# Patient Record
Sex: Female | Born: 1965 | Race: White | Hispanic: No | Smoking: Former smoker
Health system: Southern US, Community
[De-identification: ages and names within clinical notes are randomized; demographics above are authoritative.]

## PROBLEM LIST (undated history)

## (undated) DIAGNOSIS — T451X5A Adverse effect of antineoplastic and immunosuppressive drugs, initial encounter: Secondary | ICD-10-CM

## (undated) DIAGNOSIS — F419 Anxiety disorder, unspecified: Secondary | ICD-10-CM

## (undated) DIAGNOSIS — F32A Depression, unspecified: Secondary | ICD-10-CM

## (undated) DIAGNOSIS — H269 Unspecified cataract: Secondary | ICD-10-CM

## (undated) DIAGNOSIS — M7551 Bursitis of right shoulder: Secondary | ICD-10-CM

## (undated) DIAGNOSIS — Z9221 Personal history of antineoplastic chemotherapy: Secondary | ICD-10-CM

## (undated) DIAGNOSIS — E559 Vitamin D deficiency, unspecified: Secondary | ICD-10-CM

## (undated) DIAGNOSIS — C801 Malignant (primary) neoplasm, unspecified: Secondary | ICD-10-CM

## (undated) DIAGNOSIS — Z170 Estrogen receptor positive status [ER+]: Secondary | ICD-10-CM

## (undated) DIAGNOSIS — C50919 Malignant neoplasm of unspecified site of unspecified female breast: Secondary | ICD-10-CM

## (undated) DIAGNOSIS — K219 Gastro-esophageal reflux disease without esophagitis: Secondary | ICD-10-CM

## (undated) DIAGNOSIS — G4700 Insomnia, unspecified: Secondary | ICD-10-CM

## (undated) DIAGNOSIS — IMO0001 Reserved for inherently not codable concepts without codable children: Secondary | ICD-10-CM

## (undated) DIAGNOSIS — F329 Major depressive disorder, single episode, unspecified: Secondary | ICD-10-CM

## (undated) DIAGNOSIS — M545 Low back pain, unspecified: Secondary | ICD-10-CM

## (undated) DIAGNOSIS — R112 Nausea with vomiting, unspecified: Secondary | ICD-10-CM

## (undated) DIAGNOSIS — C50511 Malignant neoplasm of lower-outer quadrant of right female breast: Secondary | ICD-10-CM

## (undated) DIAGNOSIS — Z923 Personal history of irradiation: Secondary | ICD-10-CM

## (undated) DIAGNOSIS — D649 Anemia, unspecified: Secondary | ICD-10-CM

## (undated) HISTORY — DX: Low back pain, unspecified: M54.50

## (undated) HISTORY — DX: Bursitis of right shoulder: M75.51

## (undated) HISTORY — DX: Vitamin D deficiency, unspecified: E55.9

## (undated) HISTORY — PX: ENDOMETRIAL ABLATION: SHX621

## (undated) HISTORY — DX: Depression, unspecified: F32.A

## (undated) HISTORY — DX: Nausea with vomiting, unspecified: R11.2

## (undated) HISTORY — DX: Estrogen receptor positive status (ER+): C50.511

## (undated) HISTORY — PX: ECTOPIC PREGNANCY SURGERY: SHX613

## (undated) HISTORY — DX: Gastro-esophageal reflux disease without esophagitis: K21.9

## (undated) HISTORY — PX: BILATERAL SALPINGECTOMY: SHX5743

## (undated) HISTORY — DX: Low back pain: M54.5

## (undated) HISTORY — DX: Insomnia, unspecified: G47.00

## (undated) HISTORY — DX: Unspecified cataract: H26.9

## (undated) HISTORY — DX: Malignant neoplasm of lower-outer quadrant of right female breast: Z17.0

## (undated) HISTORY — DX: Major depressive disorder, single episode, unspecified: F32.9

## (undated) HISTORY — DX: Anxiety disorder, unspecified: F41.9

## (undated) HISTORY — PX: TUBAL LIGATION: SHX77

## (undated) HISTORY — DX: Adverse effect of antineoplastic and immunosuppressive drugs, initial encounter: T45.1X5A

---

## 1998-04-07 ENCOUNTER — Other Ambulatory Visit: Admission: RE | Admit: 1998-04-07 | Discharge: 1998-04-07 | Payer: Self-pay | Admitting: Obstetrics & Gynecology

## 2005-07-05 ENCOUNTER — Other Ambulatory Visit: Payer: Self-pay

## 2005-07-05 ENCOUNTER — Emergency Department: Payer: Self-pay | Admitting: Internal Medicine

## 2006-08-22 ENCOUNTER — Encounter (INDEPENDENT_AMBULATORY_CARE_PROVIDER_SITE_OTHER): Payer: Self-pay | Admitting: Internal Medicine

## 2006-08-22 LAB — CONVERTED CEMR LAB

## 2006-09-20 ENCOUNTER — Encounter (INDEPENDENT_AMBULATORY_CARE_PROVIDER_SITE_OTHER): Payer: Self-pay | Admitting: Internal Medicine

## 2006-09-20 LAB — CONVERTED CEMR LAB
RBC count: 4.59 10*6/uL
WBC, blood: 6.6 10*3/uL

## 2006-12-09 ENCOUNTER — Ambulatory Visit: Payer: Self-pay | Admitting: Unknown Physician Specialty

## 2006-12-22 ENCOUNTER — Encounter (INDEPENDENT_AMBULATORY_CARE_PROVIDER_SITE_OTHER): Payer: Self-pay | Admitting: Internal Medicine

## 2006-12-22 LAB — CONVERTED CEMR LAB: Blood Glucose, Fasting: 100 mg/dL

## 2006-12-26 ENCOUNTER — Ambulatory Visit: Payer: Self-pay | Admitting: Family Medicine

## 2007-01-24 ENCOUNTER — Ambulatory Visit: Payer: Self-pay | Admitting: Family Medicine

## 2007-03-13 ENCOUNTER — Ambulatory Visit: Payer: Self-pay | Admitting: Internal Medicine

## 2007-08-16 ENCOUNTER — Ambulatory Visit: Payer: Self-pay | Admitting: Family Medicine

## 2007-08-16 DIAGNOSIS — E66812 Obesity, class 2: Secondary | ICD-10-CM | POA: Insufficient documentation

## 2007-08-16 DIAGNOSIS — G4700 Insomnia, unspecified: Secondary | ICD-10-CM | POA: Insufficient documentation

## 2007-08-16 DIAGNOSIS — E669 Obesity, unspecified: Secondary | ICD-10-CM

## 2007-08-17 ENCOUNTER — Encounter: Payer: Self-pay | Admitting: Internal Medicine

## 2007-08-17 DIAGNOSIS — Z862 Personal history of diseases of the blood and blood-forming organs and certain disorders involving the immune mechanism: Secondary | ICD-10-CM | POA: Insufficient documentation

## 2007-08-17 DIAGNOSIS — N939 Abnormal uterine and vaginal bleeding, unspecified: Secondary | ICD-10-CM

## 2007-08-17 DIAGNOSIS — N926 Irregular menstruation, unspecified: Secondary | ICD-10-CM

## 2007-08-21 ENCOUNTER — Telehealth (INDEPENDENT_AMBULATORY_CARE_PROVIDER_SITE_OTHER): Payer: Self-pay | Admitting: *Deleted

## 2007-08-29 ENCOUNTER — Telehealth (INDEPENDENT_AMBULATORY_CARE_PROVIDER_SITE_OTHER): Payer: Self-pay | Admitting: *Deleted

## 2007-09-18 ENCOUNTER — Encounter (INDEPENDENT_AMBULATORY_CARE_PROVIDER_SITE_OTHER): Payer: Self-pay | Admitting: *Deleted

## 2007-09-27 ENCOUNTER — Ambulatory Visit: Payer: Self-pay | Admitting: Family Medicine

## 2007-11-28 ENCOUNTER — Ambulatory Visit: Payer: Self-pay | Admitting: Family Medicine

## 2007-11-28 DIAGNOSIS — R42 Dizziness and giddiness: Secondary | ICD-10-CM

## 2008-01-11 ENCOUNTER — Ambulatory Visit: Payer: Self-pay | Admitting: Unknown Physician Specialty

## 2008-07-05 ENCOUNTER — Telehealth: Payer: Self-pay | Admitting: Family Medicine

## 2008-11-25 ENCOUNTER — Telehealth (INDEPENDENT_AMBULATORY_CARE_PROVIDER_SITE_OTHER): Payer: Self-pay | Admitting: Internal Medicine

## 2009-02-12 ENCOUNTER — Ambulatory Visit: Payer: Self-pay | Admitting: Unknown Physician Specialty

## 2009-05-27 ENCOUNTER — Emergency Department: Payer: Self-pay | Admitting: Internal Medicine

## 2010-03-06 ENCOUNTER — Ambulatory Visit: Payer: Self-pay | Admitting: Unknown Physician Specialty

## 2010-12-28 ENCOUNTER — Emergency Department: Payer: Self-pay | Admitting: Emergency Medicine

## 2011-04-09 NOTE — Assessment & Plan Note (Signed)
[facility]                           [place] OFFICE NOTE   Shelby, Barry                       MRN:          [id number]  DATE:12/26/2006                            DOB:          [birth year]    Shelby Barry is a 45 year old white female who comes to establish with the  practice due to a history of anemia and shortness of breath for the past  2 weeks.  She indicates that she has had anemia off and on for the past  20 years.   She is seeing Dr. Arvil Chaco and has had increased spotting for the past  7-8 years.  She is scheduled for a Her Option, which is a freezing  procedure of the endometrium of the uterus, in 2 weeks.  It is hoped  that this will stop her continual spotting.  Her next appointment there  is on February 6 in prep for surgery.   She indicates that she has had fatigue, especially on Saturdays and  Sundays of each week.  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  Indicates  that she is not tired when she goes to work on Saturday but is quite  tired by the end of her two shifts.  She also indicates that her weight  is depressing her.  She becomes anxious, has been on Wellbutrin 150 mg  for one month from Dr. Arvil Chaco.  She is unsure of the response.  She  does indicate that with increased stress at work she becomes very  anxious.   She has had no primary care physician with the exception of Dr. Arvil Chaco for a number of years.   CURRENT MEDICATIONS:  1. Fish oil 1000 mg two to three a day.  2. Prenatal vitamin two a day.  3. Iron 65 mg two daily.   ALLERGIES:  No known.   PAST MEDICAL HISTORY:  1. Significant for an abnormal Pap in 1992, at which time she had a      conization and has had a negative Pap recently.  2. Anemia long-term as discussed above.  3. Reflux.   Surgeries other than her conization has been a uterine procedure in  January 2008, she is  not sure what that was, and a different procedure  in 1994.  She had bilateral tubal ligation in September 1986.  She has  been hospitalized for childbirth only.   SOCIAL HISTORY:  She is married and has 2 adult children, ages 11 and  58.  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  She is  no exercise program.  Stopped smoking a year and a half ago.  Uses  alcohol sporadically, which may mean 2-3 mixed drinks on an evening  approximately every 3 months, and occasional beer.  She does not use  street drugs.   REVIEW OF SYSTEMS:  She denies any HEENT, cardiac, GU, allergic rhinitis  and thromboses.  She does wear reading glasses, which are by  prescription.  Her last eye exam was in 2007, at which time she did not  need a new prescription.  She was negative for glaucoma and cataracts.  RESPIRATORY:  She thinks she remembers exercise-induced asthma and  currently becomes short of breath.  GI:  She has reflux and has used OTC  Prilosec 20 mg, which helps.  Zantac helped sometimes but she generally  uses 1-4 Tums daily.  She has never had a workup.  She had had no  endoscopy or colonoscopy.  GYNECOLOGIC:  She is a gravida 3, para 2,  having had a tubal pregnancy post bilateral tubal ligation.  Her last  Pap smear was December 2007 and a mammogram in January 2008, both of  which were negative.  Her pregnancies were vaginal deliveries with no  complications.  MUSCULOSKELETAL:  She indicates no fracture but does see  a chiropractor approximately once a month due to back pain from work.   FAMILY HISTORY:  Father died at age 40 of suicide.  He had known drug  and alcohol abuse, elevated lipids, heart disease, hypertension and  diabetes.  Her mother is living at age 34 with no known medical  problems.  Her maternal grandmother is living at age 35 with some GI  problems but otherwise well.  She has one brother living at age 90, who  has drug and alcohol abuse problems.  She has  two sisters living, one at  age 28 had a myocardial infarction and the other is living and well.   For questions regarding the extended family, I find that her paternal  grandmother had a stroke at age 85.  Her paternal grandmother was also  diabetic, as was a paternal uncle and numerous paternal cousins.  To her  knowledge, there is no cancer in the family.  She does also have a  maternal uncle who has had drug and alcohol problems.   IMMUNIZATION RECORDS:  She is unclear regarding her tetanus and  pneumonia status.  She does know that she has had the hepatitis B  vaccine.   PHYSICAL EXAMINATION:  VITAL SIGNS:  Blood pressure 119/74, temperature  98.0, pulse is 74, weight 193, height 5 feet 1-1/2 inches.  GENERAL:  Obese white female in no acute distress.  CHEST:  Clear throughout.  No rales, rhonchi or wheezes.  HEART:  Rate and rhythm regular without murmurs, gallops or rubs.  No  carotid bruits.  No pretibial edema.  MUSCULOSKELETAL:  Muscle mass is equal, upper and lower extremities.  Gait and station within normal limits.  SKIN:  Without obvious lesions.  PSYCHIATRIC:  Obviously anxious in the exam room and by history.   ASSESSMENT:  1. History of anemia, fatigue and obesity.  Will get her records from      Dr. Arvil Chaco to include labs.  I have asked her to walk 5 times a      week at a slow pace and to reduce her white flour consumption.  2. Anxiety.  Will start her on BuSpar 5 mg one a day.  She will call      response in 2 weeks, and we will titrate upward as needed (#60 with      one refill).  We will see her back in one month.      Billie D. Bean, FNP  Electronically Signed      Arta Silence, MD  Electronically Signed   BDB/MedQ  DD: 12/28/2006  DT: 12/28/2006  Job #: [id number]

## 2011-04-21 ENCOUNTER — Ambulatory Visit: Payer: Self-pay | Admitting: Unknown Physician Specialty

## 2012-02-21 LAB — HM MAMMOGRAPHY: HM MAMMO: NORMAL

## 2012-04-04 ENCOUNTER — Ambulatory Visit: Payer: Self-pay | Admitting: Ophthalmology

## 2013-03-16 LAB — HM PAP SMEAR: HM PAP: NORMAL

## 2013-04-09 ENCOUNTER — Ambulatory Visit: Payer: Self-pay | Admitting: Obstetrics & Gynecology

## 2013-04-09 LAB — CBC
HCT: 40.1 % (ref 35.0–47.0)
RDW: 14 % (ref 11.5–14.5)
WBC: 6.5 10*3/uL (ref 3.6–11.0)

## 2013-04-17 ENCOUNTER — Ambulatory Visit: Payer: Self-pay | Admitting: Obstetrics & Gynecology

## 2013-04-18 LAB — HEMOGLOBIN: HGB: 12.6 g/dL (ref 12.0–16.0)

## 2013-04-18 LAB — PATHOLOGY REPORT

## 2014-04-20 HISTORY — PX: ABDOMINAL HYSTERECTOMY: SHX81

## 2014-11-13 ENCOUNTER — Ambulatory Visit: Payer: Self-pay | Admitting: Ophthalmology

## 2015-03-14 NOTE — Op Note (Signed)
PATIENT NAME:  [name], Shelby Barry MR#:  [id number] DATE OF BIRTH:  [birth year]  DATE OF PROCEDURE:  04/17/2013  PREOPERATIVE DIAGNOSIS: Menorrhagia.  POSTOPERATIVE DIAGNOSES: Menorrhagia and endometriosis.   PROCEDURE: Total laparoscopic hysterectomy with bilateral salpingo-oophorectomy.   SURGEON: Glean Salen, MD  ASSISTANT: Stoney Bang. Georgianne Fick, MD  ANESTHESIA: General.   ESTIMATED BLOOD LOSS: 50 mL.   COMPLICATIONS: None.   FINDINGS: Endometriosis noted around bilateral adnexa and posterior left ovarian fossa and the peritoneal surface.   DISPOSITION: To recovery room in stable condition.   TECHNIQUE: The patient is prepped and draped in the usual sterile fashion after adequate anesthesia is obtained in the dorsal lithotomy position. A bladder catheter is inserted. A VCare device is placed on the cervix for manipulation purposes after uterus is sounded to 8 cm.   Attention is then turned to the abdomen, where a Veress needle is inserted through a 5 mm infraumbilical incision after Marcaine is used to anesthetize the skin. Veress needle placement is confirmed using the hanging drop technique, and the abdomen is then insufflated with CO2 gas. A 5 mm trocar is then inserted under direct visualization with the laparoscope, with no injuries or bleeding noted. The patient is placed in Trendelenburg position, and the above-mentioned findings are visualized.   The left and right trocars were placed, with a 5 mm placed in the left lower quadrant and an 11 mm placed in the right lower quadrant lateral to the inferior epigastric blood vessels, with no injuries or bleeding noted. Adhesions that are on the anterior abdominal wall to the omentum and around the left adnexa are carefully dissected using the 5 mm Harmonic scalpel. There is no apparent injury to bowel, bladder, ureter or other structures, and these are out of harm's way throughout the procedure.   The infundibulopelvic blood vessels and  ligaments are carefully coagulated and cut using the bipolar cautery device as well as the Harmonic scalpel to dissect the uterus, tubes and ovaries free. The broad ligament is dissected down to the level of the uterine arteries, which are carefully coagulated and then cut. The VCare device is identified through the cervicovaginal junction tenting through the tissues, and a circumferential incision is made to completely amputate the uterus and cervix along with the adnexa, and it is removed vaginally.   Vaginal cuff is closed with interrupted Polysorb sutures using the Endo Stitch device, 6 sutures are placed, and excellent closure is noted both visually and by bimanual examination.   Pelvic cavity is irrigated with saline, and all fluid is aspirated. Excellent hemostasis is noted. There were no apparent injuries to bowel, bladder or ureter structures. Gas is expelled. Trocars are removed. The skin is closed with Dermabond. The patient goes to recovery room in stable condition. All sponge, instrument and needle counts are correct.   ____________________________ R. Barnett Applebaum, MD rph:OSi D: 04/17/2013 13:35:21 ET T: 04/17/2013 14:16:50 ET JOB#: [id number]  cc: Glean Salen, MD, <Dictator> Gae Dry MD ELECTRONICALLY SIGNED 04/17/2013 16:24

## 2015-08-22 ENCOUNTER — Encounter: Payer: Self-pay | Admitting: Family Medicine

## 2015-08-22 ENCOUNTER — Ambulatory Visit (INDEPENDENT_AMBULATORY_CARE_PROVIDER_SITE_OTHER): Payer: Managed Care, Other (non HMO) | Admitting: Family Medicine

## 2015-08-22 VITALS — BP 112/68 | HR 108 | Temp 98.1°F | Resp 18 | Ht 62.0 in | Wt 155.2 lb

## 2015-08-22 DIAGNOSIS — M7550 Bursitis of unspecified shoulder: Secondary | ICD-10-CM | POA: Insufficient documentation

## 2015-08-22 DIAGNOSIS — Z23 Encounter for immunization: Secondary | ICD-10-CM

## 2015-08-22 DIAGNOSIS — F332 Major depressive disorder, recurrent severe without psychotic features: Secondary | ICD-10-CM | POA: Insufficient documentation

## 2015-08-22 DIAGNOSIS — F339 Major depressive disorder, recurrent, unspecified: Secondary | ICD-10-CM | POA: Insufficient documentation

## 2015-08-22 DIAGNOSIS — Z131 Encounter for screening for diabetes mellitus: Secondary | ICD-10-CM | POA: Diagnosis not present

## 2015-08-22 DIAGNOSIS — M7551 Bursitis of right shoulder: Secondary | ICD-10-CM | POA: Diagnosis not present

## 2015-08-22 DIAGNOSIS — H269 Unspecified cataract: Secondary | ICD-10-CM | POA: Insufficient documentation

## 2015-08-22 DIAGNOSIS — Z79899 Other long term (current) drug therapy: Secondary | ICD-10-CM

## 2015-08-22 DIAGNOSIS — Z720 Tobacco use: Secondary | ICD-10-CM | POA: Insufficient documentation

## 2015-08-22 DIAGNOSIS — F411 Generalized anxiety disorder: Secondary | ICD-10-CM | POA: Diagnosis not present

## 2015-08-22 DIAGNOSIS — G4700 Insomnia, unspecified: Secondary | ICD-10-CM | POA: Diagnosis not present

## 2015-08-22 DIAGNOSIS — G4726 Circadian rhythm sleep disorder, shift work type: Secondary | ICD-10-CM | POA: Insufficient documentation

## 2015-08-22 DIAGNOSIS — M545 Low back pain, unspecified: Secondary | ICD-10-CM | POA: Insufficient documentation

## 2015-08-22 DIAGNOSIS — Z862 Personal history of diseases of the blood and blood-forming organs and certain disorders involving the immune mechanism: Secondary | ICD-10-CM

## 2015-08-22 DIAGNOSIS — K219 Gastro-esophageal reflux disease without esophagitis: Secondary | ICD-10-CM | POA: Insufficient documentation

## 2015-08-22 DIAGNOSIS — E559 Vitamin D deficiency, unspecified: Secondary | ICD-10-CM | POA: Insufficient documentation

## 2015-08-22 MED ORDER — VARENICLINE TARTRATE 0.5 MG X 11 & 1 MG X 42 PO MISC: ORAL | Status: DC

## 2015-08-22 MED ORDER — MELOXICAM 15 MG PO TABS: 15.0000 mg | ORAL_TABLET | Freq: Every day | ORAL | Status: DC

## 2015-08-22 MED ORDER — ZOLPIDEM TARTRATE 10 MG PO TABS: 10.0000 mg | ORAL_TABLET | Freq: Every day | ORAL | Status: DC

## 2015-08-22 MED ORDER — VARENICLINE TARTRATE 1 MG PO TABS: 1.0000 mg | ORAL_TABLET | Freq: Two times a day (BID) | ORAL | Status: DC

## 2015-08-22 NOTE — Progress Notes (Signed)
Name: Shelby Barry   MRN: [id number]    DOB: Mar 16, 1966   Date:08/22/2015       Progress Note  Subjective  Chief Complaint  Chief Complaint  Patient presents with  . Nicotine Dependence    Started back 4-6 months ago, smoking 1/2 pack a day. Stopped for 10 years and just started back would like to discuss taking medication to help stop smoking.   . Insomnia    Sleeps 3-5 hours, trouble falling asleep.    HPI  Tobacco use: she used to smoke but quit in 2006, and re-started this Summer. She has been dating someone that smokes, and now she is smoking also. She quit in the past with Chantix and would like to resume medication to help her quit again.   Insomnia: she was getting Ambien from GYN, but had hysterectomy and not longer needs to follow up with them. Ambien helps her fall asleep and stay asleep. Denies side effects  Depression/ Anxiety: she has a long history of anxiety and depression, she seldom has crying spells. She denies anhedonia, feeling more anxious than depressed, but does not want medications at this time.   Right shoulder bursitis: symptoms are mild. Pain is intermittent triggered by over activity, like when recently she painted the wall at home. Responds well to Meloxicam   Patient Active Problem List   Diagnosis Date Noted  . Depression, major, recurrent, mild 08/22/2015  . Bursitis of shoulder 08/22/2015  . Bilateral cataracts 08/22/2015  . Circadian rhythm sleep disorder, 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  . Vitamin D deficiency 08/22/2015  . LBP (low back pain) 08/22/2015  . GAD (generalized anxiety disorder) 08/22/2015  . Tobacco use 08/22/2015  . History of anemia 08/17/2007  . Overweight (BMI 25.0-29.9) 08/16/2007  . Insomnia 08/16/2007    Past Surgical History  Procedure Laterality Date  . Endometrial ablation    . Bilateral salpingectomy    . Abdominal hysterectomy  04/20/2014    Family History  Problem Relation Age of Onset  . Depression Father   .  Obesity Father   . Alcohol abuse Brother   . Seizures Brother     Social History   Social History  . Marital Status: Married    Spouse Name: N/A  . Number of Children: N/A  . Years of Education: N/A   Occupational History  . Not on file.   Social History Main Topics  . Smoking status: Not on file  . Smokeless tobacco: Not on file  . Alcohol Use: Not on file  . Drug Use: Not on file  . Sexual Activity: Not on file   Other Topics Concern  . Not on file   Social History Narrative     Current outpatient prescriptions:  .  meloxicam (MOBIC) 15 MG tablet, Take 1 tablet (15 mg total) by mouth daily., Disp: 30 tablet, Rfl: 2 .  MULTIPLE VITAMINS PO, Take by mouth., Disp: , Rfl:  .  Omega-3 Fatty Acids (FISH OIL) 1000 MG CAPS, Take 1 capsule by mouth once., Disp: , Rfl:  .  zolpidem (AMBIEN) 10 MG tablet, Take 1 tablet (10 mg total) by mouth at bedtime., Disp: 30 tablet, Rfl: 2 .  cholecalciferol (VITAMIN D) 1000 UNITS tablet, Take by mouth., Disp: , Rfl:  .  varenicline (CHANTIX CONTINUING MONTH PAK) 1 MG tablet, Take 1 tablet (1 mg total) by mouth 2 (two) times daily., Disp: 60 tablet, Rfl: 2 .  varenicline (CHANTIX PAK) 0.5 MG X 11 &  1 MG X 42 tablet, Take one 0.5 mg tablet by mouth once daily for 3 days, then increase to one 0.5 mg tablet twice daily for 4 days, then increase to one 1 mg tablet twice daily., Disp: 53 tablet, Rfl: 0  No Known Allergies   ROS  Constitutional: Negative for fever or weight change.  Respiratory: Negative for cough and shortness of breath.  Hoarseness - just today Cardiovascular: Negative for chest pain or palpitations.  Gastrointestinal: Negative for abdominal pain, no bowel changes.  Musculoskeletal: Negative for gait problem or joint swelling.  Skin: Negative for rash.  Neurological: Negative for dizziness or headache.  No other specific complaints in a complete review of systems (except as listed in HPI above).  Objective  Filed  Vitals:   08/22/15 1440  BP: 112/68  Pulse: 108  Temp: 98.1 F (36.7 C)  TempSrc: Oral  Resp: 18  Height: 5\' 2"  (1.575 m)  Weight: 155 lb 3.2 oz (70.398 kg)  SpO2: 97%    Body mass index is 28.38 kg/(m^2).  Physical Exam  Constitutional: Patient appears well-developed and well-nourished. Obese No distress.  HEENT: head atraumatic, normocephalic, pupils equal and reactive to light,  neck supple, throat within normal limits Cardiovascular: Normal rate, regular rhythm and normal heart sounds.  No murmur heard. No BLE edema. Pulmonary/Chest: Effort normal and breath sounds normal. No respiratory distress. Abdominal: Soft.  There is no tenderness. Psychiatric: Patient has a normal mood and affect. behavior is normal. Judgment and thought content normal.   PHQ2/9: Depression screen PHQ 2/9 08/22/2015  Decreased Interest 0  Down, Depressed, Hopeless 0  PHQ - 2 Score 0     Fall Risk: Fall Risk  08/22/2015  Falls in the past year? No      Functional Status Survey: Is the patient deaf or have difficulty hearing?: No Does the patient have difficulty seeing, even when wearing glasses/contacts?: Yes (glasses) Does the patient have difficulty concentrating, remembering, or making decisions?: No Does the patient have difficulty walking or climbing stairs?: No Does the patient have difficulty dressing or bathing?: No Does the patient have difficulty doing errands alone such as visiting a doctor's office or shopping?: No    Assessment & Plan  1. Insomnia  Doing well on medication  - zolpidem (AMBIEN) 10 MG tablet; Take 1 tablet (10 mg total) by mouth at bedtime.  Dispense: 30 tablet; Refill: 2  2. Needs flu shot  - Flu Vaccine QUAD 36+ mos PF IM (Fluarix & Fluzone Quad PF)  3. History of anemia  - CBC with Differential/Platelet - Ferritin  4. GAD (generalized anxiety disorder)  She wants to stop smoking first, and will return to discussed GAD medication next  visit  5. Vitamin D deficiency  - Vit D  25 hydroxy (rtn osteoporosis monitoring)  6. Tobacco use  Discussed possible side effects - varenicline (CHANTIX CONTINUING MONTH PAK) 1 MG tablet; Take 1 tablet (1 mg total) by mouth 2 (two) times daily.  Dispense: 60 tablet; Refill: 2 - varenicline (CHANTIX PAK) 0.5 MG X 11 & 1 MG X 42 tablet; Take one 0.5 mg tablet by mouth once daily for 3 days, then increase to one 0.5 mg tablet twice daily for 4 days, then increase to one 1 mg tablet twice daily.  Dispense: 53 tablet; Refill: 0  7. Bursitis of shoulder, right  - meloxicam (MOBIC) 15 MG tablet; Take 1 tablet (15 mg total) by mouth daily.  Dispense: 30 tablet; Refill: 2  8. Diabetes mellitus screening  - Hemoglobin A1c  9. Long-term use of high-risk medication  - Comprehensive metabolic panel

## 2015-11-21 ENCOUNTER — Ambulatory Visit: Payer: Managed Care, Other (non HMO) | Admitting: Family Medicine

## 2016-04-28 ENCOUNTER — Ambulatory Visit (INDEPENDENT_AMBULATORY_CARE_PROVIDER_SITE_OTHER): Payer: Managed Care, Other (non HMO) | Admitting: Family Medicine

## 2016-04-28 ENCOUNTER — Encounter: Payer: Self-pay | Admitting: Family Medicine

## 2016-04-28 VITALS — BP 118/76 | HR 79 | Temp 97.9°F | Resp 16 | Ht 62.0 in | Wt 170.5 lb

## 2016-04-28 DIAGNOSIS — Z113 Encounter for screening for infections with a predominantly sexual mode of transmission: Secondary | ICD-10-CM

## 2016-04-28 DIAGNOSIS — Z114 Encounter for screening for human immunodeficiency virus [HIV]: Secondary | ICD-10-CM

## 2016-04-28 DIAGNOSIS — Z1239 Encounter for other screening for malignant neoplasm of breast: Secondary | ICD-10-CM

## 2016-04-28 DIAGNOSIS — E559 Vitamin D deficiency, unspecified: Secondary | ICD-10-CM

## 2016-04-28 DIAGNOSIS — Z1211 Encounter for screening for malignant neoplasm of colon: Secondary | ICD-10-CM | POA: Diagnosis not present

## 2016-04-28 DIAGNOSIS — Z862 Personal history of diseases of the blood and blood-forming organs and certain disorders involving the immune mechanism: Secondary | ICD-10-CM | POA: Diagnosis not present

## 2016-04-28 DIAGNOSIS — G4700 Insomnia, unspecified: Secondary | ICD-10-CM | POA: Diagnosis not present

## 2016-04-28 DIAGNOSIS — Z0000 Encounter for general adult medical examination without abnormal findings: Secondary | ICD-10-CM

## 2016-04-28 DIAGNOSIS — Z124 Encounter for screening for malignant neoplasm of cervix: Secondary | ICD-10-CM

## 2016-04-28 DIAGNOSIS — Z01419 Encounter for gynecological examination (general) (routine) without abnormal findings: Secondary | ICD-10-CM

## 2016-04-28 DIAGNOSIS — M7551 Bursitis of right shoulder: Secondary | ICD-10-CM

## 2016-04-28 DIAGNOSIS — R05 Cough: Secondary | ICD-10-CM | POA: Diagnosis not present

## 2016-04-28 DIAGNOSIS — R635 Abnormal weight gain: Secondary | ICD-10-CM | POA: Diagnosis not present

## 2016-04-28 DIAGNOSIS — R059 Cough, unspecified: Secondary | ICD-10-CM

## 2016-04-28 MED ORDER — MELOXICAM 15 MG PO TABS: 15.0000 mg | ORAL_TABLET | Freq: Every day | ORAL | Status: DC

## 2016-04-28 MED ORDER — ZOLPIDEM TARTRATE 10 MG PO TABS: 10.0000 mg | ORAL_TABLET | Freq: Every day | ORAL | Status: DC

## 2016-04-28 MED ORDER — PREDNISONE 20 MG PO TABS
20.0000 mg | ORAL_TABLET | Freq: Every day | ORAL | Status: DC
Start: 2016-04-28 — End: 2016-10-06

## 2016-04-28 NOTE — Progress Notes (Signed)
Name: Shelby Barry   MRN: [id number]    DOB: 19-Oct-1966   Date:04/28/2016       Progress Note  Subjective  Chief Complaint  Chief Complaint  Patient presents with  . Annual Exam  . URI    productive cough with laryngitis  . Medication Refill    mobic and ambien  . Labs Only  . Orders    mammogram & colonoscopy  . Immunizations    PCV 58    HPI  Well Woman: new sexual for the past 15 months, and would like to be checked for STI, she denies vaginal discharge or itching. She had a hysterectomy for bleeding, not cancer. No breast problems or urinary symptoms.   Cough: she quit smoking Dec 2016, she has noticed a productive cough for the past couple of months, but it was getting worse and productive, so she called [facility] and was given Augmentin, last week but symptoms are still present. She denies wheezing SOB or fever. She also has hoarseness.   Insomnia: she would like a refill of Ambien, she has been out of medication and has difficulty falling and staying asleep  Bursitis/ intermittent shoulder pain: secondary to repetitive motion at work, no redness or increase in warmth, she would like a refill of Meloxicam to take prn. Advised not to take with prednisone   Patient Active Problem List   Diagnosis Date Noted  . Depression, major, recurrent, mild ([place]) 08/22/2015  . Bursitis of shoulder 08/22/2015  . Bilateral cataracts 08/22/2015  . Circadian rhythm sleep disorder, 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  . Vitamin D deficiency 08/22/2015  . LBP (low back pain) 08/22/2015  . GAD (generalized anxiety disorder) 08/22/2015  . Tobacco use 08/22/2015  . History of anemia 08/17/2007  . Overweight (BMI 25.0-29.9) 08/16/2007  . Insomnia 08/16/2007    Past Surgical History  Procedure Laterality Date  . Endometrial ablation    . Bilateral salpingectomy    . Abdominal hysterectomy  04/20/2014    Family History  Problem Relation Age of Onset  . Depression Father   . Obesity Father   .  Alcohol abuse Brother   . Seizures Brother     Social History   Social History  . Marital Status: Married    Spouse Name: N/A  . Number of Children: N/A  . Years of Education: N/A   Occupational History  . Not on file.   Social History Main Topics  . Smoking status: Former Smoker -- 1.00 packs/day for 20 years    Types: Cigarettes    Quit date: 11/21/2015  . Smokeless tobacco: Never Used  . Alcohol Use: 0.0 oz/week    0 Standard drinks or equivalent per week     Comment: occassional  . Drug Use: No  . Sexual Activity:    Partners: Male   Other Topics Concern  . Not on file   Social History Narrative     Current outpatient prescriptions:  Marland Kitchen  MULTIPLE VITAMINS PO, Take by mouth., Disp: , Rfl:  .  Omega-3 Fatty Acids (FISH OIL) 1000 MG CAPS, Take 1 capsule by mouth once., Disp: , Rfl:  .  cholecalciferol (VITAMIN D) 1000 UNITS tablet, Take by mouth. Reported on 04/28/2016, Disp: , Rfl:  .  meloxicam (MOBIC) 15 MG tablet, Take 1 tablet (15 mg total) by mouth daily., Disp: 30 tablet, Rfl: 2 .  zolpidem (AMBIEN) 10 MG tablet, Take 1 tablet (10 mg total) by mouth at bedtime., Disp: 30 tablet,  Rfl: 2  No Known Allergies   ROS  Constitutional: Negative for fever , positive for  weight change  gain.  Respiratory: Positive for cough , but no shortness of breath.   Cardiovascular: Negative for chest pain or palpitations.  Gastrointestinal: Negative for abdominal pain, no bowel changes.  Musculoskeletal: Negative for gait problem or joint swelling.  Skin: Negative for rash.  Neurological: Negative for dizziness or headache.  No other specific complaints in a complete review of systems (except as listed in HPI above).  Objective  Filed Vitals:   04/28/16 1346  BP: 118/76  Pulse: 79  Temp: 97.9 F (36.6 C)  TempSrc: Oral  Resp: 16  Height: 5\' 2"  (1.575 m)  Weight: 170 lb 8 oz (77.338 kg)  SpO2: 96%    Body mass index is 31.18 kg/(m^2).  Physical  Exam  Constitutional: Patient appears well-developed and well-nourished. No distress.  HENT: Head: Normocephalic and atraumatic. Ears: B TMs ok, no erythema or effusion; Nose: Nose normal. Mouth/Throat: Oropharynx is clear and moist. No oropharyngeal exudate.  Eyes: Conjunctivae and EOM are normal. Pupils are equal, round, and reactive to light. No scleral icterus.  Neck: Normal range of motion. Neck supple. No JVD present. No thyromegaly present.  Cardiovascular: Normal rate, regular rhythm and normal heart sounds.  No murmur heard. No BLE edema. Pulmonary/Chest: Effort normal and breath sounds normal. No respiratory distress. Abdominal: Soft. Bowel sounds are normal, no distension. There is no tenderness. no masses Breast: no lumps or masses, no nipple discharge or rashes FEMALE GENITALIA:  External genitalia normal External urethra normal Vaginal vault normal without discharge or lesions Cervix absent Bimanual exam normal without masses RECTAL: not done Musculoskeletal: Normal range of motion, no joint effusions. No gross deformities Neurological: he is alert and oriented to person, place, and time. No cranial nerve deficit. Coordination, balance, strength, speech and gait are normal.  Skin: Skin is warm and dry. No rash noted. No erythema.  Psychiatric: Patient has a normal mood and affect. behavior is normal. Judgment and thought content normal.  PHQ2/9: Depression screen [facility] 2/9 04/28/2016 08/22/2015  Decreased Interest 0 0  Down, Depressed, Hopeless 0 0  PHQ - 2 Score 0 0     Fall Risk: Fall Risk  04/28/2016 08/22/2015  Falls in the past year? No No     Functional Status Survey: Is the patient deaf or have difficulty hearing?: Yes (due to recent illnesses) Does the patient have difficulty seeing, even when wearing glasses/contacts?: No Does the patient have difficulty concentrating, remembering, or making decisions?: No Does the patient have difficulty walking or climbing  stairs?: No Does the patient have difficulty dressing or bathing?: No Does the patient have difficulty doing errands alone such as visiting a doctor's office or shopping?: No    Assessment & Plan   1. Well woman exam  Discussed importance of 150 minutes of physical activity weekly, eat two servings of fish weekly, eat one serving of tree nuts ( cashews, pistachios, pecans, almonds.Marland Kitchen) every other day, eat 6 servings of fruit/vegetables daily and drink plenty of water and avoid sweet beverages.   - CBC with Differential/Platelet - Comprehensive metabolic panel - Hemoglobin A1c - Vitamin B12 - TSH - Lipid panel - VITAMIN D 25 Hydroxy (Vit-D Deficiency, Fractures)  2. Cervical cancer screening  She is status post-hysterectomy for benign causes no need for pap smears  3. Colon cancer screening  - Ambulatory referral to Gastroenterology  4. Insomnia  - zolpidem (AMBIEN) 10  MG tablet; Take 1 tablet (10 mg total) by mouth at bedtime.  Dispense: 30 tablet; Refill: 2  5. Bursitis of shoulder, right  - meloxicam (MOBIC) 15 MG tablet; Take 1 tablet (15 mg total) by mouth daily.  Dispense: 30 tablet; Refill: 2  6. History of anemia  Recheck CBC  7. Vitamin D deficiency  - VITAMIN D 25 Hydroxy (Vit-D Deficiency, Fractures)  8. Breast cancer screening  - MM Digital Screening; Future  9. Encounter for screening for HIV  - HIV antibody (with reflex)  10. Routine screening for STI (sexually transmitted infection)  - RPR - Chlamydia/Gonococcus/Trichomonas, NAA  11. Cough  - DG Chest 2 View; Future - predniSONE (DELTASONE) 20 MG tablet; Take 1 tablet (20 mg total) by mouth daily with breakfast.  Dispense: 10 tablet; Refill: 0  12. Weight gain  - TSH

## 2016-05-02 LAB — CHLAMYDIA/GONOCOCCUS/TRICHOMONAS, NAA
Chlamydia by NAA: NEGATIVE
Gonococcus by NAA: NEGATIVE
Trich vag by NAA: NEGATIVE

## 2016-05-05 ENCOUNTER — Telehealth: Payer: Self-pay

## 2016-05-05 ENCOUNTER — Other Ambulatory Visit: Payer: Self-pay

## 2016-05-05 NOTE — Telephone Encounter (Signed)
Gastroenterology Pre-Procedure Review  Request Date: 05/21/16 Requesting Physician: Dr. Ancil Boozer  PATIENT REVIEW QUESTIONS: The patient responded to the following health history questions as indicated:    1. Are you having any GI issues? no 2. Do you have a personal history of Polyps? no 3. Do you have a family history of Colon Cancer or Polyps? no 4. Diabetes Mellitus? no 5. Joint replacements in the past 12 months?no 6. Major health problems in the past 3 months?no 7. Any artificial heart valves, MVP, or defibrillator?no    MEDICATIONS & ALLERGIES:    Patient reports the following regarding taking any anticoagulation/antiplatelet therapy:   Plavix, Coumadin, Eliquis, Xarelto, Lovenox, Pradaxa, Brilinta, or Effient? no Aspirin? no  Patient confirms/reports the following medications:  Current Outpatient Prescriptions  Medication Sig Dispense Refill  . cholecalciferol (VITAMIN D) 1000 UNITS tablet Take by mouth. Reported on 04/28/2016    . meloxicam (MOBIC) 15 MG tablet Take 1 tablet (15 mg total) by mouth daily. 30 tablet 2  . MULTIPLE VITAMINS PO Take by mouth.    . Omega-3 Fatty Acids (FISH OIL) 1000 MG CAPS Take 1 capsule by mouth once.    . predniSONE (DELTASONE) 20 MG tablet Take 1 tablet (20 mg total) by mouth daily with breakfast. 10 tablet 0  . zolpidem (AMBIEN) 10 MG tablet Take 1 tablet (10 mg total) by mouth at bedtime. 30 tablet 2   No current facility-administered medications for this visit.    Patient confirms/reports the following allergies:  No Known Allergies  No orders of the defined types were placed in this encounter.    AUTHORIZATION INFORMATION Primary Insurance: 1D#: Group #:  Secondary Insurance: 1D#: Group #:  SCHEDULE INFORMATION: Date: 05/21/16 Time: Location: [place]

## 2016-05-19 ENCOUNTER — Other Ambulatory Visit: Payer: Self-pay

## 2016-05-19 MED ORDER — PEG 3350-KCL-NABCB-NACL-NASULF 236 G PO SOLR: 4000.0000 mL | Freq: Once | ORAL | Status: DC

## 2016-05-19 NOTE — Discharge Instructions (Signed)

## 2016-05-20 ENCOUNTER — Encounter
Admission: RE | Disposition: A | Discharge status: Home / Self Care | Payer: Self-pay | Source: Ambulatory Visit | Attending: Gastroenterology

## 2016-05-20 ENCOUNTER — Ambulatory Visit: Payer: Managed Care, Other (non HMO) | Admitting: Anesthesiology

## 2016-05-20 ENCOUNTER — Ambulatory Visit
Admission: RE | Admit: 2016-05-20 | Discharge: 2016-05-20 | Disposition: A | Discharge status: Home / Self Care | Payer: Managed Care, Other (non HMO) | Source: Ambulatory Visit | Attending: Gastroenterology | Admitting: Gastroenterology

## 2016-05-20 ENCOUNTER — Encounter: Payer: Self-pay | Admitting: *Deleted

## 2016-05-20 ENCOUNTER — Other Ambulatory Visit: Payer: Self-pay

## 2016-05-20 DIAGNOSIS — F17210 Nicotine dependence, cigarettes, uncomplicated: Secondary | ICD-10-CM | POA: Insufficient documentation

## 2016-05-20 DIAGNOSIS — F419 Anxiety disorder, unspecified: Secondary | ICD-10-CM | POA: Insufficient documentation

## 2016-05-20 DIAGNOSIS — Z79899 Other long term (current) drug therapy: Secondary | ICD-10-CM | POA: Insufficient documentation

## 2016-05-20 DIAGNOSIS — K219 Gastro-esophageal reflux disease without esophagitis: Secondary | ICD-10-CM | POA: Insufficient documentation

## 2016-05-20 DIAGNOSIS — Z1211 Encounter for screening for malignant neoplasm of colon: Secondary | ICD-10-CM | POA: Diagnosis present

## 2016-05-20 DIAGNOSIS — M7551 Bursitis of right shoulder: Secondary | ICD-10-CM

## 2016-05-20 DIAGNOSIS — Z9079 Acquired absence of other genital organ(s): Secondary | ICD-10-CM | POA: Diagnosis present

## 2016-05-20 DIAGNOSIS — G4700 Insomnia, unspecified: Secondary | ICD-10-CM | POA: Diagnosis not present

## 2016-05-20 DIAGNOSIS — K641 Second degree hemorrhoids: Secondary | ICD-10-CM | POA: Insufficient documentation

## 2016-05-20 DIAGNOSIS — F329 Major depressive disorder, single episode, unspecified: Secondary | ICD-10-CM | POA: Diagnosis not present

## 2016-05-20 DIAGNOSIS — Z90710 Acquired absence of both cervix and uterus: Secondary | ICD-10-CM | POA: Diagnosis present

## 2016-05-20 HISTORY — DX: Reserved for inherently not codable concepts without codable children: IMO0001

## 2016-05-20 HISTORY — PX: COLONOSCOPY WITH PROPOFOL: SHX5780

## 2016-05-20 SURGERY — COLONOSCOPY WITH PROPOFOL
Anesthesia: Monitor Anesthesia Care | Wound class: Contaminated

## 2016-05-20 MED ORDER — LIDOCAINE HCL (CARDIAC) 20 MG/ML IV SOLN
INTRAVENOUS | Status: DC | PRN
  Administered 2016-05-20: 40 mg via INTRAVENOUS

## 2016-05-20 MED ORDER — PROPOFOL 10 MG/ML IV BOLUS
INTRAVENOUS | Status: DC | PRN
  Administered 2016-05-20: 20 mg via INTRAVENOUS
  Administered 2016-05-20: 70 mg via INTRAVENOUS
  Administered 2016-05-20: 20 mg via INTRAVENOUS
  Administered 2016-05-20: 30 mg via INTRAVENOUS
  Administered 2016-05-20 (×2): 20 mg via INTRAVENOUS

## 2016-05-20 MED ORDER — SIMETHICONE 40 MG/0.6ML PO SUSP
ORAL | Status: DC | PRN
  Administered 2016-05-20: 12:00:00

## 2016-05-20 MED ORDER — ACETAMINOPHEN 325 MG PO TABS
325.0000 mg | ORAL_TABLET | ORAL | Status: DC | PRN
Start: 2016-05-20 — End: 2016-05-20

## 2016-05-20 MED ORDER — ACETAMINOPHEN 160 MG/5ML PO SOLN
325.0000 mg | ORAL | Status: DC | PRN
Start: 2016-05-20 — End: 2016-05-20

## 2016-05-20 MED ORDER — LACTATED RINGERS IV SOLN
INTRAVENOUS | Status: DC
  Administered 2016-05-20: 11:00:00 via INTRAVENOUS

## 2016-05-20 SURGICAL SUPPLY — 23 items
CANISTER SUCT 1200ML W/VALVE (MISCELLANEOUS) ×3 IMPLANT
CLIP HMST 235XBRD CATH ROT (MISCELLANEOUS) IMPLANT
CLIP RESOLUTION 360 11X235 (MISCELLANEOUS)
FCP ESCP3.2XJMB 240X2.8X (MISCELLANEOUS)
FORCEPS BIOP RAD 4 LRG CAP 4 (CUTTING FORCEPS) IMPLANT
FORCEPS BIOP RJ4 240 W/NDL (MISCELLANEOUS)
FORCEPS ESCP3.2XJMB 240X2.8X (MISCELLANEOUS) IMPLANT
GOWN CVR UNV OPN BCK APRN NK (MISCELLANEOUS) ×2 IMPLANT
GOWN ISOL THUMB LOOP REG UNIV (MISCELLANEOUS) ×4
INJECTOR VARIJECT VIN23 (MISCELLANEOUS) IMPLANT
KIT DEFENDO VALVE AND CONN (KITS) IMPLANT
KIT ENDO PROCEDURE OLY (KITS) ×3 IMPLANT
MARKER SPOT ENDO TATTOO 5ML (MISCELLANEOUS) IMPLANT
PAD GROUND ADULT SPLIT (MISCELLANEOUS) IMPLANT
PROBE APC STR FIRE (PROBE) IMPLANT
RETRIEVER NET ROTH 2.5X230 LF (MISCELLANEOUS) ×3 IMPLANT
SNARE SHORT THROW 13M SML OVAL (MISCELLANEOUS) IMPLANT
SNARE SHORT THROW 30M LRG OVAL (MISCELLANEOUS) IMPLANT
SNARE SNG USE RND 15MM (INSTRUMENTS) IMPLANT
SPOT EX ENDOSCOPIC TATTOO (MISCELLANEOUS)
TRAP ETRAP POLY (MISCELLANEOUS) IMPLANT
VARIJECT INJECTOR VIN23 (MISCELLANEOUS)
WATER STERILE IRR 250ML POUR (IV SOLUTION) ×3 IMPLANT

## 2016-05-20 NOTE — Anesthesia Postprocedure Evaluation (Signed)
Anesthesia Post Note  [name]  Procedure(s) Performed: Procedure(s) (LRB): COLONOSCOPY WITH PROPOFOL (N/A)  Patient location during evaluation: PACU Anesthesia Type: MAC Level of consciousness: awake and alert and oriented Pain management: satisfactory to patient Vital Signs Assessment: post-procedure vital signs reviewed and stable Respiratory status: spontaneous breathing, nonlabored ventilation and respiratory function stable Cardiovascular status: blood pressure returned to baseline and stable Postop Assessment: Adequate PO intake and No signs of nausea or vomiting Anesthetic complications: no    Raliegh Ip

## 2016-05-20 NOTE — Anesthesia Procedure Notes (Signed)
Procedure Name: MAC Date/Time: 05/20/2016 11:48 AM Performed by: Cameron Ali Pre-anesthesia Checklist: Patient identified, Emergency Drugs available, Suction available, Timeout performed and Patient being monitored Patient Re-evaluated:Patient Re-evaluated prior to inductionOxygen Delivery Method: Nasal cannula Placement Confirmation: positive ETCO2

## 2016-05-20 NOTE — H&P (Signed)
Lucilla Lame, MD [address] [place], [place] [address] Phone: [phone] Fax : [phone]  Primary Care Physician:  Loistine Chance, MD Primary Gastroenterologist:  Dr. Allen Norris  Pre-Procedure History & Physical: HPI:  Shelby Barry is a 50 y.o. female is here for a screening colonoscopy.   Past Medical History  Diagnosis Date  . Lumbago   . Insomnia   . Cataract, bilateral   . Bursitis of right shoulder   . Anxiety   . Depression   . Vitamin D deficiency   . Cold     states she has had it for 2 months  . GERD (gastroesophageal reflux disease)     uses baking soda for symptoms    Past Surgical History  Procedure Laterality Date  . Endometrial ablation    . Bilateral salpingectomy    . Abdominal hysterectomy  04/20/2014    Prior to Admission medications   Medication Sig Start Date End Date Taking? Authorizing Provider  MULTIPLE VITAMINS PO Take by mouth.   Yes Historical Provider, MD  Omega-3 Fatty Acids (FISH OIL) 1000 MG CAPS Take 1 capsule by mouth once.   Yes Historical Provider, MD  polyethylene glycol (GOLYTELY) 236 g solution Take 4,000 mLs by mouth once. Drink one 8 oz glass every 20 mins until stools are clear. 05/19/16  Yes Lucilla Lame, MD  zolpidem (AMBIEN) 10 MG tablet Take 1 tablet (10 mg total) by mouth at bedtime. 04/28/16  Yes Steele Sizer, MD  cholecalciferol (VITAMIN D) 1000 UNITS tablet Take by mouth. Reported on 04/28/2016    Historical Provider, MD  meloxicam (MOBIC) 15 MG tablet Take 1 tablet (15 mg total) by mouth daily. 04/28/16   Steele Sizer, MD  predniSONE (DELTASONE) 20 MG tablet Take 1 tablet (20 mg total) by mouth daily with breakfast. Patient not taking: Reported on 05/18/2016 04/28/16   Steele Sizer, MD    Allergies as of 05/05/2016  . (No Known Allergies)    Family History  Problem Relation Age of Onset  . Depression Father   . Obesity Father   . Alcohol abuse Brother   . Seizures Brother     Social History    Social History  . Marital Status: Married    Spouse Name: N/A  . Number of Children: N/A  . Years of Education: N/A   Occupational History  . Not on file.   Social History Main Topics  . Smoking status: Current Every Day Smoker -- 0.50 packs/day for 20 years    Types: Cigarettes    Last Attempt to Quit: 11/21/2015  . Smokeless tobacco: Never Used  . Alcohol Use: 0.0 oz/week    0 Standard drinks or equivalent per week     Comment: occassional  . Drug Use: No  . Sexual Activity:    Partners: Male   Other Topics Concern  . Not on file   Social History Narrative    Review of Systems: See HPI, otherwise negative ROS  Physical Exam: BP 134/78 mmHg  Pulse 80  Temp(Src) 98.1 F (36.7 C)  Resp 16  Ht 5\' 2"  (1.575 m)  Wt 170 lb (77.111 kg)  BMI 31.09 kg/m2  SpO2 96% General:   Alert,  pleasant and cooperative in NAD Head:  Normocephalic and atraumatic. Neck:  Supple; no masses or thyromegaly. Lungs:  Clear throughout to auscultation.    Heart:  Regular rate and rhythm. Abdomen:  Soft, nontender and nondistended. Normal bowel sounds, without guarding, and without  rebound.   Neurologic:  Alert and  oriented x4;  grossly normal neurologically.  Impression/Plan: Shelby Barry is now here to undergo a screening colonoscopy.  Risks, benefits, and alternatives regarding colonoscopy have been reviewed with the patient.  Questions have been answered.  All parties agreeable.

## 2016-05-20 NOTE — Op Note (Signed)
[facility] Patient Name: Shelby Barry Procedure Date: 05/20/2016 11:36 AM MRN: [id number] Account #: [id number] Date of Birth: 1966/07/26 Admit Type: Outpatient Age: 50 Room: [facility] OR ROOM 01 Gender: Female Note Status: Finalized Procedure:            Colonoscopy Indications:          Screening for colorectal malignant neoplasm Providers:            Lucilla Lame, MD Referring MD:         Bethena Roys. Sowles, MD (Referring MD) Medicines:            Propofol per Anesthesia Complications:        No immediate complications. Procedure:            Pre-Anesthesia Assessment:                       - Prior to the procedure, a History and Physical was                        performed, and patient medications and allergies were                        reviewed. The patient's tolerance of previous                        anesthesia was also reviewed. The risks and benefits of                        the procedure and the sedation options and risks were                        discussed with the patient. All questions were                        answered, and informed consent was obtained. Prior                        Anticoagulants: The patient has taken no previous                        anticoagulant or antiplatelet agents. ASA Grade                        Assessment: II - A patient with mild systemic disease.                        After reviewing the risks and benefits, the patient was                        deemed in satisfactory condition to undergo the                        procedure.                       After obtaining informed consent, the colonoscope was                        passed under direct vision. Throughout the procedure,  the patient's blood pressure, pulse, and oxygen                        saturations were monitored continuously. The Olympus                        CF-HQ190L Colonoscope (S#. [phone]) was introduced                    through the anus and advanced to the the cecum,                        identified by appendiceal orifice and ileocecal valve.                        The colonoscopy was performed without difficulty. The                        patient tolerated the procedure well. The quality of                        the bowel preparation was excellent. Findings:      The perianal and digital rectal examinations were normal.      Non-bleeding internal hemorrhoids were found during retroflexion. The       hemorrhoids were Grade II (internal hemorrhoids that prolapse but reduce       spontaneously). Impression:           - Non-bleeding internal hemorrhoids.                       - No specimens collected. Recommendation:       - Repeat colonoscopy in 10 years for screening unless                        any change in family history or lower GI problems. Procedure Code(s):    --- Professional ---                       [phone], Colonoscopy, flexible; diagnostic, including                        collection of specimen(s) by brushing or washing, when                        performed (separate procedure) Diagnosis Code(s):    --- Professional ---                       Z12.11, Encounter for screening for malignant neoplasm                        of colon CPT copyright 2016 American Medical Association. All rights reserved. The codes documented in this report are preliminary and upon coder review may  be revised to meet current compliance requirements. Lucilla Lame, MD 05/20/2016 12:05:12 PM This report has been signed electronically. Number of Addenda: 0 Note Initiated On: 05/20/2016 11:36 AM Scope Withdrawal Time: 0 hours 7 minutes 13 seconds  Total Procedure Duration: 0 hours 10 minutes 15 seconds       [facility]

## 2016-05-20 NOTE — Transfer of Care (Signed)
Immediate Anesthesia Transfer of Care Note  [name]  Procedure(s) Performed: Procedure(s): COLONOSCOPY WITH PROPOFOL (N/A)  Patient Location: PACU  Anesthesia Type: MAC  Level of Consciousness: awake, alert  and patient cooperative  Airway and Oxygen Therapy: Patient Spontanous Breathing and Patient connected to supplemental oxygen  Post-op Assessment: Post-op Vital signs reviewed, Patient's Cardiovascular Status Stable, Respiratory Function Stable, Patent Airway and No signs of Nausea or vomiting  Post-op Vital Signs: Reviewed and stable  Complications: No apparent anesthesia complications

## 2016-05-20 NOTE — Telephone Encounter (Signed)
Patient requesting refill. 

## 2016-05-20 NOTE — Anesthesia Preprocedure Evaluation (Signed)
Anesthesia Evaluation  Patient identified by MRN, date of birth, ID band  Reviewed: Allergy & Precautions, H&P , NPO status , Patient's Chart, lab work & pertinent test results  Airway Mallampati: II  TM Distance: >3 FB Neck ROM: full    Dental no notable dental hx.    Pulmonary Current Smoker,    Pulmonary exam normal        Cardiovascular  Rhythm:regular Rate:Normal     Neuro/Psych    GI/Hepatic GERD  ,  Endo/Other    Renal/GU      Musculoskeletal   Abdominal   Peds  Hematology   Anesthesia Other Findings   Reproductive/Obstetrics                             Anesthesia Physical Anesthesia Plan  ASA: II  Anesthesia Plan: MAC   Post-op Pain Management:    Induction:   Airway Management Planned:   Additional Equipment:   Intra-op Plan:   Post-operative Plan:   Informed Consent: I have reviewed the patients History and Physical, chart, labs and discussed the procedure including the risks, benefits and alternatives for the proposed anesthesia with the patient or authorized representative who has indicated his/her understanding and acceptance.     Plan Discussed with: CRNA  Anesthesia Plan Comments:         Anesthesia Quick Evaluation

## 2016-05-21 ENCOUNTER — Encounter: Payer: Self-pay | Admitting: Gastroenterology

## 2016-05-24 ENCOUNTER — Other Ambulatory Visit: Payer: Self-pay

## 2016-05-24 DIAGNOSIS — M7551 Bursitis of right shoulder: Secondary | ICD-10-CM

## 2016-05-24 DIAGNOSIS — G4700 Insomnia, unspecified: Secondary | ICD-10-CM

## 2016-05-24 NOTE — Telephone Encounter (Signed)
Patient requesting refill. 

## 2016-06-09 ENCOUNTER — Telehealth: Payer: Self-pay | Admitting: Gastroenterology

## 2016-06-09 NOTE — Telephone Encounter (Signed)
Spoke with pt regarding her symptoms post colonoscopy. Her colonoscopy was on 05/20/16 and she has been hurting in her back and she is bloated in her stomach. She feels like its the air they put in you during the procedure that is causing these issue. She stated is isn't as bad a a previous surgery she had when they done the same thing.  She stated she really hasn't had a good bowel movement since procedure. No specimens were taking during colonoscopy and she was to repeat in 10 years. Advised her to get some gas x and probiotics to see if putting some good bacteria back into her bowels will help get her regulated again and get rid of the gas. Pt will contact me if this doesn't help her symptoms.

## 2016-06-09 NOTE — Telephone Encounter (Signed)
Patient left a voice message that she would like to talk to you regarding a procedure she had done 6/29

## 2016-08-15 ENCOUNTER — Other Ambulatory Visit: Payer: Self-pay | Admitting: Family Medicine

## 2016-08-15 DIAGNOSIS — G4700 Insomnia, unspecified: Secondary | ICD-10-CM

## 2016-08-16 NOTE — Telephone Encounter (Signed)
Patient requesting refill of Ambien to [facility]. 

## 2016-08-26 NOTE — Telephone Encounter (Signed)
LMOM for pt to call the office °

## 2016-09-10 ENCOUNTER — Ambulatory Visit: Payer: Managed Care, Other (non HMO)

## 2016-09-10 ENCOUNTER — Ambulatory Visit
Admission: RE | Admit: 2016-09-10 | Discharge: 2016-09-10 | Disposition: A | Discharge status: Home / Self Care | Payer: Managed Care, Other (non HMO) | Source: Ambulatory Visit | Attending: Family Medicine | Admitting: Family Medicine

## 2016-09-10 DIAGNOSIS — R928 Other abnormal and inconclusive findings on diagnostic imaging of breast: Secondary | ICD-10-CM | POA: Insufficient documentation

## 2016-09-10 DIAGNOSIS — Z1239 Encounter for other screening for malignant neoplasm of breast: Secondary | ICD-10-CM

## 2016-09-10 DIAGNOSIS — Z1231 Encounter for screening mammogram for malignant neoplasm of breast: Secondary | ICD-10-CM | POA: Diagnosis not present

## 2016-09-13 ENCOUNTER — Other Ambulatory Visit: Payer: Self-pay | Admitting: Family Medicine

## 2016-09-13 DIAGNOSIS — R928 Other abnormal and inconclusive findings on diagnostic imaging of breast: Secondary | ICD-10-CM

## 2016-09-22 ENCOUNTER — Other Ambulatory Visit: Payer: Self-pay | Admitting: Family Medicine

## 2016-09-22 ENCOUNTER — Ambulatory Visit
Admission: RE | Admit: 2016-09-22 | Discharge: 2016-09-22 | Disposition: A | Discharge status: Home / Self Care | Payer: Managed Care, Other (non HMO) | Source: Ambulatory Visit | Attending: Family Medicine | Admitting: Family Medicine

## 2016-09-22 ENCOUNTER — Encounter: Payer: Self-pay | Admitting: Family Medicine

## 2016-09-22 DIAGNOSIS — R928 Other abnormal and inconclusive findings on diagnostic imaging of breast: Secondary | ICD-10-CM

## 2016-09-22 DIAGNOSIS — C50919 Malignant neoplasm of unspecified site of unspecified female breast: Secondary | ICD-10-CM

## 2016-09-22 DIAGNOSIS — N6310 Unspecified lump in the right breast, unspecified quadrant: Secondary | ICD-10-CM | POA: Insufficient documentation

## 2016-09-22 HISTORY — DX: Malignant neoplasm of unspecified site of unspecified female breast: C50.919

## 2016-09-23 ENCOUNTER — Encounter: Payer: Self-pay | Admitting: *Deleted

## 2016-09-27 ENCOUNTER — Encounter: Payer: Self-pay | Admitting: General Surgery

## 2016-09-27 ENCOUNTER — Inpatient Hospital Stay: Payer: Self-pay

## 2016-09-27 ENCOUNTER — Ambulatory Visit (INDEPENDENT_AMBULATORY_CARE_PROVIDER_SITE_OTHER): Payer: Managed Care, Other (non HMO) | Admitting: General Surgery

## 2016-09-27 VITALS — BP 118/76 | HR 76 | Resp 12 | Ht 62.0 in | Wt 184.0 lb

## 2016-09-27 DIAGNOSIS — N6310 Unspecified lump in the right breast, unspecified quadrant: Secondary | ICD-10-CM | POA: Diagnosis not present

## 2016-09-27 HISTORY — PX: BREAST BIOPSY: SHX20

## 2016-09-27 NOTE — Patient Instructions (Signed)

## 2016-09-27 NOTE — Progress Notes (Addendum)
Patient ID: Shelby Barry, female   DOB: 09/16/1966, 50 y.o.   MRN: [id number]  Chief Complaint  Patient presents with  . Follow-up    HPI Shelby Barry is a 50 y.o. female who presents for a breast evaluation. The most recent mammogram was done on 09/10/16 and added views on 09/22/16. Denies any breast symptoms Patient does perform regular self breast checks and gets regular mammograms done.   I have reviewed the history of present illness with the patient.  HPI  Past Medical History:  Diagnosis Date  . Anemia   . Anxiety   . Bursitis of right shoulder   . Cancer ([place])   . Cataract, bilateral   . Cold    states she has had it for 2 months  . Depression   . GERD (gastroesophageal reflux disease)    uses baking soda for symptoms  . Insomnia   . Lumbago   . Vitamin D deficiency     Past Surgical History:  Procedure Laterality Date  . ABDOMINAL HYSTERECTOMY  04/20/2014  . BILATERAL SALPINGECTOMY    . COLONOSCOPY WITH PROPOFOL N/A 05/20/2016   Procedure: COLONOSCOPY WITH PROPOFOL;  Surgeon: Lucilla Lame, MD;  Location: [place] [facility];  Service: Endoscopy;  Laterality: N/A;  . ECTOPIC PREGNANCY SURGERY    . ENDOMETRIAL ABLATION    . TUBAL LIGATION      Family History  Problem Relation Age of Onset  . Depression Father   . Obesity Father   . Alcohol abuse Brother   . Seizures Brother   . Breast cancer Sister 87    Social History Social History  Substance Use Topics  . Smoking status: Former Smoker    Packs/day: 0.50    Years: 20.00    Types: Cigarettes    Quit date: 08/23/2016  . Smokeless tobacco: Never Used  . Alcohol use 0.0 oz/week     Comment: occassional    Allergies  Allergen Reactions  . Adhesive [Tape] Other (See Comments)    Blisters/rash  PLEASE USE PAPER TAPE ONLY  . Nickel     PT CAN WEAR GOLD, STERLING SILVER WITH NO PROBLEM-PT HAS NEVER HAD ANY PROBLEMS IN THE OR    No current facility-administered medications for this visit.     No current outpatient prescriptions on file.   Facility-Administered Medications Ordered in Other Visits  Medication Dose Route Frequency Provider Last Rate Last Dose  . ceFAZolin (ANCEF) 2-4 GM/100ML-% IVPB           . ceFAZolin (ANCEF) IVPB 2g/100 mL premix  2 g Intravenous On Call to OR Shanelle Clontz Robinette Haines, MD      . Chlorhexidine Gluconate Cloth 2 % PADS 6 each  6 each Topical Once Shreyansh Tiffany Robinette Haines, MD      . famotidine (PEPCID) 20 MG tablet           . lactated ringers infusion   Intravenous Continuous Amy Penwarden, MD 50 mL/hr at 10/12/16 1006      Review of Systems Review of Systems  Constitutional: Negative.   Respiratory: Negative.   Cardiovascular: Negative.     Blood pressure 118/76, pulse 76, resp. rate 12, height 5\' 2"  (1.575 m), weight 184 lb (83.5 kg).  Physical Exam Physical Exam  Constitutional: She is oriented to person, place, and time. She appears well-developed and well-nourished.  Eyes: Conjunctivae are normal. No scleral icterus.  Neck: Neck supple.  Cardiovascular: Normal rate, regular rhythm and normal heart sounds.  Pulmonary/Chest: Effort normal and breath sounds normal. Right breast exhibits no inverted nipple, no nipple discharge, no skin change and no tenderness. Left breast exhibits no inverted nipple, no mass, no nipple discharge, no skin change and no tenderness.  Abdominal: Soft. Bowel sounds are normal. There is no tenderness.  Lymphadenopathy:    She has no cervical adenopathy.    She has no axillary adenopathy.  Neurological: She is alert and oriented to person, place, and time.    Data Reviewed Mammogram and ultrasound reviewed  Suspicious appearing mass in right breast LOQ. 8ocl location Assessment    Right breast mass. Her sister had breast cancer- pt not sure of any details.    Plan    Discussed core biopsy and pt was agreeable-completed today   Pt will be notified on pathology when available. Further plan based on  path report   This information has been scribed by Gaspar Cola CMA.  Altie Savard G 10/12/2016, 10:48 AM

## 2016-09-28 ENCOUNTER — Telehealth: Payer: Self-pay | Admitting: General Surgery

## 2016-09-28 NOTE — Telephone Encounter (Signed)
Got call from pathologist, right breast mass core biopsy showed invasive mammary carcinoma. Pt advised. Have asked pt to come tomorrow at 4.15 for discussion. Also have asked pt to get some details on her sister's breast cancer.

## 2016-09-29 ENCOUNTER — Other Ambulatory Visit: Payer: Self-pay | Admitting: *Deleted

## 2016-09-29 ENCOUNTER — Encounter: Payer: Self-pay | Admitting: General Surgery

## 2016-09-29 ENCOUNTER — Ambulatory Visit (INDEPENDENT_AMBULATORY_CARE_PROVIDER_SITE_OTHER): Payer: Managed Care, Other (non HMO) | Admitting: General Surgery

## 2016-09-29 VITALS — HR 72 | Resp 12 | Ht 62.0 in | Wt 186.0 lb

## 2016-09-29 DIAGNOSIS — C50511 Malignant neoplasm of lower-outer quadrant of right female breast: Secondary | ICD-10-CM

## 2016-09-29 DIAGNOSIS — N6310 Unspecified lump in the right breast, unspecified quadrant: Secondary | ICD-10-CM

## 2016-09-29 NOTE — Progress Notes (Signed)
Patient ID: Shelby Barry, female   DOB: 03/15/1966, 50 y.o.   MRN: [id number]  Chief Complaint  Patient presents with  . Follow-up    HPI Shelby Barry is a 50 y.o. female here today to discuss pathology and management.  I have reviewed the history of present illness with the patient.  HPI  Past Medical History:  Diagnosis Date  . Anxiety   . Bursitis of right shoulder   . Cataract, bilateral   . Cold    states she has had it for 2 months  . Depression   . GERD (gastroesophageal reflux disease)    uses baking soda for symptoms  . Insomnia   . Lumbago   . Vitamin D deficiency     Past Surgical History:  Procedure Laterality Date  . ABDOMINAL HYSTERECTOMY  04/20/2014  . BILATERAL SALPINGECTOMY    . COLONOSCOPY WITH PROPOFOL N/A 05/20/2016   Procedure: COLONOSCOPY WITH PROPOFOL;  Surgeon: Darren Wohl, MD;  Location: [facility];  Service: Endoscopy;  Laterality: N/A;  . ENDOMETRIAL ABLATION      Family History  Problem Relation Age of Onset  . Depression Father   . Obesity Father   . Alcohol abuse Brother   . Seizures Brother   . Breast cancer Sister 40    Social History Social History  Substance Use Topics  . Smoking status: Current Every Day Smoker    Packs/day: 0.50    Years: 20.00    Types: Cigarettes    Last attempt to quit: 11/21/2015  . Smokeless tobacco: Never Used  . Alcohol use 0.0 oz/week     Comment: occassional    No Known Allergies  Current Outpatient Prescriptions  Medication Sig Dispense Refill  . cholecalciferol (VITAMIN D) 1000 UNITS tablet Take by mouth. Reported on 04/28/2016    . meloxicam (MOBIC) 15 MG tablet Take 1 tablet (15 mg total) by mouth daily. 30 tablet 2  . MULTIPLE VITAMINS PO Take by mouth.    . Omega-3 Fatty Acids (FISH OIL) 1000 MG CAPS Take 1 capsule by mouth once.    . predniSONE (DELTASONE) 20 MG tablet Take 1 tablet (20 mg total) by mouth daily with breakfast. 10 tablet 0  . zolpidem (AMBIEN) 10 MG tablet  Take 1 tablet (10 mg total) by mouth at bedtime. 30 tablet 2   No current facility-administered medications for this visit.     Review of Systems Review of Systems  Constitutional: Negative.   Respiratory: Negative.   Cardiovascular: Negative.     Pulse 72, resp. rate 12, height 5' 2" (1.575 m), weight 186 lb (84.4 kg).  Physical Exam Physical Exam  Data Reviewed Pathology- Invasive mammary carcinoma. ER/PR and Her2 are pending.  Assessment    Invasive cancer, right breast. Clinical T1N0, ER/PR and Her2 are pending    Plan   Patient was advised fully on pathology report. Treatment options were also explained in full. Recommended lumpectomy and sentinel node biopsy. Procedure risks and benefits explained. Patient is agreeable. Patient will be advised on the prognostic factors when available.  Patient's surgery has been scheduled for 10-12-16 at [facility].      This information has been scribed by Jessica Qualls CMA.   Shelby Barry 09/30/2016, 10:32 AM   

## 2016-09-30 ENCOUNTER — Other Ambulatory Visit: Payer: Self-pay | Admitting: *Deleted

## 2016-09-30 ENCOUNTER — Encounter: Payer: Self-pay | Admitting: General Surgery

## 2016-09-30 DIAGNOSIS — C50511 Malignant neoplasm of lower-outer quadrant of right female breast: Secondary | ICD-10-CM

## 2016-09-30 NOTE — Progress Notes (Signed)
Patient to stop by [facility] to have the following drawn today: CBC, Met C, CEA, and CA 27.29.

## 2016-10-01 LAB — COMPREHENSIVE METABOLIC PANEL
A/G RATIO: 1.8 (ref 1.2–2.2)
ALBUMIN: 4.3 g/dL (ref 3.5–5.5)
ALT: 47 IU/L — ABNORMAL HIGH (ref 0–32)
AST: 27 IU/L (ref 0–40)
Alkaline Phosphatase: 105 IU/L (ref 39–117)
BUN / CREAT RATIO: 16 (ref 9–23)
BUN: 11 mg/dL (ref 6–24)
CALCIUM: 9.6 mg/dL (ref 8.7–10.2)
CO2: 28 mmol/L (ref 18–29)
Chloride: 101 mmol/L (ref 96–106)
Creatinine, Ser: 0.69 mg/dL (ref 0.57–1.00)
GFR, EST AFRICAN AMERICAN: 117 mL/min/{1.73_m2} (ref >59)
GFR, EST NON AFRICAN AMERICAN: 102 mL/min/{1.73_m2} (ref >59)
GLOBULIN, TOTAL: 2.4 g/dL (ref 1.5–4.5)
Glucose: 107 mg/dL — ABNORMAL HIGH (ref 65–99)
POTASSIUM: 4.1 mmol/L (ref 3.5–5.2)
SODIUM: 146 mmol/L — AB (ref 134–144)
TOTAL PROTEIN: 6.7 g/dL (ref 6.0–8.5)

## 2016-10-01 LAB — CBC WITH DIFFERENTIAL/PLATELET
BASOS: 1 %
Basophils Absolute: 0 10*3/uL (ref 0.0–0.2)
EOS (ABSOLUTE): 0.3 10*3/uL (ref 0.0–0.4)
EOS: 4 %
HEMATOCRIT: 42.8 % (ref 34.0–46.6)
Hemoglobin: 14.7 g/dL (ref 11.1–15.9)
IMMATURE GRANS (ABS): 0 10*3/uL (ref 0.0–0.1)
IMMATURE GRANULOCYTES: 0 %
LYMPHS: 28 %
Lymphocytes Absolute: 2.3 10*3/uL (ref 0.7–3.1)
MCH: 28.8 pg (ref 26.6–33.0)
MCHC: 34.3 g/dL (ref 31.5–35.7)
MCV: 84 fL (ref 79–97)
MONOS ABS: 0.5 10*3/uL (ref 0.1–0.9)
Monocytes: 6 %
NEUTROS ABS: 5.2 10*3/uL (ref 1.4–7.0)
NEUTROS PCT: 61 %
Platelets: 310 10*3/uL (ref 150–379)
RBC: 5.11 x10E6/uL (ref 3.77–5.28)
RDW: 13.7 % (ref 12.3–15.4)
WBC: 8.4 10*3/uL (ref 3.4–10.8)

## 2016-10-01 LAB — CEA: CEA: 2 ng/mL (ref 0.0–4.7)

## 2016-10-01 LAB — CANCER ANTIGEN 27.29: CA 27.29: 15.5 U/mL (ref 0.0–38.6)

## 2016-10-05 ENCOUNTER — Telehealth: Payer: Self-pay | Admitting: General Surgery

## 2016-10-05 NOTE — Telephone Encounter (Signed)
Patient was notified by phone today that her ER and PR were both positive and HER-2/neu was negative. She is scheduled for her lumpectomy and sentinel node biopsy next week. Expect  very good outcome given the above favorable prognostic factors.

## 2016-10-06 ENCOUNTER — Encounter: Payer: Self-pay | Admitting: General Surgery

## 2016-10-06 ENCOUNTER — Encounter
Admission: RE | Admit: 2016-10-06 | Discharge: 2016-10-06 | Disposition: A | Discharge status: Home / Self Care | Payer: Managed Care, Other (non HMO) | Source: Ambulatory Visit | Attending: General Surgery | Admitting: General Surgery

## 2016-10-06 HISTORY — DX: Malignant (primary) neoplasm, unspecified: C80.1

## 2016-10-06 HISTORY — DX: Anemia, unspecified: D64.9

## 2016-10-06 NOTE — Patient Instructions (Signed)
  Your procedure is scheduled on: 10-12-16 Report to [facility] @ 8:30 PER PT   Remember: Instructions that are not followed completely may result in serious medical risk, up to and including death, or upon the discretion of your surgeon and anesthesiologist your surgery may need to be rescheduled.    _x___ 1. Do not eat food or drink liquids after midnight. No gum chewing or hard candies.     __x__ 2. No Alcohol for 24 hours before or after surgery.   __x__3. No Smoking for 24 prior to surgery.   ____  4. Bring all medications with you on the day of surgery if instructed.    __x__ 5. Notify your doctor if there is any change in your medical condition     (cold, fever, infections).     Do not wear jewelry, make-up, hairpins, clips or nail polish.  Do not wear lotions, powders, or perfumes. You may wear deodorant.  Do not shave 48 hours prior to surgery. Men may shave face and neck.  Do not bring valuables to the hospital.    [facility] is not responsible for any belongings or valuables.               Contacts, dentures or bridgework may not be worn into surgery.  Leave your suitcase in the car. After surgery it may be brought to your room.  For patients admitted to the hospital, discharge time is determined by your treatment team.   Patients discharged the day of surgery will not be allowed to drive home.    Please read over the following fact sheets that you were given:   [facility] Preparing for Surgery and or MRSA Information   ____ Take these medicines the morning of surgery with A SIP OF WATER:    1. NONE  2.  3.  4.  5.  6.  ____Fleets enema or Magnesium Citrate as directed.   ____ Use CHG Soap or sage wipes as directed on instruction sheet   ____ Use inhalers on the day of surgery and bring to hospital day of surgery  ____ Stop metformin 2 days prior to surgery    ____ Take 1/2 of usual insulin dose the night before surgery and none on the  morning of  surgery.   ____ Stop aspirin or coumadin, or plavix  x__ Stop Anti-inflammatories such as Advil, Aleve, Ibuprofen, Motrin, Naproxen,          Naprosyn, Goodies powders or aspirin products-STOP MELOXICAM NOW- Ok to take Tylenol.   __X__ Stop supplements until after surgery-STOP HAIR SKIN AND NAILS NOW  ____ Bring C-Pap to the hospital.

## 2016-10-12 ENCOUNTER — Ambulatory Visit
Admission: RE | Admit: 2016-10-12 | Discharge: 2016-10-12 | Disposition: A | Discharge status: Home / Self Care | Payer: Managed Care, Other (non HMO) | Source: Ambulatory Visit | Attending: General Surgery | Admitting: General Surgery

## 2016-10-12 ENCOUNTER — Ambulatory Visit: Payer: Managed Care, Other (non HMO) | Admitting: Registered Nurse

## 2016-10-12 ENCOUNTER — Encounter
Admission: RE | Admit: 2016-10-12 | Discharge: 2016-10-12 | Disposition: A | Discharge status: Home / Self Care | Payer: Managed Care, Other (non HMO) | Source: Ambulatory Visit | Attending: General Surgery | Admitting: General Surgery

## 2016-10-12 ENCOUNTER — Encounter
Admission: RE | Disposition: A | Discharge status: Home / Self Care | Payer: Self-pay | Source: Ambulatory Visit | Attending: General Surgery

## 2016-10-12 ENCOUNTER — Encounter: Payer: Self-pay | Admitting: *Deleted

## 2016-10-12 DIAGNOSIS — G4700 Insomnia, unspecified: Secondary | ICD-10-CM | POA: Insufficient documentation

## 2016-10-12 DIAGNOSIS — K219 Gastro-esophageal reflux disease without esophagitis: Secondary | ICD-10-CM | POA: Insufficient documentation

## 2016-10-12 DIAGNOSIS — F419 Anxiety disorder, unspecified: Secondary | ICD-10-CM | POA: Diagnosis not present

## 2016-10-12 DIAGNOSIS — F17210 Nicotine dependence, cigarettes, uncomplicated: Secondary | ICD-10-CM | POA: Diagnosis not present

## 2016-10-12 DIAGNOSIS — E559 Vitamin D deficiency, unspecified: Secondary | ICD-10-CM | POA: Insufficient documentation

## 2016-10-12 DIAGNOSIS — F329 Major depressive disorder, single episode, unspecified: Secondary | ICD-10-CM | POA: Diagnosis not present

## 2016-10-12 DIAGNOSIS — C50511 Malignant neoplasm of lower-outer quadrant of right female breast: Secondary | ICD-10-CM | POA: Insufficient documentation

## 2016-10-12 DIAGNOSIS — Z803 Family history of malignant neoplasm of breast: Secondary | ICD-10-CM | POA: Diagnosis not present

## 2016-10-12 DIAGNOSIS — D649 Anemia, unspecified: Secondary | ICD-10-CM | POA: Diagnosis not present

## 2016-10-12 DIAGNOSIS — C773 Secondary and unspecified malignant neoplasm of axilla and upper limb lymph nodes: Secondary | ICD-10-CM | POA: Insufficient documentation

## 2016-10-12 HISTORY — PX: BREAST LUMPECTOMY WITH SENTINEL LYMPH NODE BIOPSY: SHX5597

## 2016-10-12 HISTORY — PX: BREAST LUMPECTOMY: SHX2

## 2016-10-12 SURGERY — BREAST LUMPECTOMY WITH SENTINEL LYMPH NODE BX
Anesthesia: General | Laterality: Right | Wound class: Clean

## 2016-10-12 MED ORDER — TECHNETIUM TC 99M SULFUR COLLOID
1.0900 | Freq: Once | INTRAVENOUS | Status: AC | PRN
  Administered 2016-10-12: 1.09 via INTRAVENOUS

## 2016-10-12 MED ORDER — SUCCINYLCHOLINE CHLORIDE 20 MG/ML IJ SOLN
INTRAMUSCULAR | Status: DC | PRN
  Administered 2016-10-12: 80 mg via INTRAVENOUS

## 2016-10-12 MED ORDER — FAMOTIDINE 20 MG PO TABS
20.0000 mg | ORAL_TABLET | Freq: Once | ORAL | Status: AC
  Administered 2016-10-12: 20 mg via ORAL

## 2016-10-12 MED ORDER — LACTATED RINGERS IV SOLN
INTRAVENOUS | Status: DC
  Administered 2016-10-12 (×2): via INTRAVENOUS

## 2016-10-12 MED ORDER — TRAMADOL HCL 50 MG PO TABS: 50.0000 mg | ORAL_TABLET | Freq: Four times a day (QID) | ORAL | Status: DC

## 2016-10-12 MED ORDER — ACETAMINOPHEN 10 MG/ML IV SOLN
INTRAVENOUS | Status: DC | PRN
  Administered 2016-10-12: 1000 mg via INTRAVENOUS

## 2016-10-12 MED ORDER — CEFAZOLIN SODIUM-DEXTROSE 2-4 GM/100ML-% IV SOLN
INTRAVENOUS | Status: AC
  Filled 2016-10-12: qty 100

## 2016-10-12 MED ORDER — ONDANSETRON HCL 4 MG/2ML IJ SOLN: 4.0000 mg | Freq: Once | INTRAMUSCULAR | Status: DC | PRN

## 2016-10-12 MED ORDER — CEFAZOLIN SODIUM-DEXTROSE 2-4 GM/100ML-% IV SOLN
2.0000 g | INTRAVENOUS | Status: AC
  Administered 2016-10-12: 2 g via INTRAVENOUS

## 2016-10-12 MED ORDER — MIDAZOLAM HCL 2 MG/2ML IJ SOLN
INTRAMUSCULAR | Status: DC | PRN
  Administered 2016-10-12: 2 mg via INTRAVENOUS

## 2016-10-12 MED ORDER — FENTANYL CITRATE (PF) 100 MCG/2ML IJ SOLN
25.0000 ug | INTRAMUSCULAR | Status: DC | PRN
Start: 2016-10-12 — End: 2016-10-12
  Administered 2016-10-12 (×2): 25 ug via INTRAVENOUS

## 2016-10-12 MED ORDER — FAMOTIDINE 20 MG PO TABS
ORAL_TABLET | ORAL | Status: AC
  Filled 2016-10-12: qty 1

## 2016-10-12 MED ORDER — FENTANYL CITRATE (PF) 100 MCG/2ML IJ SOLN
INTRAMUSCULAR | Status: DC | PRN
  Administered 2016-10-12 (×4): 50 ug via INTRAVENOUS

## 2016-10-12 MED ORDER — GLYCOPYRROLATE 0.2 MG/ML IJ SOLN
INTRAMUSCULAR | Status: DC | PRN
  Administered 2016-10-12: 0.2 mg via INTRAVENOUS

## 2016-10-12 MED ORDER — CHLORHEXIDINE GLUCONATE CLOTH 2 % EX PADS: 6.0000 | MEDICATED_PAD | Freq: Once | CUTANEOUS | Status: DC

## 2016-10-12 MED ORDER — BUPIVACAINE HCL (PF) 0.5 % IJ SOLN
INTRAMUSCULAR | Status: DC | PRN
  Administered 2016-10-12: 20 mL

## 2016-10-12 MED ORDER — TRAMADOL HCL 50 MG PO TABS
ORAL_TABLET | ORAL | Status: AC
  Administered 2016-10-12: 50 mg
  Filled 2016-10-12: qty 1

## 2016-10-12 MED ORDER — PROPOFOL 10 MG/ML IV BOLUS
INTRAVENOUS | Status: DC | PRN
  Administered 2016-10-12: 30 mg via INTRAVENOUS
  Administered 2016-10-12: 150 mg via INTRAVENOUS
  Administered 2016-10-12: 30 mg via INTRAVENOUS

## 2016-10-12 MED ORDER — TRAMADOL HCL 50 MG PO TABS: 50.0000 mg | ORAL_TABLET | Freq: Four times a day (QID) | ORAL | 0 refills | Status: DC | PRN

## 2016-10-12 MED ORDER — DEXAMETHASONE SODIUM PHOSPHATE 10 MG/ML IJ SOLN
INTRAMUSCULAR | Status: DC | PRN
  Administered 2016-10-12: 5 mg via INTRAVENOUS

## 2016-10-12 MED ORDER — PHENYLEPHRINE HCL 10 MG/ML IJ SOLN
INTRAMUSCULAR | Status: DC | PRN
  Administered 2016-10-12 (×7): 100 ug via INTRAVENOUS

## 2016-10-12 MED ORDER — LIDOCAINE HCL (CARDIAC) 20 MG/ML IV SOLN
INTRAVENOUS | Status: DC | PRN
  Administered 2016-10-12: 100 mg via INTRAVENOUS

## 2016-10-12 MED ORDER — ACETAMINOPHEN 10 MG/ML IV SOLN
INTRAVENOUS | Status: AC
  Filled 2016-10-12: qty 100

## 2016-10-12 MED ORDER — BUPIVACAINE HCL (PF) 0.5 % IJ SOLN
INTRAMUSCULAR | Status: AC
  Filled 2016-10-12: qty 30

## 2016-10-12 MED ORDER — SODIUM CHLORIDE 0.9 % IJ SOLN
INTRAMUSCULAR | Status: AC
  Filled 2016-10-12: qty 10

## 2016-10-12 MED ORDER — FENTANYL CITRATE (PF) 100 MCG/2ML IJ SOLN
INTRAMUSCULAR | Status: AC
  Filled 2016-10-12: qty 2

## 2016-10-12 MED ORDER — ONDANSETRON HCL 4 MG/2ML IJ SOLN
INTRAMUSCULAR | Status: DC | PRN
  Administered 2016-10-12: 4 mg via INTRAVENOUS

## 2016-10-12 MED ORDER — METHYLENE BLUE 0.5 % INJ SOLN
INTRAVENOUS | Status: AC
  Filled 2016-10-12: qty 10

## 2016-10-12 SURGICAL SUPPLY — 40 items
BLADE SURG 15 STRL SS SAFETY (BLADE) ×6 IMPLANT
BULB RESERV EVAC DRAIN JP 100C (MISCELLANEOUS) IMPLANT
CANISTER SUCT 1200ML W/VALVE (MISCELLANEOUS) ×3 IMPLANT
CHLORAPREP W/TINT 26ML (MISCELLANEOUS) ×3 IMPLANT
CLOSURE WOUND 1/2 X4 (GAUZE/BANDAGES/DRESSINGS)
CNTNR SPEC 2.5X3XGRAD LEK (MISCELLANEOUS) ×1
CONT SPEC 4OZ STER OR WHT (MISCELLANEOUS) ×2
CONTAINER SPEC 2.5X3XGRAD LEK (MISCELLANEOUS) ×1 IMPLANT
COVER PROBE FLX POLY STRL (MISCELLANEOUS) ×3 IMPLANT
DECANTER SPIKE VIAL GLASS SM (MISCELLANEOUS) ×3 IMPLANT
DERMABOND ADVANCED (GAUZE/BANDAGES/DRESSINGS) ×4
DERMABOND ADVANCED .7 DNX12 (GAUZE/BANDAGES/DRESSINGS) ×2 IMPLANT
DEVICE CAVITY EVALUATION 9031 (MISCELLANEOUS) ×3 IMPLANT
DEVICE DUBIN SPECIMEN MAMMOGRA (MISCELLANEOUS) ×3 IMPLANT
DEVICE LOCALIZATION ULTRAWIRE (WIRE) ×1 IMPLANT
DRAIN CHANNEL JP 15F RND 16 (MISCELLANEOUS) IMPLANT
DRAPE LAPAROTOMY TRNSV 106X77 (MISCELLANEOUS) ×3 IMPLANT
ELECT CAUTERY BLADE 6.4 (BLADE) ×3 IMPLANT
ELECT REM PT RETURN 9FT ADLT (ELECTROSURGICAL) ×3
ELECTRODE REM PT RTRN 9FT ADLT (ELECTROSURGICAL) ×1 IMPLANT
GLOVE BIO SURGEON STRL SZ7 (GLOVE) ×12 IMPLANT
GOWN STRL REUS W/ TWL LRG LVL3 (GOWN DISPOSABLE) ×3 IMPLANT
GOWN STRL REUS W/TWL LRG LVL3 (GOWN DISPOSABLE) ×6
HARMONIC SCALPEL FOCUS (MISCELLANEOUS) IMPLANT
KIT RM TURNOVER STRD PROC AR (KITS) ×3 IMPLANT
LABEL OR SOLS (LABEL) ×3 IMPLANT
LIQUID BAND (GAUZE/BANDAGES/DRESSINGS) IMPLANT
MARGIN MAP 10MM (MISCELLANEOUS) ×3 IMPLANT
NDL SAFETY 22GX1.5 (NEEDLE) IMPLANT
NEEDLE HYPO 25X1 1.5 SAFETY (NEEDLE) ×3 IMPLANT
PACK BASIN MINOR ARMC (MISCELLANEOUS) ×3 IMPLANT
SLEVE PROBE SENORX GAMMA FIND (MISCELLANEOUS) ×3 IMPLANT
STRIP CLOSURE SKIN 1/2X4 (GAUZE/BANDAGES/DRESSINGS) IMPLANT
SUT ETH BLK MONO 3 0 FS 1 12/B (SUTURE) ×3 IMPLANT
SUT MNCRL AB 3-0 PS2 27 (SUTURE) ×3 IMPLANT
SUT VIC AB 2-0 BRD 54 (SUTURE) ×3 IMPLANT
SUT VIC AB 2-0 CT2 27 (SUTURE) ×9 IMPLANT
SYRINGE 10CC LL (SYRINGE) IMPLANT
ULTRAWIRE LOCALIZATION DEVICE (WIRE) ×3
WATER STERILE IRR 1000ML POUR (IV SOLUTION) ×3 IMPLANT

## 2016-10-12 NOTE — Op Note (Signed)
Preop diagnosis: Carcinoma right breast lower outer quadrant  Post op diagnosis: Same  Operation: Right breast mass wide excision with sentinel node biopsy. Ultrasound-guided wire localization of mass in the breast. Cavity assessment for possible MammoSite placement later  Surgeon: Mckinley Jewel  Assistant:     Anesthesia: Gen.  Complications: None  EBL: 25 mL  Drains: None  Description: Patient underwent nuclear contrast injection the right breast preoperatively, she was then brought to the operating room and put to sleep with an LMA. The right breast and axilla were then prepped and draped as sterile field. The patient had excellent signal activity in the axilla in the more anterior part and accordingly the blue dye was not utilized. Timeout was performed. Incision was made along the skin crease overlying the area of intense signal activity in the axilla. Incision was deepened through the layers and into the axillary fat pad with bleeding controlled with cautery. 3 nodes were identified although with the significant signal activity over the largest of them about 2 cm long by a centimeter and a half had the highest signal of well over thousands. These were labeled as 1213 and sent to pathology in formalin. After ensuring hemostasis the deeper layers were closed in with interrupted 2-0 Vicryl stitches and the skin then approximated with 3-0 Monocryl subcuticular stitch.  Right breast wide excision was then performed. The mass in question was located at the 8:00 location way out to the periphery of the breast. Ultrasound probe was brought to the field in the mass in question was identified. With a small stab incision in a Bard Ultra wire was positioned going through the mass with ultrasound guidance. After this was anchored in place incision was made in a curvilinear fashion along the 8:00 region of the right breast extending from the wire entrance side upward. System was deepened through the  subcutaneous tissue and the wire was freed of he was then followed and using it as a guide and core tissue was excised out to include a palpable small lump in the middle. Given the far lateral location and there was a Truman Hayward a small amount of subcutaneous tissue at this site and therefore the anterior margin appeared to be the shortest on gross evaluation. Bleeding was controlled cautery and ligatures of 303 0 Vicryl. Following this the wound was irrigated and the deeper layers closed with interrupted 2-0 Vicryl subcutaneous tissue also with a similar 2-0 Vicryl. The excised tissue was evaluated with ultrasound showing the presence of the nodule within and it was tagged for margins and sent to pathology. The skin was closed with the subcuticular 3-0 Monocryl. In order to assess possibility views MammoSite the cavity evaluation device was brought up. Through a stab incision lateral to the wide excision site at tract was created towards the lumpectomy cavity and the cavity evaluation device was placed. This was inflated to 40 mL of fluid and ultrasound revealed the skin margins approximately in the range of 1.6-1.7 cm all around. This was acceptable for subsequent MammoSite placement. The balloon was then deflated and the cavity  evaluation catheter was removed. A small stab incision was closed with that 3-0 Monocryl. Dermabond was applied also a total of 20 mL of 0.5% Marcaine was instilled around both incision for postop analgesia.  patient subsequently was extubated and returned recovery room stable condition

## 2016-10-12 NOTE — Anesthesia Procedure Notes (Signed)
Procedure Name: LMA Insertion Date/Time: 10/12/2016 11:22 AM Performed by: Doreen Salvage Pre-anesthesia Checklist: Patient identified, Patient being monitored, Timeout performed, Emergency Drugs available and Suction available Patient Re-evaluated:Patient Re-evaluated prior to inductionOxygen Delivery Method: Circle system utilized Preoxygenation: Pre-oxygenation with 100% oxygen Intubation Type: IV induction Ventilation: Mask ventilation without difficulty LMA: LMA inserted LMA Size: 3.5 Tube type: Oral Number of attempts: 1 Placement Confirmation: positive ETCO2 and breath sounds checked- equal and bilateral Tube secured with: Tape Dental Injury: Teeth and Oropharynx as per pre-operative assessment

## 2016-10-12 NOTE — Discharge Instructions (Signed)

## 2016-10-12 NOTE — Transfer of Care (Signed)
Immediate Anesthesia Transfer of Care Note  [name]  Procedure(s) Performed: Procedure(s): BREAST LUMPECTOMY WITH SENTINEL LYMPH NODE BX (Right)  Patient Location: PACU  Anesthesia Type:General  Level of Consciousness: sedated  Airway & Oxygen Therapy: Patient Spontanous Breathing and Patient connected to face mask oxygen  Post-op Assessment: Report given to RN and Post -op Vital signs reviewed and stable  Post vital signs: Reviewed and stable  Last Vitals:  Vitals:   10/12/16 0940 10/12/16 1334  BP: (!) 146/77 (!) 142/72  Pulse: 98 (!) 104  Resp: 16 15  Temp: 36.6 C [id number] C    Complications: No apparent anesthesia complications

## 2016-10-12 NOTE — Interval H&P Note (Signed)
History and Physical Interval Note:  10/12/2016 10:49 AM  Shelby Barry  has presented today for surgery, with the diagnosis of CANCER RIGHT BREAST  The various methods of treatment have been discussed with the patient and family. After consideration of risks, benefits and other options for treatment, the patient has consented to  Procedure(s): BREAST LUMPECTOMY WITH SENTINEL LYMPH NODE BX (Right) as a surgical intervention .  The patient's history has been reviewed, patient examined, no change in status, stable for surgery.  I have reviewed the patient's chart and labs.  Questions were answered to the patient's satisfaction.     Zerek Litsey G

## 2016-10-12 NOTE — Anesthesia Procedure Notes (Signed)
Procedure Name: Intubation Date/Time: 10/12/2016 11:27 AM Performed by: Doreen Salvage Pre-anesthesia Checklist: Patient identified, Patient being monitored, Timeout performed, Emergency Drugs available and Suction available Patient Re-evaluated:Patient Re-evaluated prior to inductionOxygen Delivery Method: Circle system utilized Preoxygenation: Pre-oxygenation with 100% oxygen Intubation Type: IV induction Ventilation: Mask ventilation without difficulty Laryngoscope Size: Mac and 3 Grade View: Grade I Tube type: Oral Tube size: 7.0 mm Number of attempts: 1 Airway Equipment and Method: Stylet Placement Confirmation: ETT inserted through vocal cords under direct vision,  positive ETCO2 and breath sounds checked- equal and bilateral Secured at: 21 cm Tube secured with: Tape Dental Injury: Teeth and Oropharynx as per pre-operative assessment  Comments: Patient intubated after LMA removed due to laryngospasm

## 2016-10-12 NOTE — Anesthesia Preprocedure Evaluation (Addendum)
Anesthesia Evaluation  Patient identified by MRN, date of birth, ID band Patient awake    Reviewed: Allergy & Precautions, NPO status , Patient's Chart, lab work & pertinent test results, reviewed documented beta blocker date and time   Airway Mallampati: II  TM Distance: >3 FB     Dental  (+) Chipped, Upper Dentures   Pulmonary former smoker,           Cardiovascular      Neuro/Psych PSYCHIATRIC DISORDERS Anxiety Depression    GI/Hepatic   Endo/Other    Renal/GU      Musculoskeletal   Abdominal   Peds  Hematology  (+) anemia ,   Anesthesia Other Findings   Reproductive/Obstetrics                            Anesthesia Physical Anesthesia Plan  ASA: III  Anesthesia Plan: General   Post-op Pain Management:    Induction: Intravenous  Airway Management Planned: LMA  Additional Equipment:   Intra-op Plan:   Post-operative Plan:   Informed Consent: I have reviewed the patients History and Physical, chart, labs and discussed the procedure including the risks, benefits and alternatives for the proposed anesthesia with the patient or authorized representative who has indicated his/her understanding and acceptance.     Plan Discussed with: CRNA  Anesthesia Plan Comments:         Anesthesia Quick Evaluation

## 2016-10-12 NOTE — H&P (View-Only) (Signed)
Patient ID: Shelby Barry, female   DOB: 04-20-1966, 50 y.o.   MRN: [id number]  Chief Complaint  Patient presents with  . Follow-up    HPI Shelby Barry is a 50 y.o. female here today to discuss pathology and management.  I have reviewed the history of present illness with the patient.  HPI  Past Medical History:  Diagnosis Date  . Anxiety   . Bursitis of right shoulder   . Cataract, bilateral   . Cold    states she has had it for 2 months  . Depression   . GERD (gastroesophageal reflux disease)    uses baking soda for symptoms  . Insomnia   . Lumbago   . Vitamin D deficiency     Past Surgical History:  Procedure Laterality Date  . ABDOMINAL HYSTERECTOMY  04/20/2014  . BILATERAL SALPINGECTOMY    . COLONOSCOPY WITH PROPOFOL N/A 05/20/2016   Procedure: COLONOSCOPY WITH PROPOFOL;  Surgeon: Lucilla Lame, MD;  Location: [facility];  Service: Endoscopy;  Laterality: N/A;  . ENDOMETRIAL ABLATION      Family History  Problem Relation Age of Onset  . Depression Father   . Obesity Father   . Alcohol abuse Brother   . Seizures Brother   . Breast cancer Sister 62    Social History Social History  Substance Use Topics  . Smoking status: Current Every Day Smoker    Packs/day: 0.50    Years: 20.00    Types: Cigarettes    Last attempt to quit: 11/21/2015  . Smokeless tobacco: Never Used  . Alcohol use 0.0 oz/week     Comment: occassional    No Known Allergies  Current Outpatient Prescriptions  Medication Sig Dispense Refill  . cholecalciferol (VITAMIN D) 1000 UNITS tablet Take by mouth. Reported on 04/28/2016    . meloxicam (MOBIC) 15 MG tablet Take 1 tablet (15 mg total) by mouth daily. 30 tablet 2  . MULTIPLE VITAMINS PO Take by mouth.    . Omega-3 Fatty Acids (FISH OIL) 1000 MG CAPS Take 1 capsule by mouth once.    . predniSONE (DELTASONE) 20 MG tablet Take 1 tablet (20 mg total) by mouth daily with breakfast. 10 tablet 0  . zolpidem (AMBIEN) 10 MG tablet  Take 1 tablet (10 mg total) by mouth at bedtime. 30 tablet 2   No current facility-administered medications for this visit.     Review of Systems Review of Systems  Constitutional: Negative.   Respiratory: Negative.   Cardiovascular: Negative.     Pulse 72, resp. rate 12, height _0  (1.575 m), weight 186 lb (84.4 kg).  Physical Exam Physical Exam  Data Reviewed Pathology- Invasive mammary carcinoma. ER/PR and Her2 are pending.  Assessment    Invasive cancer, right breast. Clinical T1N0, ER/PR and Her2 are pending    Plan   Patient was advised fully on pathology report. Treatment options were also explained in full. Recommended lumpectomy and sentinel node biopsy. Procedure risks and benefits explained. Patient is agreeable. Patient will be advised on the prognostic factors when available.  Patient's surgery has been scheduled for 10-12-16 at [facility].      This information has been scribed by Gaspar Cola CMA.   Shelby Barry G 09/30/2016, 10:32 AM

## 2016-10-12 NOTE — H&P (View-Only) (Signed)
Patient ID: Shelby Barry, female   DOB: 09/02/1966, 50 y.o.   MRN: [id number]  Chief Complaint  Patient presents with  . Follow-up    HPI Shelby Barry is a 50 y.o. female who presents for a breast evaluation. The most recent mammogram was done on 09/10/16 and added views on 09/22/16. Denies any breast symptoms Patient does perform regular self breast checks and gets regular mammograms done.   I have reviewed the history of present illness with the patient.  HPI  Past Medical History:  Diagnosis Date  . Anemia   . Anxiety   . Bursitis of right shoulder   . Cancer ([place])   . Cataract, bilateral   . Cold    states she has had it for 2 months  . Depression   . GERD (gastroesophageal reflux disease)    uses baking soda for symptoms  . Insomnia   . Lumbago   . Vitamin D deficiency     Past Surgical History:  Procedure Laterality Date  . ABDOMINAL HYSTERECTOMY  04/20/2014  . BILATERAL SALPINGECTOMY    . COLONOSCOPY WITH PROPOFOL N/A 05/20/2016   Procedure: COLONOSCOPY WITH PROPOFOL;  Surgeon: Lucilla Lame, MD;  Location: [place];  Service: Endoscopy;  Laterality: N/A;  . ECTOPIC PREGNANCY SURGERY    . ENDOMETRIAL ABLATION    . TUBAL LIGATION      Family History  Problem Relation Age of Onset  . Depression Father   . Obesity Father   . Alcohol abuse Brother   . Seizures Brother   . Breast cancer Sister 6    Social History Social History  Substance Use Topics  . Smoking status: Former Smoker    Packs/day: 0.50    Years: 20.00    Types: Cigarettes    Quit date: 08/23/2016  . Smokeless tobacco: Never Used  . Alcohol use 0.0 oz/week     Comment: occassional    Allergies  Allergen Reactions  . Adhesive [Tape] Other (See Comments)    Blisters/rash  PLEASE USE PAPER TAPE ONLY  . Nickel     PT CAN WEAR GOLD, STERLING SILVER WITH NO PROBLEM-PT HAS NEVER HAD ANY PROBLEMS IN THE OR    No current facility-administered medications for this visit.     No current outpatient prescriptions on file.   Facility-Administered Medications Ordered in Other Visits  Medication Dose Route Frequency Provider Last Rate Last Dose  . ceFAZolin (ANCEF) 2-4 GM/100ML-% IVPB           . ceFAZolin (ANCEF) IVPB 2g/100 mL premix  2 g Intravenous On Call to OR Ison Wichmann Robinette Haines, MD      . Chlorhexidine Gluconate Cloth 2 % PADS 6 each  6 each Topical Once Agastya Meister Robinette Haines, MD      . famotidine (PEPCID) 20 MG tablet           . lactated ringers infusion   Intravenous Continuous Amy Penwarden, MD 50 mL/hr at 10/12/16 1006      Review of Systems Review of Systems  Constitutional: Negative.   Respiratory: Negative.   Cardiovascular: Negative.     Blood pressure 118/76, pulse 76, resp. rate 12, height 5\' 2"  (1.575 m), weight 184 lb (83.5 kg).  Physical Exam Physical Exam  Constitutional: She is oriented to person, place, and time. She appears well-developed and well-nourished.  Eyes: Conjunctivae are normal. No scleral icterus.  Neck: Neck supple.  Cardiovascular: Normal rate, regular rhythm and normal heart sounds.  Pulmonary/Chest: Effort normal and breath sounds normal. Right breast exhibits no inverted nipple, no nipple discharge, no skin change and no tenderness. Left breast exhibits no inverted nipple, no mass, no nipple discharge, no skin change and no tenderness.  Abdominal: Soft. Bowel sounds are normal. There is no tenderness.  Lymphadenopathy:    She has no cervical adenopathy.    She has no axillary adenopathy.  Neurological: She is alert and oriented to person, place, and time.    Data Reviewed Mammogram and ultrasound reviewed  Suspicious appearing mass in right breast LOQ. 8ocl location Assessment    Right breast mass. Her sister had breast cancer- pt not sure of any details.    Plan    Discussed core biopsy and pt was agreeable-completed today   Pt will be notified on pathology when available. Further plan based on  path report   This information has been scribed by Gaspar Cola CMA.  Aadyn Buchheit G 10/12/2016, 10:48 AM

## 2016-10-12 NOTE — Interval H&P Note (Signed)
History and Physical Interval Note:  10/12/2016 10:46 AM  Shelby Barry  has presented today for surgery, with the diagnosis of CANCER RIGHT BREAST  The various methods of treatment have been discussed with the patient and family. After consideration of risks, benefits and other options for treatment, the patient has consented to  Procedure(s): BREAST LUMPECTOMY WITH SENTINEL LYMPH NODE BX (Right) as a surgical intervention .  The patient's history has been reviewed, patient examined, no change in status, stable for surgery.  I have reviewed the patient's chart and labs.  Questions were answered to the patient's satisfaction.     Jyssica Rief G

## 2016-10-13 ENCOUNTER — Telehealth: Payer: Self-pay | Admitting: General Surgery

## 2016-10-13 ENCOUNTER — Encounter: Payer: Self-pay | Admitting: General Surgery

## 2016-10-13 LAB — SURGICAL PATHOLOGY

## 2016-10-13 NOTE — Telephone Encounter (Signed)
Note faxed.

## 2016-10-13 NOTE — Telephone Encounter (Signed)
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

## 2016-10-19 ENCOUNTER — Ambulatory Visit (INDEPENDENT_AMBULATORY_CARE_PROVIDER_SITE_OTHER): Payer: Managed Care, Other (non HMO) | Admitting: General Surgery

## 2016-10-19 ENCOUNTER — Encounter: Payer: Self-pay | Admitting: General Surgery

## 2016-10-19 VITALS — BP 124/72 | HR 82 | Resp 12 | Ht 67.0 in | Wt 189.0 lb

## 2016-10-19 DIAGNOSIS — C50511 Malignant neoplasm of lower-outer quadrant of right female breast: Secondary | ICD-10-CM

## 2016-10-19 NOTE — Patient Instructions (Addendum)
Return in one month  The patient is scheduled to see Dr Rogue Bussing at [facility] on 10/26/16 at 1:30 pm. She is aware of date, time, and instructions.

## 2016-10-19 NOTE — Progress Notes (Signed)
Patient ID: Shelby Barry, female   DOB: 09-28-1966, 50 y.o.   MRN: [id number]  Chief Complaint  Patient presents with  . Routine Post Op    right lumpectomy    HPI Shelby Barry is a 50 y.o. female here today for her post op right lumpectomy done on 10/12/2016. Patient states she is doing well.  I have reviewed the history of present illness with the patient.  HPI  Past Medical History:  Diagnosis Date  . Anemia   . Anxiety   . Bursitis of right shoulder   . Cancer ([place])   . Cataract, bilateral   . Cold    states she has had it for 2 months  . Depression   . GERD (gastroesophageal reflux disease)    uses baking soda for symptoms  . Insomnia   . Lumbago   . Vitamin D deficiency     Past Surgical History:  Procedure Laterality Date  . ABDOMINAL HYSTERECTOMY  04/20/2014  . BILATERAL SALPINGECTOMY    . BREAST LUMPECTOMY WITH SENTINEL LYMPH NODE BIOPSY Right 10/12/2016   Procedure: BREAST LUMPECTOMY WITH SENTINEL LYMPH NODE BX;  Surgeon: Christene Lye, MD;  Location: [facility];  Service: General;  Laterality: Right;  . COLONOSCOPY WITH PROPOFOL N/A 05/20/2016   Procedure: COLONOSCOPY WITH PROPOFOL;  Surgeon: Lucilla Lame, MD;  Location: [place];  Service: Endoscopy;  Laterality: N/A;  . ECTOPIC PREGNANCY SURGERY    . ENDOMETRIAL ABLATION    . TUBAL LIGATION      Family History  Problem Relation Age of Onset  . Depression Father   . Obesity Father   . Alcohol abuse Brother   . Seizures Brother   . Breast cancer Sister 58    Social History Social History  Substance Use Topics  . Smoking status: Former Smoker    Packs/day: 0.50    Years: 20.00    Types: Cigarettes    Quit date: 08/23/2016  . Smokeless tobacco: Never Used  . Alcohol use 0.0 oz/week     Comment: occassional    Allergies  Allergen Reactions  . Adhesive [Tape] Other (See Comments)    Blisters/rash  PLEASE USE PAPER TAPE ONLY  . Nickel     PT CAN WEAR GOLD, STERLING SILVER  WITH NO PROBLEM-PT HAS NEVER HAD ANY PROBLEMS IN THE OR    Current Outpatient Prescriptions  Medication Sig Dispense Refill  . meloxicam (MOBIC) 15 MG tablet Take 1 tablet (15 mg total) by mouth daily. (Patient taking differently: Take 15 mg by mouth daily as needed for pain. ) 30 tablet 2  . Multiple Vitamin (MULTIVITAMIN WITH MINERALS) TABS tablet Take 1 tablet by mouth daily. One-A-Day    . Multiple Vitamins-Minerals (HAIR SKIN NAILS PO) Take 1 tablet by mouth daily.    . traMADol (ULTRAM) 50 MG tablet Take 1 tablet (50 mg total) by mouth every 6 (six) hours as needed. 30 tablet 0  . zolpidem (AMBIEN) 10 MG tablet Take 1 tablet (10 mg total) by mouth at bedtime. 30 tablet 2   No current facility-administered medications for this visit.     Review of Systems Review of Systems  Constitutional: Negative.   Respiratory: Negative.   Cardiovascular: Negative.     Blood pressure 124/72, pulse 82, resp. rate 12, height 5\' 7"  (1.702 m), weight 189 lb (85.7 kg).  Physical Exam Physical Exam  Constitutional: She is oriented to person, place, and time. She appears well-developed and well-nourished.  Pulmonary/Chest:  Inci  Neurological: She is alert and oriented to person, place, and time.  Skin: Skin is warm and dry.    Data Reviewed Path- invasive CA right breast 83mm size, 1/3 SN positive for mets > 8mm  Assessment    T1c,N1 right breast CA. ER/PR pos, her 2 neg. She will need chemo. Pt advised and arranged for oncology consult. Also advised on need for port placement if she agrees to chemo. Radiation will follow after chemo      Plan        The patient is scheduled to see Dr Rogue Bussing at [facility] on 10/26/16 at 1:30 pm. She is aware of date, time, and instructions.   This information has been scribed by Gaspar Cola CMA.   Shelby Barry 10/25/2016, 11:05 AM

## 2016-10-20 NOTE — Anesthesia Postprocedure Evaluation (Signed)
Anesthesia Post Note  [name]  Procedure(s) Performed: Procedure(s) (LRB): BREAST LUMPECTOMY WITH SENTINEL LYMPH NODE BX (Right)  Patient location during evaluation: PACU Anesthesia Type: General Level of consciousness: awake Pain management: pain level controlled Vital Signs Assessment: post-procedure vital signs reviewed and stable Respiratory status: spontaneous breathing Cardiovascular status: stable Anesthetic complications: no    Last Vitals:  Vitals:   10/12/16 1443 10/12/16 1525  BP: (!) 142/77 128/77  Pulse: 83 82  Resp: 18 18  Temp: 36.2 C     Last Pain:  Vitals:   10/13/16 0837  TempSrc:   PainSc: 4                  VAN STAVEREN,Liah Morr

## 2016-10-21 ENCOUNTER — Telehealth: Payer: Self-pay | Admitting: *Deleted

## 2016-10-21 NOTE — Telephone Encounter (Addendum)
Tried to call patient to establish navigation services, but no answer and her voicemail was full.  Will try again later.  Patient returned my call.  Review navigation services.  Will give patient her educational literature when she sees Dr. Rogue Bussing on Tuesday, 10/26/16.

## 2016-10-25 ENCOUNTER — Encounter: Payer: Self-pay | Admitting: General Surgery

## 2016-10-26 ENCOUNTER — Other Ambulatory Visit: Payer: Self-pay | Admitting: Internal Medicine

## 2016-10-26 ENCOUNTER — Inpatient Hospital Stay: Payer: Managed Care, Other (non HMO) | Attending: Internal Medicine | Admitting: Internal Medicine

## 2016-10-26 DIAGNOSIS — G4700 Insomnia, unspecified: Secondary | ICD-10-CM | POA: Insufficient documentation

## 2016-10-26 DIAGNOSIS — F17210 Nicotine dependence, cigarettes, uncomplicated: Secondary | ICD-10-CM | POA: Insufficient documentation

## 2016-10-26 DIAGNOSIS — Z79899 Other long term (current) drug therapy: Secondary | ICD-10-CM | POA: Insufficient documentation

## 2016-10-26 DIAGNOSIS — K219 Gastro-esophageal reflux disease without esophagitis: Secondary | ICD-10-CM | POA: Insufficient documentation

## 2016-10-26 DIAGNOSIS — C50511 Malignant neoplasm of lower-outer quadrant of right female breast: Secondary | ICD-10-CM | POA: Insufficient documentation

## 2016-10-26 DIAGNOSIS — E559 Vitamin D deficiency, unspecified: Secondary | ICD-10-CM | POA: Insufficient documentation

## 2016-10-26 DIAGNOSIS — Z170 Estrogen receptor positive status [ER+]: Secondary | ICD-10-CM | POA: Diagnosis not present

## 2016-10-26 DIAGNOSIS — J219 Acute bronchiolitis, unspecified: Secondary | ICD-10-CM | POA: Diagnosis not present

## 2016-10-26 DIAGNOSIS — K769 Liver disease, unspecified: Secondary | ICD-10-CM | POA: Insufficient documentation

## 2016-10-26 DIAGNOSIS — F419 Anxiety disorder, unspecified: Secondary | ICD-10-CM | POA: Insufficient documentation

## 2016-10-26 DIAGNOSIS — F329 Major depressive disorder, single episode, unspecified: Secondary | ICD-10-CM | POA: Insufficient documentation

## 2016-10-26 LAB — COMPREHENSIVE METABOLIC PANEL
ALT: 24 U/L (ref 14–54)
AST: 23 U/L (ref 15–41)
Albumin: 4.1 g/dL (ref 3.5–5.0)
Alkaline Phosphatase: 77 U/L (ref 38–126)
Anion gap: 5 (ref 5–15)
BILIRUBIN TOTAL: 0.6 mg/dL (ref 0.3–1.2)
BUN: 8 mg/dL (ref 6–20)
CALCIUM: 8.9 mg/dL (ref 8.9–10.3)
CO2: 28 mmol/L (ref 22–32)
CREATININE: 0.63 mg/dL (ref 0.44–1.00)
Chloride: 106 mmol/L (ref 101–111)
GFR calc Af Amer: >60 mL/min (ref >60)
Glucose, Bld: 94 mg/dL (ref 65–99)
POTASSIUM: 3.6 mmol/L (ref 3.5–5.1)
Sodium: 139 mmol/L (ref 135–145)
TOTAL PROTEIN: 7 g/dL (ref 6.5–8.1)

## 2016-10-26 LAB — CBC WITH DIFFERENTIAL/PLATELET
Basophils Absolute: 0.1 10*3/uL (ref 0–0.1)
Basophils Relative: 1 %
Eosinophils Absolute: 0.3 10*3/uL (ref 0–0.7)
Eosinophils Relative: 4 %
HEMATOCRIT: 41.3 % (ref 35.0–47.0)
Hemoglobin: 13.8 g/dL (ref 12.0–16.0)
LYMPHS PCT: 28 %
Lymphs Abs: 2.2 10*3/uL (ref 1.0–3.6)
MCH: 28.2 pg (ref 26.0–34.0)
MCHC: 33.5 g/dL (ref 32.0–36.0)
MCV: 84.2 fL (ref 80.0–100.0)
MONO ABS: 0.4 10*3/uL (ref 0.2–0.9)
MONOS PCT: 5 %
NEUTROS ABS: 4.8 10*3/uL (ref 1.4–6.5)
Neutrophils Relative %: 62 %
Platelets: 277 10*3/uL (ref 150–440)
RBC: 4.91 MIL/uL (ref 3.80–5.20)
RDW: 13.3 % (ref 11.5–14.5)
WBC: 7.8 10*3/uL (ref 3.6–11.0)

## 2016-10-26 MED ORDER — PROCHLORPERAZINE MALEATE 10 MG PO TABS: 10.0000 mg | ORAL_TABLET | Freq: Four times a day (QID) | ORAL | 1 refills | Status: DC | PRN

## 2016-10-26 MED ORDER — ONDANSETRON HCL 8 MG PO TABS: 8.0000 mg | ORAL_TABLET | Freq: Three times a day (TID) | ORAL | 1 refills | Status: DC | PRN

## 2016-10-26 MED ORDER — LIDOCAINE-PRILOCAINE 2.5-2.5 % EX CREA: 1.0000 "application " | TOPICAL_CREAM | CUTANEOUS | 0 refills | Status: DC | PRN

## 2016-10-26 NOTE — Patient Instructions (Signed)
Doxorubicin injection What is this medicine? DOXORUBICIN (dox oh ROO bi sin) is a chemotherapy drug. It is used to treat many kinds of cancer like leukemia, lymphoma, neuroblastoma, sarcoma, and Wilms' tumor. It is also used to treat bladder cancer, breast cancer, lung cancer, ovarian cancer, stomach cancer, and thyroid cancer. This medicine may be used for other purposes; ask your health care provider or pharmacist if you have questions. COMMON BRAND NAME(S): Adriamycin, Adriamycin PFS, Adriamycin RDF, Rubex What should I tell my health care provider before I take this medicine? They need to know if you have any of these conditions: -heart disease -history of low blood counts caused by a medicine -liver disease -recent or ongoing radiation therapy -an unusual or allergic reaction to doxorubicin, other chemotherapy agents, other medicines, foods, dyes, or preservatives -pregnant or trying to get pregnant -breast-feeding How should I use this medicine? This drug is given as an infusion into a vein. It is administered in a hospital or clinic by a specially trained health care professional. If you have pain, swelling, burning or any unusual feeling around the site of your injection, tell your health care professional right away. Talk to your pediatrician regarding the use of this medicine in children. Special care may be needed. Overdosage: If you think you have taken too much of this medicine contact a poison control center or emergency room at once. NOTE: This medicine is only for you. Do not share this medicine with others. What if I miss a dose? It is important not to miss your dose. Call your doctor or health care professional if you are unable to keep an appointment. What may interact with this medicine? This medicine may interact with the following medications: -6-mercaptopurine -paclitaxel -phenytoin -St. John's Wort -trastuzumab -verapamil This list may not describe all possible  interactions. Give your health care provider a list of all the medicines, herbs, non-prescription drugs, or dietary supplements you use. Also tell them if you smoke, drink alcohol, or use illegal drugs. Some items may interact with your medicine. What should I watch for while using this medicine? This drug may make you feel generally unwell. This is not uncommon, as chemotherapy can affect healthy cells as well as cancer cells. Report any side effects. Continue your course of treatment even though you feel ill unless your doctor tells you to stop. There is a maximum amount of this medicine you should receive throughout your life. The amount depends on the medical condition being treated and your overall health. Your doctor will watch how much of this medicine you receive in your lifetime. Tell your doctor if you have taken this medicine before. You may need blood work done while you are taking this medicine. Your urine may turn red for a few days after your dose. This is not blood. If your urine is dark or brown, call your doctor. In some cases, you may be given additional medicines to help with side effects. Follow all directions for their use. Call your doctor or health care professional for advice if you get a fever, chills or sore throat, or other symptoms of a cold or flu. Do not treat yourself. This drug decreases your body's ability to fight infections. Try to avoid being around people who are sick. This medicine may increase your risk to bruise or bleed. Call your doctor or health care professional if you notice any unusual bleeding. Talk to your doctor about your risk of cancer. You may be more at risk for certain   types of cancers if you take this medicine. Do not become pregnant while taking this medicine or for 6 months after stopping it. Women should inform their doctor if they wish to become pregnant or think they might be pregnant. Men should not father a child while taking this medicine and  for 6 months after stopping it. There is a potential for serious side effects to an unborn child. Talk to your health care professional or pharmacist for more information. Do not breast-feed an infant while taking this medicine. This medicine has caused ovarian failure in some women and reduced sperm counts in some men This medicine may interfere with the ability to have a child. Talk with your doctor or health care professional if you are concerned about your fertility. What side effects may I notice from receiving this medicine? Side effects that you should report to your doctor or health care professional as soon as possible: -allergic reactions like skin rash, itching or hives, swelling of the face, lips, or tongue -breathing problems -chest pain -fast or irregular heartbeat -low blood counts - this medicine may decrease the number of white blood cells, red blood cells and platelets. You may be at increased risk for infections and bleeding. -pain, redness, or irritation at site where injected -signs of infection - fever or chills, cough, sore throat, pain or difficulty passing urine -signs of decreased platelets or bleeding - bruising, pinpoint red spots on the skin, black, tarry stools, blood in the urine -swelling of the ankles, feet, hands -tiredness -weakness Side effects that usually do not require medical attention (report to your doctor or health care professional if they continue or are bothersome): -diarrhea -hair loss -mouth sores -nail discoloration or damage -nausea -red colored urine -vomiting This list may not describe all possible side effects. Call your doctor for medical advice about side effects. You may report side effects to FDA at 1-800-FDA-1088. Where should I keep my medicine? This drug is given in a hospital or clinic and will not be stored at home. NOTE: This sheet is a summary. It may not cover all possible information. If you have questions about this  medicine, talk to your doctor, pharmacist, or health care provider.  2017 [employer]/Gold Standard (2016-01-05 11:28:51) Cyclophosphamide injection What is this medicine? CYCLOPHOSPHAMIDE (sye kloe FOSS fa mide) is a chemotherapy drug. It slows the growth of cancer cells. This medicine is used to treat many types of cancer like lymphoma, myeloma, leukemia, breast cancer, and ovarian cancer, to name a few. This medicine may be used for other purposes; ask your health care provider or pharmacist if you have questions. COMMON BRAND NAME(S): Cytoxan, Neosar What should I tell my health care provider before I take this medicine? They need to know if you have any of these conditions: -blood disorders -history of other chemotherapy -infection -kidney disease -liver disease -recent or ongoing radiation therapy -tumors in the bone marrow -an unusual or allergic reaction to cyclophosphamide, other chemotherapy, other medicines, foods, dyes, or preservatives -pregnant or trying to get pregnant -breast-feeding How should I use this medicine? This drug is usually given as an injection into a vein or muscle or by infusion into a vein. It is administered in a hospital or clinic by a specially trained health care professional. Talk to your pediatrician regarding the use of this medicine in children. Special care may be needed. Overdosage: If you think you have taken too much of this medicine contact a poison control center or emergency room at  once. NOTE: This medicine is only for you. Do not share this medicine with others. What if I miss a dose? It is important not to miss your dose. Call your doctor or health care professional if you are unable to keep an appointment. What may interact with this medicine? This medicine may interact with the following medications: -amiodarone -amphotericin B -azathioprine -certain antiviral medicines for HIV or AIDS such as protease inhibitors (e.g., indinavir,  ritonavir) and zidovudine -certain blood pressure medications such as benazepril, captopril, enalapril, fosinopril, lisinopril, moexipril, monopril, perindopril, quinapril, ramipril, trandolapril -certain cancer medications such as anthracyclines (e.g., daunorubicin, doxorubicin), busulfan, cytarabine, paclitaxel, pentostatin, tamoxifen, trastuzumab -certain diuretics such as chlorothiazide, chlorthalidone, hydrochlorothiazide, indapamide, metolazone -certain medicines that treat or prevent blood clots like warfarin -certain muscle relaxants such as succinylcholine -cyclosporine -etanercept -indomethacin -medicines to increase blood counts like filgrastim, pegfilgrastim, sargramostim -medicines used as general anesthesia -metronidazole -natalizumab This list may not describe all possible interactions. Give your health care provider a list of all the medicines, herbs, non-prescription drugs, or dietary supplements you use. Also tell them if you smoke, drink alcohol, or use illegal drugs. Some items may interact with your medicine. What should I watch for while using this medicine? Visit your doctor for checks on your progress. This drug may make you feel generally unwell. This is not uncommon, as chemotherapy can affect healthy cells as well as cancer cells. Report any side effects. Continue your course of treatment even though you feel ill unless your doctor tells you to stop. Drink water or other fluids as directed. Urinate often, even at night. In some cases, you may be given additional medicines to help with side effects. Follow all directions for their use. Call your doctor or health care professional for advice if you get a fever, chills or sore throat, or other symptoms of a cold or flu. Do not treat yourself. This drug decreases your body's ability to fight infections. Try to avoid being around people who are sick. This medicine may increase your risk to bruise or bleed. Call your doctor or  health care professional if you notice any unusual bleeding. Be careful brushing and flossing your teeth or using a toothpick because you may get an infection or bleed more easily. If you have any dental work done, tell your dentist you are receiving this medicine. You may get drowsy or dizzy. Do not drive, use machinery, or do anything that needs mental alertness until you know how this medicine affects you. Do not become pregnant while taking this medicine or for 1 year after stopping it. Women should inform their doctor if they wish to become pregnant or think they might be pregnant. Men should not father a child while taking this medicine and for 4 months after stopping it. There is a potential for serious side effects to an unborn child. Talk to your health care professional or pharmacist for more information. Do not breast-feed an infant while taking this medicine. This medicine may interfere with the ability to have a child. This medicine has caused ovarian failure in some women. This medicine has caused reduced sperm counts in some men. You should talk with your doctor or health care professional if you are concerned about your fertility. If you are going to have surgery, tell your doctor or health care professional that you have taken this medicine. What side effects may I notice from receiving this medicine? Side effects that you should report to your doctor or health care professional as   soon as possible: -allergic reactions like skin rash, itching or hives, swelling of the face, lips, or tongue -low blood counts - this medicine may decrease the number of white blood cells, red blood cells and platelets. You may be at increased risk for infections and bleeding. -signs of infection - fever or chills, cough, sore throat, pain or difficulty passing urine -signs of decreased platelets or bleeding - bruising, pinpoint red spots on the skin, black, tarry stools, blood in the urine -signs of  decreased red blood cells - unusually weak or tired, fainting spells, lightheadedness -breathing problems -dark urine -dizziness -palpitations -swelling of the ankles, feet, hands -trouble passing urine or change in the amount of urine -weight gain -yellowing of the eyes or skin Side effects that usually do not require medical attention (report to your doctor or health care professional if they continue or are bothersome): -changes in nail or skin color -hair loss -missed menstrual periods -mouth sores -nausea, vomiting This list may not describe all possible side effects. Call your doctor for medical advice about side effects. You may report side effects to FDA at 1-800-FDA-1088. Where should I keep my medicine? This drug is given in a hospital or clinic and will not be stored at home. NOTE: This sheet is a summary. It may not cover all possible information. If you have questions about this medicine, talk to your doctor, pharmacist, or health care provider.  2017 [employer]/Gold Standard (2012-09-22 16:22:58) Paclitaxel injection What is this medicine? PACLITAXEL (PAK li TAX el) is a chemotherapy drug. It targets fast dividing cells, like cancer cells, and causes these cells to die. This medicine is used to treat ovarian cancer, breast cancer, and other cancers. This medicine may be used for other purposes; ask your health care provider or pharmacist if you have questions. COMMON BRAND NAME(S): Onxol, Taxol What should I tell my health care provider before I take this medicine? They need to know if you have any of these conditions: -blood disorders -irregular heartbeat -infection (especially a virus infection such as chickenpox, cold sores, or herpes) -liver disease -previous or ongoing radiation therapy -an unusual or allergic reaction to paclitaxel, alcohol, polyoxyethylated castor oil, other chemotherapy agents, other medicines, foods, dyes, or preservatives -pregnant or trying to  get pregnant -breast-feeding How should I use this medicine? This drug is given as an infusion into a vein. It is administered in a hospital or clinic by a specially trained health care professional. Talk to your pediatrician regarding the use of this medicine in children. Special care may be needed. Overdosage: If you think you have taken too much of this medicine contact a poison control center or emergency room at once. NOTE: This medicine is only for you. Do not share this medicine with others. What if I miss a dose? It is important not to miss your dose. Call your doctor or health care professional if you are unable to keep an appointment. What may interact with this medicine? Do not take this medicine with any of the following medications: -disulfiram -metronidazole This medicine may also interact with the following medications: -cyclosporine -diazepam -ketoconazole -medicines to increase blood counts like filgrastim, pegfilgrastim, sargramostim -other chemotherapy drugs like cisplatin, doxorubicin, epirubicin, etoposide, teniposide, vincristine -quinidine -testosterone -vaccines -verapamil Talk to your doctor or health care professional before taking any of these medicines: -acetaminophen -aspirin -ibuprofen -ketoprofen -naproxen This list may not describe all possible interactions. Give your health care provider a list of all the medicines, herbs, non-prescription drugs, or  dietary supplements you use. Also tell them if you smoke, drink alcohol, or use illegal drugs. Some items may interact with your medicine. What should I watch for while using this medicine? Your condition will be monitored carefully while you are receiving this medicine. You will need important blood work done while you are taking this medicine. This medicine can cause serious allergic reactions. To reduce your risk you will need to take other medicine(s) before treatment with this medicine. If you  experience allergic reactions like skin rash, itching or hives, swelling of the face, lips, or tongue, tell your doctor or health care professional right away. In some cases, you may be given additional medicines to help with side effects. Follow all directions for their use. This drug may make you feel generally unwell. This is not uncommon, as chemotherapy can affect healthy cells as well as cancer cells. Report any side effects. Continue your course of treatment even though you feel ill unless your doctor tells you to stop. Call your doctor or health care professional for advice if you get a fever, chills or sore throat, or other symptoms of a cold or flu. Do not treat yourself. This drug decreases your body's ability to fight infections. Try to avoid being around people who are sick. This medicine may increase your risk to bruise or bleed. Call your doctor or health care professional if you notice any unusual bleeding. Be careful brushing and flossing your teeth or using a toothpick because you may get an infection or bleed more easily. If you have any dental work done, tell your dentist you are receiving this medicine. Avoid taking products that contain aspirin, acetaminophen, ibuprofen, naproxen, or ketoprofen unless instructed by your doctor. These medicines may hide a fever. Do not become pregnant while taking this medicine. Women should inform their doctor if they wish to become pregnant or think they might be pregnant. There is a potential for serious side effects to an unborn child. Talk to your health care professional or pharmacist for more information. Do not breast-feed an infant while taking this medicine. Men are advised not to father a child while receiving this medicine. This product may contain alcohol. Ask your pharmacist or healthcare provider if this medicine contains alcohol. Be sure to tell all healthcare providers you are taking this medicine. Certain medicines, like metronidazole  and disulfiram, can cause an unpleasant reaction when taken with alcohol. The reaction includes flushing, headache, nausea, vomiting, sweating, and increased thirst. The reaction can last from 30 minutes to several hours. What side effects may I notice from receiving this medicine? Side effects that you should report to your doctor or health care professional as soon as possible: -allergic reactions like skin rash, itching or hives, swelling of the face, lips, or tongue -low blood counts - This drug may decrease the number of white blood cells, red blood cells and platelets. You may be at increased risk for infections and bleeding. -signs of infection - fever or chills, cough, sore throat, pain or difficulty passing urine -signs of decreased platelets or bleeding - bruising, pinpoint red spots on the skin, black, tarry stools, nosebleeds -signs of decreased red blood cells - unusually weak or tired, fainting spells, lightheadedness -breathing problems -chest pain -high or low blood pressure -mouth sores -nausea and vomiting -pain, swelling, redness or irritation at the injection site -pain, tingling, numbness in the hands or feet -slow or irregular heartbeat -swelling of the ankle, feet, hands Side effects that usually do not  require medical attention (report to your doctor or health care professional if they continue or are bothersome): -bone pain -complete hair loss including hair on your head, underarms, pubic hair, eyebrows, and eyelashes -changes in the color of fingernails -diarrhea -loosening of the fingernails -loss of appetite -muscle or joint pain -red flush to skin -sweating This list may not describe all possible side effects. Call your doctor for medical advice about side effects. You may report side effects to FDA at 1-800-FDA-1088. Where should I keep my medicine? This drug is given in a hospital or clinic and will not be stored at home. NOTE: This sheet is a summary. It  may not cover all possible information. If you have questions about this medicine, talk to your doctor, pharmacist, or health care provider.  2017 [employer]/Gold Standard (2015-09-09 19:58:00)

## 2016-10-26 NOTE — Progress Notes (Signed)
START ON PATHWAY REGIMEN - Breast  [id number]: Dose-Dense AC-T (Paclitaxel Weekly) - [Doxorubicin + Cyclophosphamide q14 Days x 4 Cycles, Followed by Paclitaxel 80 mg/m2 Weekly x 12 Weeks]  Dose-Dense AC q14 days:   A cycle is every 14 days:     Doxorubicin (Adriamycin(R)) 60 mg/m2 IV push on day 1 only. Dose Mod: None     Cyclophosphamide (Cytoxan(R)) 600 mg/m2 in 250 mL NS IV over 30 minutes on day 1 only. Dose Mod: None     Pegfilgrastim (Neulasta(R)) 6 mg flat dose subcutaneously once on day 2 only.  G-CSF recommended due to data showing a >20% risk of febrile neutropenia. Dose Mod: None  **Always confirm dose/schedule in your pharmacy ordering system**    Paclitaxel 80 mg/m2 Weekly:   Administer weekly:     Paclitaxel (Taxol(R)) 80 mg/m2 in 250 mL NS IV over 1 hour Dose Mod: None  **Always confirm dose/schedule in your pharmacy ordering system**    Patient Characteristics: Adjuvant Therapy, Node Positive (1-3), HER2/neu Negative/Unknown/Equivocal, ER Positive, Genomic Testing Not Performed AJCC Stage Grouping: IIA Current Disease Status: No Distant Mets or Local Recurrence AJCC M Stage: 0 ER Status: Positive (+) AJCC N Stage: 1 AJCC T Stage: 1c HER2/neu: Negative (-) PR Status: Positive (+) Node Status: Positive (+) (1-3 Nodes) Has this patient completed genomic testing? No - Did Not Order Test   Intent of Therapy: Curative Intent, Discussed with Patient

## 2016-10-26 NOTE — Assessment & Plan Note (Addendum)
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 [id number]. I would recommend adjuvant chemotherapy with dose dense Adriamycin and Cytoxan every 2 weeks 4 followed by Taxol weekly 12.  # Discussed the potential side effects including but not limited to-increasing fatigue, nausea vomiting, diarrhea, hair loss, sores in the mouth, increase risk of infection and also neuropathy.   Growth factor-Neulasta/On pro would be given as prophylaxis for chemotherapy-induced neutropenia to prevent febrile neutropenias.   # PREVENT STUDY- discussed regarding anthracycline cardiotoxicity prevention using statin. Discussed that she will need MRI of the heart prior. Patient is interested. We'll refer the patient to clinical trials.  # Genetic counseling- patient will need genetic counseling/genetic testing given history of breast cancer.   # Check labs today; follow-up in week off 18th to start chemotherapy. Communicated to Dr. Sankar re: port placement.  # Chemotherapy education; Will send prescription for EMLA cream/nausea medications.  Thank you Dr. Sankar for allowing me to participate in the care of your pleasant patient. Please do not hesitate to contact me with questions or concerns in the interim. 

## 2016-10-26 NOTE — Progress Notes (Signed)
Pt here for new pt visit

## 2016-10-26 NOTE — Progress Notes (Signed)
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  female no significant past medical history- underwent a screening mammogram that was abnormal. 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 Patient went on to have sentinel lymph node biopsy and lumpectomy. Patient is here to discuss adjuvant therapy.  Patient complains of mild numbness in the right underarm.; However this is improving. Denies any fevers or chills. Postoperative/lumpectomy course has been fairly uneventful. Denies any no unusual headaches or unusual pain anywhere.   ROS: A complete 10 point review of system is done which is negative except mentioned above in history of present illness  MEDICAL HISTORY:  Past Medical History:  Diagnosis Date  . Anemia   . Anxiety   . Bursitis of right shoulder   . Cancer ([place])   . Cataract, bilateral   . Cold    states she has had it for 2 months  . Depression   . GERD (gastroesophageal reflux disease)    uses baking soda for symptoms  . Insomnia   . Lumbago   . Vitamin D deficiency     SURGICAL HISTORY: Past Surgical History:  Procedure Laterality  Date  . ABDOMINAL HYSTERECTOMY  04/20/2014  . BILATERAL SALPINGECTOMY    . BREAST LUMPECTOMY WITH SENTINEL LYMPH NODE BIOPSY Right 10/12/2016   Procedure: BREAST LUMPECTOMY WITH SENTINEL LYMPH NODE BX;  Surgeon: Christene Lye, MD;  Location: [facility];  Service: General;  Laterality: Right;  . COLONOSCOPY WITH PROPOFOL N/A 05/20/2016   Procedure: COLONOSCOPY WITH PROPOFOL;  Surgeon: Lucilla Lame, MD;  Location: [place];  Service: Endoscopy;  Laterality: N/A;  . ECTOPIC PREGNANCY SURGERY    . ENDOMETRIAL ABLATION    . TUBAL LIGATION      SOCIAL HISTORY: [employer]; in [place]; 1 pack/day; occassional alochol.  2 grown- children- 23 boy & 50 daughter Social History   Social History  . Marital status: Divorced    Spouse name: N/A  . Number of children: N/A  . Years of education: N/A   Occupational History  . Not on file.   Social History Main Topics  . Smoking status: Former Smoker    Packs/day: 0.50    Years: 20.00    Types: Cigarettes    Quit date: 08/23/2016  . Smokeless tobacco: Never Used  . Alcohol use 0.0 oz/week     Comment: occassional  . Drug use: No  . Sexual activity: Yes    Partners: Male   Other Topics Concern  . Not on file   Social History Narrative  . No narrative on file    FAMILY HISTORY: 2 sisters- one sister breast ca at ? 85y; other  sister- ? Cancer; aunt/mom's sister- stomach cancer [60s].  Family History  Problem Relation Age of Onset  . Depression Father   . Obesity Father   . Alcohol abuse Brother   . Seizures Brother   . Breast cancer Sister 45    ALLERGIES:  is allergic to adhesive [tape] and nickel.  MEDICATIONS:  Current Outpatient Prescriptions  Medication Sig Dispense Refill  . Multiple Vitamin (MULTIVITAMIN WITH MINERALS) TABS tablet Take 1 tablet by mouth daily. One-A-Day    . Multiple Vitamins-Minerals (HAIR SKIN NAILS PO) Take 1 tablet by mouth daily.    Marland Kitchen lidocaine-prilocaine (EMLA) cream Apply 1  application topically as needed. Apply generously over the Mediport 45 minutes prior to chemotherapy. 30 g 0  . meloxicam (MOBIC) 15 MG tablet Take 1 tablet (15 mg total) by mouth daily. (Patient not taking: Reported on 10/26/2016) 30 tablet 2  . ondansetron (ZOFRAN) 8 MG tablet Take 1 tablet (8 mg total) by mouth every 8 (eight) hours as needed for nausea or vomiting (start 3 days; after chemo). 40 tablet 1  . prochlorperazine (COMPAZINE) 10 MG tablet Take 1 tablet (10 mg total) by mouth every 6 (six) hours as needed for nausea or vomiting. 40 tablet 1   No current facility-administered medications for this visit.       Marland Kitchen  PHYSICAL EXAMINATION: ECOG PERFORMANCE STATUS: 0 - Asymptomatic  Vitals:   10/26/16 1358  BP: 116/80  Pulse: 70  Temp: 98.7 F (37.1 C)   Filed Weights   10/26/16 1358  Weight: 189 lb 4.2 oz (85.9 kg)    GENERAL: Well-nourished well-developed; Alert, no distress and comfortable.   With family.  EYES: no pallor or icterus OROPHARYNX: no thrush or ulceration; good dentition  NECK: supple, no masses felt LYMPH:  no palpable lymphadenopathy in the cervical, axillary or inguinal regions LUNGS: clear to auscultation and  No wheeze or crackles HEART/CVS: regular rate & rhythm and no murmurs; No lower extremity edema ABDOMEN: abdomen soft, non-tender and normal bowel sounds Musculoskeletal:no cyanosis of digits and no clubbing  PSYCH: alert & oriented x 3 with fluent speech NEURO: no focal motor/sensory deficits SKIN:  no rashes or significant lesions  LABORATORY DATA:  I have reviewed the data as listed Lab Results  Component Value Date   WBC 7.8 10/26/2016   HGB 13.8 10/26/2016   HCT 41.3 10/26/2016   MCV 84.2 10/26/2016   PLT 277 10/26/2016    Recent Labs  09/30/16 1421  NA 146*  K 4.1  CL 101  CO2 28  GLUCOSE 107*  BUN 11  CREATININE 0.69  CALCIUM 9.6  GFRNONAA 102  GFRAA 117  PROT 6.7  ALBUMIN 4.3  AST 27  ALT 47*  ALKPHOS 105   BILITOT <0.2    RADIOGRAPHIC STUDIES: I have personally reviewed the radiological images as listed and agreed with the findings in the report. Nm Sentinel Node Injection  Result Date: 10/12/2016 CLINICAL DATA:  Right breast cancer. EXAM: NUCLEAR MEDICINE BREAST LYMPHOSCINTIGRAPHY TECHNIQUE: Intradermal injection of radiopharmaceutical was performed at the 9 o'clock position around the right nipple. The patient was then sent to the operating room where the sentinel node(s) were identified and removed by the surgeon. RADIOPHARMACEUTICALS:  Total of 1 mCi Millipore-filtered Technetium-35msulfur colloid IMPRESSION: Uncomplicated intradermal injection of a total of 1 mCi Technetium-975mulfur colloid for purposes of sentinel node identification. Electronically Signed   By: GlAletta Edouard.D.   On: 10/12/2016 10:16   UsKoreareast Complete  Uni Right Inc Axilla  Result Date: 09/27/2016 CORE BREAST BIOPSY REPORT Name:  CARLISSA PESOLA DOB:  08-Oct-1966  Lesion:  mass Location:   Right  8o'clock position 7cmfn Local anesthetic:   8 ml of 1% Xylocaine/0.5% Marcaine Prep: Chloro prep Device:  14Gauge   Bard   Ultrasound guidance:  used Patient tolerance:  Patient tolerated the procedure well Approach: LM Clip: placed-clip is 1cm lateral to mass, pt movement during deployment Dressing: steri stripss, telfa and tegaderm Ice pack applied. Written instructions provided to patient regarding wound care. Patient advised that patient will be contacted by phone when pathology report is available. Followup appointment to be scheduled after pathology. 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 PT1cpN1sn. I would recommend adjuvant chemotherapy with dose dense Adriamycin and Cytoxan every 2 weeks 4 followed by Taxol weekly 12.  # Discussed the  potential side effects including but not limited to-increasing fatigue, nausea vomiting, diarrhea, hair loss, sores in the mouth, increase risk of infection and also neuropathy.   Growth factor-Neulasta/On pro would be given as prophylaxis for chemotherapy-induced neutropenia to prevent febrile neutropenias.   # PREVENT STUDY- discussed regarding anthracycline cardiotoxicity prevention using statin. Discussed that she will need MRI of the heart prior. Patient is interested. We'll refer the patient to clinical trials.  # Genetic counseling- patient will need genetic counseling/genetic testing given history of breast cancer.   # Check labs today; follow-up in week off 18th to start chemotherapy. Communicated to Dr. Jamal Collin re: port placement.  # Chemotherapy education; Will send prescription for EMLA cream/nausea medications.  Thank you Dr. Jamal Collin for allowing me to participate in the care of your pleasant patient. Please do not hesitate to contact me with questions or concerns in the interim.  All questions were answered. The patient knows to call the clinic with any problems, questions or concerns.    Cammie Sickle, MD 10/26/2016 4:25 PM

## 2016-10-27 ENCOUNTER — Encounter: Payer: Self-pay | Admitting: *Deleted

## 2016-10-27 LAB — CANCER ANTIGEN 27.29: CA 27.29: 9.2 U/mL (ref 0.0–38.6)

## 2016-10-27 LAB — CANCER ANTIGEN 15-3: CAN 15 3: 14.9 U/mL (ref 0.0–25.0)

## 2016-10-27 LAB — CEA: CEA: 1.9 ng/mL (ref 0.0–4.7)

## 2016-10-27 NOTE — Progress Notes (Signed)
Patient's port placement has been scheduled for 11-04-16 at [facility].

## 2016-10-28 ENCOUNTER — Other Ambulatory Visit: Payer: Self-pay | Admitting: General Surgery

## 2016-10-28 ENCOUNTER — Inpatient Hospital Stay: Payer: Managed Care, Other (non HMO)

## 2016-10-28 ENCOUNTER — Encounter: Payer: Self-pay | Admitting: *Deleted

## 2016-10-28 DIAGNOSIS — C50011 Malignant neoplasm of nipple and areola, right female breast: Secondary | ICD-10-CM

## 2016-10-28 DIAGNOSIS — Z170 Estrogen receptor positive status [ER+]: Principal | ICD-10-CM

## 2016-10-28 NOTE — Progress Notes (Signed)
Talked to patient today.  Asked if Dr. Rogue Bussing had discussed the possibility of genetic testing with her.  States, yes he did, and she is agreeable to testing.  States she can come in on Monday afternoon to have the blood drawn and complete the paperwork.  Checked with Marita [place] in pre-authorization, and she is not aware of needing pre-authorization with [employer].

## 2016-10-29 ENCOUNTER — Telehealth: Payer: Self-pay | Admitting: Internal Medicine

## 2016-10-29 ENCOUNTER — Other Ambulatory Visit: Payer: Self-pay | Admitting: Internal Medicine

## 2016-10-29 DIAGNOSIS — R05 Cough: Secondary | ICD-10-CM

## 2016-10-29 DIAGNOSIS — Z170 Estrogen receptor positive status [ER+]: Principal | ICD-10-CM

## 2016-10-29 DIAGNOSIS — C50511 Malignant neoplasm of lower-outer quadrant of right female breast: Secondary | ICD-10-CM

## 2016-10-29 DIAGNOSIS — R059 Cough, unspecified: Secondary | ICD-10-CM

## 2016-10-29 DIAGNOSIS — J42 Unspecified chronic bronchitis: Secondary | ICD-10-CM | POA: Insufficient documentation

## 2016-10-29 NOTE — Telephone Encounter (Signed)
Patient's call acknowledge. MD would like order a ct scan of chest to r/o metastatic disease to chest.  She states that thanked me for calling her back. This test will reassure her that she can proceed with her next chemotherapy without any delays. She is worried that she "has metastatic disease to chest."  I reassured pt that md will be scheduling this scan early next week to further evaluate her chest.

## 2016-10-29 NOTE — Telephone Encounter (Signed)
Pt wonders if she should have her chest scanned because she has so much coughing and has had it for a long time. Currently she has a respiratory infection but had the cough way before that. She is also wanting to talk to the nurse about whether the medicine she will be taking for chest infection is going to be a problem with her chemo. Please call to discuss. Thanks.

## 2016-11-01 ENCOUNTER — Encounter: Payer: Self-pay | Admitting: *Deleted

## 2016-11-01 NOTE — Progress Notes (Signed)
Patient came in today for blood draw for genetic testing.  Patient is a candidate for testing based on NCCN guidelines. Patient diagnosed with invasive breast cancer at age 50.   Family history includes: a sister diagnosed with breast cancer at age 97, a maternal aunt with breast cancer in her 29's, and a maternal aunt with stomach cancer.  Blood specimen collected by the lab and shipped to [facility] via [employer].  Patient informed that the results will come to Dr. Rogue Bussing in the next couple of weeks.

## 2016-11-02 ENCOUNTER — Encounter
Admission: RE | Admit: 2016-11-02 | Discharge: 2016-11-02 | Disposition: A | Discharge status: Home / Self Care | Payer: Managed Care, Other (non HMO) | Source: Ambulatory Visit | Attending: General Surgery | Admitting: General Surgery

## 2016-11-02 ENCOUNTER — Ambulatory Visit (INDEPENDENT_AMBULATORY_CARE_PROVIDER_SITE_OTHER): Payer: Managed Care, Other (non HMO) | Admitting: Family Medicine

## 2016-11-02 ENCOUNTER — Telehealth: Payer: Self-pay | Admitting: Family Medicine

## 2016-11-02 ENCOUNTER — Ambulatory Visit
Admission: RE | Admit: 2016-11-02 | Discharge: 2016-11-02 | Disposition: A | Discharge status: Home / Self Care | Payer: Managed Care, Other (non HMO) | Source: Ambulatory Visit | Attending: General Surgery | Admitting: General Surgery

## 2016-11-02 ENCOUNTER — Other Ambulatory Visit: Payer: Self-pay | Admitting: *Deleted

## 2016-11-02 ENCOUNTER — Other Ambulatory Visit: Payer: Self-pay | Admitting: Internal Medicine

## 2016-11-02 ENCOUNTER — Telehealth: Payer: Self-pay | Admitting: Internal Medicine

## 2016-11-02 ENCOUNTER — Encounter: Payer: Self-pay | Admitting: Family Medicine

## 2016-11-02 VITALS — BP 112/78 | HR 98 | Temp 97.8°F | Resp 16 | Wt 190.0 lb

## 2016-11-02 DIAGNOSIS — Z006 Encounter for examination for normal comparison and control in clinical research program: Secondary | ICD-10-CM

## 2016-11-02 DIAGNOSIS — C50511 Malignant neoplasm of lower-outer quadrant of right female breast: Secondary | ICD-10-CM

## 2016-11-02 DIAGNOSIS — Z170 Estrogen receptor positive status [ER+]: Secondary | ICD-10-CM | POA: Insufficient documentation

## 2016-11-02 DIAGNOSIS — R05 Cough: Secondary | ICD-10-CM | POA: Diagnosis not present

## 2016-11-02 DIAGNOSIS — R918 Other nonspecific abnormal finding of lung field: Secondary | ICD-10-CM | POA: Insufficient documentation

## 2016-11-02 DIAGNOSIS — R059 Cough, unspecified: Secondary | ICD-10-CM

## 2016-11-02 DIAGNOSIS — K7689 Other specified diseases of liver: Secondary | ICD-10-CM | POA: Diagnosis not present

## 2016-11-02 DIAGNOSIS — G4709 Other insomnia: Secondary | ICD-10-CM

## 2016-11-02 DIAGNOSIS — Z23 Encounter for immunization: Secondary | ICD-10-CM

## 2016-11-02 MED ORDER — IOPAMIDOL (ISOVUE-300) INJECTION 61%
75.0000 mL | Freq: Once | INTRAVENOUS | Status: AC | PRN
  Administered 2016-11-02: 75 mL via INTRAVENOUS

## 2016-11-02 MED ORDER — FLUTICASONE FUROATE-VILANTEROL 100-25 MCG/INH IN AEPB: 1.0000 | INHALATION_SPRAY | Freq: Every day | RESPIRATORY_TRACT | 0 refills | Status: DC

## 2016-11-02 MED ORDER — ZOLPIDEM TARTRATE 10 MG PO TABS
5.0000 mg | ORAL_TABLET | Freq: Every evening | ORAL | 0 refills | Status: DC | PRN
Start: 2016-11-02 — End: 2017-01-18

## 2016-11-02 MED ORDER — AZITHROMYCIN 250 MG PO TABS: ORAL_TABLET | ORAL | 0 refills | Status: DC

## 2016-11-02 NOTE — Telephone Encounter (Signed)
Pt was prescribed z-pack and fluticasone. It was sent to wrong pharmacy please send to [facility] [address]

## 2016-11-02 NOTE — Telephone Encounter (Signed)
T/C made to [place] following her conversation with Dr. Rogue Bussing regardingthe results of her CT performed this morning. Patient states Dr. Rogue Bussing is going to order a PET scan for her now. Instructed Ms. Holaday that Dr. Rogue Bussing also wants [place] to hold off on the research study until he completes this workup and patient voices understanding. Instructed Ms. Schoof that she does not have to come to the clinic in the morning for her fasting labs, and that we will reschedule that for a later date. Patient voices thankfulness at being able to take that off her plate for now. Will cancel patient's appointment for [place] study labs tomorrow morning. Yolande Jolly, BSN, MHA, OCN 11/02/2016 4:41 PM

## 2016-11-02 NOTE — Telephone Encounter (Signed)
Spoke to Dr.Sankar re: CT results- liver lesions. Plan PET scan.  Spoke to patient regarding the results of the CT scan; plan PET scan ASAP. Patient has been started on antibiotics azithromycin by PCP.   Hold off clinical trial participation given new findings; which will need further work up.   Heather- PET asap. Thx

## 2016-11-02 NOTE — Progress Notes (Addendum)
Name: Shelby Barry   MRN: [id number]    DOB: [birth year]   Date:11/02/2016       Progress Note  Subjective  Chief Complaint  Chief Complaint  Patient presents with  . URI    onset 3 days unchanged, symptoms include, cough, congestion,sore throat and hoarsness.  Currently taking nyquil  . Medication Refill  . Insomnia    wants refill on ambien    HPI  Cough: she quit smoking Dec 2016, she has noticed a productive cough on and off for the past 6 months. She goes about one month without symptoms and develops a dry cough but over the past few days it has been worse. She developed sore throat, hoarseness, nasal congestion, a dry cough cough and occasionally productive cough. She has some wheezing and SOB. No fever or chills. Normal appetite. She had CT done today and showed possible bronchitis or old infectious process on the left lower lung field  Insomnia: she would like a refill of Ambien, she has been out of medication and has difficulty falling and staying asleep. Discussed Ambien FDA change but she states she can't sleep on less than 10 mg per night. She denies side effects.   Breast cancer right : found on mammogram biopsy positive and is no seeing oncologist and Dr. Jamal Barry.    Patient Active Problem List   Diagnosis Date Noted  . Cough in adult 10/29/2016  . 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 Abnormal mammogram 09/22/2016  . Depression, major, recurrent, mild ([place]) 08/22/2015  . Bursitis of shoulder 08/22/2015  . Bilateral cataracts 08/22/2015  . Circadian rhythm sleep disorder, 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  . Vitamin D deficiency 08/22/2015  . LBP (low back pain) 08/22/2015  . GAD (generalized anxiety disorder) 08/22/2015  . Tobacco use 08/22/2015  . History of anemia 08/17/2007  . Overweight (BMI 25.0-29.9) 08/16/2007  . Insomnia 08/16/2007    Past Surgical History:  Procedure Laterality Date  .  ABDOMINAL HYSTERECTOMY  04/20/2014  . BILATERAL SALPINGECTOMY    . BREAST LUMPECTOMY WITH SENTINEL LYMPH NODE BIOPSY Right 10/12/2016   Procedure: BREAST LUMPECTOMY WITH SENTINEL LYMPH NODE BX;  Surgeon: Shelby Lye, MD;  Location: [facility];  Service: General;  Laterality: Right;  . COLONOSCOPY WITH PROPOFOL N/A 05/20/2016   Procedure: COLONOSCOPY WITH PROPOFOL;  Surgeon: Shelby Lame, MD;  Location: [place];  Service: Endoscopy;  Laterality: N/A;  . ECTOPIC PREGNANCY SURGERY    . ENDOMETRIAL ABLATION    . TUBAL LIGATION      Family History  Problem Relation Age of Onset  . Depression Father   . Obesity Father   . Alcohol abuse Brother   . Seizures Brother   . Breast cancer Sister 70    Social History   Social History  . Marital status: Divorced    Spouse name: N/A  . Number of children: N/A  . Years of education: N/A   Occupational History  . Not on file.   Social History Main Topics  . Smoking status: Former Smoker    Packs/day: 0.50    Years: 20.00    Types: Cigarettes    Quit date: 08/23/2016  . Smokeless tobacco: Never Used  . Alcohol use 0.0 oz/week     Comment: occassional  . Drug use: No  . Sexual activity: Yes    Partners: Male   Other Topics Concern  . Not on file  Social History Narrative   She is living with his Shelby Barry ( boyfriend ) for the past two years, and his 86 yo daughter just moved in with them Fall 2017. Her mother is doing drugs.    Works as [employer]     Current Outpatient Prescriptions:  .  azithromycin (ZITHROMAX Z-PAK) 250 MG tablet, Take as directed, Disp: 6 each, Rfl: 0 .  fluticasone furoate-vilanterol (BREO ELLIPTA) 100-25 MCG/INH AEPB, Inhale 1 puff into the lungs daily., Disp: 60 each, Rfl: 0 .  lidocaine-prilocaine (EMLA) cream, Apply 1 application topically as needed. Apply generously over the Mediport 45 minutes prior to chemotherapy. (Patient not taking: Reported on 10/28/2016), Disp: 30 g, Rfl: 0 .  meloxicam  (MOBIC) 15 MG tablet, Take 1 tablet (15 mg total) by mouth daily. (Patient not taking: Reported on 10/26/2016), Disp: 30 tablet, Rfl: 2 .  Multiple Vitamin (MULTIVITAMIN WITH MINERALS) TABS tablet, Take 1 tablet by mouth daily. One-A-Day, Disp: , Rfl:  .  ondansetron (ZOFRAN) 8 MG tablet, Take 1 tablet (8 mg total) by mouth every 8 (eight) hours as needed for nausea or vomiting (start 3 days; after chemo). (Patient not taking: Reported on 10/28/2016), Disp: 40 tablet, Rfl: 1 .  prochlorperazine (COMPAZINE) 10 MG tablet, Take 1 tablet (10 mg total) by mouth every 6 (six) hours as needed for nausea or vomiting. (Patient not taking: Reported on 10/28/2016), Disp: 40 tablet, Rfl: 1 .  zolpidem (AMBIEN) 10 MG tablet, Take 0.5-1 tablets (5-10 mg total) by mouth at bedtime as needed for sleep., Disp: 90 tablet, Rfl: 0  Allergies  Allergen Reactions  . Adhesive [Tape] Other (See Comments)    Blisters/rash  PLEASE USE PAPER TAPE ONLY  . Nickel     PT CAN WEAR GOLD, STERLING SILVER WITH NO PROBLEM-PT HAS NEVER HAD ANY PROBLEMS IN THE OR     ROS  Constitutional: Negative for fever or weight change.  Respiratory: Negative for cough and shortness of breath.   Cardiovascular: Negative for chest pain or palpitations.  Gastrointestinal: Negative for abdominal pain, no bowel changes.  Musculoskeletal: Negative for gait problem or joint swelling.  Skin: Negative for rash.  Neurological: Negative for dizziness or headache.  No other specific complaints in a complete review of systems (except as listed in HPI above).  Objective  Vitals:   11/02/16 1419  BP: 112/78  Pulse: 98  Resp: 16  Temp: 97.8 F (36.6 C)  TempSrc: Oral  SpO2: 97%  Weight: 190 lb (86.2 kg)    Body mass index is 35.9 kg/m.  Physical Exam  Constitutional: Patient appears well-developed and well-nourished. Obese  No distress.  HEENT: head atraumatic, normocephalic, pupils equal and reactive to light,neck supple, throat  within normal limits, hoarse Cardiovascular: Normal rate, regular rhythm and normal heart sounds.  No murmur heard. No BLE edema. Pulmonary/Chest: Effort normal and breath sounds shows end expiratory wheezing.  No respiratory distress. Abdominal: Soft.  There is no tenderness. Psychiatric: Patient has a normal mood and affect. behavior is normal. Judgment and thought content normal.  Recent Results (from the past 2160 hour(s))  CBC w/Diff/Platelet     Status: None   Collection Time: 09/30/16  2:21 PM  Result Value Ref Range   WBC 8.4 3.4 - 10.8 x10E3/uL   RBC 5.11 3.77 - 5.28 x10E6/uL   Hemoglobin 14.7 11.1 - 15.9 g/dL   Hematocrit 42.8 34.0 - 46.6 %   MCV 84 79 - 97 fL   MCH 28.8 26.6 - 33.0  pg   MCHC 34.3 31.5 - 35.7 g/dL   RDW 13.7 12.3 - 15.4 %   Platelets 310 150 - 379 x10E3/uL   Neutrophils 61 Not Estab. %   Lymphs 28 Not Estab. %   Monocytes 6 Not Estab. %   Eos 4 Not Estab. %   Basos 1 Not Estab. %   Neutrophils Absolute 5.2 1.4 - 7.0 x10E3/uL   Lymphocytes Absolute 2.3 0.7 - 3.1 x10E3/uL   Monocytes Absolute 0.5 0.1 - 0.9 x10E3/uL   EOS (ABSOLUTE) 0.3 0.0 - 0.4 x10E3/uL   Basophils Absolute 0.0 0.0 - 0.2 x10E3/uL   Immature Granulocytes 0 Not Estab. %   Immature Grans (Abs) 0.0 0.0 - 0.1 x10E3/uL  Comprehensive metabolic panel     Status: Abnormal   Collection Time: 09/30/16  2:21 PM  Result Value Ref Range   Glucose 107 (H) 65 - 99 mg/dL   BUN 11 6 - 24 mg/dL   Creatinine, Ser 0.69 0.57 - 1.00 mg/dL   GFR calc non Af Amer 102 >59 mL/min/1.73   GFR calc Af Amer 117 >59 mL/min/1.73   BUN/Creatinine Ratio 16 9 - 23   Sodium 146 (H) 134 - 144 mmol/L   Potassium 4.1 3.5 - 5.2 mmol/L   Chloride 101 96 - 106 mmol/L   CO2 28 18 - 29 mmol/L   Calcium 9.6 8.7 - 10.2 mg/dL   Total Protein 6.7 6.0 - 8.5 g/dL   Albumin 4.3 3.5 - 5.5 g/dL   Globulin, Total 2.4 1.5 - 4.5 g/dL   Albumin/Globulin Ratio 1.8 1.2 - 2.2   Bilirubin Total <0.2 0.0 - 1.2 mg/dL   Alkaline  Phosphatase 105 39 - 117 IU/L   AST 27 0 - 40 IU/L   ALT 47 (H) 0 - 32 IU/L  CEA     Status: None   Collection Time: 09/30/16  2:21 PM  Result Value Ref Range   CEA 2.0 0.0 - 4.7 ng/mL    Comment:        Roche ECLIA methodology       Nonsmokers  <3.9                                      Smokers     <5.6   Cancer antigen 27.29     Status: None   Collection Time: 09/30/16  2:21 PM  Result Value Ref Range   CA 27.29 15.5 0.0 - 38.6 U/mL    Comment: Bayer Centaur/ACS methodology  Surgical pathology     Status: None   Collection Time: 10/12/16 11:59 AM  Result Value Ref Range   SURGICAL PATHOLOGY      Surgical Pathology CASE: [id number] PATIENT: Unk Lightning Surgical Pathology Report     SPECIMEN SUBMITTED: A. Breast,, right; lumpectomy B. Sentinel node 1 C. Sentinel node 2 D. Sentinel node 3  CLINICAL HISTORY: None provided  PRE-OPERATIVE DIAGNOSIS: Cancer right breast  POST-OPERATIVE DIAGNOSIS: Same as pre-op     DIAGNOSIS: A. RIGHT BREAST; NEEDLE LOCALIZED LUMPECTOMY: - INVASIVE MAMMARY CARCINOMA OF NO SPECIAL TYPE, 16 MM. - DUCTAL CARCINOMA IN SITU WITH MICROCALCIFICATIONS. - BIOPSY SITE CHANGES, MARKER CLIP PRESENT. - POSITIVE FOR LYMPHOVASCULAR INVASION. - THE SURGICAL MARGINS ARE NEGATIVE.  B. SENTINEL LYMPH NODE; EXCISION: - POSITIVE FOR MACROMETASTASES (1/1).  C. SENTINEL LYMPH NODE #2; EXCISION: - NO TUMOR SEEN IN ONE LYMPH NODE (0/1).  D. SENTINEL  LYMPH NODE #3; EXCISION: - NO TUMOR SEEN IN ONE LYMPH NODE (0/1). - SEE SUMMARY BELOW.   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           Nuclear Grade:  2           Extensive intraductal component: negative Lymphovascular Invasion: present Treatment Effect: not applicable Margins:           Invasive carcinoma: negative                Distance from closest margin: 3 mm from medial margin            DCIS: negative                Distance from closest margin: <1 mm from medial margin  Regional Lymph nodes:      Total # lymph nodes examined: 3      # Sentinel lymph nodes examined: 3      # Lymph nodes with macrometastasis (>2.0 mm): 1      # Lymph nodes with isolated tumor cells (<0.2 mm): 0      # Lymph no des with micrometastasis (> 0.2 mm and < 2.0 mm): 0      Extranodal extension:  negative Pathologic Stage Classification (pTNM, AJCC 7th Edition)      TNM Descriptors:    N/A      pTNM: pT1c  pN1a(sn)  GROSS DESCRIPTION:  A. Intraoperative Consultation:     Received: fresh     Specimen: right breast lumpectomy     Pathologic Evaluation: immediate gross evaluation of margins     Diagnosis:  IOC: Mass; gross margins appear clear, 5 mm to closest anterior     Communicated to: Dr. Jamal Barry at 1:04 PM on 10/12/2016, Quay Burow M.D.     Tissue submitted: not applicable  A. Labeled: right breast lumpectomy  Time in fixative:   1:06 PM  Cold Ischemic Time: 21 minutes  Total formalin fixation time 9 hours  Type of specimen: excision  Location of specimen: right  Size of specimen: 6.5 (superior-inferior) by 5.5 (medial-lateral) by 2.0 (anterior-posterior) centimeter  Skin: not present  Direction of compression: not applicable  Needle localization: yes, 1 (no  accompanying radiograph)  Orientation of specimen: skin, deep, medial, lateral, cranial and caudal metallic markers Superior = blue Inferior = green Medial = yellow Lateral = orange Posterior = black Anterior/Superficial = red    Plane of sectioning:  medial-lateral  Biopsy site: present with clip material  Presence/absence of discrete mass: present  Number of discrete masses: 1  Size(s) of mass(es): 1.0 x 0.7 x 0.6 cm  Description of mass(es): ill-defined firm tan with fat necrosis and focal cavity  Distance between masses/clips: not applicable  Distance of mass(es)/ biopsy site/clip to surgical margins: anterior-0.5 cm, posterior-0.9 cm, lateral-0.7 cm, medial-0.5 cm, inferior-1.0 cm in superior-1.8 cm  Description of remainder of tissue: yellow lobulated  fibrous fatty  Other remarkable features: none noted  Tissue submitted for special investigation: not applicable  Block summary: 1-3-entire mass with perpendicular closest margins (cassette 2 c ontaining marker material) 4-perpendicular superior 5-perpendicular deep 6-perpendicular inferior  B. Labeled: Sentinel node #1  Tissue fragment(s): 1  Size: 1.8 x 1.3 x 0.9 cm  Description: lymph node candidate, sectioned and focally firm gray  Entirely submitted in 1-2 cassette(s).   C. Labeled: Sentinel node #2  Tissue fragment(s): 2  Size: aggregate, 2.5 x 1.8 x 0.8 cm  Description: yellow lobulated fibrofatty tissue 1.8 x 1.5 x 0.8 cm lymph node, trisected  Entirely submitted in 1-2 cassette(s).   D. Labeled: Sentinel node #3  Tissue fragment(s): 1  Size: 3.0 x 1.5 x 1.1 cm  Description: lymph node candidate with some attached fibrofatty tissue, sectioned  Entirely submitted in 1-3 cassette(s).    Final Diagnosis performed by Delorse Lek, MD.  Electronically signed 10/13/2016 4:58:03PM    The electronic signature indicates that the named Attending Pathologist has evaluated the specimen  Technical component performed at [facility], [address] [place], [place] [address] Lab: [phone] Dir: Darrick Penna. Evette Doffing, MD  Professional component performed at [facility], [facility], [place], [place], [place]  [address] Lab: [phone] Dir: Dellia Nims. Rubinas, MD    CBC with Differential/Platelet     Status: None   Collection Time: 10/26/16  2:56 PM  Result Value Ref Range   WBC 7.8 3.6 - 11.0 K/uL   RBC 4.91 3.80 - 5.20 MIL/uL   Hemoglobin 13.8 12.0 - 16.0 g/dL   HCT 41.3 35.0 - 47.0 %   MCV 84.2 80.0 - 100.0 fL   MCH 28.2 26.0 - 34.0 pg   MCHC 33.5 32.0 - 36.0 g/dL   RDW 13.3 11.5 - 14.5 %   Platelets 277 150 - 440 K/uL   Neutrophils Relative % 62 %   Neutro Abs 4.8 1.4 - 6.5 K/uL   Lymphocytes Relative 28 %   Lymphs Abs 2.2 1.0 - 3.6 K/uL   Monocytes Relative 5 %   Monocytes Absolute 0.4 0.2 - 0.9 K/uL   Eosinophils Relative 4 %   Eosinophils Absolute 0.3 0 - 0.7 K/uL   Basophils Relative 1 %   Basophils Absolute 0.1 0 - 0.1 K/uL  Comprehensive metabolic panel     Status: None   Collection Time: 10/26/16  2:56 PM  Result Value Ref Range   Sodium 139 135 - 145 mmol/L   Potassium 3.6 3.5 - 5.1 mmol/L   Chloride 106 101 - 111 mmol/L   CO2 28 22 - 32 mmol/L   Glucose, Bld 94 65 - 99 mg/dL   BUN 8 6 - 20 mg/dL   Creatinine, Ser 0.63 0.44 - 1.00 mg/dL   Calcium 8.9 8.9 - 10.3 mg/dL   Total Protein 7.0 6.5 - 8.1 g/dL   Albumin 4.1 3.5 - 5.0 g/dL   AST 23 15 - 41 U/L   ALT 24 14 - 54 U/L   Alkaline Phosphatase 77 38 - 126 U/L   Total Bilirubin 0.6 0.3 - 1.2 mg/dL   GFR calc non Af Amer >60 >60 mL/min   GFR calc Af Amer >60 >60 mL/min    Comment: (NOTE) The eGFR has been calculated using the CKD EPI equation. This calculation has not been validated in all clinical situations. eGFR's persistently <60 mL/min signify possible Chronic Kidney Disease.    Anion gap 5 5 - 15  Cancer antigen 15-3     Status: None   Collection Time: 10/26/16  2:56 PM  Result Value Ref Range   CA 15-3 14.9 0.0 - 25.0 U/mL    Comment: (NOTE) Roche ECLIA  methodology Performed At: [facility] [place], [place] [id number] Lindon Romp MD [id number]   Cancer antigen 27.29      Status: None   Collection Time: 10/26/16  2:56 PM  Result Value Ref Range   CA 27.29 9.2 0.0 - 38.6 U/mL    Comment: (NOTE) Bayer Centaur/ACS methodology Performed At: [facility] [address] [place], [place] [id number] Lindon Romp MD VQ:[id number]   CEA     Status: None   Collection Time: 10/26/16  2:56 PM  Result Value Ref Range   CEA 1.9 0.0 - 4.7 ng/mL    Comment: (NOTE)       Roche ECLIA methodology       Nonsmokers  <3.9                                     Smokers     <5.6 Performed At: [facility] [place], [place] [id number] Lindon Romp MD YK:[id number]      PHQ2/9: Depression screen [facility] 2/9 11/02/2016 04/28/2016 08/22/2015  Decreased Interest 0 0 0  Down, Depressed, Hopeless 1 0 0  PHQ - 2 Score 1 0 0     Fall Risk: Fall Risk  11/02/2016 04/28/2016 08/22/2015  Falls in the past year? No No No     Functional Status Survey: Is the patient deaf or have difficulty hearing?: No Does the patient have difficulty seeing, even when wearing glasses/contacts?: Yes Does the patient have difficulty concentrating, remembering, or making decisions?: No Does the patient have difficulty walking or climbing stairs?: No Does the patient have difficulty dressing or bathing?: No Does the patient have difficulty doing errands alone such as visiting a doctor's office or shopping?: No    Assessment & Plan  1. Cough in adult  - azithromycin (ZITHROMAX Z-PAK) 250 MG tablet; Take as directed  Dispense: 6 each; Refill: 0 - fluticasone furoate-vilanterol (BREO ELLIPTA) 100-25 MCG/INH AEPB; Inhale 1 puff into the lungs daily.  Dispense: 60 each; Refill: 0  2. 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 Needs flu shot  refused  4. Need for pneumococcal  vaccination  refuses

## 2016-11-02 NOTE — Patient Instructions (Signed)
Your procedure is scheduled on: Thursday 11/04/16 Report to [place]. 2ND FLOOR MEDICAL MALL ENTRANCE. To find out your arrival time please call [phone] between 1PM - 3PM on Wednesday 11/03/16.  Remember: Instructions that are not followed completely may result in serious medical risk, up to and including death, or upon the discretion of your surgeon and anesthesiologist your surgery may need to be rescheduled.    __X__ 1. Do not eat food or drink liquids after midnight. No gum chewing or hard candies.     __X__ 2. No Alcohol for 24 hours before or after surgery.   ____ 3. Bring all medications with you on the day of surgery if instructed.    __X__ 4. Notify your doctor if there is any change in your medical condition     (cold, fever, infections).             ___x__5. No smoking within 24 hours of your surgery.     Do not wear jewelry, make-up, hairpins, clips or nail polish.  Do not wear lotions, powders, or perfumes.   Do not shave 48 hours prior to surgery. Men may shave face and neck.  Do not bring valuables to the hospital.    [facility] is not responsible for any belongings or valuables.               Contacts, dentures or bridgework may not be worn into surgery.  Leave your suitcase in the car. After surgery it may be brought to your room.  For patients admitted to the hospital, discharge time is determined by your                treatment team.   Patients discharged the day of surgery will not be allowed to drive home.   Please read over the following fact sheets that you were given:   MRSA Information   ____ Take these medicines the morning of surgery with A SIP OF WATER:    1. NONE  2.   3.   4.  5.  6.  ____ Fleet Enema (as directed)   ____ Use CHG Soap as directed  ____ Use inhalers on the day of surgery  ____ Stop metformin 2 days prior to surgery    ____ Take 1/2 of usual insulin dose the night before surgery and none on the morning of  surgery.   ____ Stop Coumadin/Plavix/aspirin on   __X__ Stop Anti-inflammatories such as Advil, Aleve, Ibuprofen, Motrin, Naproxen, Naprosyn, Goodies,powder, or aspirin products.  OK to take Tylenol.   ____ Stop supplements until after surgery.    ____ Bring C-Pap to the hospital.

## 2016-11-02 NOTE — Telephone Encounter (Signed)
msg sent to sch. Team to arrange pet scan

## 2016-11-03 ENCOUNTER — Inpatient Hospital Stay: Payer: Managed Care, Other (non HMO)

## 2016-11-03 ENCOUNTER — Other Ambulatory Visit: Payer: Self-pay

## 2016-11-03 MED ORDER — FLUTICASONE FUROATE-VILANTEROL 100-25 MCG/INH IN AEPB: 1.0000 | INHALATION_SPRAY | Freq: Every day | RESPIRATORY_TRACT | 0 refills | Status: DC

## 2016-11-03 MED ORDER — AZITHROMYCIN 250 MG PO TABS
ORAL_TABLET | ORAL | 0 refills | Status: DC
Start: 2016-11-03 — End: 2016-11-16

## 2016-11-03 NOTE — Addendum Note (Signed)
Addended by: Vonna Kotyk L on: 11/03/2016 10:09 AM   Modules accepted: Orders

## 2016-11-04 ENCOUNTER — Encounter: Payer: Self-pay | Admitting: General Surgery

## 2016-11-04 ENCOUNTER — Telehealth: Payer: Self-pay | Admitting: *Deleted

## 2016-11-04 ENCOUNTER — Other Ambulatory Visit: Payer: Self-pay

## 2016-11-04 ENCOUNTER — Ambulatory Visit: Payer: Managed Care, Other (non HMO)

## 2016-11-04 ENCOUNTER — Ambulatory Visit: Payer: Managed Care, Other (non HMO) | Admitting: Anesthesiology

## 2016-11-04 ENCOUNTER — Encounter
Admission: RE | Disposition: A | Discharge status: Home / Self Care | Payer: Self-pay | Source: Ambulatory Visit | Attending: General Surgery

## 2016-11-04 ENCOUNTER — Ambulatory Visit
Admission: RE | Admit: 2016-11-04 | Discharge: 2016-11-04 | Disposition: A | Discharge status: Home / Self Care | Payer: Managed Care, Other (non HMO) | Source: Ambulatory Visit | Attending: General Surgery | Admitting: General Surgery

## 2016-11-04 DIAGNOSIS — Z87891 Personal history of nicotine dependence: Secondary | ICD-10-CM | POA: Insufficient documentation

## 2016-11-04 DIAGNOSIS — Z95828 Presence of other vascular implants and grafts: Secondary | ICD-10-CM

## 2016-11-04 DIAGNOSIS — Z170 Estrogen receptor positive status [ER+]: Secondary | ICD-10-CM

## 2016-11-04 DIAGNOSIS — Z79899 Other long term (current) drug therapy: Secondary | ICD-10-CM | POA: Insufficient documentation

## 2016-11-04 DIAGNOSIS — Z803 Family history of malignant neoplasm of breast: Secondary | ICD-10-CM | POA: Diagnosis not present

## 2016-11-04 DIAGNOSIS — C50011 Malignant neoplasm of nipple and areola, right female breast: Secondary | ICD-10-CM

## 2016-11-04 DIAGNOSIS — G4709 Other insomnia: Secondary | ICD-10-CM

## 2016-11-04 DIAGNOSIS — C50911 Malignant neoplasm of unspecified site of right female breast: Secondary | ICD-10-CM | POA: Diagnosis present

## 2016-11-04 DIAGNOSIS — G4700 Insomnia, unspecified: Secondary | ICD-10-CM | POA: Insufficient documentation

## 2016-11-04 DIAGNOSIS — C50511 Malignant neoplasm of lower-outer quadrant of right female breast: Secondary | ICD-10-CM | POA: Diagnosis not present

## 2016-11-04 DIAGNOSIS — Z7951 Long term (current) use of inhaled steroids: Secondary | ICD-10-CM | POA: Diagnosis not present

## 2016-11-04 HISTORY — PX: PORTACATH PLACEMENT: SHX2246

## 2016-11-04 SURGERY — INSERTION, TUNNELED CENTRAL VENOUS DEVICE, WITH PORT
Anesthesia: General | Site: Chest | Laterality: Left | Wound class: Clean

## 2016-11-04 MED ORDER — LIDOCAINE HCL 1 % IJ SOLN
INTRAMUSCULAR | Status: DC | PRN
  Administered 2016-11-04: 10 mL via SUBCUTANEOUS

## 2016-11-04 MED ORDER — OXYCODONE HCL 5 MG PO TABS: 5.0000 mg | ORAL_TABLET | Freq: Once | ORAL | Status: DC | PRN

## 2016-11-04 MED ORDER — FAMOTIDINE 20 MG PO TABS
20.0000 mg | ORAL_TABLET | Freq: Once | ORAL | Status: AC
  Administered 2016-11-04: 20 mg via ORAL

## 2016-11-04 MED ORDER — ALBUTEROL SULFATE (2.5 MG/3ML) 0.083% IN NEBU
2.5000 mg | INHALATION_SOLUTION | Freq: Once | RESPIRATORY_TRACT | Status: DC
  Filled 2016-11-04: qty 3

## 2016-11-04 MED ORDER — FAMOTIDINE 20 MG PO TABS
ORAL_TABLET | ORAL | Status: AC
  Administered 2016-11-04: 20 mg via ORAL
  Filled 2016-11-04: qty 1

## 2016-11-04 MED ORDER — ACETAMINOPHEN 10 MG/ML IV SOLN
INTRAVENOUS | Status: DC | PRN
  Administered 2016-11-04: 1000 mg via INTRAVENOUS

## 2016-11-04 MED ORDER — IPRATROPIUM-ALBUTEROL 0.5-2.5 (3) MG/3ML IN SOLN
RESPIRATORY_TRACT | Status: AC
  Administered 2016-11-04: 3 mL via RESPIRATORY_TRACT
  Filled 2016-11-04: qty 3

## 2016-11-04 MED ORDER — CHLORHEXIDINE GLUCONATE CLOTH 2 % EX PADS
6.0000 | MEDICATED_PAD | Freq: Once | CUTANEOUS | Status: AC
  Administered 2016-11-04: 6 via TOPICAL

## 2016-11-04 MED ORDER — IPRATROPIUM-ALBUTEROL 0.5-2.5 (3) MG/3ML IN SOLN
RESPIRATORY_TRACT | Status: AC
  Administered 2016-11-04: 3 mL
  Filled 2016-11-04: qty 3

## 2016-11-04 MED ORDER — CEFAZOLIN SODIUM-DEXTROSE 2-4 GM/100ML-% IV SOLN
INTRAVENOUS | Status: AC
  Administered 2016-11-04: 2 g via INTRAVENOUS
  Filled 2016-11-04: qty 100

## 2016-11-04 MED ORDER — ACETAMINOPHEN 10 MG/ML IV SOLN
INTRAVENOUS | Status: AC
  Filled 2016-11-04: qty 100

## 2016-11-04 MED ORDER — MEPERIDINE HCL 25 MG/ML IJ SOLN: 6.2500 mg | INTRAMUSCULAR | Status: DC | PRN

## 2016-11-04 MED ORDER — MIDAZOLAM HCL 2 MG/2ML IJ SOLN
INTRAMUSCULAR | Status: DC | PRN
  Administered 2016-11-04: 2 mg via INTRAVENOUS

## 2016-11-04 MED ORDER — FENTANYL CITRATE (PF) 100 MCG/2ML IJ SOLN: 25.0000 ug | INTRAMUSCULAR | Status: DC | PRN

## 2016-11-04 MED ORDER — TRAMADOL HCL 50 MG PO TABS: 50.0000 mg | ORAL_TABLET | Freq: Four times a day (QID) | ORAL | 0 refills | Status: DC | PRN

## 2016-11-04 MED ORDER — BUPIVACAINE HCL (PF) 0.5 % IJ SOLN
INTRAMUSCULAR | Status: AC
  Filled 2016-11-04: qty 30

## 2016-11-04 MED ORDER — IPRATROPIUM-ALBUTEROL 0.5-2.5 (3) MG/3ML IN SOLN
3.0000 mL | Freq: Once | RESPIRATORY_TRACT | Status: AC
  Administered 2016-11-04: 3 mL via RESPIRATORY_TRACT

## 2016-11-04 MED ORDER — HEPARIN SODIUM (PORCINE) 5000 UNIT/ML IJ SOLN
INTRAMUSCULAR | Status: AC
  Filled 2016-11-04: qty 1

## 2016-11-04 MED ORDER — OXYCODONE HCL 5 MG/5ML PO SOLN: 5.0000 mg | Freq: Once | ORAL | Status: DC | PRN

## 2016-11-04 MED ORDER — LACTATED RINGERS IV SOLN
INTRAVENOUS | Status: DC
  Administered 2016-11-04: 09:00:00 via INTRAVENOUS

## 2016-11-04 MED ORDER — PROPOFOL 10 MG/ML IV BOLUS
INTRAVENOUS | Status: DC | PRN
  Administered 2016-11-04: 200 mg via INTRAVENOUS
  Administered 2016-11-04: 50 mg via INTRAVENOUS

## 2016-11-04 MED ORDER — DEXAMETHASONE SODIUM PHOSPHATE 10 MG/ML IJ SOLN
INTRAMUSCULAR | Status: DC | PRN
  Administered 2016-11-04: 4 mg via INTRAVENOUS

## 2016-11-04 MED ORDER — ONDANSETRON HCL 4 MG/2ML IJ SOLN
INTRAMUSCULAR | Status: DC | PRN
  Administered 2016-11-04: 4 mg via INTRAVENOUS

## 2016-11-04 MED ORDER — PHENYLEPHRINE HCL 10 MG/ML IJ SOLN
INTRAMUSCULAR | Status: DC | PRN
  Administered 2016-11-04 (×3): 100 ug via INTRAVENOUS
  Administered 2016-11-04: 50 ug via INTRAVENOUS
  Administered 2016-11-04 (×2): 100 ug via INTRAVENOUS

## 2016-11-04 MED ORDER — SUCCINYLCHOLINE CHLORIDE 20 MG/ML IJ SOLN
INTRAMUSCULAR | Status: DC | PRN
  Administered 2016-11-04: 100 mg via INTRAVENOUS

## 2016-11-04 MED ORDER — LIDOCAINE HCL (PF) 1 % IJ SOLN
INTRAMUSCULAR | Status: AC
  Filled 2016-11-04: qty 2

## 2016-11-04 MED ORDER — PROPOFOL 500 MG/50ML IV EMUL
INTRAVENOUS | Status: DC | PRN
  Administered 2016-11-04: 100 ug/kg/min via INTRAVENOUS

## 2016-11-04 MED ORDER — PROMETHAZINE HCL 25 MG/ML IJ SOLN: 6.2500 mg | INTRAMUSCULAR | Status: DC | PRN

## 2016-11-04 MED ORDER — FENTANYL CITRATE (PF) 100 MCG/2ML IJ SOLN
INTRAMUSCULAR | Status: DC | PRN
  Administered 2016-11-04 (×4): 50 ug via INTRAVENOUS

## 2016-11-04 MED ORDER — CEFAZOLIN SODIUM-DEXTROSE 2-4 GM/100ML-% IV SOLN
2.0000 g | INTRAVENOUS | Status: AC
  Administered 2016-11-04: 2 g via INTRAVENOUS

## 2016-11-04 MED ORDER — LIDOCAINE HCL (PF) 1 % IJ SOLN
INTRAMUSCULAR | Status: AC
  Filled 2016-11-04: qty 30

## 2016-11-04 SURGICAL SUPPLY — 29 items
BAG DECANTER FOR FLEXI CONT (MISCELLANEOUS) ×3 IMPLANT
BLADE SURG 15 STRL SS SAFETY (BLADE) ×3 IMPLANT
CANISTER SUCT 1200ML W/VALVE (MISCELLANEOUS) ×3 IMPLANT
CHLORAPREP W/TINT 26ML (MISCELLANEOUS) ×3 IMPLANT
COVER LIGHT HANDLE STERIS (MISCELLANEOUS) ×6 IMPLANT
DECANTER SPIKE VIAL GLASS SM (MISCELLANEOUS) ×3 IMPLANT
DERMABOND ADVANCED (GAUZE/BANDAGES/DRESSINGS) ×2
DERMABOND ADVANCED .7 DNX12 (GAUZE/BANDAGES/DRESSINGS) ×1 IMPLANT
DRAPE C-ARM XRAY 36X54 (DRAPES) ×3 IMPLANT
ELECT REM PT RETURN 9FT ADLT (ELECTROSURGICAL) ×3
ELECTRODE REM PT RTRN 9FT ADLT (ELECTROSURGICAL) ×1 IMPLANT
GLOVE BIO SURGEON STRL SZ7 (GLOVE) ×3 IMPLANT
GOWN STRL REUS W/ TWL LRG LVL3 (GOWN DISPOSABLE) ×2 IMPLANT
GOWN STRL REUS W/TWL LRG LVL3 (GOWN DISPOSABLE) ×4
IV NS 500ML (IV SOLUTION) ×2
IV NS 500ML BAXH (IV SOLUTION) ×1 IMPLANT
KIT PORT POWER 8FR ISP CVUE (Catheter) ×3 IMPLANT
KIT RM TURNOVER STRD PROC AR (KITS) ×3 IMPLANT
LABEL OR SOLS (LABEL) ×3 IMPLANT
NEEDLE FILTER BLUNT 18X 1/2SAF (NEEDLE) ×2
NEEDLE FILTER BLUNT 18X1 1/2 (NEEDLE) ×1 IMPLANT
NEEDLE HYPO 25GX1X1/2 BEV (NEEDLE) ×3 IMPLANT
NS IRRIG 500ML POUR BTL (IV SOLUTION) ×3 IMPLANT
PACK PORT-A-CATH (MISCELLANEOUS) ×3 IMPLANT
SUT PROLENE 2 0 SH DA (SUTURE) ×3 IMPLANT
SUT VIC AB 3-0 SH 27 (SUTURE) ×2
SUT VIC AB 3-0 SH 27X BRD (SUTURE) ×1 IMPLANT
SUT VIC AB 4-0 FS2 27 (SUTURE) ×3 IMPLANT
SYR 3ML LL SCALE MARK (SYRINGE) ×3 IMPLANT

## 2016-11-04 NOTE — Telephone Encounter (Signed)
Patient requesting refill of Ambien to [facility].

## 2016-11-04 NOTE — Anesthesia Procedure Notes (Signed)
Procedure Name: Intubation Performed by: Zania Kalisz Pre-anesthesia Checklist: Patient identified, Patient being monitored, Timeout performed, Emergency Drugs available and Suction available Patient Re-evaluated:Patient Re-evaluated prior to inductionOxygen Delivery Method: Circle system utilized Preoxygenation: Pre-oxygenation with 100% oxygen Intubation Type: IV induction Ventilation: Mask ventilation without difficulty Laryngoscope Size: Mac and 3 Grade View: Grade I Tube type: Oral Tube size: 7.0 mm Number of attempts: 1 Airway Equipment and Method: Stylet Placement Confirmation: ETT inserted through vocal cords under direct vision,  positive ETCO2 and breath sounds checked- equal and bilateral Secured at: 22 cm Tube secured with: Tape Dental Injury: Teeth and Oropharynx as per pre-operative assessment        

## 2016-11-04 NOTE — Telephone Encounter (Signed)
Patient called requesting a return to work note for tomorrow 11/05/16. She had port placed today 11/04/16. Is this acceptable to send?  She needs it faxed to [facility] [phone]

## 2016-11-04 NOTE — Op Note (Signed)
Preop diagnosis: Carcinoma right breast  Post op diagnosis: Same  Operation: Insertion venous access port with ultrasound and fluoroscopic guidance  Surgeon: Mckinley Jewel  Assistant:     Anesthesia: Gen.  Complications: None  EBL: Less than 10 mL  Drains: None  Description: Patient was placed supine on the operating table in the initial attempt was made to do the procedure under sedation and local. However because the patient tending to cough and movement of locked it was decided to proceed with the placement of an endotracheal tube. The left upper chest and neck area were prepped and draped sterile field and timeout performed. Ultrasound probe was brought up to the field and the subclavian vein identified beneath the lateral end of the clavicle. A small stab incision was made and the needle was successfully positioned of a subclavian vein verified that ultrasound with free return of the blood. Using the Seldinger technique the catheter was then positioned going through this area into the superior vena cava with a skin mark brought proximally 20-23 cm. The subcutaneous port was planned over the second interspace which area was infiltrated with local anesthetic of half percent Marcaine mixed with 1% Xylocaine. Skin incision was made and subcutaneous pocket was created. Catheter was tunneled through to the site and cut to approximate length and fixed to the prefilled port. The port was placed in the pocket and anchored to the underlying fascia with 3 stitches of 2-0 Prolene. Fluoroscopy was repeated to show the catheter was in proper position and showed no kink or twist long course. The subcutaneous tissue closed 3-0 Vicryl and the skin with subcuticular 4-0 Vicryl covered with Dermabond. Procedure was well-tolerated patient subsequently returned recovery room stable condition.

## 2016-11-04 NOTE — Discharge Instructions (Addendum)

## 2016-11-04 NOTE — Transfer of Care (Signed)
Immediate Anesthesia Transfer of Care Note  [name]  Procedure(s) Performed: Procedure(s) with comments: INSERTION PORT-A-CATH (Left) - Left subclavian vein  Patient Location: PACU  Anesthesia Type:General  Level of Consciousness: awake, alert  and responds to stimulation  Airway & Oxygen Therapy: Patient Spontanous Breathing and Patient connected to face mask oxygen  Post-op Assessment: Report given to RN and Post -op Vital signs reviewed and stable  Post vital signs: Reviewed and stable  Last Vitals:  Vitals:   11/04/16 1139 11/04/16 1141  BP: 131/77 131/77  Pulse: 89 91  Resp: 14 11  Temp: 36.2 C     Last Pain:  Vitals:   11/04/16 0821  TempSrc: Tympanic         Complications: No apparent anesthesia complications

## 2016-11-04 NOTE — OR Nursing (Signed)
Dr. Randa Lynn aware of productive cough and pt. On day 2 of z-pack. Nebulizer ordered

## 2016-11-04 NOTE — Anesthesia Preprocedure Evaluation (Signed)
Anesthesia Evaluation  Patient identified by MRN, date of birth, ID band Patient awake    Reviewed: Allergy & Precautions, NPO status , Patient's Chart, lab work & pertinent test results  History of Anesthesia Complications Negative for: history of anesthetic complications  Airway Mallampati: II  TM Distance: >3 FB Neck ROM: Full    Dental  (+) Upper Dentures, Lower Dentures   Pulmonary neg sleep apnea, neg COPD, former smoker,    breath sounds clear to auscultation- rhonchi (-) wheezing      Cardiovascular Exercise Tolerance: Good (-) hypertension(-) CAD and (-) Past MI  Rhythm:Regular Rate:Normal - Systolic murmurs and - Diastolic murmurs    Neuro/Psych Anxiety Depression negative neurological ROS     GI/Hepatic Neg liver ROS, GERD  ,  Endo/Other  negative endocrine ROSneg diabetes  Renal/GU negative Renal ROS     Musculoskeletal negative musculoskeletal ROS (+)   Abdominal (+) + obese,   Peds  Hematology  (+) anemia ,   Anesthesia Other Findings Past Medical History: No date: Anemia No date: Anxiety No date: Bursitis of right shoulder No date: Cancer [facility])     Comment: breast No date: Cataract, bilateral No date: Cold     Comment: states she has had it for 2 months No date: Depression No date: GERD (gastroesophageal reflux disease)     Comment: uses baking soda for symptoms No date: Insomnia No date: Lumbago No date: Vitamin D deficiency   Reproductive/Obstetrics                             Anesthesia Physical Anesthesia Plan  ASA: III  Anesthesia Plan: General   Post-op Pain Management:    Induction: Intravenous  Airway Management Planned: Natural Airway  Additional Equipment:   Intra-op Plan:   Post-operative Plan:   Informed Consent: I have reviewed the patients History and Physical, chart, labs and discussed the procedure including the risks, benefits and  alternatives for the proposed anesthesia with the patient or authorized representative who has indicated his/her understanding and acceptance.   Dental advisory given  Plan Discussed with: CRNA and Anesthesiologist  Anesthesia Plan Comments:         Anesthesia Quick Evaluation

## 2016-11-04 NOTE — H&P (Signed)
Shelby Barry is an 50 y.o. female.   Chief Complaint:here for [facility] placement. HPI: 50 yr old female post op right breast lumpectomy/SN biopsy. She has a T1,N1 stage2 cancer and chemo is planned. Port requested for same  Past Medical History:  Diagnosis Date  . Anemia   . Anxiety   . Bursitis of right shoulder   . Cancer ([place])    breast  . Cataract, bilateral   . Cold    states she has had it for 2 months  . Depression   . GERD (gastroesophageal reflux disease)    uses baking soda for symptoms  . Insomnia   . Lumbago   . Vitamin D deficiency     Past Surgical History:  Procedure Laterality Date  . ABDOMINAL HYSTERECTOMY  04/20/2014  . BILATERAL SALPINGECTOMY    . BREAST LUMPECTOMY WITH SENTINEL LYMPH NODE BIOPSY Right 10/12/2016   Procedure: BREAST LUMPECTOMY WITH SENTINEL LYMPH NODE BX;  Surgeon: Christene Lye, MD;  Location: [facility] ORS;  Service: General;  Laterality: Right;  . COLONOSCOPY WITH PROPOFOL N/A 05/20/2016   Procedure: COLONOSCOPY WITH PROPOFOL;  Surgeon: Lucilla Lame, MD;  Location: [place];  Service: Endoscopy;  Laterality: N/A;  . ECTOPIC PREGNANCY SURGERY    . ENDOMETRIAL ABLATION    . TUBAL LIGATION      Family History  Problem Relation Age of Onset  . Depression Father   . Obesity Father   . Alcohol abuse Brother   . Seizures Brother   . Breast cancer Sister 42   Social History:  reports that she quit smoking about 2 months ago. Her smoking use included Cigarettes. She has a 10.00 pack-year smoking history. She has never used smokeless tobacco. She reports that she drinks alcohol. She reports that she does not use drugs.  Allergies:  Allergies  Allergen Reactions  . Adhesive [Tape] Other (See Comments)    Blisters/rash  PLEASE USE PAPER TAPE ONLY  . Nickel     PT CAN WEAR GOLD, STERLING SILVER WITH NO PROBLEM-PT HAS NEVER HAD ANY PROBLEMS IN THE OR    Medications Prior to Admission  Medication Sig Dispense Refill  .  azithromycin (ZITHROMAX Z-PAK) 250 MG tablet Take as directed 6 each 0  . fluticasone furoate-vilanterol (BREO ELLIPTA) 100-25 MCG/INH AEPB Inhale 1 puff into the lungs daily. 60 each 0  . Multiple Vitamin (MULTIVITAMIN WITH MINERALS) TABS tablet Take 1 tablet by mouth daily. One-A-Day    . zolpidem (AMBIEN) 10 MG tablet Take 0.5-1 tablets (5-10 mg total) by mouth at bedtime as needed for sleep. 90 tablet 0  . lidocaine-prilocaine (EMLA) cream Apply 1 application topically as needed. Apply generously over the Mediport 45 minutes prior to chemotherapy. (Patient not taking: Reported on 10/28/2016) 30 g 0  . meloxicam (MOBIC) 15 MG tablet Take 1 tablet (15 mg total) by mouth daily. (Patient not taking: Reported on 10/26/2016) 30 tablet 2  . ondansetron (ZOFRAN) 8 MG tablet Take 1 tablet (8 mg total) by mouth every 8 (eight) hours as needed for nausea or vomiting (start 3 days; after chemo). (Patient not taking: Reported on 10/28/2016) 40 tablet 1  . prochlorperazine (COMPAZINE) 10 MG tablet Take 1 tablet (10 mg total) by mouth every 6 (six) hours as needed for nausea or vomiting. (Patient not taking: Reported on 10/28/2016) 40 tablet 1    No results found for this or any previous visit (from the past 48 hour(s)). Ct Chest W Contrast  Result Date:  11/02/2016 CLINICAL DATA:  New diagnosis of right breast cancer 3 months ago. EXAM: CT CHEST WITH CONTRAST TECHNIQUE: Multidetector CT imaging of the chest was performed during intravenous contrast administration. CONTRAST:  75mL ISOVUE-300 IOPAMIDOL (ISOVUE-300) INJECTION 61% COMPARISON:  None. FINDINGS: Cardiovascular: The heart size appears normal. No pericardial effusion. Aortic atherosclerosis. Mediastinum/Nodes: The trachea appears patent and is midline. Normal appearance of the esophagus. No mediastinal or hilar adenopathy identified. Right breast lesion is identified measuring 2.3 x 1.9 cm, image number 28 of series 2. No enlarged axillary or supraclavicular  lymph nodes, bilaterally. Lungs/Pleura: No pleural fluid. Scattered pulmonary nodules scratch set scattered small nodules are noted within the left lung and appear most numerous within the left lower lobe posteriorly. Many of these nodules have a tree-in-bud configuration which would be typical of that inflammatory or infectious bronchiolitis. There are no suspicious nodules identified within the right lung. Upper Abdomen: Within the posteromedial right lobe of liver there is a 1.5 cm focus of intermediate attenuation (50 HU), image number 62 of series 2. A second, indeterminate intermediate attenuation structure within right lobe of liver measures 2.2 cm, image 58 of series 2. The gallbladder is normal. No acute abnormality noted within the upper abdomen. Musculoskeletal: No aggressive lytic or sclerotic bone lesions. IMPRESSION: 1. Right breast lesion is identified which is compatible with recently diagnosed breast carcinoma. 2. There are two indeterminate, intermediate attenuating lesions within right lobe of liver. In this patient who is at increased risk recommend further investigation with either PET-CT or contrast enhanced MRI of the liver. 3. Multiple tiny nodules are identified within the left lung and appear most numerous in the left base. Many of these nodules have a tree-in-bud configuration and are favored to represent sequelae of inflammatory or infectious bronchiolitis. Electronically Signed   By: Kerby Moors M.D.   On: 11/02/2016 13:31    Review of Systems  Constitutional: Negative.   Respiratory: Negative.   Cardiovascular: Negative.   Gastrointestinal: Negative.   Genitourinary: Negative.     Blood pressure 140/77, pulse 88, temperature (!) 96.6 F (35.9 C), temperature source Tympanic, resp. rate 16, height 5\' 1"  (1.549 m), weight 190 lb (86.2 kg), SpO2 97 %. Physical Exam  Constitutional: She is oriented to person, place, and time. She appears well-developed and well-nourished.   Eyes: Conjunctivae are normal. No scleral icterus.  Cardiovascular: Normal rate, regular rhythm and normal heart sounds.   Respiratory: Effort normal and breath sounds normal.  Neurological: She is alert and oriented to person, place, and time.  Skin: Skin is warm and dry.     Assessment/Plan CA right breast.. Proceed with port placement  Christene Lye, MD 11/04/2016, 9:57 AM

## 2016-11-05 ENCOUNTER — Telehealth: Payer: Self-pay | Admitting: *Deleted

## 2016-11-05 ENCOUNTER — Other Ambulatory Visit: Payer: Self-pay | Admitting: *Deleted

## 2016-11-05 ENCOUNTER — Encounter: Admission: RE | Admit: 2016-11-05 | Payer: Managed Care, Other (non HMO) | Source: Ambulatory Visit

## 2016-11-05 DIAGNOSIS — Z170 Estrogen receptor positive status [ER+]: Principal | ICD-10-CM

## 2016-11-05 DIAGNOSIS — Z79899 Other long term (current) drug therapy: Secondary | ICD-10-CM

## 2016-11-05 DIAGNOSIS — Z5181 Encounter for therapeutic drug level monitoring: Secondary | ICD-10-CM

## 2016-11-05 DIAGNOSIS — C50511 Malignant neoplasm of lower-outer quadrant of right female breast: Secondary | ICD-10-CM

## 2016-11-05 NOTE — Telephone Encounter (Signed)
T/C made to patient to inform that I have cancelled her Cardiac MRI that was scheduled for Monday at 2:00 in [place]. Informed that since she did not get her PET scan, we still don't know what is going on with the liver lesions and that we hope the PET scan can give [place] more information. Ms. Sepich states she is having trouble keeping up with all of her appointments. Reviewed with her that she is scheduled for a MUGA scan next Tuesday at 11:30; her chemotherapy is still scheduled for Wednesday morning at 8:30 and her PET scan has been rescheduled for Thursday at 7:30 in the morning. Patient has questions about what  PET scan is and reviewed with her the purpose of the scan, the basic scan procedure and what we hope to find out from the scan. Ms. Asfour states she is glad they are doing this and adds "I feel like I'm eat up with something." She still has a chronic cough and is unable to carry on  The conversation without making the cough worse. T/C made earlier to [facility] [place] to cancel the Cardiac MRI that was scheduled on 11/08/16 as a part of the Prevent study. Will inform Dr. Rogue Bussing of this as well. Yolande Jolly, BSN, MHA, OCN 11/05/2016 9:36 AM

## 2016-11-05 NOTE — Telephone Encounter (Signed)
Spoke with patient regarding her tx plan and dx workup. Insurance denied muga scan.  Echo needs to be ordered. Will try to coordinate echo on Tuesday if possible.  "11/09/16 MUGA Denied:  Based on [employer] Cardiac Imaging Guidelines, we are unable to approve the requested procedure. MUGA scan (Multi Gated Acquisition Scan) is not supported unless an echocardiogram is technically limited and prevents accurate assessment of left ventricular function. There is no evidence-based data to support that MUGA is a better imaging study than an echocardiogram to determine ejection fraction in individuals preparing for, undergoing, or with a history of treatment with chemotherapy drugs that may cause damage to the heart. The clinical information provided does not meet these criteria and, therefore, the requested imaging is not indicated at this time."    Explained to patient that md would like to see her on Wednesday 12/20 as planned; however, md will like to post-pone the [facility] chemo until patient's upper respiratory infection symptoms improve. Pet scan apt on Thursday 12/21. Pt instructed to keep this apt.  Teach back process performed.  On clinical note, pt still reports cough and dyspnea.

## 2016-11-05 NOTE — Anesthesia Postprocedure Evaluation (Signed)
Anesthesia Post Note  [name]  Procedure(s) Performed: Procedure(s) (LRB): INSERTION PORT-A-CATH (Left)  Patient location during evaluation: PACU Anesthesia Type: General Level of consciousness: awake and alert Pain management: pain level controlled Vital Signs Assessment: post-procedure vital signs reviewed and stable Respiratory status: spontaneous breathing, nonlabored ventilation, respiratory function stable and patient connected to nasal cannula oxygen Cardiovascular status: blood pressure returned to baseline and stable Postop Assessment: no signs of nausea or vomiting Anesthetic complications: no    Last Vitals:  Vitals:   11/04/16 1242 11/04/16 1314  BP: 134/63 138/64  Pulse: 75 76  Resp: 18 16  Temp: 36.5 C     Last Pain:  Vitals:   11/05/16 0933  TempSrc:   PainSc: 0-No pain                 Precious Haws Zyanna Leisinger

## 2016-11-06 ENCOUNTER — Other Ambulatory Visit: Payer: Self-pay | Admitting: Internal Medicine

## 2016-11-06 DIAGNOSIS — C50511 Malignant neoplasm of lower-outer quadrant of right female breast: Secondary | ICD-10-CM

## 2016-11-06 DIAGNOSIS — Z170 Estrogen receptor positive status [ER+]: Principal | ICD-10-CM

## 2016-11-08 ENCOUNTER — Ambulatory Visit (HOSPITAL_COMMUNITY): Admission: RE | Admit: 2016-11-08 | Payer: Managed Care, Other (non HMO) | Source: Ambulatory Visit

## 2016-11-08 ENCOUNTER — Telehealth: Payer: Self-pay | Admitting: *Deleted

## 2016-11-08 NOTE — Telephone Encounter (Signed)
Received message from Triage RN Shelby Barry that Shelby Barry was trying to call me. T/C made to patient to follow up. Shelby Barry had questions about the Echocardiogram she is scheduled to have tomorrow, what it is for and whether it is really necessary. States her insurance would not approve a MUGA scan, so they are having her get an Echo instead. Instructed patient that the Echo & MUGA will measure how well her heart is able to pump blood, and they have to make sure her heart is strong enough to tolerate the Adriamycin. Also informed her that this is a test we do for all patients before they receive an anthracycline. Patient states she wanted to get all of these tests performed before she got her port-a-cath placed, and states her port site is still very tender. Inst that it will be tender for a couple of weeks, and reminded to use the Emla cream before she comes in for her first chemotherapy treatment next week. Also reminded her that her cardiac MRI, which was scheduled for this afternoon, has been cancelled - and she states she did remember this. Questioned patient whether she has my phone number, and she verifies that she still has my card. Instructed her to call me if she has any further questions and she agrees. Shelby Barry, BSN, MHA, OCN 11/08/2016 10:04 AM

## 2016-11-09 ENCOUNTER — Ambulatory Visit
Admission: RE | Admit: 2016-11-09 | Discharge: 2016-11-09 | Disposition: A | Discharge status: Home / Self Care | Payer: Managed Care, Other (non HMO) | Source: Ambulatory Visit | Attending: Internal Medicine | Admitting: Internal Medicine

## 2016-11-09 ENCOUNTER — Other Ambulatory Visit: Payer: Self-pay | Admitting: Internal Medicine

## 2016-11-09 DIAGNOSIS — Z79899 Other long term (current) drug therapy: Secondary | ICD-10-CM | POA: Insufficient documentation

## 2016-11-09 DIAGNOSIS — Z170 Estrogen receptor positive status [ER+]: Secondary | ICD-10-CM | POA: Diagnosis not present

## 2016-11-09 DIAGNOSIS — Z5181 Encounter for therapeutic drug level monitoring: Secondary | ICD-10-CM | POA: Diagnosis not present

## 2016-11-09 DIAGNOSIS — I5030 Unspecified diastolic (congestive) heart failure: Secondary | ICD-10-CM | POA: Diagnosis not present

## 2016-11-09 DIAGNOSIS — C50511 Malignant neoplasm of lower-outer quadrant of right female breast: Secondary | ICD-10-CM | POA: Insufficient documentation

## 2016-11-09 DIAGNOSIS — K769 Liver disease, unspecified: Secondary | ICD-10-CM

## 2016-11-09 NOTE — Progress Notes (Signed)
*  PRELIMINARY RESULTS* Echocardiogram 2D Echocardiogram has been performed.  Sherrie Sport 11/09/2016, 11:44 AM

## 2016-11-10 ENCOUNTER — Inpatient Hospital Stay: Payer: Managed Care, Other (non HMO)

## 2016-11-10 ENCOUNTER — Telehealth: Payer: Self-pay | Admitting: Internal Medicine

## 2016-11-10 ENCOUNTER — Inpatient Hospital Stay: Payer: Managed Care, Other (non HMO) | Admitting: Internal Medicine

## 2016-11-10 ENCOUNTER — Other Ambulatory Visit: Payer: Self-pay | Admitting: Internal Medicine

## 2016-11-10 ENCOUNTER — Telehealth: Payer: Self-pay | Admitting: *Deleted

## 2016-11-10 NOTE — Telephone Encounter (Signed)
Received t/c from Unk Lightning who is expressing frustration at having so many tests/procedures scheduled and then being told they were cancelled because her insurance did not approve them. Patient is voicing frustration at this and now having to go through Christmas not knowing whether there is something serious going on with her, and not understanding why her appointment for the MRI cannot be scheduled sooner than Dec. 27th. She states she feels like she has no advocates here and is considering going to [facility] or [facility] for her care. Informed that I will speak to the schedulers on her behalf and see whether there is any way to get her MRI scheduled sooner. Ms. Sewall also asks whether she is still a candidate for the clinical trial, and I informed her that this largely depends on what the MRI shows, and that I am still following her. S/W scheduler Terrial Rhodes, who states that she too has spoken with Ms. Fesler today along with insurance verification, and that Dr. Rogue Bussing is supposed to call the patient. Meanwhile, Dr. Rogue Bussing also requested that the patient's MRI be scheduled sooner and that the patient is willing to come on Saturday or travel to [facility] to have it done. MRI was rescheduled for Friday at 3:00pm. Ms. Pettis was notified of this and she called me back and verified that she was aware of the new date and time of the MRI.  Yolande Jolly, BSN, MHA, OCN 11/10/2016 1:56 PM

## 2016-11-10 NOTE — Progress Notes (Deleted)
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  female no significant past medical history- underwent a screening mammogram that was abnormal. 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 Patient went on to have sentinel lymph node biopsy and lumpectomy. Patient is here to discuss adjuvant therapy.  Patient complains of mild numbness in the right underarm.; However this is improving. Denies any fevers or chills. Postoperative/lumpectomy course has been fairly uneventful. Denies any no unusual headaches or unusual pain anywhere.   ROS: A complete 10 point review of system is done which is negative except mentioned above in history of present illness  MEDICAL HISTORY:  Past Medical History:  Diagnosis Date  . Anemia   . Anxiety   . Bursitis of right shoulder   . Cancer ([place])    breast  . Cataract, bilateral   . Cold    states she has had it for 2 months  . Depression   . GERD (gastroesophageal reflux disease)    uses baking soda for symptoms  . Insomnia   . Lumbago   . Vitamin D deficiency     SURGICAL HISTORY: Past Surgical History:  Procedure  Laterality Date  . ABDOMINAL HYSTERECTOMY  04/20/2014  . BILATERAL SALPINGECTOMY    . BREAST LUMPECTOMY WITH SENTINEL LYMPH NODE BIOPSY Right 10/12/2016   Procedure: BREAST LUMPECTOMY WITH SENTINEL LYMPH NODE BX;  Surgeon: Christene Lye, MD;  Location: [facility];  Service: General;  Laterality: Right;  . COLONOSCOPY WITH PROPOFOL N/A 05/20/2016   Procedure: COLONOSCOPY WITH PROPOFOL;  Surgeon: Lucilla Lame, MD;  Location: [place];  Service: Endoscopy;  Laterality: N/A;  . ECTOPIC PREGNANCY SURGERY    . ENDOMETRIAL ABLATION    . PORTACATH PLACEMENT Left 11/04/2016   Procedure: INSERTION PORT-A-CATH;  Surgeon: Christene Lye, MD;  Location: [facility];  Service: General;  Laterality: Left;  Left subclavian vein  . TUBAL LIGATION      SOCIAL HISTORY: [employer]; in [place]; 1 pack/day; occassional alochol.  2 grown- children- 76 boy & 44 daughter Social History   Social History  . Marital status: Divorced    Spouse name: N/A  . Number of children: N/A  . Years of education: N/A   Occupational History  . Not on file.   Social History Main Topics  . Smoking status: Former Smoker    Packs/day: 0.50    Years: 20.00    Types: Cigarettes    Quit date: 08/23/2016  . Smokeless tobacco: Never Used  . Alcohol use 0.0 oz/week     Comment: occassional  . Drug use: No  . Sexual activity: Yes    Partners: Male  Other Topics Concern  . Not on file   Social History Narrative   She is living with [name] ( boyfriend ) for the past two years, and his 8 yo daughter just moved in with them Fall 2017. Her mother is doing drugs.    Works as [employer]    FAMILY HISTORY: 2 sisters- one sister breast ca at ? 10y; other sister- ? Cancer; aunt/mom's sister- stomach cancer [60s].  Family History  Problem Relation Age of Onset  . Depression Father   . Obesity Father   . Alcohol abuse Brother   . Seizures Brother   . Breast cancer Sister 69    ALLERGIES:  is allergic to  adhesive [tape] and nickel.  MEDICATIONS:  Current Outpatient Prescriptions  Medication Sig Dispense Refill  . azithromycin (ZITHROMAX Z-PAK) 250 MG tablet Take as directed 6 each 0  . fluticasone furoate-vilanterol (BREO ELLIPTA) 100-25 MCG/INH AEPB Inhale 1 puff into the lungs daily. 60 each 0  . lidocaine-prilocaine (EMLA) cream Apply 1 application topically as needed. Apply generously over the Mediport 45 minutes prior to chemotherapy. (Patient not taking: Reported on 10/28/2016) 30 g 0  . meloxicam (MOBIC) 15 MG tablet Take 1 tablet (15 mg total) by mouth daily. (Patient not taking: Reported on 10/26/2016) 30 tablet 2  . Multiple Vitamin (MULTIVITAMIN WITH MINERALS) TABS tablet Take 1 tablet by mouth daily. One-A-Day    . ondansetron (ZOFRAN) 8 MG tablet Take 1 tablet (8 mg total) by mouth every 8 (eight) hours as needed for nausea or vomiting (start 3 days; after chemo). (Patient not taking: Reported on 10/28/2016) 40 tablet 1  . prochlorperazine (COMPAZINE) 10 MG tablet Take 1 tablet (10 mg total) by mouth every 6 (six) hours as needed for nausea or vomiting. (Patient not taking: Reported on 10/28/2016) 40 tablet 1  . traMADol (ULTRAM) 50 MG tablet Take 1 tablet (50 mg total) by mouth every 6 (six) hours as needed. 15 tablet 0  . zolpidem (AMBIEN) 10 MG tablet Take 0.5-1 tablets (5-10 mg total) by mouth at bedtime as needed for sleep. 90 tablet 0   No current facility-administered medications for this visit.       Marland Kitchen  PHYSICAL EXAMINATION: ECOG PERFORMANCE STATUS: 0 - Asymptomatic  There were no vitals filed for this visit. There were no vitals filed for this visit.  GENERAL: Well-nourished well-developed; Alert, no distress and comfortable.   With family.  EYES: no pallor or icterus OROPHARYNX: no thrush or ulceration; good dentition  NECK: supple, no masses felt LYMPH:  no palpable lymphadenopathy in the cervical, axillary or inguinal regions LUNGS: clear to auscultation and  No  wheeze or crackles HEART/CVS: regular rate & rhythm and no murmurs; No lower extremity edema ABDOMEN: abdomen soft, non-tender and normal bowel sounds Musculoskeletal:no cyanosis of digits and no clubbing  PSYCH: alert & oriented x 3 with fluent speech NEURO: no focal motor/sensory deficits SKIN:  no rashes or significant lesions  LABORATORY DATA:  I have reviewed the data as listed Lab Results  Component Value Date   WBC 7.8 10/26/2016   HGB 13.8 10/26/2016   HCT 41.3 10/26/2016   MCV 84.2 10/26/2016   PLT 277 10/26/2016    Recent Labs  09/30/16 1421 10/26/16 1456  NA 146* 139  K 4.1 3.6  CL 101 106  CO2 28 28  GLUCOSE 107* 94  BUN 11 8  CREATININE 0.69 0.63  CALCIUM 9.6 8.9  GFRNONAA 102 >60  GFRAA 117 >60  PROT 6.7 7.0  ALBUMIN 4.3 4.1  AST 27 23  ALT 47* 24  ALKPHOS 105 77  BILITOT <0.2 0.6    RADIOGRAPHIC STUDIES: I have personally reviewed the radiological images as listed and agreed with the findings in the report. Ct Chest W Contrast  Result Date: 11/02/2016 CLINICAL DATA:  New diagnosis of right breast cancer 3 months ago. EXAM: CT CHEST WITH CONTRAST TECHNIQUE: Multidetector CT imaging of the chest was performed during intravenous contrast administration. CONTRAST:  97m ISOVUE-300 IOPAMIDOL (ISOVUE-300) INJECTION 61% COMPARISON:  None. FINDINGS: Cardiovascular: The heart size appears normal. No pericardial effusion. Aortic atherosclerosis. Mediastinum/Nodes: The trachea appears patent and is midline. Normal appearance of the esophagus. No mediastinal or hilar adenopathy identified. Right breast lesion is identified measuring 2.3 x 1.9 cm, image number 28 of series 2. No enlarged axillary or supraclavicular lymph nodes, bilaterally. Lungs/Pleura: No pleural fluid. Scattered pulmonary nodules scratch set scattered small nodules are noted within the left lung and appear most numerous within the left lower lobe posteriorly. Many of these nodules have a tree-in-bud  configuration which would be typical of that inflammatory or infectious bronchiolitis. There are no suspicious nodules identified within the right lung. Upper Abdomen: Within the posteromedial right lobe of liver there is a 1.5 cm focus of intermediate attenuation (50 HU), image number 62 of series 2. A second, indeterminate intermediate attenuation structure within right lobe of liver measures 2.2 cm, image 58 of series 2. The gallbladder is normal. No acute abnormality noted within the upper abdomen. Musculoskeletal: No aggressive lytic or sclerotic bone lesions. IMPRESSION: 1. Right breast lesion is identified which is compatible with recently diagnosed breast carcinoma. 2. There are two indeterminate, intermediate attenuating lesions within right lobe of liver. In this patient who is at increased risk recommend further investigation with either PET-CT or contrast enhanced MRI of the liver. 3. Multiple tiny nodules are identified within the left lung and appear most numerous in the left base. Many of these nodules have a tree-in-bud configuration and are favored to represent sequelae of inflammatory or infectious bronchiolitis. Electronically Signed   By: TKerby MoorsM.D.   On: 11/02/2016 13:31   Nm Sentinel Node Injection  Result Date: 10/12/2016 CLINICAL DATA:  Right breast cancer. EXAM: NUCLEAR MEDICINE BREAST LYMPHOSCINTIGRAPHY TECHNIQUE: Intradermal injection of radiopharmaceutical was performed at the 9 o'clock position around the right nipple. The patient was then sent to the operating room where the sentinel node(s) were identified and removed by the surgeon. RADIOPHARMACEUTICALS:  Total of 1 mCi Millipore-filtered Technetium-980mulfur colloid IMPRESSION: Uncomplicated intradermal injection of a total of 1 mCi Technetium-9948mlfur colloid for purposes of sentinel node identification. Electronically Signed   By: GleAletta EdouardD.   On: 10/12/2016 10:16   Dg Chest Port 1 View  Result Date:  11/04/2016 CLINICAL DATA:  Port placement EXAM: PORTABLE CHEST 1 VIEW COMPARISON:  07/05/2005 chest radiograph. FINDINGS: Left subclavian MediPort terminates over the right atrium near the portacaval junction. Stable cardiomediastinal silhouette with normal heart size. No pneumothorax. No pleural effusion. Lungs appear clear, with no acute consolidative airspace disease and no pulmonary edema. IMPRESSION: No pneumothorax. No active cardiopulmonary disease. Left subclavian MediPort terminates over the right atrium near the cavoatrial junction. Electronically Signed   By: JasIlona SorrelD.   On: 11/04/2016 12:05   Dg C-arm 1-60 Min-no Report  Result Date: 11/04/2016 There is no Radiologist interpretation  for this exam.   ASSESSMENT & PLAN:   No problem-specific Assessment &  Plan notes found for this encounter.  All questions were answered. The patient knows to call the clinic with any problems, questions or concerns.    Cammie Sickle, MD 11/10/2016 8:30 AM

## 2016-11-10 NOTE — Assessment & Plan Note (Deleted)
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 PT1cpN1sn. I would recommend adjuvant chemotherapy with dose dense Adriamycin and Cytoxan every 2 weeks 4 followed by Taxol weekly 12.  # Discussed the potential side effects including but not limited to-increasing fatigue, nausea vomiting, diarrhea, hair loss, sores in the mouth, increase risk of infection and also neuropathy.   Growth factor-Neulasta/On pro would be given as prophylaxis for chemotherapy-induced neutropenia to prevent febrile neutropenias.   # PREVENT STUDY- discussed regarding anthracycline cardiotoxicity prevention using statin. Discussed that she will need MRI of the heart prior. Patient is interested. We'll refer the patient to clinical trials.  # Genetic counseling- patient will need genetic counseling/genetic testing given history of breast cancer.   # Check labs today; follow-up in week off 18th to start chemotherapy. Communicated to Dr. Jamal Collin re: port placement.  # Chemotherapy education; Will send prescription for EMLA cream/nausea medications.

## 2016-11-10 NOTE — Telephone Encounter (Signed)
Spoke to pt re: PET scan denial; await MRI liver [on 27th as per pt];   Please try sooner appt for MRI; other locations ? [facility] or [facility] [place] location- if okay with pt]. Please let me know- tha

## 2016-11-10 NOTE — Telephone Encounter (Signed)
Patient is scheduled for MRI on 11/12/16

## 2016-11-11 ENCOUNTER — Ambulatory Visit: Payer: Managed Care, Other (non HMO)

## 2016-11-12 ENCOUNTER — Ambulatory Visit
Admission: RE | Admit: 2016-11-12 | Discharge: 2016-11-12 | Disposition: A | Discharge status: Home / Self Care | Payer: Managed Care, Other (non HMO) | Source: Ambulatory Visit | Attending: Internal Medicine | Admitting: Internal Medicine

## 2016-11-12 DIAGNOSIS — K769 Liver disease, unspecified: Secondary | ICD-10-CM | POA: Insufficient documentation

## 2016-11-12 MED ORDER — GADOBENATE DIMEGLUMINE 529 MG/ML IV SOLN
18.0000 mL | Freq: Once | INTRAVENOUS | Status: AC | PRN
Start: 2016-11-12 — End: 2016-11-12
  Administered 2016-11-12: 18 mL via INTRAVENOUS

## 2016-11-16 ENCOUNTER — Encounter: Payer: Self-pay | Admitting: Oncology

## 2016-11-16 ENCOUNTER — Inpatient Hospital Stay (HOSPITAL_BASED_OUTPATIENT_CLINIC_OR_DEPARTMENT_OTHER): Payer: Managed Care, Other (non HMO) | Admitting: Oncology

## 2016-11-16 VITALS — BP 132/77 | HR 93 | Temp 95.6°F | Wt 188.3 lb

## 2016-11-16 DIAGNOSIS — C50511 Malignant neoplasm of lower-outer quadrant of right female breast: Secondary | ICD-10-CM

## 2016-11-16 DIAGNOSIS — K769 Liver disease, unspecified: Secondary | ICD-10-CM

## 2016-11-16 DIAGNOSIS — J219 Acute bronchiolitis, unspecified: Secondary | ICD-10-CM | POA: Diagnosis not present

## 2016-11-16 DIAGNOSIS — Z79899 Other long term (current) drug therapy: Secondary | ICD-10-CM

## 2016-11-16 DIAGNOSIS — F17210 Nicotine dependence, cigarettes, uncomplicated: Secondary | ICD-10-CM

## 2016-11-16 DIAGNOSIS — Z170 Estrogen receptor positive status [ER+]: Secondary | ICD-10-CM | POA: Diagnosis not present

## 2016-11-16 NOTE — Progress Notes (Signed)
Patient here today to discuss MRI results from Friday

## 2016-11-16 NOTE — Progress Notes (Signed)
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 Rogue Bussing on 10/26/2016 at that time the plan was to proceed with adjuvant chemotherapy with dose dense AC every 2 weeks for 4 cycles followed by 12 weekly cycles of Taxol.  however patient was complaining of persistent cough and there was concern for metastatic disease and hence CT chest was obtained for the same. CT chest showed findings of treatment but appearance in the lower lobe suggestive of an inflammation process for which patient was started on azithromycin. CT also picked up liver nodules which was concerning for metastatic disease and hence MRI of the liver was ordered   Interval history- patient reports that her cough is better since starting azithromycin. Denies any productive sputum production or fevers.   ECOG PS- 0 Pain scale- 0 Opioid associated constipation- no  Review of systems- Review of Systems  Constitutional: Negative for chills, fever, malaise/fatigue and weight loss.  HENT: Negative for congestion, ear discharge and nosebleeds.   Eyes: Negative for blurred vision.  Respiratory: Positive for cough  (Improving). Negative for hemoptysis, sputum production, shortness of breath and wheezing.   Cardiovascular: Negative for chest pain, palpitations, orthopnea and claudication.  Gastrointestinal: Negative for abdominal pain, blood in stool, constipation, diarrhea, heartburn, melena, nausea and vomiting.  Genitourinary: Negative for dysuria, flank pain, frequency, hematuria and urgency.  Musculoskeletal: Negative for back pain, joint pain and myalgias.  Skin: Negative for rash.  Neurological: Negative for dizziness, tingling, focal weakness, seizures, weakness and headaches.  Endo/Heme/Allergies: Does not bruise/bleed easily.  Psychiatric/Behavioral: Negative for depression and suicidal ideas. The patient does not have insomnia.      Current treatment- yet to start  Allergies  Allergen Reactions  . Adhesive [Tape] Other (See Comments)    Blisters/rash  PLEASE USE PAPER TAPE ONLY  . Nickel     PT CAN WEAR GOLD, STERLING SILVER WITH NO PROBLEM-PT HAS NEVER HAD ANY PROBLEMS IN THE OR     Past Medical History:  Diagnosis Date  . Anemia   . Anxiety   . Bursitis of right shoulder   . Cancer ([place])    breast  . Cataract, bilateral   . Cold    states she has had it for 2 months  . Depression   . GERD (gastroesophageal reflux disease)    uses baking soda for symptoms  . Insomnia   . Lumbago   . Vitamin D deficiency      Past Surgical History:  Procedure Laterality Date  . ABDOMINAL HYSTERECTOMY  04/20/2014  . BILATERAL SALPINGECTOMY    . BREAST LUMPECTOMY WITH SENTINEL LYMPH NODE BIOPSY Right 10/12/2016   Procedure: BREAST LUMPECTOMY WITH SENTINEL LYMPH NODE  BX;  Surgeon: Christene Lye, MD;  Location: [facility];  Service: General;  Laterality: Right;  . COLONOSCOPY WITH PROPOFOL N/A 05/20/2016   Procedure: COLONOSCOPY WITH PROPOFOL;  Surgeon: Lucilla Lame, MD;  Location: [facility];  Service: Endoscopy;  Laterality: N/A;  . ECTOPIC PREGNANCY SURGERY    .  ENDOMETRIAL ABLATION    . PORTACATH PLACEMENT Left 11/04/2016   Procedure: INSERTION PORT-A-CATH;  Surgeon: Christene Lye, MD;  Location: [facility];  Service: General;  Laterality: Left;  Left subclavian vein  . TUBAL LIGATION      Social History   Social History  . Marital status: Divorced    Spouse name: N/A  . Number of children: N/A  . Years of education: N/A   Occupational History  . Not on file.   Social History Main Topics  . Smoking status: Former Smoker    Packs/day: 0.50    Years: 20.00    Types: Cigarettes    Quit date: 08/23/2016  . Smokeless tobacco: Never Used  . Alcohol use 0.0 oz/week     Comment: occassional  . Drug use: No  . Sexual activity: Yes    Partners: Male   Other Topics Concern  . Not on file   Social History Narrative   She is living with [name] ( boyfriend ) for the past two years, and his 68 yo daughter just moved in with them Fall 2017. Her mother is doing drugs.    Works as [employer]    Family History  Problem Relation Age of Onset  . Depression Father   . Obesity Father   . Alcohol abuse Brother   . Seizures Brother   . Breast cancer Sister 61     Current Outpatient Prescriptions:  .  fluticasone furoate-vilanterol (BREO ELLIPTA) 100-25 MCG/INH AEPB, Inhale 1 puff into the lungs daily., Disp: 60 each, Rfl: 0 .  lidocaine-prilocaine (EMLA) cream, Apply 1 application topically as needed. Apply generously over the Mediport 45 minutes prior to chemotherapy., Disp: 30 g, Rfl: 0 .  meloxicam (MOBIC) 15 MG tablet, Take 1 tablet (15 mg total) by mouth daily., Disp: 30 tablet, Rfl: 2 .  Multiple Vitamin (MULTIVITAMIN WITH MINERALS) TABS tablet, Take 1 tablet by mouth daily. One-A-Day, Disp: , Rfl:  .  ondansetron (ZOFRAN) 8 MG tablet, Take 1 tablet (8 mg total) by mouth every 8 (eight) hours as needed for nausea or vomiting (start 3 days; after chemo)., Disp: 40 tablet, Rfl: 1 .  prochlorperazine (COMPAZINE) 10 MG tablet, Take 1  tablet (10 mg total) by mouth every 6 (six) hours as needed for nausea or vomiting., Disp: 40 tablet, Rfl: 1 .  traMADol (ULTRAM) 50 MG tablet, Take 1 tablet (50 mg total) by mouth every 6 (six) hours as needed., Disp: 15 tablet, Rfl: 0 .  zolpidem (AMBIEN) 10 MG tablet, Take 0.5-1 tablets (5-10 mg total) by mouth at bedtime as needed for sleep., Disp: 90 tablet, Rfl: 0  Physical exam:  Vitals:   11/16/16 1137  BP: 132/77  Pulse: 93  Temp: (!) 95.6 F (35.3 C)  TempSrc: Tympanic  Weight: 188 lb 4.4 oz (85.4 kg)   Physical Exam  Constitutional: She is oriented to person, place, and time and well-developed, well-nourished, and in no distress.  HENT:  Head: Normocephalic and atraumatic.  Eyes: EOM are normal. Pupils are equal, round, and reactive to light.  Neck: Normal range of motion.  Cardiovascular: Normal rate, regular rhythm and normal heart sounds.  Pulmonary/Chest: Effort normal and breath sounds normal.  Abdominal: Soft. Bowel sounds are normal.  Neurological: She is alert and oriented to person, place, and time.  Skin: Skin is warm and dry.  Breast exam was performed in seated and lying down position. Patient is status post right lumpectomy with a well-healed surgical scar. No evidence of any palpable masses. No evidence of axillary adenopathy. No evidence of any palpable masses or lumps in the left breast. No evidence of leftt axillary adenopathy CMP Latest Ref Rng & Units 10/26/2016  Glucose 65 - 99 mg/dL 94  BUN 6 - 20 mg/dL 8  Creatinine 0.44 - 1.00 mg/dL 0.63  Sodium 135 - 145 mmol/L 139  Potassium 3.5 - 5.1 mmol/L 3.6  Chloride 101 - 111 mmol/L 106  CO2 22 - 32 mmol/L 28  Calcium 8.9 - 10.3 mg/dL 8.9  Total Protein 6.5 - 8.1 g/dL 7.0  Total Bilirubin 0.3 - 1.2 mg/dL 0.6  Alkaline Phos 38 - 126 U/L 77  AST 15 - 41 U/L 23  ALT 14 - 54 U/L 24   CBC Latest Ref Rng & Units 10/26/2016  WBC 3.6 - 11.0 K/uL 7.8  Hemoglobin 12.0 - 16.0 g/dL 13.8  Hematocrit 35.0 - 47.0  % 41.3  Platelets 150 - 440 K/uL 277    No images are attached to the encounter.  Ct Chest W Contrast  Result Date: 11/02/2016 CLINICAL DATA:  New diagnosis of right breast cancer 3 months ago. EXAM: CT CHEST WITH CONTRAST TECHNIQUE: Multidetector CT imaging of the chest was performed during intravenous contrast administration. CONTRAST:  56m ISOVUE-300 IOPAMIDOL (ISOVUE-300) INJECTION 61% COMPARISON:  None. FINDINGS: Cardiovascular: The heart size appears normal. No pericardial effusion. Aortic atherosclerosis. Mediastinum/Nodes: The trachea appears patent and is midline. Normal appearance of the esophagus. No mediastinal or hilar adenopathy identified. Right breast lesion is identified measuring 2.3 x 1.9 cm, image number 28 of series 2. No enlarged axillary or supraclavicular lymph nodes, bilaterally. Lungs/Pleura: No pleural fluid. Scattered pulmonary nodules scratch set scattered small nodules are noted within the left lung and appear most numerous within the left lower lobe posteriorly. Many of these nodules have a tree-in-bud configuration which would be typical of that inflammatory or infectious bronchiolitis. There are no suspicious nodules identified within the right lung. Upper Abdomen: Within the posteromedial right lobe of liver there is a 1.5 cm focus of intermediate attenuation (50 HU), image number 62 of series 2. A second, indeterminate intermediate attenuation structure within right lobe of liver measures 2.2 cm, image 58 of series 2. The gallbladder is normal. No acute abnormality noted within the upper abdomen. Musculoskeletal: No aggressive lytic or sclerotic bone lesions. IMPRESSION: 1. Right breast lesion is identified which is compatible with recently diagnosed breast carcinoma. 2. There are two indeterminate, intermediate attenuating lesions within right lobe of liver. In this patient who is at increased risk recommend further investigation with either PET-CT or contrast enhanced  MRI of the liver. 3. Multiple tiny nodules are identified within the left lung and appear most numerous in the left base. Many of these nodules have a tree-in-bud configuration and are favored to represent sequelae of inflammatory or infectious bronchiolitis. Electronically Signed   By: TKerby MoorsM.D.   On: 11/02/2016 13:31   Mr Abdomen W Wo Contrast  Result Date: 11/12/2016 CLINICAL DATA:  Newly diagnosed breast cancer, liver lesions on CT EXAM: MRI ABDOMEN WITHOUT AND WITH CONTRAST TECHNIQUE: Multiplanar multisequence MR imaging of the abdomen was performed both  before and after the administration of intravenous contrast. CONTRAST:  76m MULTIHANCE GADOBENATE DIMEGLUMINE 529 MG/ML IV SOLN COMPARISON:  CT chest dated 11/02/2016 FINDINGS: Lower chest: Lung bases are essentially clear. Hepatobiliary: 1.9 cm T2 hyperintense lesion with peripheral nodular discontinuous enhancement in segment 6 (series 3/ image 12), compatible with a benign hemangioma. Additional 1.6 cm T2 hyperintense lesion with peripheral nodular discontinuous enhancement in segment 6 (series 3/image 15), compatible with a benign hemangioma. These correspond to the findings are recent CT chest. At least two additional subcentimeter lesions in segment 2 (series 3/image 8) and segment 6 (series 3/image 20), mildly T2 hyperintense with peripheral enhancement, likely reflecting additional benign hemangiomas. No findings suspicious for metastatic disease. No hepatic steatosis. Gallbladder is within normal limits. No intrahepatic extrahepatic ductal dilatation. Pancreas:  Within normal limits. Spleen:  Within normal limits. Adrenals/Urinary Tract:  Adrenal glands are within normal limits. Two right renal cysts, measuring up to 2.2 cm along the posterior right upper pole (series 3/image 16). Left kidney is within normal limits. No hydronephrosis. Stomach/Bowel: Stomach is within normal limits. Visualized bowel is unremarkable. Vascular/Lymphatic:   No evidence of abdominal aortic aneurysm. No suspicious abdominal lymphadenopathy. Other:  No abdominal ascites. Musculoskeletal: No focal osseous lesions. IMPRESSION: At least four hepatic lesions which are compatible with benign hemangiomas, including two dominant lesions in segment 6 which correspond to the findings on recent CT chest. No findings suspicious for metastatic disease. Electronically Signed   By: SJulian HyM.D.   On: 11/12/2016 16:03   Dg Chest Port 1 View  Result Date: 11/04/2016 CLINICAL DATA:  Port placement EXAM: PORTABLE CHEST 1 VIEW COMPARISON:  07/05/2005 chest radiograph. FINDINGS: Left subclavian MediPort terminates over the right atrium near the portacaval junction. Stable cardiomediastinal silhouette with normal heart size. No pneumothorax. No pleural effusion. Lungs appear clear, with no acute consolidative airspace disease and no pulmonary edema. IMPRESSION: No pneumothorax. No active cardiopulmonary disease. Left subclavian MediPort terminates over the right atrium near the cavoatrial junction. Electronically Signed   By: JIlona SorrelM.D.   On: 11/04/2016 12:05   Dg C-arm 1-60 Min-no Report  Result Date: 11/04/2016 There is no Radiologist interpretation  for this exam.    Assessment and plan- Patient is a 50y.o. female with a history of stage II right breast invasive ductal carcinoma ER/PR positive HER-2/neu negative status post lumpectomy who is here to start adjuvant chemotherapy   1. I discussed the results of the MRI abdomen with the patient which shows that the liver lesions seen on the CT chest aren't in fact hemangiomas and there was no convincing evidence of metastatic disease. Hence at this point it would be okay for [place] proceed with adjuvant chemotherapy is planned. We will plan to start the first cycle of dose dense AC on 11/23/2016 with Neulasta support. We will see her 1 week after chemotherapy with labs for possible IV fluids. She will see Dr.  BRogue Bussing2 weeks after the first cycle with CBC and CMP prior to her cycle #2 of chemotherapy. I again discussed the risks and benefits of chemotherapy including all but not limited to nausea, vomiting, hair loss, risk of infections. Patient understands and agrees to proceed as planned. Her baseline echocardiogram has already been completed which shows an EF of 55-60% and it is okay to proceed with anthracycline at this time. Patient would like to continue to work through her chemotherapy and I have suggested to her that this might be difficult especially with  AC chemotherapy. She walks on weekends and hence we will start chemotherapy during the early part of the week so that she hopefully does not have continued side effects during the weekend.  2. Possible pneumonia- symptoms are much improved on azithromycin and I will hold off on giving her another dose of antibiotics at this time   Visit Diagnosis 1. 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, estrogen receptor positive ([place])      Dr. Randa Evens, MD, MPH [facility] Pager- [phone] 11/16/2016 12:48 PM

## 2016-11-17 ENCOUNTER — Ambulatory Visit: Payer: Managed Care, Other (non HMO)

## 2016-11-22 DIAGNOSIS — Z9221 Personal history of antineoplastic chemotherapy: Secondary | ICD-10-CM

## 2016-11-22 HISTORY — DX: Personal history of antineoplastic chemotherapy: Z92.21

## 2016-11-23 ENCOUNTER — Inpatient Hospital Stay: Payer: 59

## 2016-11-23 ENCOUNTER — Other Ambulatory Visit: Payer: Self-pay | Admitting: *Deleted

## 2016-11-23 ENCOUNTER — Inpatient Hospital Stay: Payer: 59 | Attending: Internal Medicine

## 2016-11-23 VITALS — BP 109/74 | HR 83 | Temp 96.5°F | Resp 20

## 2016-11-23 DIAGNOSIS — K769 Liver disease, unspecified: Secondary | ICD-10-CM | POA: Diagnosis not present

## 2016-11-23 DIAGNOSIS — K219 Gastro-esophageal reflux disease without esophagitis: Secondary | ICD-10-CM | POA: Diagnosis not present

## 2016-11-23 DIAGNOSIS — C50511 Malignant neoplasm of lower-outer quadrant of right female breast: Secondary | ICD-10-CM | POA: Insufficient documentation

## 2016-11-23 DIAGNOSIS — Z5111 Encounter for antineoplastic chemotherapy: Secondary | ICD-10-CM | POA: Diagnosis not present

## 2016-11-23 DIAGNOSIS — G4700 Insomnia, unspecified: Secondary | ICD-10-CM | POA: Insufficient documentation

## 2016-11-23 DIAGNOSIS — J069 Acute upper respiratory infection, unspecified: Secondary | ICD-10-CM | POA: Diagnosis not present

## 2016-11-23 DIAGNOSIS — Z87891 Personal history of nicotine dependence: Secondary | ICD-10-CM | POA: Diagnosis not present

## 2016-11-23 DIAGNOSIS — E559 Vitamin D deficiency, unspecified: Secondary | ICD-10-CM | POA: Insufficient documentation

## 2016-11-23 DIAGNOSIS — F329 Major depressive disorder, single episode, unspecified: Secondary | ICD-10-CM | POA: Insufficient documentation

## 2016-11-23 DIAGNOSIS — Z79899 Other long term (current) drug therapy: Secondary | ICD-10-CM | POA: Insufficient documentation

## 2016-11-23 DIAGNOSIS — Z170 Estrogen receptor positive status [ER+]: Secondary | ICD-10-CM | POA: Diagnosis not present

## 2016-11-23 DIAGNOSIS — F419 Anxiety disorder, unspecified: Secondary | ICD-10-CM | POA: Insufficient documentation

## 2016-11-23 LAB — CBC WITH DIFFERENTIAL/PLATELET
BASOS PCT: 1 %
Basophils Absolute: 0.1 10*3/uL (ref 0–0.1)
Eosinophils Absolute: 0.3 10*3/uL (ref 0–0.7)
Eosinophils Relative: 5 %
HEMATOCRIT: 38.9 % (ref 35.0–47.0)
HEMOGLOBIN: 13.3 g/dL (ref 12.0–16.0)
LYMPHS ABS: 2 10*3/uL (ref 1.0–3.6)
Lymphocytes Relative: 29 %
MCH: 28.5 pg (ref 26.0–34.0)
MCHC: 34.1 g/dL (ref 32.0–36.0)
MCV: 83.7 fL (ref 80.0–100.0)
MONO ABS: 0.5 10*3/uL (ref 0.2–0.9)
MONOS PCT: 8 %
NEUTROS ABS: 4 10*3/uL (ref 1.4–6.5)
NEUTROS PCT: 57 %
Platelets: 251 10*3/uL (ref 150–440)
RBC: 4.65 MIL/uL (ref 3.80–5.20)
RDW: 13 % (ref 11.5–14.5)
WBC: 7 10*3/uL (ref 3.6–11.0)

## 2016-11-23 LAB — COMPREHENSIVE METABOLIC PANEL
ALBUMIN: 3.7 g/dL (ref 3.5–5.0)
ALK PHOS: 85 U/L (ref 38–126)
ALT: 23 U/L (ref 14–54)
ANION GAP: 7 (ref 5–15)
AST: 20 U/L (ref 15–41)
BILIRUBIN TOTAL: 0.5 mg/dL (ref 0.3–1.2)
BUN: 16 mg/dL (ref 6–20)
CALCIUM: 8.6 mg/dL — AB (ref 8.9–10.3)
CO2: 28 mmol/L (ref 22–32)
Chloride: 102 mmol/L (ref 101–111)
Creatinine, Ser: 0.58 mg/dL (ref 0.44–1.00)
GLUCOSE: 100 mg/dL — AB (ref 65–99)
POTASSIUM: 3.8 mmol/L (ref 3.5–5.1)
Sodium: 137 mmol/L (ref 135–145)
Total Protein: 6.3 g/dL — ABNORMAL LOW (ref 6.5–8.1)

## 2016-11-23 MED ORDER — PEGFILGRASTIM 6 MG/0.6ML ~~LOC~~ PSKT
6.0000 mg | PREFILLED_SYRINGE | Freq: Once | SUBCUTANEOUS | Status: AC
  Administered 2016-11-23: 6 mg via SUBCUTANEOUS
  Filled 2016-11-23: qty 0.6

## 2016-11-23 MED ORDER — SODIUM CHLORIDE 0.9 % IV SOLN
1200.0000 mg | Freq: Once | INTRAVENOUS | Status: AC
  Administered 2016-11-23: 1200 mg via INTRAVENOUS
  Filled 2016-11-23: qty 60

## 2016-11-23 MED ORDER — PALONOSETRON HCL INJECTION 0.25 MG/5ML
0.2500 mg | Freq: Once | INTRAVENOUS | Status: AC
Start: 2016-11-23 — End: 2016-11-23
  Administered 2016-11-23: 0.25 mg via INTRAVENOUS
  Filled 2016-11-23: qty 5

## 2016-11-23 MED ORDER — SODIUM CHLORIDE 0.9 % IV SOLN
Freq: Once | INTRAVENOUS | Status: AC
  Administered 2016-11-23: 10:00:00 via INTRAVENOUS
  Filled 2016-11-23: qty 1000

## 2016-11-23 MED ORDER — SODIUM CHLORIDE 0.9% FLUSH
10.0000 mL | INTRAVENOUS | Status: DC | PRN
  Administered 2016-11-23: 10 mL
  Filled 2016-11-23: qty 10

## 2016-11-23 MED ORDER — HEPARIN SOD (PORK) LOCK FLUSH 100 UNIT/ML IV SOLN
500.0000 [IU] | Freq: Once | INTRAVENOUS | Status: AC | PRN
  Administered 2016-11-23: 500 [IU]
  Filled 2016-11-23: qty 5

## 2016-11-23 MED ORDER — SODIUM CHLORIDE 0.9 % IV SOLN
Freq: Once | INTRAVENOUS | Status: AC
  Administered 2016-11-23: 11:00:00 via INTRAVENOUS
  Filled 2016-11-23: qty 5

## 2016-11-23 MED ORDER — DOXORUBICIN HCL CHEMO IV INJECTION 2 MG/ML
60.0000 mg/m2 | Freq: Once | INTRAVENOUS | Status: AC
  Administered 2016-11-23: 122 mg via INTRAVENOUS
  Filled 2016-11-23: qty 61

## 2016-11-23 MED ORDER — LORAZEPAM 0.5 MG PO TABS: 0.5000 mg | ORAL_TABLET | Freq: Four times a day (QID) | ORAL | 0 refills | Status: DC | PRN

## 2016-11-23 NOTE — Progress Notes (Signed)
Pt c/o anticipatory nausea contributed by anxiety.  RN spoke with md. V/o to send rx for ativan

## 2016-11-30 ENCOUNTER — Inpatient Hospital Stay: Payer: 59

## 2016-11-30 ENCOUNTER — Telehealth: Payer: Self-pay | Admitting: Internal Medicine

## 2016-11-30 ENCOUNTER — Inpatient Hospital Stay (HOSPITAL_BASED_OUTPATIENT_CLINIC_OR_DEPARTMENT_OTHER): Payer: 59 | Admitting: Internal Medicine

## 2016-11-30 ENCOUNTER — Encounter: Payer: Self-pay | Admitting: *Deleted

## 2016-11-30 DIAGNOSIS — Z170 Estrogen receptor positive status [ER+]: Principal | ICD-10-CM

## 2016-11-30 DIAGNOSIS — Z79899 Other long term (current) drug therapy: Secondary | ICD-10-CM

## 2016-11-30 DIAGNOSIS — C50511 Malignant neoplasm of lower-outer quadrant of right female breast: Secondary | ICD-10-CM

## 2016-11-30 DIAGNOSIS — K769 Liver disease, unspecified: Secondary | ICD-10-CM | POA: Diagnosis not present

## 2016-11-30 DIAGNOSIS — J069 Acute upper respiratory infection, unspecified: Secondary | ICD-10-CM | POA: Diagnosis not present

## 2016-11-30 LAB — CBC WITH DIFFERENTIAL/PLATELET
BASOS PCT: 4 %
Basophils Absolute: 0.1 10*3/uL (ref 0–0.1)
EOS ABS: 0.1 10*3/uL (ref 0–0.7)
Eosinophils Relative: 4 %
HCT: 38.1 % (ref 35.0–47.0)
Hemoglobin: 12.6 g/dL (ref 12.0–16.0)
LYMPHS ABS: 0.6 10*3/uL — AB (ref 1.0–3.6)
Lymphocytes Relative: 27 %
MCH: 27.9 pg (ref 26.0–34.0)
MCHC: 33.1 g/dL (ref 32.0–36.0)
MCV: 84.3 fL (ref 80.0–100.0)
MONO ABS: 0.3 10*3/uL (ref 0.2–0.9)
Monocytes Relative: 11 %
NEUTROS ABS: 1.3 10*3/uL — AB (ref 1.4–6.5)
Neutrophils Relative %: 54 %
PLATELETS: 166 10*3/uL (ref 150–440)
RBC: 4.52 MIL/uL (ref 3.80–5.20)
RDW: 13.3 % (ref 11.5–14.5)
WBC: 2.4 10*3/uL — ABNORMAL LOW (ref 3.6–11.0)

## 2016-11-30 LAB — BASIC METABOLIC PANEL
Anion gap: 8 (ref 5–15)
BUN: 9 mg/dL (ref 6–20)
CALCIUM: 8.7 mg/dL — AB (ref 8.9–10.3)
CO2: 28 mmol/L (ref 22–32)
CREATININE: 0.73 mg/dL (ref 0.44–1.00)
Chloride: 102 mmol/L (ref 101–111)
GFR calc Af Amer: >60 mL/min (ref >60)
GLUCOSE: 88 mg/dL (ref 65–99)
Potassium: 4.1 mmol/L (ref 3.5–5.1)
Sodium: 138 mmol/L (ref 135–145)

## 2016-11-30 MED ORDER — LEVOFLOXACIN 500 MG PO TABS: 500.0000 mg | ORAL_TABLET | Freq: Every day | ORAL | 0 refills | Status: DC

## 2016-11-30 NOTE — Telephone Encounter (Signed)
Sindee called back and said to please make sure the note includes that she will be on restricted/light duty and can only do 18 hrs per week. She said it should be to the attention of Mingo Amber at fax: [phone]. Thanks.

## 2016-11-30 NOTE — Assessment & Plan Note (Addendum)
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 PT1cpN1sn. Currently on Adjuvant chemotherapy with dose dense Adriamycin-Cytoxan- Taxol w x12. Status post cycle 1 approximately 1 week ago.  # Liver lesions MRI consistent with hemangiomas.  # URI- recommend levaquin/ claritin/ sudafed.   # PREVENT STUDY- screened for the study; discussed regarding anthracycline cardiotoxicity prevention using statin. Unable to be enrolled because of logistical issues. Enrolled in DCP-001, "Use of a Clinical Trial Screening Tool to address Cancer Health Disparities and Clinical Trial Accrual" research study.  # Genetic counseling- patient had need genetic counseling/genetic testing given history of breast cancer- pending.   # follow up in 1 week/chemo/labs.

## 2016-11-30 NOTE — Progress Notes (Signed)
Met with patient today, along with her boyfriend Alinda Deem in [place] location.  Patient was previously asked by research nurse, Yolande Jolly on 11/18/2016 if she would be willing to participate in DCP-001, "Use of a Clinical Trial Screening Tool to address Cancer Health Disparities and Clinical Trial Accrual" research study.  Patient was offered this study as she was unable to enroll in PREVENT [phone] research study due to enrollment time constraints.  The DCP-001 study allows [place] to learn why patients decide not to enroll in research studies.  Patient was presented with consent form for DCP-001.  Consent form was reviewed with patient and boyfriend.  It was explained to patient that study was voluntary/optional and she could withdraw at any time.  Risks of study participation discussed.  Patient had no questions after review and signed consent.  She also signed [place] (Permission) to Hospital doctor (Release) [place] for Research and [place] for Use/Disclosure of [place].  Patient given a copy of all signed forms and copy of forms sent to medical records to be scanned into patient chart. [employer] Clinical Trial Screening Adult Case Report Form:  DCP-001 completed with patient and clinic.  I thanked patient and her boyfriend for their time and for her participation in the trial. Time spent with patient was 45 minutes.

## 2016-11-30 NOTE — Telephone Encounter (Signed)
After MD visit pt said she wants to return to work on Thursday and discussed this with Dr. Jacinto Reap. She needs note for employer detailing that she can return with light duty. She did not want to wait for hard copy because she is sick. She does not have fax number with her but will call us with # so that we can send to her employer.Thanks.

## 2016-11-30 NOTE — Progress Notes (Signed)
Patient c/o chills last night. c/o sinus congestion - coughing up yellow thick sputum. Pt c/o pressure at nasal bridge and swollen puffy eyelids.  did not take her temperature last night. She does not have a thermoter in the home. Her boyfriend who is present today -  states he will bring a thermometer to the patient's home.  pt states that she took 1 tablet of tylenol 325 mg by mouth last night at 630 pm. Boyfriend states that his little girl has also been sick with an upper respiratory infection.  Cautioned pt and pt's significant other - on hand hygiene.  States that she c/o insomnia. Currently dose reducing her Ambien.

## 2016-11-30 NOTE — Progress Notes (Signed)
[place] NOTE  Patient Care Team: Steele Sizer, MD as PCP - General (Family Medicine) Seeplaputhur Robinette Haines, MD (General Surgery) Steele Sizer, MD as Attending Physician (Family Medicine)  CHIEF COMPLAINTS/PURPOSE OF CONSULTATION:  Breast cancer  #  Oncology History   # DEC 2017- RIGHT BREAST Stage II [pT1cpN1sn; screening] ER/PR- Pos- 90%; her 2 NEU- NEG s/p Lumpect & SLNBx; Dr.Sankar ]; JAN 2018- ddAC -T x12.    # MRI liver- hemagiomas  # Genetic counseling   # TAH & BSO [Dr.Hall; [facility]; 3299]; smoking     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, estrogen receptor positive ([place])   10/26/2016 Initial Diagnosis    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, estrogen receptor positive ([place])       HISTORY OF PRESENTING ILLNESS:  Shelby Barry 51 y.o.  female above history of ER/PR positive HER-2/neu negative stage II breast cancer- status post cycle 1 of those dense Adriamycin and Cytoxan and is here for follow-up. Patient had chemotherapy approximately week ago.  Denies any significant nausea vomiting. Denies any chest pain. Admits to fatigue.   Complains of runny nose; pressure in the sinus area. No significant cough or shortness of breath. No fevers.   ROS: A complete 10 point review of system is done which is negative except mentioned above in history of present illness  MEDICAL HISTORY:  Past Medical History:  Diagnosis Date  . Anemia   . Anxiety   . Bursitis of right shoulder   . Cancer ([place])    breast  . Cataract, bilateral   . Cold    states she has had it for 2 months  . Depression   . GERD (gastroesophageal reflux disease)    uses baking soda for symptoms  . Insomnia   . Lumbago   . Vitamin D deficiency     SURGICAL HISTORY: Past Surgical History:  Procedure Laterality Date  . ABDOMINAL HYSTERECTOMY  04/20/2014  . BILATERAL SALPINGECTOMY    . BREAST LUMPECTOMY WITH  SENTINEL LYMPH NODE BIOPSY Right 10/12/2016   Procedure: BREAST LUMPECTOMY WITH SENTINEL LYMPH NODE BX;  Surgeon: Christene Lye, MD;  Location: [facility];  Service: General;  Laterality: Right;  . COLONOSCOPY WITH PROPOFOL N/A 05/20/2016   Procedure: COLONOSCOPY WITH PROPOFOL;  Surgeon: Lucilla Lame, MD;  Location: [facility];  Service: Endoscopy;  Laterality: N/A;  . ECTOPIC PREGNANCY SURGERY    . ENDOMETRIAL ABLATION    . PORTACATH PLACEMENT Left 11/04/2016   Procedure: INSERTION PORT-A-CATH;  Surgeon: Christene Lye, MD;  Location: [facility];  Service: General;  Laterality: Left;  Left subclavian vein  . TUBAL LIGATION      SOCIAL HISTORY: [employer]; in [place]; 1 pack/day; occassional alochol.  2 grown- children- 69 boy & 30 daughter Social History   Social History  . Marital status: Divorced    Spouse name: N/A  . Number of children: N/A  . Years of education: N/A   Occupational History  . Not on file.   Social History Main Topics  . Smoking status: Former Smoker    Packs/day: 0.50    Years: 20.00    Types: Cigarettes    Quit date: 08/23/2016  . Smokeless tobacco: Never Used  . Alcohol use 0.0 oz/week     Comment: occassional  . Drug use: No  . Sexual activity: Yes    Partners: Male   Other Topics Concern  . Not  on file   Social History Narrative   She is living with [name] ( boyfriend ) for the past two years, and his 78 yo daughter just moved in with them Fall 2017. Her mother is doing drugs.    Works as [employer]    FAMILY HISTORY: 2 sisters- one sister breast ca at ? 10y; other sister- ? Cancer; aunt/mom's sister- stomach cancer [60s].  Family History  Problem Relation Age of Onset  . Depression Father   . Obesity Father   . Alcohol abuse Brother   . Seizures Brother   . Breast cancer Sister 5    ALLERGIES:  is allergic to adhesive [tape] and nickel.  MEDICATIONS:  Current Outpatient Prescriptions  Medication Sig Dispense Refill   . acetaminophen (TYLENOL) 325 MG tablet Take 325 mg by mouth every 6 (six) hours as needed for fever.    . Bismuth Subsalicylate (PEPTO-BISMOL PO) Take 1 tablet by mouth daily as needed (indigestion).    . fluticasone furoate-vilanterol (BREO ELLIPTA) 100-25 MCG/INH AEPB Inhale 1 puff into the lungs daily. 60 each 0  . lidocaine-prilocaine (EMLA) cream Apply 1 application topically as needed. Apply generously over the Mediport 45 minutes prior to chemotherapy. 30 g 0  . Multiple Vitamin (MULTIVITAMIN WITH MINERALS) TABS tablet Take 1 tablet by mouth daily. One-A-Day    . ondansetron (ZOFRAN) 8 MG tablet Take 1 tablet (8 mg total) by mouth every 8 (eight) hours as needed for nausea or vomiting (start 3 days; after chemo). 40 tablet 1  . prochlorperazine (COMPAZINE) 10 MG tablet Take 1 tablet (10 mg total) by mouth every 6 (six) hours as needed for nausea or vomiting. 40 tablet 1  . zolpidem (AMBIEN) 10 MG tablet Take 0.5-1 tablets (5-10 mg total) by mouth at bedtime as needed for sleep. 90 tablet 0  . levofloxacin (LEVAQUIN) 500 MG tablet Take 1 tablet (500 mg total) by mouth daily. 7 tablet 0  . LORazepam (ATIVAN) 0.5 MG tablet Take 1 tablet (0.5 mg total) by mouth every 6 (six) hours as needed for anxiety (anticipatory nausea). (Patient not taking: Reported on 11/30/2016) 30 tablet 0   No current facility-administered medications for this visit.       Marland Kitchen  PHYSICAL EXAMINATION: ECOG PERFORMANCE STATUS: 0 - Asymptomatic  There were no vitals filed for this visit. There were no vitals filed for this visit.  GENERAL: Well-nourished well-developed; Alert, no distress and comfortable.   With family.  EYES: no pallor or icterus OROPHARYNX: no thrush or ulceration; good dentition  NECK: supple, no masses felt LYMPH:  no palpable lymphadenopathy in the cervical, axillary or inguinal regions LUNGS: clear to auscultation and  No wheeze or crackles HEART/CVS: regular rate & rhythm and no murmurs; No  lower extremity edema ABDOMEN: abdomen soft, non-tender and normal bowel sounds Musculoskeletal:no cyanosis of digits and no clubbing  PSYCH: alert & oriented x 3 with fluent speech NEURO: no focal motor/sensory deficits SKIN:  no rashes or significant lesions  LABORATORY DATA:  I have reviewed the data as listed Lab Results  Component Value Date   WBC 2.4 (L) 11/30/2016   HGB 12.6 11/30/2016   HCT 38.1 11/30/2016   MCV 84.3 11/30/2016   PLT 166 11/30/2016    Recent Labs  09/30/16 1421 10/26/16 1456 11/23/16 0951 11/30/16 1001  NA 146* 139 137 138  K 4.1 3.6 3.8 4.1  CL 101 106 102 102  CO2 _0 GLUCOSE 107* 94 100* 88  BUN _0 CREATININE 0.69 0.63 0.58 0.73  CALCIUM 9.6 8.9 8.6* 8.7*  GFRNONAA 102 >60 >60 >60  GFRAA 117 >60 >60 >60  PROT 6.7 7.0 6.3*  --   ALBUMIN 4.3 4.1 3.7  --   AST _1 --   ALT 47* 24 23  --   ALKPHOS 105 77 85  --   BILITOT <0.2 0.6 0.5  --     RADIOGRAPHIC STUDIES: I have personally reviewed the radiological images as listed and agreed with the findings in the report. Ct Chest W Contrast  Result Date: 11/02/2016 CLINICAL DATA:  New diagnosis of right breast cancer 3 months ago. EXAM: CT CHEST WITH CONTRAST TECHNIQUE: Multidetector CT imaging of the chest was performed during intravenous contrast administration. CONTRAST:  51m ISOVUE-300 IOPAMIDOL (ISOVUE-300) INJECTION 61% COMPARISON:  None. FINDINGS: Cardiovascular: The heart size appears normal. No pericardial effusion. Aortic atherosclerosis. Mediastinum/Nodes: The trachea appears patent and is midline. Normal appearance of the esophagus. No mediastinal or hilar adenopathy identified. Right breast lesion is identified measuring 2.3 x 1.9 cm, image number 28 of series 2. No enlarged axillary or supraclavicular lymph nodes, bilaterally. Lungs/Pleura: No pleural fluid. Scattered pulmonary nodules scratch set scattered small nodules are noted within the left lung and appear  most numerous within the left lower lobe posteriorly. Many of these nodules have a tree-in-bud configuration which would be typical of that inflammatory or infectious bronchiolitis. There are no suspicious nodules identified within the right lung. Upper Abdomen: Within the posteromedial right lobe of liver there is a 1.5 cm focus of intermediate attenuation (50 HU), image number 62 of series 2. A second, indeterminate intermediate attenuation structure within right lobe of liver measures 2.2 cm, image 58 of series 2. The gallbladder is normal. No acute abnormality noted within the upper abdomen. Musculoskeletal: No aggressive lytic or sclerotic bone lesions. IMPRESSION: 1. Right breast lesion is identified which is compatible with recently diagnosed breast carcinoma. 2. There are two indeterminate, intermediate attenuating lesions within right lobe of liver. In this patient who is at increased risk recommend further investigation with either PET-CT or contrast enhanced MRI of the liver. 3. Multiple tiny nodules are identified within the left lung and appear most numerous in the left base. Many of these nodules have a tree-in-bud configuration and are favored to represent sequelae of inflammatory or infectious bronchiolitis. Electronically Signed   By: TKerby MoorsM.D.   On: 11/02/2016 13:31   Mr Abdomen W Wo Contrast  Result Date: 11/12/2016 CLINICAL DATA:  Newly diagnosed breast cancer, liver lesions on CT EXAM: MRI ABDOMEN WITHOUT AND WITH CONTRAST TECHNIQUE: Multiplanar multisequence MR imaging of the abdomen was performed both before and after the administration of intravenous contrast. CONTRAST:  170mMULTIHANCE GADOBENATE DIMEGLUMINE 529 MG/ML IV SOLN COMPARISON:  CT chest dated 11/02/2016 FINDINGS: Lower chest: Lung bases are essentially clear. Hepatobiliary: 1.9 cm T2 hyperintense lesion with peripheral nodular discontinuous enhancement in segment 6 (series 3/ image 12), compatible with a benign  hemangioma. Additional 1.6 cm T2 hyperintense lesion with peripheral nodular discontinuous enhancement in segment 6 (series 3/image 15), compatible with a benign hemangioma. These correspond to the findings are recent CT chest. At least two additional subcentimeter lesions in segment 2 (series 3/image 8) and segment 6 (series 3/image 20), mildly T2 hyperintense with peripheral enhancement, likely reflecting additional benign hemangiomas. No findings suspicious for metastatic disease. No hepatic steatosis. Gallbladder is within normal limits. No intrahepatic extrahepatic ductal dilatation.  Pancreas:  Within normal limits. Spleen:  Within normal limits. Adrenals/Urinary Tract:  Adrenal glands are within normal limits. Two right renal cysts, measuring up to 2.2 cm along the posterior right upper pole (series 3/image 16). Left kidney is within normal limits. No hydronephrosis. Stomach/Bowel: Stomach is within normal limits. Visualized bowel is unremarkable. Vascular/Lymphatic:  No evidence of abdominal aortic aneurysm. No suspicious abdominal lymphadenopathy. Other:  No abdominal ascites. Musculoskeletal: No focal osseous lesions. IMPRESSION: At least four hepatic lesions which are compatible with benign hemangiomas, including two dominant lesions in segment 6 which correspond to the findings on recent CT chest. No findings suspicious for metastatic disease. Electronically Signed   By: Julian Hy M.D.   On: 11/12/2016 16:03   Dg Chest Port 1 View  Result Date: 11/04/2016 CLINICAL DATA:  Port placement EXAM: PORTABLE CHEST 1 VIEW COMPARISON:  07/05/2005 chest radiograph. FINDINGS: Left subclavian MediPort terminates over the right atrium near the portacaval junction. Stable cardiomediastinal silhouette with normal heart size. No pneumothorax. No pleural effusion. Lungs appear clear, with no acute consolidative airspace disease and no pulmonary edema. IMPRESSION: No pneumothorax. No active cardiopulmonary  disease. Left subclavian MediPort terminates over the right atrium near the cavoatrial junction. Electronically Signed   By: Ilona Sorrel M.D.   On: 11/04/2016 12:05   Dg C-arm 1-60 Min-no Report  Result Date: 11/04/2016 There is no Radiologist interpretation  for this exam.   ASSESSMENT & PLAN:   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, estrogen receptor positive ([place]) # Breast cancer-stage II ER/PR positive HER-2/neu negative. PT1cpN1sn. Currently on Adjuvant chemotherapy with dose dense Adriamycin-Cytoxan- Taxol w x12. Status post cycle 1 approximately 1 week ago.  # Liver lesions MRI consistent with hemangiomas.  # URI- recommend levaquin/ claritin/ sudafed.   # PREVENT STUDY- screened for the study; discussed regarding anthracycline cardiotoxicity prevention using statin. Unable to be enrolled because of logistical issues. Enrolled in DCP-001, "Use of a Clinical Trial Screening Tool to address Cancer Health Disparities and Clinical Trial Accrual" research study.  # Genetic counseling- patient had need genetic counseling/genetic testing given history of breast cancer- pending.   # follow up in 1 week/chemo/labs.   All questions were answered. The patient knows to call the clinic with any problems, questions or concerns.    Cammie Sickle, MD 11/30/2016 12:05 PM

## 2016-12-01 ENCOUNTER — Encounter: Payer: Self-pay | Admitting: Internal Medicine

## 2016-12-01 NOTE — Telephone Encounter (Signed)
Letter faxed on 12/01/16

## 2016-12-06 ENCOUNTER — Inpatient Hospital Stay: Payer: 59

## 2016-12-06 ENCOUNTER — Other Ambulatory Visit: Payer: Managed Care, Other (non HMO)

## 2016-12-06 DIAGNOSIS — Z170 Estrogen receptor positive status [ER+]: Principal | ICD-10-CM

## 2016-12-06 DIAGNOSIS — C50511 Malignant neoplasm of lower-outer quadrant of right female breast: Secondary | ICD-10-CM | POA: Diagnosis not present

## 2016-12-06 LAB — CBC WITH DIFFERENTIAL/PLATELET
BASOS ABS: 0 10*3/uL (ref 0–0.1)
BASOS PCT: 1 %
EOS ABS: 0 10*3/uL (ref 0–0.7)
EOS PCT: 1 %
HCT: 40.1 % (ref 35.0–47.0)
Hemoglobin: 13.5 g/dL (ref 12.0–16.0)
Lymphocytes Relative: 20 %
Lymphs Abs: 1.5 10*3/uL (ref 1.0–3.6)
MCH: 28 pg (ref 26.0–34.0)
MCHC: 33.5 g/dL (ref 32.0–36.0)
MCV: 83.6 fL (ref 80.0–100.0)
MONOS PCT: 10 %
Monocytes Absolute: 0.7 10*3/uL (ref 0.2–0.9)
NEUTROS ABS: 5.2 10*3/uL (ref 1.4–6.5)
Neutrophils Relative %: 70 %
PLATELETS: 228 10*3/uL (ref 150–440)
RBC: 4.8 MIL/uL (ref 3.80–5.20)
RDW: 13.3 % (ref 11.5–14.5)
WBC: 7.4 10*3/uL (ref 3.6–11.0)

## 2016-12-06 LAB — COMPREHENSIVE METABOLIC PANEL
ALBUMIN: 3.7 g/dL (ref 3.5–5.0)
ALT: 47 U/L (ref 14–54)
ANION GAP: 9 (ref 5–15)
AST: 36 U/L (ref 15–41)
Alkaline Phosphatase: 103 U/L (ref 38–126)
BUN: 11 mg/dL (ref 6–20)
CHLORIDE: 101 mmol/L (ref 101–111)
CO2: 28 mmol/L (ref 22–32)
Calcium: 9.1 mg/dL (ref 8.9–10.3)
Creatinine, Ser: 0.59 mg/dL (ref 0.44–1.00)
GFR calc Af Amer: >60 mL/min (ref >60)
GFR calc non Af Amer: >60 mL/min (ref >60)
GLUCOSE: 92 mg/dL (ref 65–99)
POTASSIUM: 4.5 mmol/L (ref 3.5–5.1)
SODIUM: 138 mmol/L (ref 135–145)
TOTAL PROTEIN: 6.7 g/dL (ref 6.5–8.1)
Total Bilirubin: 0.4 mg/dL (ref 0.3–1.2)

## 2016-12-07 ENCOUNTER — Inpatient Hospital Stay (HOSPITAL_BASED_OUTPATIENT_CLINIC_OR_DEPARTMENT_OTHER): Payer: 59 | Admitting: Internal Medicine

## 2016-12-07 ENCOUNTER — Inpatient Hospital Stay: Payer: 59

## 2016-12-07 ENCOUNTER — Other Ambulatory Visit: Payer: Managed Care, Other (non HMO)

## 2016-12-07 VITALS — BP 134/80 | HR 92 | Temp 97.6°F | Resp 18 | Ht 61.0 in | Wt 183.4 lb

## 2016-12-07 DIAGNOSIS — K769 Liver disease, unspecified: Secondary | ICD-10-CM | POA: Diagnosis not present

## 2016-12-07 DIAGNOSIS — C50511 Malignant neoplasm of lower-outer quadrant of right female breast: Secondary | ICD-10-CM | POA: Diagnosis not present

## 2016-12-07 DIAGNOSIS — Z79899 Other long term (current) drug therapy: Secondary | ICD-10-CM

## 2016-12-07 DIAGNOSIS — Z170 Estrogen receptor positive status [ER+]: Secondary | ICD-10-CM

## 2016-12-07 MED ORDER — SODIUM CHLORIDE 0.9 % IV SOLN: 600.0000 mg/m2 | Freq: Once | INTRAVENOUS | Status: DC

## 2016-12-07 MED ORDER — SODIUM CHLORIDE 0.9% FLUSH
10.0000 mL | INTRAVENOUS | Status: DC | PRN
  Administered 2016-12-07: 10 mL
  Filled 2016-12-07: qty 10

## 2016-12-07 MED ORDER — DOXORUBICIN HCL CHEMO IV INJECTION 2 MG/ML
60.0000 mg/m2 | Freq: Once | INTRAVENOUS | Status: AC
  Administered 2016-12-07: 122 mg via INTRAVENOUS
  Filled 2016-12-07: qty 61

## 2016-12-07 MED ORDER — HEPARIN SOD (PORK) LOCK FLUSH 100 UNIT/ML IV SOLN
500.0000 [IU] | Freq: Once | INTRAVENOUS | Status: AC | PRN
  Administered 2016-12-07: 500 [IU]
  Filled 2016-12-07: qty 5

## 2016-12-07 MED ORDER — SODIUM CHLORIDE 0.9 % IV SOLN
Freq: Once | INTRAVENOUS | Status: AC
  Administered 2016-12-07: 10:00:00 via INTRAVENOUS
  Filled 2016-12-07: qty 1000

## 2016-12-07 MED ORDER — SODIUM CHLORIDE 0.9 % IV SOLN
1200.0000 mg | Freq: Once | INTRAVENOUS | Status: AC
  Administered 2016-12-07: 1200 mg via INTRAVENOUS
  Filled 2016-12-07: qty 50

## 2016-12-07 MED ORDER — PEGFILGRASTIM 6 MG/0.6ML ~~LOC~~ PSKT
6.0000 mg | PREFILLED_SYRINGE | Freq: Once | SUBCUTANEOUS | Status: AC
  Administered 2016-12-07: 6 mg via SUBCUTANEOUS
  Filled 2016-12-07: qty 0.6

## 2016-12-07 MED ORDER — PALONOSETRON HCL INJECTION 0.25 MG/5ML
0.2500 mg | Freq: Once | INTRAVENOUS | Status: AC
  Administered 2016-12-07: 0.25 mg via INTRAVENOUS

## 2016-12-07 MED ORDER — SODIUM CHLORIDE 0.9 % IV SOLN
Freq: Once | INTRAVENOUS | Status: AC
  Administered 2016-12-07: 10:00:00 via INTRAVENOUS
  Filled 2016-12-07: qty 5

## 2016-12-07 NOTE — Progress Notes (Signed)
[place] NOTE  Patient Care Team: Steele Sizer, MD as PCP - General (Family Medicine) Seeplaputhur Robinette Haines, MD (General Surgery) Steele Sizer, MD as Attending Physician (Family Medicine)  CHIEF COMPLAINTS/PURPOSE OF CONSULTATION:  Breast cancer  #  Oncology History   # DEC 2017- RIGHT BREAST Stage II [pT1cpN1sn; screening] ER/PR- Pos- 90%; her 2 NEU- NEG s/p Lumpect & SLNBx; Dr.Sankar ]; JAN 2018- ddAC -T x12.    # MRI liver- hemagiomas  # Genetic counseling   # TAH & BSO [Dr.Hall; [facility]; 1610]; smoking     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, estrogen receptor positive ([place])   10/26/2016 Initial Diagnosis    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, estrogen receptor positive ([place])       HISTORY OF PRESENTING ILLNESS:  Shelby Barry 51 y.o.  female above history of ER/PR positive HER-2/neu negative stage II breast cancer- status post cycle 1 of those dense Adriamycin and Cytoxan and is here for follow-up. Patient had chemotherapy approximately 2 weeks ago.  In the interim patient was treated with Levaquin for bronchitis- currently improved. Cough improved. No shortness of breath. Fatigue is significantly currently improved.  Denies any significant nausea vomiting. Denies any chest pain. No rash.  ROS: A complete 10 point review of system is done which is negative except mentioned above in history of present illness  MEDICAL HISTORY:  Past Medical History:  Diagnosis Date  . Anemia   . Anxiety   . Bursitis of right shoulder   . Cancer ([place])    breast  . Cataract, bilateral   . Cold    states she has had it for 2 months  . Depression   . GERD (gastroesophageal reflux disease)    uses baking soda for symptoms  . Insomnia   . Lumbago   . Vitamin D deficiency     SURGICAL HISTORY: Past Surgical History:  Procedure Laterality Date  . ABDOMINAL HYSTERECTOMY  04/20/2014  . BILATERAL  SALPINGECTOMY    . BREAST LUMPECTOMY WITH SENTINEL LYMPH NODE BIOPSY Right 10/12/2016   Procedure: BREAST LUMPECTOMY WITH SENTINEL LYMPH NODE BX;  Surgeon: Christene Lye, MD;  Location: [facility];  Service: General;  Laterality: Right;  . COLONOSCOPY WITH PROPOFOL N/A 05/20/2016   Procedure: COLONOSCOPY WITH PROPOFOL;  Surgeon: Lucilla Lame, MD;  Location: [facility];  Service: Endoscopy;  Laterality: N/A;  . ECTOPIC PREGNANCY SURGERY    . ENDOMETRIAL ABLATION    . PORTACATH PLACEMENT Left 11/04/2016   Procedure: INSERTION PORT-A-CATH;  Surgeon: Christene Lye, MD;  Location: [facility];  Service: General;  Laterality: Left;  Left subclavian vein  . TUBAL LIGATION      SOCIAL HISTORY: [employer]; in [place]; 1 pack/day; occassional alochol.  2 grown- children- 35 boy & 68 daughter Social History   Social History  . Marital status: Divorced    Spouse name: N/A  . Number of children: N/A  . Years of education: N/A   Occupational History  . Not on file.   Social History Main Topics  . Smoking status: Former Smoker    Packs/day: 0.50    Years: 20.00    Types: Cigarettes    Quit date: 08/23/2016  . Smokeless tobacco: Never Used  . Alcohol use 0.0 oz/week     Comment: occassional  . Drug use: No  . Sexual activity: Yes    Partners: Male   Other Topics Concern  .  Not on file   Social History Narrative   She is living with [name] ( boyfriend ) for the past two years, and his 82 yo daughter just moved in with them Fall 2017. Her mother is doing drugs.    Works as [employer]    FAMILY HISTORY: 2 sisters- one sister breast ca at ? 19y; other sister- ? Cancer; aunt/mom's sister- stomach cancer [60s].  Family History  Problem Relation Age of Onset  . Depression Father   . Obesity Father   . Alcohol abuse Brother   . Seizures Brother   . Breast cancer Sister 44    ALLERGIES:  is allergic to adhesive [tape] and nickel.  MEDICATIONS:  Current Outpatient  Prescriptions  Medication Sig Dispense Refill  . Bismuth Subsalicylate (PEPTO-BISMOL PO) Take 1 tablet by mouth daily as needed (indigestion).    . fluticasone furoate-vilanterol (BREO ELLIPTA) 100-25 MCG/INH AEPB Inhale 1 puff into the lungs daily. 60 each 0  . lidocaine-prilocaine (EMLA) cream Apply 1 application topically as needed. Apply generously over the Mediport 45 minutes prior to chemotherapy. 30 g 0  . LORazepam (ATIVAN) 0.5 MG tablet Take 1 tablet (0.5 mg total) by mouth every 6 (six) hours as needed for anxiety (anticipatory nausea). 30 tablet 0  . Multiple Vitamin (MULTIVITAMIN WITH MINERALS) TABS tablet Take 1 tablet by mouth daily. One-A-Day    . zolpidem (AMBIEN) 10 MG tablet Take 0.5-1 tablets (5-10 mg total) by mouth at bedtime as needed for sleep. 90 tablet 0  . acetaminophen (TYLENOL) 325 MG tablet Take 325 mg by mouth every 6 (six) hours as needed for fever.    . ondansetron (ZOFRAN) 8 MG tablet Take 1 tablet (8 mg total) by mouth every 8 (eight) hours as needed for nausea or vomiting (start 3 days; after chemo). (Patient not taking: Reported on 12/07/2016) 40 tablet 1  . prochlorperazine (COMPAZINE) 10 MG tablet Take 1 tablet (10 mg total) by mouth every 6 (six) hours as needed for nausea or vomiting. (Patient not taking: Reported on 12/07/2016) 40 tablet 1   No current facility-administered medications for this visit.    Facility-Administered Medications Ordered in Other Visits  Medication Dose Route Frequency Provider Last Rate Last Dose  . 0.9 %  sodium chloride infusion   Intravenous Once Cammie Sickle, MD      . cyclophosphamide (CYTOXAN) 1,200 mg in sodium chloride 0.9 % 250 mL chemo infusion  1,200 mg Intravenous Once Sindy Guadeloupe, MD      . DOXOrubicin (ADRIAMYCIN) chemo injection 122 mg  60 mg/m2 (Treatment Plan Recorded) Intravenous Once Sindy Guadeloupe, MD      . fosaprepitant (EMEND) 150 mg, dexamethasone (DECADRON) 12 mg in sodium chloride 0.9 % 145 mL IVPB    Intravenous Once Sindy Guadeloupe, MD      . heparin lock flush 100 unit/mL  500 Units Intracatheter Once PRN Cammie Sickle, MD      . pegfilgrastim (NEULASTA ONPRO KIT) injection 6 mg  6 mg Subcutaneous Once Sindy Guadeloupe, MD      . sodium chloride flush (NS) 0.9 % injection 10 mL  10 mL Intracatheter PRN Cammie Sickle, MD          .  PHYSICAL EXAMINATION: ECOG PERFORMANCE STATUS: 0 - Asymptomatic  Vitals:   12/07/16 0855  BP: 134/80  Pulse: 92  Resp: 18  Temp: 97.6 F (36.4 C)   Filed Weights   12/07/16 0855  Weight: 183  lb 6.8 oz (83.2 kg)    GENERAL: Well-nourished well-developed; Alert, no distress and comfortable.   With family.  EYES: no pallor or icterus OROPHARYNX: no thrush or ulceration; good dentition  NECK: supple, no masses felt LYMPH:  no palpable lymphadenopathy in the cervical, axillary or inguinal regions LUNGS: clear to auscultation and  No wheeze or crackles HEART/CVS: regular rate & rhythm and no murmurs; No lower extremity edema ABDOMEN: abdomen soft, non-tender and normal bowel sounds Musculoskeletal:no cyanosis of digits and no clubbing  PSYCH: alert & oriented x 3 with fluent speech NEURO: no focal motor/sensory deficits SKIN:  no rashes or significant lesions  LABORATORY DATA:  I have reviewed the data as listed Lab Results  Component Value Date   WBC 7.4 12/06/2016   HGB 13.5 12/06/2016   HCT 40.1 12/06/2016   MCV 83.6 12/06/2016   PLT 228 12/06/2016    Recent Labs  10/26/16 1456 11/23/16 0951 11/30/16 1001 12/06/16 1138  NA 139 137 138 138  K 3.6 3.8 4.1 4.5  CL 106 102 102 101  CO2 '28 28 28 28  ' GLUCOSE 94 100* 88 92  BUN '8 16 9 11  ' CREATININE 0.63 0.58 0.73 0.59  CALCIUM 8.9 8.6* 8.7* 9.1  GFRNONAA >60 >60 >60 >60  GFRAA >60 >60 >60 >60  PROT 7.0 6.3*  --  6.7  ALBUMIN 4.1 3.7  --  3.7  AST 23 20  --  36  ALT 24 23  --  47  ALKPHOS 77 85  --  103  BILITOT 0.6 0.5  --  0.4    RADIOGRAPHIC STUDIES: I have  personally reviewed the radiological images as listed and agreed with the findings in the report. Mr Abdomen W Wo Contrast  Result Date: 11/12/2016 CLINICAL DATA:  Newly diagnosed breast cancer, liver lesions on CT EXAM: MRI ABDOMEN WITHOUT AND WITH CONTRAST TECHNIQUE: Multiplanar multisequence MR imaging of the abdomen was performed both before and after the administration of intravenous contrast. CONTRAST:  58m MULTIHANCE GADOBENATE DIMEGLUMINE 529 MG/ML IV SOLN COMPARISON:  CT chest dated 11/02/2016 FINDINGS: Lower chest: Lung bases are essentially clear. Hepatobiliary: 1.9 cm T2 hyperintense lesion with peripheral nodular discontinuous enhancement in segment 6 (series 3/ image 12), compatible with a benign hemangioma. Additional 1.6 cm T2 hyperintense lesion with peripheral nodular discontinuous enhancement in segment 6 (series 3/image 15), compatible with a benign hemangioma. These correspond to the findings are recent CT chest. At least two additional subcentimeter lesions in segment 2 (series 3/image 8) and segment 6 (series 3/image 20), mildly T2 hyperintense with peripheral enhancement, likely reflecting additional benign hemangiomas. No findings suspicious for metastatic disease. No hepatic steatosis. Gallbladder is within normal limits. No intrahepatic extrahepatic ductal dilatation. Pancreas:  Within normal limits. Spleen:  Within normal limits. Adrenals/Urinary Tract:  Adrenal glands are within normal limits. Two right renal cysts, measuring up to 2.2 cm along the posterior right upper pole (series 3/image 16). Left kidney is within normal limits. No hydronephrosis. Stomach/Bowel: Stomach is within normal limits. Visualized bowel is unremarkable. Vascular/Lymphatic:  No evidence of abdominal aortic aneurysm. No suspicious abdominal lymphadenopathy. Other:  No abdominal ascites. Musculoskeletal: No focal osseous lesions. IMPRESSION: At least four hepatic lesions which are compatible with benign  hemangiomas, including two dominant lesions in segment 6 which correspond to the findings on recent CT chest. No findings suspicious for metastatic disease. Electronically Signed   By: SJulian HyM.D.   On: 11/12/2016 16:03    ASSESSMENT &  PLAN:   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, estrogen receptor positive ([place]) # Breast cancer-stage II ER/PR positive HER-2/neu negative. PT1cpN1sn. Currently on Adjuvant chemotherapy with dose dense Adriamycin-Cytoxan- Taxol w x12.  Status post cycle 1 approximately 2 week ago.  # Proceed with cycle #2 today; labs- reviewed; okay.  # Genetic counseling- patient had need genetic counseling/genetic testing given history of breast cancer- pending.   # follow up in 2 week/chemo/labs.   All questions were answered. The patient knows to call the clinic with any problems, questions or concerns.    Cammie Sickle, MD 12/07/2016 9:32 AM

## 2016-12-07 NOTE — Assessment & Plan Note (Addendum)
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 PT1cpN1sn. Currently on Adjuvant chemotherapy with dose dense Adriamycin-Cytoxan- Taxol w x12.  Status post cycle 1 approximately 2 week ago.  # Proceed with cycle #2 today; labs- reviewed; okay.  # Genetic counseling- patient had need genetic counseling/genetic testing given history of breast cancer- pending.   # follow up in 2 week/chemo/labs.

## 2016-12-07 NOTE — Progress Notes (Signed)
Patient c/o insomnia.

## 2016-12-15 ENCOUNTER — Telehealth: Payer: Self-pay | Admitting: *Deleted

## 2016-12-15 MED ORDER — MAGIC MOUTHWASH W/LIDOCAINE: 10.0000 mL | Freq: Four times a day (QID) | ORAL | 3 refills | Status: DC | PRN

## 2016-12-15 NOTE — Telephone Encounter (Signed)
Called to report that she has mouth sores and is asking if it is from the chemo States she has white patches and a fever blister on her lip for the past week. Feels like it is going down her throat. Very tired and and "gives out of breath"

## 2016-12-15 NOTE — Telephone Encounter (Signed)
Per Dr Rogue Bussing, magic mouth wash to be ordered. Patient informed and asked that it be sent to [facility]

## 2016-12-20 ENCOUNTER — Inpatient Hospital Stay: Payer: 59

## 2016-12-20 DIAGNOSIS — C50511 Malignant neoplasm of lower-outer quadrant of right female breast: Secondary | ICD-10-CM | POA: Diagnosis not present

## 2016-12-20 DIAGNOSIS — Z170 Estrogen receptor positive status [ER+]: Principal | ICD-10-CM

## 2016-12-20 LAB — COMPREHENSIVE METABOLIC PANEL
ALT: 24 U/L (ref 14–54)
AST: 20 U/L (ref 15–41)
Albumin: 3.8 g/dL (ref 3.5–5.0)
Alkaline Phosphatase: 97 U/L (ref 38–126)
Anion gap: 8 (ref 5–15)
BUN: 9 mg/dL (ref 6–20)
CHLORIDE: 108 mmol/L (ref 101–111)
CO2: 27 mmol/L (ref 22–32)
CREATININE: 0.67 mg/dL (ref 0.44–1.00)
Calcium: 8.7 mg/dL — ABNORMAL LOW (ref 8.9–10.3)
Glucose, Bld: 86 mg/dL (ref 65–99)
Potassium: 3.9 mmol/L (ref 3.5–5.1)
Sodium: 143 mmol/L (ref 135–145)
TOTAL PROTEIN: 6.3 g/dL — AB (ref 6.5–8.1)
Total Bilirubin: 0.1 mg/dL — ABNORMAL LOW (ref 0.3–1.2)

## 2016-12-20 LAB — CBC WITH DIFFERENTIAL/PLATELET
Basophils Absolute: 0.1 10*3/uL (ref 0–0.1)
Basophils Relative: 1 %
EOS PCT: 0 %
Eosinophils Absolute: 0 10*3/uL (ref 0–0.7)
HCT: 38 % (ref 35.0–47.0)
Hemoglobin: 12.7 g/dL (ref 12.0–16.0)
LYMPHS ABS: 1.3 10*3/uL (ref 1.0–3.6)
LYMPHS PCT: 16 %
MCH: 28.3 pg (ref 26.0–34.0)
MCHC: 33.5 g/dL (ref 32.0–36.0)
MCV: 84.4 fL (ref 80.0–100.0)
MONO ABS: 1 10*3/uL — AB (ref 0.2–0.9)
Monocytes Relative: 13 %
Neutro Abs: 5.7 10*3/uL (ref 1.4–6.5)
Neutrophils Relative %: 70 %
PLATELETS: 199 10*3/uL (ref 150–440)
RBC: 4.5 MIL/uL (ref 3.80–5.20)
RDW: 13.8 % (ref 11.5–14.5)
WBC: 8.2 10*3/uL (ref 3.6–11.0)

## 2016-12-21 ENCOUNTER — Inpatient Hospital Stay: Payer: 59

## 2016-12-21 ENCOUNTER — Inpatient Hospital Stay (HOSPITAL_BASED_OUTPATIENT_CLINIC_OR_DEPARTMENT_OTHER): Payer: 59 | Admitting: Internal Medicine

## 2016-12-21 ENCOUNTER — Other Ambulatory Visit: Payer: Self-pay | Admitting: Internal Medicine

## 2016-12-21 VITALS — Resp 20

## 2016-12-21 DIAGNOSIS — Z79899 Other long term (current) drug therapy: Secondary | ICD-10-CM | POA: Diagnosis not present

## 2016-12-21 DIAGNOSIS — Z170 Estrogen receptor positive status [ER+]: Secondary | ICD-10-CM | POA: Diagnosis not present

## 2016-12-21 DIAGNOSIS — K769 Liver disease, unspecified: Secondary | ICD-10-CM

## 2016-12-21 DIAGNOSIS — C50511 Malignant neoplasm of lower-outer quadrant of right female breast: Secondary | ICD-10-CM

## 2016-12-21 MED ORDER — SODIUM CHLORIDE 0.9 % IV SOLN
Freq: Once | INTRAVENOUS | Status: AC
  Administered 2016-12-21: 10:00:00 via INTRAVENOUS
  Filled 2016-12-21: qty 1000

## 2016-12-21 MED ORDER — SODIUM CHLORIDE 0.9 % IV SOLN: 600.0000 mg/m2 | Freq: Once | INTRAVENOUS | Status: DC

## 2016-12-21 MED ORDER — DOXORUBICIN HCL CHEMO IV INJECTION 2 MG/ML
60.0000 mg/m2 | Freq: Once | INTRAVENOUS | Status: AC
  Administered 2016-12-21: 122 mg via INTRAVENOUS
  Filled 2016-12-21: qty 61

## 2016-12-21 MED ORDER — PALONOSETRON HCL INJECTION 0.25 MG/5ML
0.2500 mg | Freq: Once | INTRAVENOUS | Status: AC
  Administered 2016-12-21: 0.25 mg via INTRAVENOUS
  Filled 2016-12-21: qty 5

## 2016-12-21 MED ORDER — SODIUM CHLORIDE 0.9 % IV SOLN
Freq: Once | INTRAVENOUS | Status: AC
  Administered 2016-12-21: 10:00:00 via INTRAVENOUS
  Filled 2016-12-21: qty 5

## 2016-12-21 MED ORDER — PEGFILGRASTIM 6 MG/0.6ML ~~LOC~~ PSKT
6.0000 mg | PREFILLED_SYRINGE | Freq: Once | SUBCUTANEOUS | Status: AC
  Administered 2016-12-21: 6 mg via SUBCUTANEOUS
  Filled 2016-12-21: qty 0.6

## 2016-12-21 MED ORDER — SODIUM CHLORIDE 0.9% FLUSH
10.0000 mL | INTRAVENOUS | Status: DC | PRN
Start: 2016-12-21 — End: 2016-12-21
  Administered 2016-12-21: 10 mL
  Filled 2016-12-21: qty 10

## 2016-12-21 MED ORDER — HEPARIN SOD (PORK) LOCK FLUSH 100 UNIT/ML IV SOLN
500.0000 [IU] | Freq: Once | INTRAVENOUS | Status: AC | PRN
  Administered 2016-12-21: 500 [IU]

## 2016-12-21 MED ORDER — CYCLOPHOSPHAMIDE CHEMO INJECTION 1 GM
1200.0000 mg | Freq: Once | INTRAMUSCULAR | Status: AC
  Administered 2016-12-21: 1200 mg via INTRAVENOUS
  Filled 2016-12-21: qty 60

## 2016-12-21 NOTE — Progress Notes (Signed)
Patient here today for follow up.  Patient has cough and hoarseness in her voice that started couple of days ago

## 2016-12-21 NOTE — Progress Notes (Signed)
[facility] NOTE  Patient Care Team: Steele Sizer, MD as PCP - General (Family Medicine) Seeplaputhur Robinette Haines, MD (General Surgery) Steele Sizer, MD as Attending Physician (Family Medicine)  CHIEF COMPLAINTS/PURPOSE OF CONSULTATION:  Breast cancer  #  Oncology History   # DEC 2017- RIGHT BREAST Stage II [pT1cpN1sn; screening] ER/PR- Pos- 90%; her 2 NEU- NEG s/p Lumpect & SLNBx; Dr.Sankar ]; JAN 2018- ddAC -T x12.    # MRI liver- hemagiomas  # Genetic counseling   # TAH & BSO [Dr.Hall; [facility]; [id number]]; smoking     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, estrogen receptor positive ([place])   10/26/2016 Initial Diagnosis    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, estrogen receptor positive ([place])       HISTORY OF PRESENTING ILLNESS:  Shelby Barry 51 y.o.  female above history of ER/PR positive HER-2/neu negative stage II breast cancer- status post cycle 2 of  Adriamycin and Cytoxan and is here for follow-up. Patient had chemotherapy approximately 2 weeks ago.  Patient states she feels better after being treated for bronchitis but still has a residual cough. She denies fevers or chills. Her main concern today is fatigue. She states she is exhausted and emotional from loosing her hair.   Denies any significant nausea vomiting. Denies any chest pain. No rash. No mouth sores.   ROS: A complete 10 point review of system is done which is negative except mentioned above in history of present illness  MEDICAL HISTORY:  Past Medical History:  Diagnosis Date  . Anemia   . Anxiety   . Bursitis of right shoulder   . Cancer ([place])    breast  . Cataract, bilateral   . Cold    states she has had it for 2 months  . Depression   . GERD (gastroesophageal reflux disease)    uses baking soda for symptoms  . Insomnia   . Lumbago   . Vitamin D deficiency     SURGICAL HISTORY: Past Surgical History:  Procedure  Laterality Date  . ABDOMINAL HYSTERECTOMY  04/20/2014  . BILATERAL SALPINGECTOMY    . BREAST LUMPECTOMY WITH SENTINEL LYMPH NODE BIOPSY Right 10/12/2016   Procedure: BREAST LUMPECTOMY WITH SENTINEL LYMPH NODE BX;  Surgeon: Christene Lye, MD;  Location: [facility];  Service: General;  Laterality: Right;  . COLONOSCOPY WITH PROPOFOL N/A 05/20/2016   Procedure: COLONOSCOPY WITH PROPOFOL;  Surgeon: Lucilla Lame, MD;  Location: [facility];  Service: Endoscopy;  Laterality: N/A;  . ECTOPIC PREGNANCY SURGERY    . ENDOMETRIAL ABLATION    . PORTACATH PLACEMENT Left 11/04/2016   Procedure: INSERTION PORT-A-CATH;  Surgeon: Christene Lye, MD;  Location: [facility];  Service: General;  Laterality: Left;  Left subclavian vein  . TUBAL LIGATION      SOCIAL HISTORY: [employer]; in [place]; 1 pack/day; occassional alochol.  2 grown- children- 67 boy & 51 daughter Social History   Social History  . Marital status: Divorced    Spouse name: N/A  . Number of children: N/A  . Years of education: N/A   Occupational History  . Not on file.   Social History Main Topics  . Smoking status: Former Smoker    Packs/day: 0.50    Years: 20.00    Types: Cigarettes    Quit date: 08/23/2016  . Smokeless tobacco: Never Used  . Alcohol use 0.0 oz/week     Comment: occassional  .  Drug use: No  . Sexual activity: Yes    Partners: Male   Other Topics Concern  . Not on file   Social History Narrative   She is living with [name] ( boyfriend ) for the past two years, and his 6 yo daughter just moved in with them Fall 2017. Her mother is doing drugs.    Works as [employer]    FAMILY HISTORY: 2 sisters- one sister breast ca at ? 41y; other sister- ? Cancer; aunt/mom's sister- stomach cancer [60s].  Family History  Problem Relation Age of Onset  . Depression Father   . Obesity Father   . Alcohol abuse Brother   . Seizures Brother   . Breast cancer Sister 3    ALLERGIES:  is allergic to  adhesive [tape] and nickel.  MEDICATIONS:  Current Outpatient Prescriptions  Medication Sig Dispense Refill  . acetaminophen (TYLENOL) 325 MG tablet Take 325 mg by mouth every 6 (six) hours as needed for fever.    . Bismuth Subsalicylate (PEPTO-BISMOL PO) Take 1 tablet by mouth daily as needed (indigestion).    . fluticasone furoate-vilanterol (BREO ELLIPTA) 100-25 MCG/INH AEPB Inhale 1 puff into the lungs daily. 60 each 0  . lidocaine-prilocaine (EMLA) cream Apply 1 application topically as needed. Apply generously over the Mediport 45 minutes prior to chemotherapy. 30 g 0  . LORazepam (ATIVAN) 0.5 MG tablet Take 1 tablet (0.5 mg total) by mouth every 6 (six) hours as needed for anxiety (anticipatory nausea). 30 tablet 0  . magic mouthwash w/lidocaine SOLN Take 10 mLs by mouth 4 (four) times daily as needed for mouth pain. 480 mL 3  . Multiple Vitamin (MULTIVITAMIN WITH MINERALS) TABS tablet Take 1 tablet by mouth daily. One-A-Day    . ondansetron (ZOFRAN) 8 MG tablet Take 1 tablet (8 mg total) by mouth every 8 (eight) hours as needed for nausea or vomiting (start 3 days; after chemo). 40 tablet 1  . prochlorperazine (COMPAZINE) 10 MG tablet Take 1 tablet (10 mg total) by mouth every 6 (six) hours as needed for nausea or vomiting. 40 tablet 1  . zolpidem (AMBIEN) 10 MG tablet Take 0.5-1 tablets (5-10 mg total) by mouth at bedtime as needed for sleep. 90 tablet 0   No current facility-administered medications for this visit.    Facility-Administered Medications Ordered in Other Visits  Medication Dose Route Frequency Provider Last Rate Last Dose  . cyclophosphamide (CYTOXAN) 1,200 mg in sodium chloride 0.9 % 250 mL chemo infusion  1,200 mg Intravenous Once Cammie Sickle, MD      . pegfilgrastim (NEULASTA ONPRO KIT) injection 6 mg  6 mg Subcutaneous Once Cammie Sickle, MD          .  PHYSICAL EXAMINATION: ECOG PERFORMANCE STATUS: 0 - Asymptomatic  Vitals:   12/21/16 0923   BP: 127/77  Pulse: 89  Temp: 97.4 F (36.3 C)   Filed Weights   12/21/16 0923  Weight: 187 lb 6.3 oz (85 kg)    GENERAL: Well-nourished well-developed; Alert, no distress and comfortable.   With family.  EYES: no pallor or icterus OROPHARYNX: no thrush or ulceration; good dentition  NECK: supple, no masses felt LYMPH:  no palpable lymphadenopathy in the cervical, axillary or inguinal regions LUNGS: clear to auscultation and  No wheeze or crackles HEART/CVS: regular rate & rhythm and no murmurs; No lower extremity edema ABDOMEN: abdomen soft, non-tender and normal bowel sounds Musculoskeletal:no cyanosis of digits and no clubbing  PSYCH: alert &  oriented x 3 with fluent speech NEURO: no focal motor/sensory deficits SKIN:  no rashes or significant lesions  LABORATORY DATA:  I have reviewed the data as listed Lab Results  Component Value Date   WBC 8.2 12/20/2016   HGB 12.7 12/20/2016   HCT 38.0 12/20/2016   MCV 84.4 12/20/2016   PLT 199 12/20/2016    Recent Labs  11/23/16 0951 11/30/16 1001 12/06/16 1138 12/20/16 1040  NA 137 138 138 143  K 3.8 4.1 4.5 3.9  CL 102 102 101 108  CO2 '28 28 28 27  ' GLUCOSE 100* 88 92 86  BUN '16 9 11 9  ' CREATININE 0.58 0.73 0.59 0.67  CALCIUM 8.6* 8.7* 9.1 8.7*  GFRNONAA >60 >60 >60 >60  GFRAA >60 >60 >60 >60  PROT 6.3*  --  6.7 6.3*  ALBUMIN 3.7  --  3.7 3.8  AST 20  --  36 20  ALT 23  --  47 24  ALKPHOS 85  --  103 97  BILITOT 0.5  --  0.4 0.1*    RADIOGRAPHIC STUDIES: I have personally reviewed the radiological images as listed and agreed with the findings in the report. No results found.  ASSESSMENT & PLAN:   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, estrogen receptor positive ([place]) # Breast cancer-stage II ER/PR positive HER-2/neu negative. PT1cpN1sn. Currently on Adjuvant chemotherapy with Adriamycin-Cytoxan- Taxol w x12.  Status post cycle 2 approximately 2 week ago.  # Proceed with cycle #3  today; labs- reviewed; okay.  #Fatigue- Encouraged frequent rest periods.  # Genetic counseling- patient had counseling/genetic testing given history of breast cancer- Will discuss at next visit.   # follow up in 2 week//labs(CBC, CMP)/MD.   All questions were answered. The patient knows to call the clinic with any problems, questions or concerns.  Faythe Casa, NP     Jacquelin Hawking, NP 12/21/2016 11:10 AM

## 2016-12-21 NOTE — Assessment & Plan Note (Addendum)
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 PT1cpN1sn. Currently on Adjuvant chemotherapy with Adriamycin-Cytoxan- Taxol w x12.  Status post cycle 2 approximately 2 week ago.  # Proceed with cycle #3 today; labs- reviewed; okay.  #Fatigue- Encouraged frequent rest periods.  # Genetic counseling- patient had counseling/genetic testing given history of breast cancer- Will discuss at next visit.   # follow up in 2 week//labs(CBC, CMP)/MD.

## 2016-12-29 ENCOUNTER — Other Ambulatory Visit: Payer: Self-pay | Admitting: Family Medicine

## 2016-12-29 ENCOUNTER — Telehealth: Payer: Self-pay | Admitting: Family Medicine

## 2016-12-29 NOTE — Telephone Encounter (Signed)
Pt states that you guys had discussed her quitting smoking. States that chantex is not covered under her insurance. She would like to try E2 (cigerette tip). Please please send to [facility]

## 2016-12-30 NOTE — Telephone Encounter (Signed)
E-cigarettes can be purchased over the counter. It is not really recommended for smoking cessation. We can try Zyban but she would need an appointment for that. She has an appointment coming up with her oncologist. She can ask Dr. Lenetta Quaker and see if she is willing to give her a prescription.

## 2016-12-31 NOTE — Telephone Encounter (Signed)
Patient notified and will ask her oncologist during her next appt.

## 2016-12-31 NOTE — Telephone Encounter (Signed)
Unable to leave a message due to full mailbox, will try calling again later today.

## 2017-01-03 ENCOUNTER — Other Ambulatory Visit: Payer: Self-pay | Admitting: Internal Medicine

## 2017-01-03 ENCOUNTER — Inpatient Hospital Stay: Payer: 59

## 2017-01-03 DIAGNOSIS — G4700 Insomnia, unspecified: Secondary | ICD-10-CM | POA: Diagnosis not present

## 2017-01-03 DIAGNOSIS — C50511 Malignant neoplasm of lower-outer quadrant of right female breast: Secondary | ICD-10-CM

## 2017-01-03 DIAGNOSIS — F329 Major depressive disorder, single episode, unspecified: Secondary | ICD-10-CM | POA: Diagnosis not present

## 2017-01-03 DIAGNOSIS — Z79899 Other long term (current) drug therapy: Secondary | ICD-10-CM | POA: Insufficient documentation

## 2017-01-03 DIAGNOSIS — K219 Gastro-esophageal reflux disease without esophagitis: Secondary | ICD-10-CM | POA: Diagnosis not present

## 2017-01-03 DIAGNOSIS — F419 Anxiety disorder, unspecified: Secondary | ICD-10-CM | POA: Insufficient documentation

## 2017-01-03 DIAGNOSIS — Z170 Estrogen receptor positive status [ER+]: Secondary | ICD-10-CM | POA: Diagnosis not present

## 2017-01-03 DIAGNOSIS — F17210 Nicotine dependence, cigarettes, uncomplicated: Secondary | ICD-10-CM | POA: Insufficient documentation

## 2017-01-03 DIAGNOSIS — Z5111 Encounter for antineoplastic chemotherapy: Secondary | ICD-10-CM | POA: Insufficient documentation

## 2017-01-03 DIAGNOSIS — E559 Vitamin D deficiency, unspecified: Secondary | ICD-10-CM | POA: Insufficient documentation

## 2017-01-03 LAB — CBC WITH DIFFERENTIAL/PLATELET
Basophils Absolute: 0.1 10*3/uL (ref 0–0.1)
Basophils Relative: 1 %
EOS ABS: 0 10*3/uL (ref 0–0.7)
Eosinophils Relative: 0 %
HEMATOCRIT: 38.1 % (ref 35.0–47.0)
HEMOGLOBIN: 12.9 g/dL (ref 12.0–16.0)
LYMPHS ABS: 1 10*3/uL (ref 1.0–3.6)
LYMPHS PCT: 12 %
MCH: 29.3 pg (ref 26.0–34.0)
MCHC: 34 g/dL (ref 32.0–36.0)
MCV: 86.2 fL (ref 80.0–100.0)
MONOS PCT: 11 %
Monocytes Absolute: 0.9 10*3/uL (ref 0.2–0.9)
NEUTROS ABS: 6.2 10*3/uL (ref 1.4–6.5)
NEUTROS PCT: 76 %
Platelets: 216 10*3/uL (ref 150–440)
RBC: 4.42 MIL/uL (ref 3.80–5.20)
RDW: 16.1 % — ABNORMAL HIGH (ref 11.5–14.5)
WBC: 8.2 10*3/uL (ref 3.6–11.0)

## 2017-01-03 LAB — COMPREHENSIVE METABOLIC PANEL
ALK PHOS: 89 U/L (ref 38–126)
ALT: 16 U/L (ref 14–54)
AST: 14 U/L — ABNORMAL LOW (ref 15–41)
Albumin: 4.1 g/dL (ref 3.5–5.0)
Anion gap: 5 (ref 5–15)
BILIRUBIN TOTAL: 0.1 mg/dL — AB (ref 0.3–1.2)
BUN: 12 mg/dL (ref 6–20)
CALCIUM: 9.1 mg/dL (ref 8.9–10.3)
CO2: 29 mmol/L (ref 22–32)
CREATININE: 0.63 mg/dL (ref 0.44–1.00)
Chloride: 107 mmol/L (ref 101–111)
GFR calc Af Amer: >60 mL/min (ref >60)
GFR calc non Af Amer: >60 mL/min (ref >60)
Glucose, Bld: 97 mg/dL (ref 65–99)
Potassium: 4.5 mmol/L (ref 3.5–5.1)
SODIUM: 141 mmol/L (ref 135–145)
TOTAL PROTEIN: 6.6 g/dL (ref 6.5–8.1)

## 2017-01-03 NOTE — Progress Notes (Signed)
[facility] NOTE  Patient Care Team: Steele Sizer, MD as PCP - General (Family Medicine) Seeplaputhur Robinette Haines, MD (General Surgery) Steele Sizer, MD as Attending Physician (Family Medicine)  CHIEF COMPLAINTS/PURPOSE OF CONSULTATION:  Breast cancer  #  Oncology History   # DEC 2017- RIGHT BREAST Stage II [pT1cpN1sn; screening] ER/PR- Pos- 90%; her 2 NEU- NEG s/p Lumpect & SLNBx; Dr.Sankar ]; JAN 2018- ddAC -T x12.    # MRI liver- hemagiomas  # Genetic counseling   # TAH & BSO [Dr.Hall; [facility]; 5631]; smoking     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, estrogen receptor positive ([facility])   10/26/2016 Initial Diagnosis    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, estrogen receptor positive ([facility])       HISTORY OF PRESENTING ILLNESS:  Shelby Barry 51 y.o.  female above history of ER/PR positive HER-2/neu negative stage II breast cancer- status post cycle 3 of  Adriamycin and Cytoxan and is here for follow-up. Patient had chemotherapy approximately 2 weeks ago.  No nausea no vomiting. She is still able to work part-time.  She denies fevers or chills. Her main concern today is fatigue.  Denies any chest pain. No rash. No mouth sores.   ROS: A complete 10 point review of system is done which is negative except mentioned above in history of present illness  MEDICAL HISTORY:  Past Medical History:  Diagnosis Date  . Anemia   . Anxiety   . Bursitis of right shoulder   . Cancer ([place])    breast  . Cataract, bilateral   . Cold    states she has had it for 2 months  . Depression   . GERD (gastroesophageal reflux disease)    uses baking soda for symptoms  . Insomnia   . Lumbago   . Vitamin D deficiency     SURGICAL HISTORY: Past Surgical History:  Procedure Laterality Date  . ABDOMINAL HYSTERECTOMY  04/20/2014  . BILATERAL SALPINGECTOMY    . BREAST LUMPECTOMY WITH SENTINEL LYMPH NODE BIOPSY Right  10/12/2016   Procedure: BREAST LUMPECTOMY WITH SENTINEL LYMPH NODE BX;  Surgeon: Christene Lye, MD;  Location: [facility];  Service: General;  Laterality: Right;  . COLONOSCOPY WITH PROPOFOL N/A 05/20/2016   Procedure: COLONOSCOPY WITH PROPOFOL;  Surgeon: Lucilla Lame, MD;  Location: [facility];  Service: Endoscopy;  Laterality: N/A;  . ECTOPIC PREGNANCY SURGERY    . ENDOMETRIAL ABLATION    . PORTACATH PLACEMENT Left 11/04/2016   Procedure: INSERTION PORT-A-CATH;  Surgeon: Christene Lye, MD;  Location: [facility];  Service: General;  Laterality: Left;  Left subclavian vein  . TUBAL LIGATION      SOCIAL HISTORY: [employer]; in [place]; 1 pack/day; occassional alochol.  2 grown- children- 19 boy & 95 daughter Social History   Social History  . Marital status: Divorced    Spouse name: N/A  . Number of children: N/A  . Years of education: N/A   Occupational History  . Not on file.   Social History Main Topics  . Smoking status: Former Smoker    Packs/day: 0.50    Years: 20.00    Types: Cigarettes    Quit date: 08/23/2016  . Smokeless tobacco: Never Used  . Alcohol use 0.0 oz/week     Comment: occassional  . Drug use: No  . Sexual activity: Yes    Partners: Male   Other Topics Concern  .  Not on file   Social History Narrative   She is living with [name] ( boyfriend ) for the past two years, and his 18 yo daughter just moved in with them Fall 2017. Her mother is doing drugs.    Works as [employer]    FAMILY HISTORY: 2 sisters- one sister breast ca at ? 42y; other sister- ? Cancer; aunt/mom's sister- stomach cancer [60s].  Family History  Problem Relation Age of Onset  . Depression Father   . Obesity Father   . Alcohol abuse Brother   . Seizures Brother   . Breast cancer Sister 34    ALLERGIES:  is allergic to adhesive [tape] and nickel.  MEDICATIONS:  Current Outpatient Prescriptions  Medication Sig Dispense Refill  . acetaminophen (TYLENOL) 325 MG  tablet Take 325 mg by mouth every 6 (six) hours as needed for fever.    . Bismuth Subsalicylate (PEPTO-BISMOL PO) Take 1 tablet by mouth daily as needed (indigestion).    . fluticasone furoate-vilanterol (BREO ELLIPTA) 100-25 MCG/INH AEPB Inhale 1 puff into the lungs daily. 60 each 0  . lidocaine-prilocaine (EMLA) cream Apply 1 application topically as needed. Apply generously over the Mediport 45 minutes prior to chemotherapy. 30 g 0  . LORazepam (ATIVAN) 0.5 MG tablet Take 1 tablet (0.5 mg total) by mouth every 6 (six) hours as needed for anxiety (anticipatory nausea). 30 tablet 0  . magic mouthwash w/lidocaine SOLN Take 10 mLs by mouth 4 (four) times daily as needed for mouth pain. 480 mL 3  . Multiple Vitamin (MULTIVITAMIN WITH MINERALS) TABS tablet Take 1 tablet by mouth daily. One-A-Day    . zolpidem (AMBIEN) 10 MG tablet Take 0.5-1 tablets (5-10 mg total) by mouth at bedtime as needed for sleep. 90 tablet 0  . ondansetron (ZOFRAN) 8 MG tablet Take 1 tablet (8 mg total) by mouth every 8 (eight) hours as needed for nausea or vomiting (start 3 days; after chemo). (Patient not taking: Reported on 01/04/2017) 40 tablet 1  . prochlorperazine (COMPAZINE) 10 MG tablet Take 1 tablet (10 mg total) by mouth every 6 (six) hours as needed for nausea or vomiting. (Patient not taking: Reported on 01/04/2017) 40 tablet 1   No current facility-administered medications for this visit.    Facility-Administered Medications Ordered in Other Visits  Medication Dose Route Frequency Provider Last Rate Last Dose  . sodium chloride flush (NS) 0.9 % injection 10 mL  10 mL Intracatheter PRN Cammie Sickle, MD   10 mL at 01/04/17 1010      .  PHYSICAL EXAMINATION: ECOG PERFORMANCE STATUS: 0 - Asymptomatic  Vitals:   01/04/17 0945  BP: 107/66  Pulse: 94  Temp: 97.8 F (36.6 C)   Filed Weights   01/04/17 0945  Weight: 185 lb 3 oz (84 kg)    GENERAL: Well-nourished well-developed; Alert, no distress  and comfortable.   With family.  EYES: no pallor or icterus OROPHARYNX: no thrush or ulceration; good dentition  NECK: supple, no masses felt LYMPH:  no palpable lymphadenopathy in the cervical, axillary or inguinal regions LUNGS: clear to auscultation and  No wheeze or crackles HEART/CVS: regular rate & rhythm and no murmurs; No lower extremity edema ABDOMEN: abdomen soft, non-tender and normal bowel sounds Musculoskeletal:no cyanosis of digits and no clubbing  PSYCH: alert & oriented x 3 with fluent speech NEURO: no focal motor/sensory deficits SKIN:  no rashes or significant lesions  LABORATORY DATA:  I have reviewed the data as listed Lab  Results  Component Value Date   WBC 8.2 01/03/2017   HGB 12.9 01/03/2017   HCT 38.1 01/03/2017   MCV 86.2 01/03/2017   PLT 216 01/03/2017    Recent Labs  12/06/16 1138 12/20/16 1040 01/03/17 1026  NA 138 143 141  K 4.5 3.9 4.5  CL 101 108 107  CO2 _0 GLUCOSE 92 86 97  BUN _1 CREATININE 0.59 0.67 0.63  CALCIUM 9.1 8.7* 9.1  GFRNONAA >60 >60 >60  GFRAA >60 >60 >60  PROT 6.7 6.3* 6.6  ALBUMIN 3.7 3.8 4.1  AST 36 20 14*  ALT 47 24 16  ALKPHOS 103 97 89  BILITOT 0.4 0.1* 0.1*    RADIOGRAPHIC STUDIES: I have personally reviewed the radiological images as listed and agreed with the findings in the report. No results found.  ASSESSMENT & PLAN:   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, estrogen receptor positive ([place]) # Breast cancer-stage II ER/PR positive HER-2/neu negative. PT1cpN1sn. Currently on Adjuvant chemotherapy with Adriamycin-Cytoxan- Taxol w x12.  Status post cycle 3 approximately 2 week ago.  # Proceed with cycle #4 today; labs- reviewed; okay. Will start taxol weekly in 3 weeks. Also discussed re: Rt. Will plan referral towards the end of the treatment.   #Fatigue- Encouraged frequent rest periods.  # Genetic counseling- patient had counseling/genetic testing given history of  breast cancer-  awaiting the results.   # follow up in 3 weeks//labs(CBC, CMP)/MD.      Cammie Sickle, MD 01/04/2017 1:21 PM

## 2017-01-04 ENCOUNTER — Inpatient Hospital Stay: Payer: 59 | Attending: Internal Medicine | Admitting: Internal Medicine

## 2017-01-04 ENCOUNTER — Inpatient Hospital Stay: Payer: 59

## 2017-01-04 VITALS — BP 100/65 | HR 67 | Temp 97.1°F | Resp 18

## 2017-01-04 VITALS — BP 107/66 | HR 94 | Temp 97.8°F | Wt 185.2 lb

## 2017-01-04 DIAGNOSIS — Z170 Estrogen receptor positive status [ER+]: Secondary | ICD-10-CM

## 2017-01-04 DIAGNOSIS — C50511 Malignant neoplasm of lower-outer quadrant of right female breast: Secondary | ICD-10-CM

## 2017-01-04 DIAGNOSIS — Z79899 Other long term (current) drug therapy: Secondary | ICD-10-CM | POA: Diagnosis not present

## 2017-01-04 MED ORDER — DOXORUBICIN HCL CHEMO IV INJECTION 2 MG/ML
60.0000 mg/m2 | Freq: Once | INTRAVENOUS | Status: AC
  Administered 2017-01-04: 122 mg via INTRAVENOUS
  Filled 2017-01-04: qty 61

## 2017-01-04 MED ORDER — SODIUM CHLORIDE 0.9 % IV SOLN
1200.0000 mg | Freq: Once | INTRAVENOUS | Status: AC
  Administered 2017-01-04: 1200 mg via INTRAVENOUS
  Filled 2017-01-04: qty 50

## 2017-01-04 MED ORDER — HEPARIN SOD (PORK) LOCK FLUSH 100 UNIT/ML IV SOLN
INTRAVENOUS | Status: AC
  Filled 2017-01-04: qty 5

## 2017-01-04 MED ORDER — HEPARIN SOD (PORK) LOCK FLUSH 100 UNIT/ML IV SOLN
500.0000 [IU] | Freq: Once | INTRAVENOUS | Status: AC | PRN
  Administered 2017-01-04: 500 [IU]

## 2017-01-04 MED ORDER — SODIUM CHLORIDE 0.9 % IV SOLN
Freq: Once | INTRAVENOUS | Status: AC
  Administered 2017-01-04: 10:00:00 via INTRAVENOUS
  Filled 2017-01-04: qty 1000

## 2017-01-04 MED ORDER — SODIUM CHLORIDE 0.9 % IV SOLN
Freq: Once | INTRAVENOUS | Status: AC
  Administered 2017-01-04: 10:00:00 via INTRAVENOUS
  Filled 2017-01-04: qty 5

## 2017-01-04 MED ORDER — SODIUM CHLORIDE 0.9% FLUSH
10.0000 mL | INTRAVENOUS | Status: DC | PRN
  Administered 2017-01-04: 10 mL
  Filled 2017-01-04: qty 10

## 2017-01-04 MED ORDER — PEGFILGRASTIM 6 MG/0.6ML ~~LOC~~ PSKT
6.0000 mg | PREFILLED_SYRINGE | Freq: Once | SUBCUTANEOUS | Status: AC
  Administered 2017-01-04: 6 mg via SUBCUTANEOUS
  Filled 2017-01-04: qty 0.6

## 2017-01-04 MED ORDER — PALONOSETRON HCL INJECTION 0.25 MG/5ML
0.2500 mg | Freq: Once | INTRAVENOUS | Status: AC
  Administered 2017-01-04: 0.25 mg via INTRAVENOUS
  Filled 2017-01-04: qty 5

## 2017-01-04 NOTE — Progress Notes (Signed)
Patient here today for follow up.   

## 2017-01-04 NOTE — Assessment & Plan Note (Addendum)
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 PT1cpN1sn. Currently on Adjuvant chemotherapy with Adriamycin-Cytoxan- Taxol w x12.  Status post cycle 3 approximately 2 week ago.  # Proceed with cycle #4 today; labs- reviewed; okay. Will start taxol weekly in 3 weeks. Also discussed re: Rt. Will plan referral towards the end of the treatment.   #Fatigue- Encouraged frequent rest periods.  # Genetic counseling- patient had counseling/genetic testing given history of breast cancer-  awaiting the results.   # follow up in 3 weeks//labs(CBC, CMP)/MD.

## 2017-01-07 ENCOUNTER — Other Ambulatory Visit: Payer: Self-pay | Admitting: *Deleted

## 2017-01-07 MED ORDER — LORAZEPAM 0.5 MG PO TABS: 0.5000 mg | ORAL_TABLET | Freq: Four times a day (QID) | ORAL | 0 refills | Status: DC | PRN

## 2017-01-17 ENCOUNTER — Other Ambulatory Visit: Payer: Self-pay | Admitting: Internal Medicine

## 2017-01-17 ENCOUNTER — Inpatient Hospital Stay: Payer: 59

## 2017-01-17 DIAGNOSIS — C50511 Malignant neoplasm of lower-outer quadrant of right female breast: Secondary | ICD-10-CM

## 2017-01-17 DIAGNOSIS — Z170 Estrogen receptor positive status [ER+]: Principal | ICD-10-CM

## 2017-01-17 LAB — CBC WITH DIFFERENTIAL/PLATELET
Basophils Absolute: 0 10*3/uL (ref 0–0.1)
Basophils Relative: 1 %
EOS PCT: 0 %
Eosinophils Absolute: 0 10*3/uL (ref 0–0.7)
HCT: 35.8 % (ref 35.0–47.0)
Hemoglobin: 12 g/dL (ref 12.0–16.0)
LYMPHS ABS: 0.9 10*3/uL — AB (ref 1.0–3.6)
Lymphocytes Relative: 11 %
MCH: 29.7 pg (ref 26.0–34.0)
MCHC: 33.7 g/dL (ref 32.0–36.0)
MCV: 88.2 fL (ref 80.0–100.0)
MONO ABS: 0.8 10*3/uL (ref 0.2–0.9)
Monocytes Relative: 10 %
Neutro Abs: 6.5 10*3/uL (ref 1.4–6.5)
Neutrophils Relative %: 78 %
PLATELETS: 207 10*3/uL (ref 150–440)
RBC: 4.05 MIL/uL (ref 3.80–5.20)
RDW: 20.4 % — AB (ref 11.5–14.5)
WBC: 8.2 10*3/uL (ref 3.6–11.0)

## 2017-01-17 LAB — COMPREHENSIVE METABOLIC PANEL
ALT: 16 U/L (ref 14–54)
AST: 15 U/L (ref 15–41)
Albumin: 3.8 g/dL (ref 3.5–5.0)
Alkaline Phosphatase: 87 U/L (ref 38–126)
Anion gap: 7 (ref 5–15)
BUN: 10 mg/dL (ref 6–20)
CHLORIDE: 105 mmol/L (ref 101–111)
CO2: 29 mmol/L (ref 22–32)
CREATININE: 0.64 mg/dL (ref 0.44–1.00)
Calcium: 8.9 mg/dL (ref 8.9–10.3)
GFR calc non Af Amer: >60 mL/min (ref >60)
Glucose, Bld: 91 mg/dL (ref 65–99)
POTASSIUM: 4.2 mmol/L (ref 3.5–5.1)
Sodium: 141 mmol/L (ref 135–145)
Total Bilirubin: 0.1 mg/dL — ABNORMAL LOW (ref 0.3–1.2)
Total Protein: 6.5 g/dL (ref 6.5–8.1)

## 2017-01-18 ENCOUNTER — Other Ambulatory Visit: Payer: Self-pay | Admitting: Internal Medicine

## 2017-01-18 ENCOUNTER — Inpatient Hospital Stay (HOSPITAL_BASED_OUTPATIENT_CLINIC_OR_DEPARTMENT_OTHER): Payer: 59 | Admitting: Internal Medicine

## 2017-01-18 ENCOUNTER — Inpatient Hospital Stay: Payer: 59

## 2017-01-18 VITALS — BP 116/85 | HR 92 | Temp 96.8°F | Resp 18 | Ht 61.0 in | Wt 186.5 lb

## 2017-01-18 DIAGNOSIS — G4709 Other insomnia: Secondary | ICD-10-CM

## 2017-01-18 DIAGNOSIS — Z170 Estrogen receptor positive status [ER+]: Secondary | ICD-10-CM

## 2017-01-18 DIAGNOSIS — C50511 Malignant neoplasm of lower-outer quadrant of right female breast: Secondary | ICD-10-CM

## 2017-01-18 DIAGNOSIS — Z79899 Other long term (current) drug therapy: Secondary | ICD-10-CM | POA: Diagnosis not present

## 2017-01-18 MED ORDER — ZOLPIDEM TARTRATE 10 MG PO TABS: 5.0000 mg | ORAL_TABLET | Freq: Every evening | ORAL | 0 refills | Status: DC | PRN

## 2017-01-18 MED ORDER — SODIUM CHLORIDE 0.9% FLUSH
10.0000 mL | INTRAVENOUS | Status: DC | PRN
  Administered 2017-01-18: 10 mL via INTRAVENOUS
  Filled 2017-01-18: qty 10

## 2017-01-18 MED ORDER — FAMOTIDINE IN NACL 20-0.9 MG/50ML-% IV SOLN
20.0000 mg | Freq: Once | INTRAVENOUS | Status: AC
  Administered 2017-01-18: 20 mg via INTRAVENOUS
  Filled 2017-01-18: qty 50

## 2017-01-18 MED ORDER — DIPHENHYDRAMINE HCL 50 MG/ML IJ SOLN
50.0000 mg | Freq: Once | INTRAMUSCULAR | Status: AC
  Administered 2017-01-18: 50 mg via INTRAVENOUS
  Filled 2017-01-18: qty 1

## 2017-01-18 MED ORDER — PACLITAXEL CHEMO INJECTION 300 MG/50ML: 80.0000 mg/m2 | Freq: Once | INTRAVENOUS | Status: DC

## 2017-01-18 MED ORDER — SODIUM CHLORIDE 0.9 % IV SOLN
162.0000 mg | Freq: Once | INTRAVENOUS | Status: AC
  Administered 2017-01-18: 162 mg via INTRAVENOUS
  Filled 2017-01-18: qty 27

## 2017-01-18 MED ORDER — HEPARIN SOD (PORK) LOCK FLUSH 100 UNIT/ML IV SOLN
INTRAVENOUS | Status: AC
  Filled 2017-01-18: qty 5

## 2017-01-18 MED ORDER — HEPARIN SOD (PORK) LOCK FLUSH 100 UNIT/ML IV SOLN
500.0000 [IU] | Freq: Once | INTRAVENOUS | Status: AC
  Administered 2017-01-18: 500 [IU] via INTRAVENOUS

## 2017-01-18 MED ORDER — LORAZEPAM 0.5 MG PO TABS: 0.5000 mg | ORAL_TABLET | Freq: Four times a day (QID) | ORAL | 0 refills | Status: DC | PRN

## 2017-01-18 MED ORDER — SODIUM CHLORIDE 0.9 % IV SOLN
Freq: Once | INTRAVENOUS | Status: AC
Start: 2017-01-18 — End: 2017-01-18
  Administered 2017-01-18: 10:00:00 via INTRAVENOUS
  Filled 2017-01-18: qty 1000

## 2017-01-18 MED ORDER — SODIUM CHLORIDE 0.9 % IV SOLN
20.0000 mg | Freq: Once | INTRAVENOUS | Status: AC
  Administered 2017-01-18: 20 mg via INTRAVENOUS
  Filled 2017-01-18: qty 2

## 2017-01-18 NOTE — Assessment & Plan Note (Addendum)
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 PT1cpN1sn. Currently on Adjuvant chemotherapy with Adriamycin-Cytoxan- Taxol w x12.  Status post AC cycle # 4 approximately 2 week ago.  # Plan to start Taxol weekly; cycle #1 today; Labs today reviewed;  acceptable for treatment today.    # Genetic counseling- patient had counseling/genetic testing given history of breast cancer- Neg for any breast cancer predisposing deleterious genes.  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 Discussed the uncertainty of the clinical consequences of the results. discussed re: genetic counseling consultation; pt declines.   # weekly chemo/labs; follow up MD/labs/chemo- in 2 weeks.  

## 2017-01-18 NOTE — Progress Notes (Signed)
Reviewed teaching on Taxol and pre-medications.  Consent in computer and initially signed in January.

## 2017-01-18 NOTE — Progress Notes (Signed)
Patient would like a prescription for chantix. She states has taken a nicotine patch in the past (20 years ago) and also used nicotine gum. She reports that she was not able to stop smoking with these interventions. Patient experiences nausea when smoking. For this reason, She would like to stop smoking.  Patient very tearful in exam room today. States that she is not sleeping at night time. Pt requesting rf on ambien and ativan

## 2017-01-18 NOTE — Progress Notes (Signed)
While he was in [place] NOTE  Patient Care Team: Steele Sizer, MD as PCP - General (Family Medicine) Seeplaputhur Robinette Haines, MD (General Surgery) Steele Sizer, MD as Attending Physician (Family Medicine)  CHIEF COMPLAINTS/PURPOSE OF CONSULTATION:  Breast cancer  #  Oncology History   # DEC 2017- RIGHT BREAST Stage II [pT1cpN1sn; screening] ER/PR- Pos- 90%; her 2 NEU- NEG s/p Lumpect & SLNBx; Dr.Sankar ]; JAN 2018- ddAC -T x12.   # MRI liver- hemagiomas  # Genetic counseling- DICER-1 VUS**  # TAH & BSO [Dr.Hall; [facility]; 4627]; smoking     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, estrogen receptor positive ([place])   10/26/2016 Initial Diagnosis    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, estrogen receptor positive ([place])       HISTORY OF PRESENTING ILLNESS:  Shelby Barry 51 y.o.  female above history of ER/PR positive HER-2/neu negative stage II breast cancer- status post cycle 3 of  Adriamycin and Cytoxan and is here for follow-up. Patient had chemotherapy approximately 2 weeks ago.  No nausea no vomiting. She is still able to work part-time.  She denies fevers or chills.  She complains of bilateral leg swelling. Complains of fatigue. She feels stressed /depressed because of social issues.  Denies any chest pain. No rash. No mouth sores.   ROS: A complete 10 point review of system is done which is negative except mentioned above in history of present illness  MEDICAL HISTORY:  Past Medical History:  Diagnosis Date  . Anemia   . Anxiety   . Bursitis of right shoulder   . Cancer ([place])    breast  . Cataract, bilateral   . Cold    states she has had it for 2 months  . Depression   . GERD (gastroesophageal reflux disease)    uses baking soda for symptoms  . Insomnia   . Lumbago   . Vitamin D deficiency     SURGICAL HISTORY: Past Surgical History:  Procedure Laterality Date  . ABDOMINAL  HYSTERECTOMY  04/20/2014  . BILATERAL SALPINGECTOMY    . BREAST LUMPECTOMY WITH SENTINEL LYMPH NODE BIOPSY Right 10/12/2016   Procedure: BREAST LUMPECTOMY WITH SENTINEL LYMPH NODE BX;  Surgeon: Christene Lye, MD;  Location: [facility];  Service: General;  Laterality: Right;  . COLONOSCOPY WITH PROPOFOL N/A 05/20/2016   Procedure: COLONOSCOPY WITH PROPOFOL;  Surgeon: Lucilla Lame, MD;  Location: [place];  Service: Endoscopy;  Laterality: N/A;  . ECTOPIC PREGNANCY SURGERY    . ENDOMETRIAL ABLATION    . PORTACATH PLACEMENT Left 11/04/2016   Procedure: INSERTION PORT-A-CATH;  Surgeon: Christene Lye, MD;  Location: [facility];  Service: General;  Laterality: Left;  Left subclavian vein  . TUBAL LIGATION      SOCIAL HISTORY: [employer]; in [place]; 1 pack/day; occassional alochol.  2 grown- children- 58 boy & 39 daughter Social History   Social History  . Marital status: Divorced    Spouse name: N/A  . Number of children: N/A  . Years of education: N/A   Occupational History  . Not on file.   Social History Main Topics  . Smoking status: Former Smoker    Packs/day: 0.50    Years: 20.00    Types: Cigarettes    Quit date: 08/23/2016  . Smokeless tobacco: Never Used  . Alcohol use 0.0 oz/week     Comment: occassional  . Drug use: No  .  Sexual activity: Yes    Partners: Male   Other Topics Concern  . Not on file   Social History Narrative   She is living with [name] ( boyfriend ) for the past two years, and his 60 yo daughter just moved in with them Fall 2017. Her mother is doing drugs.    Works as [employer]    FAMILY HISTORY: 2 sisters- one sister breast ca at ? 47y; other sister- ? Cancer; aunt/mom's sister- stomach cancer [60s].  Family History  Problem Relation Age of Onset  . Depression Father   . Obesity Father   . Alcohol abuse Brother   . Seizures Brother   . Breast cancer Sister 24    ALLERGIES:  is allergic to adhesive [tape] and  nickel.  MEDICATIONS:  Current Outpatient Prescriptions  Medication Sig Dispense Refill  . acetaminophen (TYLENOL) 325 MG tablet Take 325 mg by mouth every 6 (six) hours as needed for fever.    . Bismuth Subsalicylate (PEPTO-BISMOL PO) Take 1 tablet by mouth daily as needed (indigestion).    . fluticasone furoate-vilanterol (BREO ELLIPTA) 100-25 MCG/INH AEPB Inhale 1 puff into the lungs daily. 60 each 0  . lidocaine-prilocaine (EMLA) cream Apply 1 application topically as needed. Apply generously over the Mediport 45 minutes prior to chemotherapy. 30 g 0  . LORazepam (ATIVAN) 0.5 MG tablet Take 1 tablet (0.5 mg total) by mouth every 6 (six) hours as needed for anxiety (anticipatory nausea). 30 tablet 0  . magic mouthwash w/lidocaine SOLN Take 10 mLs by mouth 4 (four) times daily as needed for mouth pain. 480 mL 3  . Multiple Vitamin (MULTIVITAMIN WITH MINERALS) TABS tablet Take 1 tablet by mouth daily. One-A-Day    . ondansetron (ZOFRAN) 8 MG tablet Take 1 tablet (8 mg total) by mouth every 8 (eight) hours as needed for nausea or vomiting (start 3 days; after chemo). 40 tablet 1  . prochlorperazine (COMPAZINE) 10 MG tablet Take 1 tablet (10 mg total) by mouth every 6 (six) hours as needed for nausea or vomiting. 40 tablet 1  . zolpidem (AMBIEN) 10 MG tablet Take 0.5-1 tablets (5-10 mg total) by mouth at bedtime as needed for sleep. 90 tablet 0   No current facility-administered medications for this visit.    Facility-Administered Medications Ordered in Other Visits  Medication Dose Route Frequency Provider Last Rate Last Dose  . diphenhydrAMINE (BENADRYL) injection 50 mg  50 mg Intravenous Once Cammie Sickle, MD      . famotidine (PEPCID) IVPB 20 mg premix  20 mg Intravenous Once Cammie Sickle, MD      . PACLitaxel (TAXOL) 162 mg in sodium chloride 0.9 % 250 mL chemo infusion (</= 51m/m2)  162 mg Intravenous Once GCammie Sickle MD          .  PHYSICAL  EXAMINATION: ECOG PERFORMANCE STATUS: 0 - Asymptomatic  Vitals:   01/18/17 0925  BP: 116/85  Pulse: 92  Resp: 18  Temp: (!) 96.8 F (36 C)   Filed Weights   01/18/17 0925  Weight: 186 lb 8.2 oz (84.6 kg)    GENERAL: Well-nourished well-developed; Alert, no distress and comfortable.   With family.  EYES: no pallor or icterus OROPHARYNX: no thrush or ulceration; good dentition  NECK: supple, no masses felt LYMPH:  no palpable lymphadenopathy in the cervical, axillary or inguinal regions LUNGS: clear to auscultation and  No wheeze or crackles HEART/CVS: regular rate & rhythm and no murmurs; No lower  extremity edema ABDOMEN: abdomen soft, non-tender and normal bowel sounds Musculoskeletal:no cyanosis of digits and no clubbing  PSYCH: alert & oriented x 3 with fluent speech NEURO: no focal motor/sensory deficits SKIN:  no rashes or significant lesions  LABORATORY DATA:  I have reviewed the data as listed Lab Results  Component Value Date   WBC 8.2 01/17/2017   HGB 12.0 01/17/2017   HCT 35.8 01/17/2017   MCV 88.2 01/17/2017   PLT 207 01/17/2017    Recent Labs  12/20/16 1040 01/03/17 1026 01/17/17 1105  NA 143 141 141  K 3.9 4.5 4.2  CL 108 107 105  CO2 _0 GLUCOSE 86 97 91  BUN _1 CREATININE 0.67 0.63 0.64  CALCIUM 8.7* 9.1 8.9  GFRNONAA >60 >60 >60  GFRAA >60 >60 >60  PROT 6.3* 6.6 6.5  ALBUMIN 3.8 4.1 3.8  AST 20 14* 15  ALT _2 ALKPHOS 97 89 87  BILITOT 0.1* 0.1* 0.1*    RADIOGRAPHIC STUDIES: I have personally reviewed the radiological images as listed and agreed with the findings in the report. No results found.  ASSESSMENT & PLAN:   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, estrogen receptor positive ([place]) # Breast cancer-stage II ER/PR positive HER-2/neu negative. PT1cpN1sn. Currently on Adjuvant chemotherapy with Adriamycin-Cytoxan- Taxol w x12.  Status post AC cycle # 4 approximately 2 week ago.  # Plan to  start Taxol weekly; cycle #1 today; Labs today reviewed;  acceptable for treatment today.    # Genetic counseling- patient had counseling/genetic testing given history of breast cancer- Neg for any breast cancer predisposing deleterious genes.  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 Discussed the uncertainty of the clinical consequences of the results. discussed re: genetic counseling consultation; pt declines.   # weekly chemo/labs; follow up MD/labs/chemo- in 2 weeks.     Cammie Sickle, MD 01/18/2017 10:43 AM

## 2017-01-19 ENCOUNTER — Other Ambulatory Visit: Payer: Self-pay | Admitting: *Deleted

## 2017-01-19 ENCOUNTER — Telehealth: Payer: Self-pay | Admitting: Family Medicine

## 2017-01-19 DIAGNOSIS — C50511 Malignant neoplasm of lower-outer quadrant of right female breast: Secondary | ICD-10-CM

## 2017-01-19 DIAGNOSIS — Z170 Estrogen receptor positive status [ER+]: Secondary | ICD-10-CM

## 2017-01-19 DIAGNOSIS — G4709 Other insomnia: Secondary | ICD-10-CM

## 2017-01-19 MED ORDER — LORAZEPAM 0.5 MG PO TABS: 0.5000 mg | ORAL_TABLET | Freq: Four times a day (QID) | ORAL | 3 refills | Status: DC | PRN

## 2017-01-19 MED ORDER — ZOLPIDEM TARTRATE 10 MG PO TABS: 5.0000 mg | ORAL_TABLET | Freq: Every evening | ORAL | 3 refills | Status: DC | PRN

## 2017-01-19 NOTE — Telephone Encounter (Signed)
Pt says that last year you had prescribed chantix but due to it costing to much she did not get the prescription. She is asking that you please resend script to [facility] [phone]

## 2017-01-19 NOTE — Progress Notes (Signed)
Patient's ambien and lorazepam rx must be filled [facility]. [facility]-and requested her pharmacy to send our office a request for chantix. md will not fill rx for chantix due to risk for increasing insomnia and mood chgs/depression.

## 2017-01-20 ENCOUNTER — Encounter: Payer: Self-pay | Admitting: *Deleted

## 2017-01-20 DIAGNOSIS — Z923 Personal history of irradiation: Secondary | ICD-10-CM

## 2017-01-20 HISTORY — DX: Personal history of irradiation: Z92.3

## 2017-01-20 NOTE — Telephone Encounter (Signed)
Pt verbally informed. She currently does not have an appointment scheduled and did not schedule one. But she did say okay thank you and hung up

## 2017-01-24 ENCOUNTER — Inpatient Hospital Stay: Payer: 59 | Attending: Internal Medicine

## 2017-01-24 ENCOUNTER — Other Ambulatory Visit: Payer: Self-pay | Admitting: Internal Medicine

## 2017-01-24 DIAGNOSIS — Z79899 Other long term (current) drug therapy: Secondary | ICD-10-CM | POA: Diagnosis not present

## 2017-01-24 DIAGNOSIS — F419 Anxiety disorder, unspecified: Secondary | ICD-10-CM | POA: Insufficient documentation

## 2017-01-24 DIAGNOSIS — C50511 Malignant neoplasm of lower-outer quadrant of right female breast: Secondary | ICD-10-CM | POA: Diagnosis present

## 2017-01-24 DIAGNOSIS — E559 Vitamin D deficiency, unspecified: Secondary | ICD-10-CM | POA: Diagnosis not present

## 2017-01-24 DIAGNOSIS — Z5111 Encounter for antineoplastic chemotherapy: Secondary | ICD-10-CM | POA: Insufficient documentation

## 2017-01-24 DIAGNOSIS — Z170 Estrogen receptor positive status [ER+]: Secondary | ICD-10-CM | POA: Insufficient documentation

## 2017-01-24 DIAGNOSIS — J209 Acute bronchitis, unspecified: Secondary | ICD-10-CM | POA: Insufficient documentation

## 2017-01-24 DIAGNOSIS — F329 Major depressive disorder, single episode, unspecified: Secondary | ICD-10-CM | POA: Diagnosis not present

## 2017-01-24 DIAGNOSIS — G4700 Insomnia, unspecified: Secondary | ICD-10-CM | POA: Diagnosis not present

## 2017-01-24 DIAGNOSIS — K219 Gastro-esophageal reflux disease without esophagitis: Secondary | ICD-10-CM | POA: Insufficient documentation

## 2017-01-24 DIAGNOSIS — F17210 Nicotine dependence, cigarettes, uncomplicated: Secondary | ICD-10-CM | POA: Diagnosis not present

## 2017-01-24 LAB — CBC WITH DIFFERENTIAL/PLATELET
BASOS ABS: 0 10*3/uL (ref 0–0.1)
Basophils Relative: 0 %
Eosinophils Absolute: 0 10*3/uL (ref 0–0.7)
Eosinophils Relative: 1 %
HEMATOCRIT: 36.8 % (ref 35.0–47.0)
Hemoglobin: 12.4 g/dL (ref 12.0–16.0)
LYMPHS PCT: 14 %
Lymphs Abs: 1 10*3/uL (ref 1.0–3.6)
MCH: 29.7 pg (ref 26.0–34.0)
MCHC: 33.7 g/dL (ref 32.0–36.0)
MCV: 88.2 fL (ref 80.0–100.0)
MONO ABS: 0.6 10*3/uL (ref 0.2–0.9)
Monocytes Relative: 9 %
NEUTROS ABS: 5.2 10*3/uL (ref 1.4–6.5)
Neutrophils Relative %: 76 %
Platelets: 381 10*3/uL (ref 150–440)
RBC: 4.17 MIL/uL (ref 3.80–5.20)
RDW: 20.6 % — ABNORMAL HIGH (ref 11.5–14.5)
WBC: 6.8 10*3/uL (ref 3.6–11.0)

## 2017-01-24 MED ORDER — FAMOTIDINE IN NACL 20-0.9 MG/50ML-% IV SOLN
20.0000 mg | Freq: Once | INTRAVENOUS | Status: AC
  Administered 2017-01-25: 20 mg via INTRAVENOUS
  Filled 2017-01-24: qty 50

## 2017-01-24 MED ORDER — DEXAMETHASONE SODIUM PHOSPHATE 100 MG/10ML IJ SOLN
20.0000 mg | Freq: Once | INTRAMUSCULAR | Status: AC
  Administered 2017-01-25: 20 mg via INTRAVENOUS
  Filled 2017-01-24: qty 2

## 2017-01-24 MED ORDER — PACLITAXEL CHEMO INJECTION 300 MG/50ML
80.0000 mg/m2 | Freq: Once | INTRAVENOUS | Status: AC
  Administered 2017-01-25: 162 mg via INTRAVENOUS
  Filled 2017-01-24: qty 27

## 2017-01-24 MED ORDER — DIPHENHYDRAMINE HCL 50 MG/ML IJ SOLN
25.0000 mg | Freq: Once | INTRAMUSCULAR | Status: AC
  Administered 2017-01-25: 25 mg via INTRAVENOUS
  Filled 2017-01-24: qty 1

## 2017-01-25 ENCOUNTER — Inpatient Hospital Stay: Payer: 59

## 2017-01-25 VITALS — BP 117/81 | HR 92 | Temp 97.3°F | Resp 18

## 2017-01-25 DIAGNOSIS — Z170 Estrogen receptor positive status [ER+]: Principal | ICD-10-CM

## 2017-01-25 DIAGNOSIS — C50511 Malignant neoplasm of lower-outer quadrant of right female breast: Secondary | ICD-10-CM

## 2017-01-25 MED ORDER — HEPARIN SOD (PORK) LOCK FLUSH 100 UNIT/ML IV SOLN
500.0000 [IU] | Freq: Once | INTRAVENOUS | Status: AC
  Administered 2017-01-25: 500 [IU] via INTRAVENOUS

## 2017-01-25 MED ORDER — SODIUM CHLORIDE 0.9 % IV SOLN
Freq: Once | INTRAVENOUS | Status: AC
  Administered 2017-01-25: 09:00:00 via INTRAVENOUS
  Filled 2017-01-25: qty 1000

## 2017-01-25 MED ORDER — HEPARIN SOD (PORK) LOCK FLUSH 100 UNIT/ML IV SOLN
INTRAVENOUS | Status: AC
  Filled 2017-01-25: qty 5

## 2017-01-25 MED ORDER — SODIUM CHLORIDE 0.9% FLUSH
10.0000 mL | INTRAVENOUS | Status: DC | PRN
  Administered 2017-01-25: 10 mL via INTRAVENOUS
  Filled 2017-01-25: qty 10

## 2017-01-25 NOTE — Patient Instructions (Signed)
Paclitaxel injection What is this medicine? PACLITAXEL (PAK li TAX el) is a chemotherapy drug. It targets fast dividing cells, like cancer cells, and causes these cells to die. This medicine is used to treat ovarian cancer, breast cancer, and other cancers. This medicine may be used for other purposes; ask your health care provider or pharmacist if you have questions. COMMON BRAND NAME(S): Onxol, Taxol What should I tell my health care provider before I take this medicine? They need to know if you have any of these conditions: -blood disorders -irregular heartbeat -infection (especially a virus infection such as chickenpox, cold sores, or herpes) -liver disease -previous or ongoing radiation therapy -an unusual or allergic reaction to paclitaxel, alcohol, polyoxyethylated castor oil, other chemotherapy agents, other medicines, foods, dyes, or preservatives -pregnant or trying to get pregnant -breast-feeding How should I use this medicine? This drug is given as an infusion into a vein. It is administered in a hospital or clinic by a specially trained health care professional. Talk to your pediatrician regarding the use of this medicine in children. Special care may be needed. Overdosage: If you think you have taken too much of this medicine contact a poison control center or emergency room at once. NOTE: This medicine is only for you. Do not share this medicine with others. What if I miss a dose? It is important not to miss your dose. Call your doctor or health care professional if you are unable to keep an appointment. What may interact with this medicine? Do not take this medicine with any of the following medications: -disulfiram -metronidazole This medicine may also interact with the following medications: -cyclosporine -diazepam -ketoconazole -medicines to increase blood counts like filgrastim, pegfilgrastim, sargramostim -other chemotherapy drugs like cisplatin, doxorubicin,  epirubicin, etoposide, teniposide, vincristine -quinidine -testosterone -vaccines -verapamil Talk to your doctor or health care professional before taking any of these medicines: -acetaminophen -aspirin -ibuprofen -ketoprofen -naproxen This list may not describe all possible interactions. Give your health care provider a list of all the medicines, herbs, non-prescription drugs, or dietary supplements you use. Also tell them if you smoke, drink alcohol, or use illegal drugs. Some items may interact with your medicine. What should I watch for while using this medicine? Your condition will be monitored carefully while you are receiving this medicine. You will need important blood work done while you are taking this medicine. This medicine can cause serious allergic reactions. To reduce your risk you will need to take other medicine(s) before treatment with this medicine. If you experience allergic reactions like skin rash, itching or hives, swelling of the face, lips, or tongue, tell your doctor or health care professional right away. In some cases, you may be given additional medicines to help with side effects. Follow all directions for their use. This drug may make you feel generally unwell. This is not uncommon, as chemotherapy can affect healthy cells as well as cancer cells. Report any side effects. Continue your course of treatment even though you feel ill unless your doctor tells you to stop. Call your doctor or health care professional for advice if you get a fever, chills or sore throat, or other symptoms of a cold or flu. Do not treat yourself. This drug decreases your body's ability to fight infections. Try to avoid being around people who are sick. This medicine may increase your risk to bruise or bleed. Call your doctor or health care professional if you notice any unusual bleeding. Be careful brushing and flossing your teeth or   using a toothpick because you may get an infection or  bleed more easily. If you have any dental work done, tell your dentist you are receiving this medicine. Avoid taking products that contain aspirin, acetaminophen, ibuprofen, naproxen, or ketoprofen unless instructed by your doctor. These medicines may hide a fever. Do not become pregnant while taking this medicine. Women should inform their doctor if they wish to become pregnant or think they might be pregnant. There is a potential for serious side effects to an unborn child. Talk to your health care professional or pharmacist for more information. Do not breast-feed an infant while taking this medicine. Men are advised not to father a child while receiving this medicine. This product may contain alcohol. Ask your pharmacist or healthcare provider if this medicine contains alcohol. Be sure to tell all healthcare providers you are taking this medicine. Certain medicines, like metronidazole and disulfiram, can cause an unpleasant reaction when taken with alcohol. The reaction includes flushing, headache, nausea, vomiting, sweating, and increased thirst. The reaction can last from 30 minutes to several hours. What side effects may I notice from receiving this medicine? Side effects that you should report to your doctor or health care professional as soon as possible: -allergic reactions like skin rash, itching or hives, swelling of the face, lips, or tongue -low blood counts - This drug may decrease the number of white blood cells, red blood cells and platelets. You may be at increased risk for infections and bleeding. -signs of infection - fever or chills, cough, sore throat, pain or difficulty passing urine -signs of decreased platelets or bleeding - bruising, pinpoint red spots on the skin, black, tarry stools, nosebleeds -signs of decreased red blood cells - unusually weak or tired, fainting spells, lightheadedness -breathing problems -chest pain -high or low blood pressure -mouth sores -nausea and  vomiting -pain, swelling, redness or irritation at the injection site -pain, tingling, numbness in the hands or feet -slow or irregular heartbeat -swelling of the ankle, feet, hands Side effects that usually do not require medical attention (report to your doctor or health care professional if they continue or are bothersome): -bone pain -complete hair loss including hair on your head, underarms, pubic hair, eyebrows, and eyelashes -changes in the color of fingernails -diarrhea -loosening of the fingernails -loss of appetite -muscle or joint pain -red flush to skin -sweating This list may not describe all possible side effects. Call your doctor for medical advice about side effects. You may report side effects to FDA at 1-800-FDA-1088. Where should I keep my medicine? This drug is given in a hospital or clinic and will not be stored at home. NOTE: This sheet is a summary. It may not cover all possible information. If you have questions about this medicine, talk to your doctor, pharmacist, or health care provider.  2018 Elsevier/Gold Standard (2015-09-09 19:58:00)  

## 2017-01-25 NOTE — Progress Notes (Signed)
Patient reported that she had muscle aches after last infusion.  Patient advised to increase fluid intake and to take an antiinflammatory for the muscle aches if she can tolerate those.

## 2017-02-01 ENCOUNTER — Ambulatory Visit
Admission: RE | Admit: 2017-02-01 | Discharge: 2017-02-01 | Disposition: A | Discharge status: Home / Self Care | Payer: 59 | Source: Ambulatory Visit | Attending: Internal Medicine | Admitting: Internal Medicine

## 2017-02-01 ENCOUNTER — Telehealth: Payer: Self-pay | Admitting: *Deleted

## 2017-02-01 ENCOUNTER — Inpatient Hospital Stay: Payer: 59

## 2017-02-01 ENCOUNTER — Inpatient Hospital Stay (HOSPITAL_BASED_OUTPATIENT_CLINIC_OR_DEPARTMENT_OTHER): Payer: 59 | Admitting: Internal Medicine

## 2017-02-01 VITALS — BP 128/80 | HR 93 | Temp 97.8°F | Resp 18 | Wt 184.1 lb

## 2017-02-01 DIAGNOSIS — J209 Acute bronchitis, unspecified: Secondary | ICD-10-CM

## 2017-02-01 DIAGNOSIS — C50511 Malignant neoplasm of lower-outer quadrant of right female breast: Secondary | ICD-10-CM | POA: Diagnosis not present

## 2017-02-01 DIAGNOSIS — R059 Cough, unspecified: Secondary | ICD-10-CM

## 2017-02-01 DIAGNOSIS — Z170 Estrogen receptor positive status [ER+]: Secondary | ICD-10-CM | POA: Insufficient documentation

## 2017-02-01 DIAGNOSIS — R918 Other nonspecific abnormal finding of lung field: Secondary | ICD-10-CM | POA: Diagnosis not present

## 2017-02-01 DIAGNOSIS — F17210 Nicotine dependence, cigarettes, uncomplicated: Secondary | ICD-10-CM | POA: Diagnosis not present

## 2017-02-01 DIAGNOSIS — R05 Cough: Secondary | ICD-10-CM | POA: Insufficient documentation

## 2017-02-01 DIAGNOSIS — Z79899 Other long term (current) drug therapy: Secondary | ICD-10-CM | POA: Diagnosis not present

## 2017-02-01 LAB — CBC WITH DIFFERENTIAL/PLATELET
BASOS ABS: 0.2 10*3/uL — AB (ref 0–0.1)
BASOS PCT: 3 %
EOS ABS: 0.1 10*3/uL (ref 0–0.7)
Eosinophils Relative: 2 %
HCT: 37.5 % (ref 35.0–47.0)
HEMOGLOBIN: 12.7 g/dL (ref 12.0–16.0)
Lymphocytes Relative: 17 %
Lymphs Abs: 1 10*3/uL (ref 1.0–3.6)
MCH: 30.2 pg (ref 26.0–34.0)
MCHC: 33.8 g/dL (ref 32.0–36.0)
MCV: 89.4 fL (ref 80.0–100.0)
MONOS PCT: 14 %
Monocytes Absolute: 0.8 10*3/uL (ref 0.2–0.9)
NEUTROS PCT: 64 %
Neutro Abs: 3.7 10*3/uL (ref 1.4–6.5)
Platelets: 278 10*3/uL (ref 150–440)
RBC: 4.2 MIL/uL (ref 3.80–5.20)
RDW: 21.1 % — ABNORMAL HIGH (ref 11.5–14.5)
WBC: 5.7 10*3/uL (ref 3.6–11.0)

## 2017-02-01 LAB — COMPREHENSIVE METABOLIC PANEL
ALBUMIN: 3.8 g/dL (ref 3.5–5.0)
ALK PHOS: 88 U/L (ref 38–126)
ALT: 34 U/L (ref 14–54)
ANION GAP: 7 (ref 5–15)
AST: 21 U/L (ref 15–41)
BUN: 15 mg/dL (ref 6–20)
CO2: 30 mmol/L (ref 22–32)
Calcium: 9.1 mg/dL (ref 8.9–10.3)
Chloride: 103 mmol/L (ref 101–111)
Creatinine, Ser: 0.54 mg/dL (ref 0.44–1.00)
GFR calc Af Amer: >60 mL/min (ref >60)
GFR calc non Af Amer: >60 mL/min (ref >60)
GLUCOSE: 96 mg/dL (ref 65–99)
POTASSIUM: 4.3 mmol/L (ref 3.5–5.1)
SODIUM: 140 mmol/L (ref 135–145)
Total Bilirubin: <0.1 mg/dL — ABNORMAL LOW (ref 0.3–1.2)
Total Protein: 7.1 g/dL (ref 6.5–8.1)

## 2017-02-01 MED ORDER — PREDNISONE 20 MG PO TABS: ORAL_TABLET | ORAL | 0 refills | Status: DC

## 2017-02-01 MED ORDER — LEVOFLOXACIN 500 MG PO TABS: 500.0000 mg | ORAL_TABLET | Freq: Every day | ORAL | 0 refills | Status: DC

## 2017-02-01 NOTE — Progress Notes (Signed)
While he was in [place] NOTE  Patient Care Team: Steele Sizer, MD as PCP - General (Family Medicine) Seeplaputhur Robinette Haines, MD (General Surgery) Steele Sizer, MD as Attending Physician (Family Medicine)  CHIEF COMPLAINTS/PURPOSE OF CONSULTATION:  Breast cancer  #  Oncology History   # DEC 2017- RIGHT BREAST Stage II [pT1cpN1sn; screening] ER/PR- Pos- 90%; her 2 NEU- NEG s/p Lumpect & SLNBx; Dr.Sankar ]; JAN 2018- ddAC -T x12.   # MRI liver- hemagiomas  # Genetic counseling- DICER-1 VUS**  # TAH & BSO [Dr.Hall; [facility]; 3559]; smoking     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, estrogen receptor positive ([employer])   10/26/2016 Initial Diagnosis    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, estrogen receptor positive ([place])       HISTORY OF PRESENTING ILLNESS:  Shelby Barry 51 y.o.  female above history of ER/PR positive HER-2/neu negative stage II breast cancer- status post cycle 4 cycles of  Adriamycin and Cytoxan; Currently on weekly Taxol is here for follow-up. Patient is status post 2 Taxol treatments.  Patient complains of worsening cough for the last 4 days. Greenish phlegm. No hemoptysis. Complains of shortness of breath with exertion. Positive for runny nose. Sore throat. She has been taking over-the-counter antitussive medication.  No nausea no vomiting.She denies fevers or chills.  Denies any tingling or numbness Complains of fatigue. She feels stressed /depressed because of social issues. She unfortunately continues to smoke.   ROS: A complete 10 point review of system is done which is negative except mentioned above in history of present illness  MEDICAL HISTORY:  Past Medical History:  Diagnosis Date  . Anemia   . Anxiety   . Bursitis of right shoulder   . Cancer [facility])    breast  . 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, estrogen receptor positive ([place])   . Cataract,  bilateral   . Chemotherapy induced nausea and vomiting   . Cold    states she has had it for 2 months  . Depression   . GERD (gastroesophageal reflux disease)    uses baking soda for symptoms  . Insomnia   . Lumbago   . Vitamin D deficiency     SURGICAL HISTORY: Past Surgical History:  Procedure Laterality Date  . ABDOMINAL HYSTERECTOMY  04/20/2014  . BILATERAL SALPINGECTOMY    . BREAST LUMPECTOMY WITH SENTINEL LYMPH NODE BIOPSY Right 10/12/2016   Procedure: BREAST LUMPECTOMY WITH SENTINEL LYMPH NODE BX;  Surgeon: Christene Lye, MD;  Location: [facility];  Service: General;  Laterality: Right;  . COLONOSCOPY WITH PROPOFOL N/A 05/20/2016   Procedure: COLONOSCOPY WITH PROPOFOL;  Surgeon: Lucilla Lame, MD;  Location: [place];  Service: Endoscopy;  Laterality: N/A;  . ECTOPIC PREGNANCY SURGERY    . ENDOMETRIAL ABLATION    . PORTACATH PLACEMENT Left 11/04/2016   Procedure: INSERTION PORT-A-CATH;  Surgeon: Christene Lye, MD;  Location: [facility];  Service: General;  Laterality: Left;  Left subclavian vein  . TUBAL LIGATION      SOCIAL HISTORY: [employer]; in [place]; 1 pack/day; occassional alochol.  2 grown- children- 71 boy & 47 daughter Social History   Social History  . Marital status: Divorced    Spouse name: N/A  . Number of children: N/A  . Years of education: N/A   Occupational History  . Not on file.   Social History Main Topics  . Smoking  status: Former Smoker    Packs/day: 0.50    Years: 20.00    Types: Cigarettes    Quit date: 08/23/2016  . Smokeless tobacco: Never Used  . Alcohol use 0.0 oz/week     Comment: occassional  . Drug use: No  . Sexual activity: Yes    Partners: Male   Other Topics Concern  . Not on file   Social History Narrative   She is living with [name] ( boyfriend ) for the past two years, and his 64 yo daughter just moved in with them Fall 2017. Her mother is doing drugs.    Works as [employer]    FAMILY HISTORY:  2 sisters- one sister breast ca at ? 90y; other sister- ? Cancer; aunt/mom's sister- stomach cancer [60s].  Family History  Problem Relation Age of Onset  . Depression Father   . Obesity Father   . Alcohol abuse Brother   . Seizures Brother   . Breast cancer Sister 62    ALLERGIES:  is allergic to adhesive [tape] and nickel.  MEDICATIONS:  Current Outpatient Prescriptions  Medication Sig Dispense Refill  . acetaminophen (TYLENOL) 325 MG tablet Take 325 mg by mouth every 6 (six) hours as needed for fever.    . Bismuth Subsalicylate (PEPTO-BISMOL PO) Take 1 tablet by mouth daily as needed (indigestion).    . fluticasone furoate-vilanterol (BREO ELLIPTA) 100-25 MCG/INH AEPB Inhale 1 puff into the lungs daily. 60 each 0  . lidocaine-prilocaine (EMLA) cream APPLY TOPICALLY AS NEEDED.  APPLY GENEROUSLY OVER THE  MEDIPORT 45 MINUTES PRIOR  TO CHEMOTHERAPY. 30 g 1  . LORazepam (ATIVAN) 0.5 MG tablet Take 1 tablet (0.5 mg total) by mouth every 6 (six) hours as needed for anxiety (anticipatory nausea). 30 tablet 3  . magic mouthwash w/lidocaine SOLN Take 10 mLs by mouth 4 (four) times daily as needed for mouth pain. 480 mL 3  . Multiple Vitamin (MULTIVITAMIN WITH MINERALS) TABS tablet Take 1 tablet by mouth daily. One-A-Day    . ondansetron (ZOFRAN) 8 MG tablet TAKE 1 TABLET BY MOUTH  EVERY 8 HOURS AS NEEDED FOR NAUSEA OR VOMITING. START 3 DAYS AFTER CHEMO 80 tablet 0  . prochlorperazine (COMPAZINE) 10 MG tablet Take 1 tablet (10 mg total) by mouth every 6 (six) hours as needed for nausea or vomiting. 40 tablet 1  . zolpidem (AMBIEN) 10 MG tablet Take 0.5-1 tablets (5-10 mg total) by mouth at bedtime as needed for sleep. 90 tablet 3  . levofloxacin (LEVAQUIN) 500 MG tablet Take 1 tablet (500 mg total) by mouth daily. 5 tablet 0  . predniSONE (DELTASONE) 20 MG tablet 3 pills once a day x 3 days; 2 pills once a day x2 days; 1pill once a day x2 days; and STOP. Take with food in AM. 15 tablet 0   No  current facility-administered medications for this visit.       Marland Kitchen  PHYSICAL EXAMINATION: ECOG PERFORMANCE STATUS: 0 - Asymptomatic  Vitals:   02/01/17 1403  BP: 128/80  Pulse: 93  Resp: 18  Temp: 97.8 F (36.6 C)   Filed Weights   02/01/17 1409  Weight: 184 lb 1.4 oz (83.5 kg)    GENERAL: Well-nourished well-developed; Alert, no distress and comfortable.   With family.  EYES: no pallor or icterus OROPHARYNX: no thrush or ulceration; good dentition  NECK: supple, no masses felt LYMPH:  no palpable lymphadenopathy in the cervical, axillary or inguinal regions LUNGS: clear to auscultation and  No wheeze or crackles HEART/CVS: regular rate & rhythm and no murmurs; No lower extremity edema ABDOMEN: abdomen soft, non-tender and normal bowel sounds Musculoskeletal:no cyanosis of digits and no clubbing  PSYCH: alert & oriented x 3 with fluent speech NEURO: no focal motor/sensory deficits SKIN:  no rashes or significant lesions  LABORATORY DATA:  I have reviewed the data as listed Lab Results  Component Value Date   WBC 5.7 02/01/2017   HGB 12.7 02/01/2017   HCT 37.5 02/01/2017   MCV 89.4 02/01/2017   PLT 278 02/01/2017    Recent Labs  12/20/16 1040 01/03/17 1026 01/17/17 1105  NA 143 141 141  K 3.9 4.5 4.2  CL 108 107 105  CO2 _0 GLUCOSE 86 97 91  BUN _1 CREATININE 0.67 0.63 0.64  CALCIUM 8.7* 9.1 8.9  GFRNONAA >60 >60 >60  GFRAA >60 >60 >60  PROT 6.3* 6.6 6.5  ALBUMIN 3.8 4.1 3.8  AST 20 14* 15  ALT _2 ALKPHOS 97 89 87  BILITOT 0.1* 0.1* 0.1*    RADIOGRAPHIC STUDIES: I have personally reviewed the radiological images as listed and agreed with the findings in the report. No results found.  ASSESSMENT & PLAN:   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, estrogen receptor positive ([place]) # Breast cancer-stage II ER/PR positive HER-2/neu negative. PT1cpN1sn. Currently on Adjuvant chemotherapy with  Adriamycin-Cytoxan- Taxol w x12. Pt on Taxol weekly; cycle #2.  # Hold Taxol chemotherapy for tomorrow [facility] discussion below].  # Cough or shortness of breath/wheezing- acute bronchitis recommend Levaquin ; prednisone ; chest x-ray today.     # MD/chemo/ no labs in 1 week.    Cammie Sickle, MD 02/01/2017 2:51 PM

## 2017-02-01 NOTE — Telephone Encounter (Signed)
Left msg for patient to return my phone call.  Per Dr. Jacinto Reap, md would like rn to inform pt that CXR suggestive of bronchitis; no evidence of pneumonia- . Will keep f/u plan as discussed with pt.

## 2017-02-01 NOTE — Progress Notes (Signed)
Patient states that she would like to take Garcinia cambogia -3 capsule twice daily. Over the counter supplement to help her loose weight. RN Spoke with Dr. Consuello Masse advised against starting this supplement as this supplement increase diuretic activity.  Pt made aware.  Pt voices c/o "coughing up green sputum and a hacking cough that keeps me awake at bedtime. Oxygen sats at 96% room air. Chemotherapy will be held tomorrow.   Patient instructed at discharge that md sent rx [facility] her [facility] for Levaquin and oral prednisone dose pk. Pt instructed that she needs a cxr today. Pt became very tearful and voiced concerns over 'missing her next chemotherapy treatment tomorrow." active listening provided to patient.

## 2017-02-01 NOTE — Assessment & Plan Note (Addendum)
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 PT1cpN1sn. Currently on Adjuvant chemotherapy with Adriamycin-Cytoxan- Taxol w x12. Pt on Taxol weekly; cycle #2.  # Hold Taxol chemotherapy for tomorrow [facility] discussion below].  # Cough or shortness of breath/wheezing- acute bronchitis recommend Levaquin ; prednisone ; chest x-ray today.     # MD/chemo/ no labs in 1 week.

## 2017-02-02 ENCOUNTER — Ambulatory Visit: Payer: 59

## 2017-02-08 ENCOUNTER — Inpatient Hospital Stay (HOSPITAL_BASED_OUTPATIENT_CLINIC_OR_DEPARTMENT_OTHER): Payer: 59 | Admitting: Internal Medicine

## 2017-02-08 VITALS — BP 110/75 | HR 86 | Temp 98.7°F | Wt 191.0 lb

## 2017-02-08 DIAGNOSIS — F17210 Nicotine dependence, cigarettes, uncomplicated: Secondary | ICD-10-CM | POA: Diagnosis not present

## 2017-02-08 DIAGNOSIS — J209 Acute bronchitis, unspecified: Secondary | ICD-10-CM | POA: Diagnosis not present

## 2017-02-08 DIAGNOSIS — Z79899 Other long term (current) drug therapy: Secondary | ICD-10-CM

## 2017-02-08 DIAGNOSIS — Z170 Estrogen receptor positive status [ER+]: Secondary | ICD-10-CM

## 2017-02-08 DIAGNOSIS — C50511 Malignant neoplasm of lower-outer quadrant of right female breast: Secondary | ICD-10-CM | POA: Diagnosis not present

## 2017-02-08 MED ORDER — SODIUM CHLORIDE 0.9 % IV SOLN
80.0000 mg/m2 | Freq: Once | INTRAVENOUS | Status: AC
  Administered 2017-02-09: 162 mg via INTRAVENOUS
  Filled 2017-02-08: qty 27

## 2017-02-08 MED ORDER — SODIUM CHLORIDE 0.9 % IV SOLN
20.0000 mg | Freq: Once | INTRAVENOUS | Status: AC
  Administered 2017-02-09: 20 mg via INTRAVENOUS
  Filled 2017-02-08: qty 2

## 2017-02-08 NOTE — Assessment & Plan Note (Addendum)
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 PT1cpN1sn. Currently on Adjuvant chemotherapy with Adriamycin-Cytoxan- Taxol w x12. Pt on Taxol weekly; cycle #2.  # Proceed with cycle #3 tomorrow; labs from last week normal; acceptable for chemotherapy.  # Cough or shortness of breath/wheezing-s/p CXR- No acute process; Acute bronchitis s/p  Prednisone- improved.     # Counseled regarding quitting smoking patient is cutting down. Patient is going to start nicotine patches.  # Follow-up M.D. labs chemotherapy in 2 weeks; lab chemotherapy 1 week.

## 2017-02-08 NOTE — Progress Notes (Signed)
While he was in [place] NOTE  Patient Care Team: Steele Sizer, MD as PCP - General (Family Medicine) Seeplaputhur Robinette Haines, MD (General Surgery) Steele Sizer, MD as Attending Physician (Family Medicine)  CHIEF COMPLAINTS/PURPOSE OF CONSULTATION:  Breast cancer  #  Oncology History   # DEC 2017- RIGHT BREAST Stage II [pT1cpN1sn; screening] ER/PR- Pos- 90%; her 2 NEU- NEG s/p Lumpect & SLNBx; Dr.Sankar ]; JAN 2018- ddAC -T x12.   # MRI liver- hemagiomas  # Genetic counseling- DICER-1 VUS**  # TAH & BSO [Dr.Hall; [facility]; [id number]]; smoking     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, estrogen receptor positive ([place])   10/26/2016 Initial Diagnosis    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, estrogen receptor positive ([place])       HISTORY OF PRESENTING ILLNESS:  Shelby Barry 51 y.o.  female above history of ER/PR positive HER-2/neu negative stage II breast cancer- status post cycle 4 cycles of  Adriamycin and Cytoxan; Currently on weekly Taxol is here for follow-up. Patient is status post 2 Taxol treatments.  Chemotherapy was held last week because of "acute bronchitis'. Status post antibiotics prednisone her symptoms have resolved.  No nausea no vomiting.She denies fevers or chills.  Denies any tingling or numbness Complains of fatigue-improving. She stated that she is trying to quit smoking. She has received nicotine patches through her primary care doctor.  ROS: A complete 10 point review of system is done which is negative except mentioned above in history of present illness  MEDICAL HISTORY:  Past Medical History:  Diagnosis Date  . Anemia   . Anxiety   . Bursitis of right shoulder   . Cancer [facility])    breast  . 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, estrogen receptor positive ([place])   . Cataract, bilateral   . Chemotherapy induced nausea and vomiting   . Cold    states she  has had it for 2 months  . Depression   . GERD (gastroesophageal reflux disease)    uses baking soda for symptoms  . Insomnia   . Lumbago   . Vitamin D deficiency     SURGICAL HISTORY: Past Surgical History:  Procedure Laterality Date  . ABDOMINAL HYSTERECTOMY  04/20/2014  . BILATERAL SALPINGECTOMY    . BREAST LUMPECTOMY WITH SENTINEL LYMPH NODE BIOPSY Right 10/12/2016   Procedure: BREAST LUMPECTOMY WITH SENTINEL LYMPH NODE BX;  Surgeon: Christene Lye, MD;  Location: [facility];  Service: General;  Laterality: Right;  . COLONOSCOPY WITH PROPOFOL N/A 05/20/2016   Procedure: COLONOSCOPY WITH PROPOFOL;  Surgeon: Lucilla Lame, MD;  Location: [place];  Service: Endoscopy;  Laterality: N/A;  . ECTOPIC PREGNANCY SURGERY    . ENDOMETRIAL ABLATION    . PORTACATH PLACEMENT Left 11/04/2016   Procedure: INSERTION PORT-A-CATH;  Surgeon: Christene Lye, MD;  Location: [facility];  Service: General;  Laterality: Left;  Left subclavian vein  . TUBAL LIGATION      SOCIAL HISTORY: [employer]; in [place]; 1 pack/day; occassional alochol.  2 grown- children- 11 boy & 30 daughter Social History   Social History  . Marital status: Divorced    Spouse name: N/A  . Number of children: N/A  . Years of education: N/A   Occupational History  . Not on file.   Social History Main Topics  . Smoking status: Former Smoker    Packs/day: 0.50    Years:  20.00    Types: Cigarettes    Quit date: 08/23/2016  . Smokeless tobacco: Never Used  . Alcohol use 0.0 oz/week     Comment: occassional  . Drug use: No  . Sexual activity: Yes    Partners: Male   Other Topics Concern  . Not on file   Social History Narrative   She is living with [name] ( boyfriend ) for the past two years, and his 90 yo daughter just moved in with them Fall 2017. Her mother is doing drugs.    Works as [employer]    FAMILY HISTORY: 2 sisters- one sister breast ca at ? 14y; other sister- ? Cancer; aunt/mom's  sister- stomach cancer [60s].  Family History  Problem Relation Age of Onset  . Depression Father   . Obesity Father   . Alcohol abuse Brother   . Seizures Brother   . Breast cancer Sister 70    ALLERGIES:  is allergic to adhesive [tape] and nickel.  MEDICATIONS:  Current Outpatient Prescriptions  Medication Sig Dispense Refill  . acetaminophen (TYLENOL) 325 MG tablet Take 325 mg by mouth every 6 (six) hours as needed for fever.    . Bismuth Subsalicylate (PEPTO-BISMOL PO) Take 1 tablet by mouth daily as needed (indigestion).    . fluticasone furoate-vilanterol (BREO ELLIPTA) 100-25 MCG/INH AEPB Inhale 1 puff into the lungs daily. 60 each 0  . lidocaine-prilocaine (EMLA) cream APPLY TOPICALLY AS NEEDED.  APPLY GENEROUSLY OVER THE  MEDIPORT 45 MINUTES PRIOR  TO CHEMOTHERAPY. 30 g 1  . LORazepam (ATIVAN) 0.5 MG tablet Take 1 tablet (0.5 mg total) by mouth every 6 (six) hours as needed for anxiety (anticipatory nausea). 30 tablet 3  . magic mouthwash w/lidocaine SOLN Take 10 mLs by mouth 4 (four) times daily as needed for mouth pain. 480 mL 3  . Multiple Vitamin (MULTIVITAMIN WITH MINERALS) TABS tablet Take 1 tablet by mouth daily. One-A-Day    . ondansetron (ZOFRAN) 8 MG tablet TAKE 1 TABLET BY MOUTH  EVERY 8 HOURS AS NEEDED FOR NAUSEA OR VOMITING. START 3 DAYS AFTER CHEMO 80 tablet 0  . prochlorperazine (COMPAZINE) 10 MG tablet Take 1 tablet (10 mg total) by mouth every 6 (six) hours as needed for nausea or vomiting. 40 tablet 1  . zolpidem (AMBIEN) 10 MG tablet Take 0.5-1 tablets (5-10 mg total) by mouth at bedtime as needed for sleep. 90 tablet 3   No current facility-administered medications for this visit.       Marland Kitchen  PHYSICAL EXAMINATION: ECOG PERFORMANCE STATUS: 0 - Asymptomatic  Vitals:   02/08/17 1407  BP: 110/75  Pulse: 86  Temp: 98.7 F (37.1 C)   Filed Weights   02/08/17 1407  Weight: 191 lb 0.5 oz (86.7 kg)    GENERAL: Well-nourished well-developed; Alert, no  distress and comfortable. Alone.   EYES: no pallor or icterus OROPHARYNX: no thrush or ulceration; good dentition  NECK: supple, no masses felt LYMPH:  no palpable lymphadenopathy in the cervical, axillary or inguinal regions LUNGS: clear to auscultation and  No wheeze or crackles HEART/CVS: regular rate & rhythm and no murmurs; No lower extremity edema ABDOMEN: abdomen soft, non-tender and normal bowel sounds Musculoskeletal:no cyanosis of digits and no clubbing  PSYCH: alert & oriented x 3 with fluent speech NEURO: no focal motor/sensory deficits SKIN:  no rashes or significant lesions  LABORATORY DATA:  I have reviewed the data as listed Lab Results  Component Value Date   WBC 5.7  02/01/2017   HGB 12.7 02/01/2017   HCT 37.5 02/01/2017   MCV 89.4 02/01/2017   PLT 278 02/01/2017    Recent Labs  01/03/17 1026 01/17/17 1105 02/01/17 1349  NA 141 141 140  K 4.5 4.2 4.3  CL 107 105 103  CO2 _0 GLUCOSE 97 91 96  BUN _1 CREATININE 0.63 0.64 0.54  CALCIUM 9.1 8.9 9.1  GFRNONAA >60 >60 >60  GFRAA >60 >60 >60  PROT 6.6 6.5 7.1  ALBUMIN 4.1 3.8 3.8  AST 14* 15 21  ALT 16 16 34  ALKPHOS 89 87 88  BILITOT 0.1* 0.1* <0.1*    RADIOGRAPHIC STUDIES: I have personally reviewed the radiological images as listed and agreed with the findings in the report. Dg Chest 2 View  Result Date: 02/01/2017 CLINICAL DATA:  Productive cough for 4 day knees, currently on chemotherapy, history of breast carcinoma EXAM: CHEST  2 VIEW COMPARISON:  Chest x-ray of 11/04/2016 FINDINGS: No active infiltrate or effusion is seen. There is some peribronchial thickening which may indicate bronchitis. A left-sided Port-A-Cath remains with the tip in the region of the right atrium. The heart is within normal limits in size. No bony abnormality is seen. IMPRESSION: 1. No pneumonia or effusion.  Question bronchitis. 2. Left-sided Port-A-Cath tip in the region of the right atrium. Electronically  Signed   By: Ivar Drape M.D.   On: 02/01/2017 15:52    ASSESSMENT & PLAN:   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, estrogen receptor positive ([place]) # Breast cancer-stage II ER/PR positive HER-2/neu negative. PT1cpN1sn. Currently on Adjuvant chemotherapy with Adriamycin-Cytoxan- Taxol w x12. Pt on Taxol weekly; cycle #2.  # Proceed with cycle #3 tomorrow; labs from last week normal; acceptable for chemotherapy.  # Cough or shortness of breath/wheezing-s/p CXR- No acute process; Acute bronchitis s/p  Prednisone- improved.     # Counseled regarding quitting smoking patient is cutting down. Patient is going to start nicotine patches.  # Follow-up M.D. labs chemotherapy in 2 weeks; lab chemotherapy 1 week.     Cammie Sickle, MD 02/08/2017 2:15 PM

## 2017-02-08 NOTE — Progress Notes (Signed)
Patient here today for follow up.   

## 2017-02-09 ENCOUNTER — Inpatient Hospital Stay: Payer: 59

## 2017-02-09 VITALS — BP 124/80 | HR 88 | Temp 98.0°F | Resp 20

## 2017-02-09 DIAGNOSIS — Z170 Estrogen receptor positive status [ER+]: Principal | ICD-10-CM

## 2017-02-09 DIAGNOSIS — C50511 Malignant neoplasm of lower-outer quadrant of right female breast: Secondary | ICD-10-CM

## 2017-02-09 MED ORDER — SODIUM CHLORIDE 0.9% FLUSH
10.0000 mL | INTRAVENOUS | Status: DC | PRN
  Administered 2017-02-09: 10 mL
  Filled 2017-02-09: qty 10

## 2017-02-09 MED ORDER — SODIUM CHLORIDE 0.9 % IV SOLN
Freq: Once | INTRAVENOUS | Status: AC
  Administered 2017-02-09: 09:00:00 via INTRAVENOUS
  Filled 2017-02-09: qty 1000

## 2017-02-09 MED ORDER — ACETAMINOPHEN 500 MG PO TABS
1000.0000 mg | ORAL_TABLET | Freq: Once | ORAL | Status: AC
  Administered 2017-02-09: 1000 mg via ORAL
  Filled 2017-02-09: qty 2

## 2017-02-09 MED ORDER — HEPARIN SOD (PORK) LOCK FLUSH 100 UNIT/ML IV SOLN
500.0000 [IU] | Freq: Once | INTRAVENOUS | Status: AC | PRN
  Administered 2017-02-09: 500 [IU]
  Filled 2017-02-09: qty 5

## 2017-02-09 MED ORDER — FAMOTIDINE IN NACL 20-0.9 MG/50ML-% IV SOLN
20.0000 mg | Freq: Once | INTRAVENOUS | Status: AC
  Administered 2017-02-09: 20 mg via INTRAVENOUS
  Filled 2017-02-09: qty 50

## 2017-02-09 MED ORDER — DIPHENHYDRAMINE HCL 50 MG/ML IJ SOLN
25.0000 mg | Freq: Once | INTRAMUSCULAR | Status: AC
  Administered 2017-02-09: 25 mg via INTRAVENOUS
  Filled 2017-02-09: qty 1

## 2017-02-09 NOTE — Progress Notes (Signed)
Patient has a headache this morning on arrival to infusion suite. Rates it a 4/10. Call placed to MD acetaminophen 1000mg  ordered and given to patient.

## 2017-02-15 ENCOUNTER — Inpatient Hospital Stay: Payer: 59

## 2017-02-15 DIAGNOSIS — C50511 Malignant neoplasm of lower-outer quadrant of right female breast: Secondary | ICD-10-CM

## 2017-02-15 DIAGNOSIS — Z170 Estrogen receptor positive status [ER+]: Principal | ICD-10-CM

## 2017-02-15 LAB — CBC WITH DIFFERENTIAL/PLATELET
Basophils Absolute: 0.1 10*3/uL (ref 0–0.1)
Basophils Relative: 1 %
EOS PCT: 4 %
Eosinophils Absolute: 0.3 10*3/uL (ref 0–0.7)
HEMATOCRIT: 36.3 % (ref 35.0–47.0)
Hemoglobin: 12.4 g/dL (ref 12.0–16.0)
LYMPHS PCT: 17 %
Lymphs Abs: 1.5 10*3/uL (ref 1.0–3.6)
MCH: 30.7 pg (ref 26.0–34.0)
MCHC: 34 g/dL (ref 32.0–36.0)
MCV: 90.3 fL (ref 80.0–100.0)
MONO ABS: 0.5 10*3/uL (ref 0.2–0.9)
Monocytes Relative: 6 %
NEUTROS ABS: 6.1 10*3/uL (ref 1.4–6.5)
Neutrophils Relative %: 72 %
Platelets: 256 10*3/uL (ref 150–440)
RBC: 4.02 MIL/uL (ref 3.80–5.20)
RDW: 18.8 % — AB (ref 11.5–14.5)
WBC: 8.5 10*3/uL (ref 3.6–11.0)

## 2017-02-15 LAB — BASIC METABOLIC PANEL
ANION GAP: 3 — AB (ref 5–15)
BUN: 12 mg/dL (ref 6–20)
CALCIUM: 8.8 mg/dL — AB (ref 8.9–10.3)
CO2: 30 mmol/L (ref 22–32)
CREATININE: 0.58 mg/dL (ref 0.44–1.00)
Chloride: 105 mmol/L (ref 101–111)
GFR calc Af Amer: >60 mL/min (ref >60)
GLUCOSE: 101 mg/dL — AB (ref 65–99)
Potassium: 4.4 mmol/L (ref 3.5–5.1)
Sodium: 138 mmol/L (ref 135–145)

## 2017-02-15 MED ORDER — SODIUM CHLORIDE 0.9 % IV SOLN
20.0000 mg | Freq: Once | INTRAVENOUS | Status: AC
  Administered 2017-02-16: 20 mg via INTRAVENOUS
  Filled 2017-02-15: qty 2

## 2017-02-15 MED ORDER — SODIUM CHLORIDE 0.9 % IV SOLN
80.0000 mg/m2 | Freq: Once | INTRAVENOUS | Status: AC
  Administered 2017-02-16: 162 mg via INTRAVENOUS
  Filled 2017-02-15: qty 27

## 2017-02-16 ENCOUNTER — Inpatient Hospital Stay: Payer: 59

## 2017-02-16 VITALS — BP 128/80 | HR 86 | Temp 97.0°F | Resp 18

## 2017-02-16 DIAGNOSIS — C50511 Malignant neoplasm of lower-outer quadrant of right female breast: Secondary | ICD-10-CM

## 2017-02-16 DIAGNOSIS — Z170 Estrogen receptor positive status [ER+]: Principal | ICD-10-CM

## 2017-02-16 MED ORDER — FAMOTIDINE IN NACL 20-0.9 MG/50ML-% IV SOLN
20.0000 mg | Freq: Once | INTRAVENOUS | Status: AC
  Administered 2017-02-16: 20 mg via INTRAVENOUS
  Filled 2017-02-16: qty 50

## 2017-02-16 MED ORDER — DIPHENHYDRAMINE HCL 50 MG/ML IJ SOLN
25.0000 mg | Freq: Once | INTRAMUSCULAR | Status: AC
  Administered 2017-02-16: 25 mg via INTRAVENOUS
  Filled 2017-02-16: qty 1

## 2017-02-16 MED ORDER — SODIUM CHLORIDE 0.9% FLUSH
10.0000 mL | INTRAVENOUS | Status: DC | PRN
  Administered 2017-02-16: 10 mL
  Filled 2017-02-16: qty 10

## 2017-02-16 MED ORDER — SODIUM CHLORIDE 0.9 % IV SOLN
Freq: Once | INTRAVENOUS | Status: AC
  Administered 2017-02-16: 09:00:00 via INTRAVENOUS
  Filled 2017-02-16: qty 1000

## 2017-02-16 MED ORDER — HEPARIN SOD (PORK) LOCK FLUSH 100 UNIT/ML IV SOLN
500.0000 [IU] | Freq: Once | INTRAVENOUS | Status: DC | PRN
  Filled 2017-02-16: qty 5

## 2017-02-21 ENCOUNTER — Inpatient Hospital Stay: Payer: 59 | Attending: Internal Medicine

## 2017-02-21 ENCOUNTER — Other Ambulatory Visit: Payer: Self-pay | Admitting: Internal Medicine

## 2017-02-21 DIAGNOSIS — C50511 Malignant neoplasm of lower-outer quadrant of right female breast: Secondary | ICD-10-CM | POA: Insufficient documentation

## 2017-02-21 DIAGNOSIS — K219 Gastro-esophageal reflux disease without esophagitis: Secondary | ICD-10-CM | POA: Insufficient documentation

## 2017-02-21 DIAGNOSIS — G4700 Insomnia, unspecified: Secondary | ICD-10-CM | POA: Insufficient documentation

## 2017-02-21 DIAGNOSIS — Z87891 Personal history of nicotine dependence: Secondary | ICD-10-CM | POA: Diagnosis not present

## 2017-02-21 DIAGNOSIS — Z5111 Encounter for antineoplastic chemotherapy: Secondary | ICD-10-CM | POA: Diagnosis not present

## 2017-02-21 DIAGNOSIS — Z170 Estrogen receptor positive status [ER+]: Secondary | ICD-10-CM | POA: Diagnosis not present

## 2017-02-21 DIAGNOSIS — F419 Anxiety disorder, unspecified: Secondary | ICD-10-CM | POA: Insufficient documentation

## 2017-02-21 DIAGNOSIS — E559 Vitamin D deficiency, unspecified: Secondary | ICD-10-CM | POA: Diagnosis not present

## 2017-02-21 DIAGNOSIS — Z79899 Other long term (current) drug therapy: Secondary | ICD-10-CM | POA: Insufficient documentation

## 2017-02-21 LAB — COMPREHENSIVE METABOLIC PANEL
ALK PHOS: 89 U/L (ref 38–126)
ALT: 55 U/L — AB (ref 14–54)
AST: 30 U/L (ref 15–41)
Albumin: 3.9 g/dL (ref 3.5–5.0)
Anion gap: 5 (ref 5–15)
BUN: 13 mg/dL (ref 6–20)
CALCIUM: 8.9 mg/dL (ref 8.9–10.3)
CHLORIDE: 104 mmol/L (ref 101–111)
CO2: 28 mmol/L (ref 22–32)
CREATININE: 0.69 mg/dL (ref 0.44–1.00)
GFR calc non Af Amer: >60 mL/min (ref >60)
Glucose, Bld: 118 mg/dL — ABNORMAL HIGH (ref 65–99)
Potassium: 4.3 mmol/L (ref 3.5–5.1)
Sodium: 137 mmol/L (ref 135–145)
Total Bilirubin: 0.5 mg/dL (ref 0.3–1.2)
Total Protein: 6.5 g/dL (ref 6.5–8.1)

## 2017-02-21 LAB — CBC WITH DIFFERENTIAL/PLATELET
BASOS ABS: 0.1 10*3/uL (ref 0–0.1)
Basophils Relative: 1 %
EOS PCT: 4 %
Eosinophils Absolute: 0.3 10*3/uL (ref 0–0.7)
HCT: 36.7 % (ref 35.0–47.0)
HEMOGLOBIN: 12.3 g/dL (ref 12.0–16.0)
LYMPHS ABS: 1.1 10*3/uL (ref 1.0–3.6)
Lymphocytes Relative: 16 %
MCH: 30.4 pg (ref 26.0–34.0)
MCHC: 33.5 g/dL (ref 32.0–36.0)
MCV: 90.8 fL (ref 80.0–100.0)
Monocytes Absolute: 0.3 10*3/uL (ref 0.2–0.9)
Monocytes Relative: 4 %
NEUTROS PCT: 75 %
Neutro Abs: 5.4 10*3/uL (ref 1.4–6.5)
PLATELETS: 264 10*3/uL (ref 150–440)
RBC: 4.04 MIL/uL (ref 3.80–5.20)
RDW: 18 % — ABNORMAL HIGH (ref 11.5–14.5)
WBC: 7.1 10*3/uL (ref 3.6–11.0)

## 2017-02-22 ENCOUNTER — Inpatient Hospital Stay: Payer: 59

## 2017-02-22 ENCOUNTER — Inpatient Hospital Stay (HOSPITAL_BASED_OUTPATIENT_CLINIC_OR_DEPARTMENT_OTHER): Payer: 59 | Admitting: Internal Medicine

## 2017-02-22 ENCOUNTER — Ambulatory Visit: Payer: 59 | Admitting: Internal Medicine

## 2017-02-22 ENCOUNTER — Other Ambulatory Visit: Payer: 59

## 2017-02-22 VITALS — BP 121/87 | HR 93 | Temp 98.1°F | Resp 18 | Wt 190.4 lb

## 2017-02-22 VITALS — BP 120/72 | HR 85 | Temp 97.6°F | Resp 18

## 2017-02-22 DIAGNOSIS — Z170 Estrogen receptor positive status [ER+]: Secondary | ICD-10-CM

## 2017-02-22 DIAGNOSIS — C50511 Malignant neoplasm of lower-outer quadrant of right female breast: Secondary | ICD-10-CM

## 2017-02-22 DIAGNOSIS — Z79899 Other long term (current) drug therapy: Secondary | ICD-10-CM | POA: Diagnosis not present

## 2017-02-22 MED ORDER — CITALOPRAM HYDROBROMIDE 20 MG PO TABS: 20.0000 mg | ORAL_TABLET | Freq: Every day | ORAL | 3 refills | Status: DC

## 2017-02-22 MED ORDER — SODIUM CHLORIDE 0.9 % IV SOLN
20.0000 mg | Freq: Once | INTRAVENOUS | Status: AC
  Administered 2017-02-22: 20 mg via INTRAVENOUS
  Filled 2017-02-22: qty 2

## 2017-02-22 MED ORDER — HEPARIN SOD (PORK) LOCK FLUSH 100 UNIT/ML IV SOLN
500.0000 [IU] | Freq: Once | INTRAVENOUS | Status: AC | PRN
  Administered 2017-02-22: 500 [IU]
  Filled 2017-02-22: qty 5

## 2017-02-22 MED ORDER — SODIUM CHLORIDE 0.9 % IV SOLN
Freq: Once | INTRAVENOUS | Status: AC
  Administered 2017-02-22: 10:00:00 via INTRAVENOUS
  Filled 2017-02-22: qty 1000

## 2017-02-22 MED ORDER — OMEPRAZOLE 20 MG PO CPDR: 20.0000 mg | DELAYED_RELEASE_CAPSULE | Freq: Every day | ORAL | 3 refills | Status: DC

## 2017-02-22 MED ORDER — SODIUM CHLORIDE 0.9% FLUSH
10.0000 mL | INTRAVENOUS | Status: DC | PRN
  Administered 2017-02-22: 10 mL
  Filled 2017-02-22: qty 10

## 2017-02-22 MED ORDER — PACLITAXEL CHEMO INJECTION 300 MG/50ML
80.0000 mg/m2 | Freq: Once | INTRAVENOUS | Status: AC
  Administered 2017-02-22: 162 mg via INTRAVENOUS
  Filled 2017-02-22: qty 27

## 2017-02-22 MED ORDER — DIPHENHYDRAMINE HCL 50 MG/ML IJ SOLN
25.0000 mg | Freq: Once | INTRAMUSCULAR | Status: AC
  Administered 2017-02-22: 25 mg via INTRAVENOUS
  Filled 2017-02-22: qty 1

## 2017-02-22 MED ORDER — FAMOTIDINE IN NACL 20-0.9 MG/50ML-% IV SOLN
20.0000 mg | Freq: Once | INTRAVENOUS | Status: AC
  Administered 2017-02-22: 20 mg via INTRAVENOUS
  Filled 2017-02-22: qty 50

## 2017-02-22 NOTE — Progress Notes (Signed)
Here for follow up. Chemo today

## 2017-02-22 NOTE — Patient Instructions (Signed)
Paclitaxel injection What is this medicine? PACLITAXEL (PAK li TAX el) is a chemotherapy drug. It targets fast dividing cells, like cancer cells, and causes these cells to die. This medicine is used to treat ovarian cancer, breast cancer, and other cancers. This medicine may be used for other purposes; ask your health care provider or pharmacist if you have questions. COMMON BRAND NAME(S): Onxol, Taxol What should I tell my health care provider before I take this medicine? They need to know if you have any of these conditions: -blood disorders -irregular heartbeat -infection (especially a virus infection such as chickenpox, cold sores, or herpes) -liver disease -previous or ongoing radiation therapy -an unusual or allergic reaction to paclitaxel, alcohol, polyoxyethylated castor oil, other chemotherapy agents, other medicines, foods, dyes, or preservatives -pregnant or trying to get pregnant -breast-feeding How should I use this medicine? This drug is given as an infusion into a vein. It is administered in a hospital or clinic by a specially trained health care professional. Talk to your pediatrician regarding the use of this medicine in children. Special care may be needed. Overdosage: If you think you have taken too much of this medicine contact a poison control center or emergency room at once. NOTE: This medicine is only for you. Do not share this medicine with others. What if I miss a dose? It is important not to miss your dose. Call your doctor or health care professional if you are unable to keep an appointment. What may interact with this medicine? Do not take this medicine with any of the following medications: -disulfiram -metronidazole This medicine may also interact with the following medications: -cyclosporine -diazepam -ketoconazole -medicines to increase blood counts like filgrastim, pegfilgrastim, sargramostim -other chemotherapy drugs like cisplatin, doxorubicin,  epirubicin, etoposide, teniposide, vincristine -quinidine -testosterone -vaccines -verapamil Talk to your doctor or health care professional before taking any of these medicines: -acetaminophen -aspirin -ibuprofen -ketoprofen -naproxen This list may not describe all possible interactions. Give your health care provider a list of all the medicines, herbs, non-prescription drugs, or dietary supplements you use. Also tell them if you smoke, drink alcohol, or use illegal drugs. Some items may interact with your medicine. What should I watch for while using this medicine? Your condition will be monitored carefully while you are receiving this medicine. You will need important blood work done while you are taking this medicine. This medicine can cause serious allergic reactions. To reduce your risk you will need to take other medicine(s) before treatment with this medicine. If you experience allergic reactions like skin rash, itching or hives, swelling of the face, lips, or tongue, tell your doctor or health care professional right away. In some cases, you may be given additional medicines to help with side effects. Follow all directions for their use. This drug may make you feel generally unwell. This is not uncommon, as chemotherapy can affect healthy cells as well as cancer cells. Report any side effects. Continue your course of treatment even though you feel ill unless your doctor tells you to stop. Call your doctor or health care professional for advice if you get a fever, chills or sore throat, or other symptoms of a cold or flu. Do not treat yourself. This drug decreases your body's ability to fight infections. Try to avoid being around people who are sick. This medicine may increase your risk to bruise or bleed. Call your doctor or health care professional if you notice any unusual bleeding. Be careful brushing and flossing your teeth or   using a toothpick because you may get an infection or  bleed more easily. If you have any dental work done, tell your dentist you are receiving this medicine. Avoid taking products that contain aspirin, acetaminophen, ibuprofen, naproxen, or ketoprofen unless instructed by your doctor. These medicines may hide a fever. Do not become pregnant while taking this medicine. Women should inform their doctor if they wish to become pregnant or think they might be pregnant. There is a potential for serious side effects to an unborn child. Talk to your health care professional or pharmacist for more information. Do not breast-feed an infant while taking this medicine. Men are advised not to father a child while receiving this medicine. This product may contain alcohol. Ask your pharmacist or healthcare provider if this medicine contains alcohol. Be sure to tell all healthcare providers you are taking this medicine. Certain medicines, like metronidazole and disulfiram, can cause an unpleasant reaction when taken with alcohol. The reaction includes flushing, headache, nausea, vomiting, sweating, and increased thirst. The reaction can last from 30 minutes to several hours. What side effects may I notice from receiving this medicine? Side effects that you should report to your doctor or health care professional as soon as possible: -allergic reactions like skin rash, itching or hives, swelling of the face, lips, or tongue -low blood counts - This drug may decrease the number of white blood cells, red blood cells and platelets. You may be at increased risk for infections and bleeding. -signs of infection - fever or chills, cough, sore throat, pain or difficulty passing urine -signs of decreased platelets or bleeding - bruising, pinpoint red spots on the skin, black, tarry stools, nosebleeds -signs of decreased red blood cells - unusually weak or tired, fainting spells, lightheadedness -breathing problems -chest pain -high or low blood pressure -mouth sores -nausea and  vomiting -pain, swelling, redness or irritation at the injection site -pain, tingling, numbness in the hands or feet -slow or irregular heartbeat -swelling of the ankle, feet, hands Side effects that usually do not require medical attention (report to your doctor or health care professional if they continue or are bothersome): -bone pain -complete hair loss including hair on your head, underarms, pubic hair, eyebrows, and eyelashes -changes in the color of fingernails -diarrhea -loosening of the fingernails -loss of appetite -muscle or joint pain -red flush to skin -sweating This list may not describe all possible side effects. Call your doctor for medical advice about side effects. You may report side effects to FDA at 1-800-FDA-1088. Where should I keep my medicine? This drug is given in a hospital or clinic and will not be stored at home. NOTE: This sheet is a summary. It may not cover all possible information. If you have questions about this medicine, talk to your doctor, pharmacist, or health care provider.  2018 Elsevier/Gold Standard (2015-09-09 19:58:00)  

## 2017-02-22 NOTE — Progress Notes (Signed)
While he was in [place] NOTE  Patient Care Team: Steele Sizer, MD as PCP - General (Family Medicine) Seeplaputhur Robinette Haines, MD (General Surgery) Steele Sizer, MD as Attending Physician (Family Medicine)  CHIEF COMPLAINTS/PURPOSE OF CONSULTATION:  Breast cancer  #  Oncology History   # DEC 2017- RIGHT BREAST Stage II [pT1cpN1sn; screening] ER/PR- Pos- 90%; her 2 NEU- NEG s/p Lumpect & SLNBx; Dr.Sankar ]; JAN 2018- ddAC -T x12.   # MRI liver- hemagiomas  # Genetic counseling- DICER-1 VUS**  # TAH & BSO [Dr.Hall; [facility]; [id number]]; smoking     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, estrogen receptor positive ([place])   10/26/2016 Initial Diagnosis    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, estrogen receptor positive ([place])       HISTORY OF PRESENTING ILLNESS:  Shelby Barry 51 y.o.  female above history of ER/PR positive HER-2/neu negative stage II breast cancer- status post cycle 4 cycles of  Adriamycin and Cytoxan; Currently on weekly Taxol is here for follow-up. Patient is status post 4 Taxol treatments.  Patient's bleeding is improved. Cough is improved. She has quit smoking.  However she complains of anxiety spells. Intermittent pills or depression. Previously on Zoloft in the past. Currently not on any antidepressants. Having difficulty sleeping.  No nausea no vomiting.She denies fevers or chills.  Denies any tingling or numbness Complains of fatigue-improving.   ROS: A complete 10 point review of system is done which is negative except mentioned above in history of present illness  MEDICAL HISTORY:  Past Medical History:  Diagnosis Date  . Anemia   . Anxiety   . Bursitis of right shoulder   . Cancer [facility])    breast  . 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, estrogen receptor positive ([place])   . Cataract, bilateral   . Chemotherapy induced nausea and vomiting   . Cold    states she has had it for 2 months  . Depression   . GERD (gastroesophageal reflux disease)    uses baking soda for symptoms  . Insomnia   . Lumbago   . Vitamin D deficiency     SURGICAL HISTORY: Past Surgical History:  Procedure Laterality Date  . ABDOMINAL HYSTERECTOMY  04/20/2014  . BILATERAL SALPINGECTOMY    . BREAST LUMPECTOMY WITH SENTINEL LYMPH NODE BIOPSY Right 10/12/2016   Procedure: BREAST LUMPECTOMY WITH SENTINEL LYMPH NODE BX;  Surgeon: Christene Lye, MD;  Location: [facility];  Service: General;  Laterality: Right;  . COLONOSCOPY WITH PROPOFOL N/A 05/20/2016   Procedure: COLONOSCOPY WITH PROPOFOL;  Surgeon: Lucilla Lame, MD;  Location: [place];  Service: Endoscopy;  Laterality: N/A;  . ECTOPIC PREGNANCY SURGERY    . ENDOMETRIAL ABLATION    . PORTACATH PLACEMENT Left 11/04/2016   Procedure: INSERTION PORT-A-CATH;  Surgeon: Christene Lye, MD;  Location: [facility];  Service: General;  Laterality: Left;  Left subclavian vein  . TUBAL LIGATION      SOCIAL HISTORY: [employer]; in [place]; 1 pack/day; occassional alochol.  2 grown- children- 36 boy & 33 daughter Social History   Social History  . Marital status: Divorced    Spouse name: N/A  . Number of children: N/A  . Years of education: N/A   Occupational History  . Not on file.   Social History Main Topics  . Smoking status: Former Smoker    Packs/day: 0.50    Years:  20.00    Types: Cigarettes    Quit date: 08/23/2016  . Smokeless tobacco: Never Used  . Alcohol use 0.0 oz/week     Comment: occassional  . Drug use: No  . Sexual activity: Yes    Partners: Male   Other Topics Concern  . Not on file   Social History Narrative   She is living with [name] ( boyfriend ) for the past two years, and his 44 yo daughter just moved in with them Fall 2017. Her mother is doing drugs.    Works as [employer]    FAMILY HISTORY: 2 sisters- one sister breast ca at ? 75y; other sister- ? Cancer;  aunt/mom's sister- stomach cancer [60s].  Family History  Problem Relation Age of Onset  . Depression Father   . Obesity Father   . Alcohol abuse Brother   . Seizures Brother   . Breast cancer Sister 55    ALLERGIES:  is allergic to adhesive [tape] and nickel.  MEDICATIONS:  Current Outpatient Prescriptions  Medication Sig Dispense Refill  . acetaminophen (TYLENOL) 325 MG tablet Take 325 mg by mouth every 6 (six) hours as needed for fever.    . Bismuth Subsalicylate (PEPTO-BISMOL PO) Take 1 tablet by mouth daily as needed (indigestion).    . fluticasone furoate-vilanterol (BREO ELLIPTA) 100-25 MCG/INH AEPB Inhale 1 puff into the lungs daily. 60 each 0  . lidocaine-prilocaine (EMLA) cream APPLY TOPICALLY AS NEEDED.  APPLY GENEROUSLY OVER THE  MEDIPORT 45 MINUTES PRIOR  TO CHEMOTHERAPY. 30 g 1  . LORazepam (ATIVAN) 0.5 MG tablet Take 1 tablet (0.5 mg total) by mouth every 6 (six) hours as needed for anxiety (anticipatory nausea). 30 tablet 3  . magic mouthwash w/lidocaine SOLN Take 10 mLs by mouth 4 (four) times daily as needed for mouth pain. 480 mL 3  . Multiple Vitamin (MULTIVITAMIN WITH MINERALS) TABS tablet Take 1 tablet by mouth daily. One-A-Day    . ondansetron (ZOFRAN) 8 MG tablet TAKE 1 TABLET BY MOUTH  EVERY 8 HOURS AS NEEDED FOR NAUSEA OR VOMITING. START 3 DAYS AFTER CHEMO 80 tablet 0  . prochlorperazine (COMPAZINE) 10 MG tablet Take 1 tablet (10 mg total) by mouth every 6 (six) hours as needed for nausea or vomiting. 40 tablet 1  . zolpidem (AMBIEN) 10 MG tablet Take 10 mg by mouth at bedtime as needed for sleep.    . citalopram (CELEXA) 20 MG tablet Take 1 tablet (20 mg total) by mouth daily. 30 tablet 3  . omeprazole (PRILOSEC) 20 MG capsule Take 1 capsule (20 mg total) by mouth daily. 60 capsule 3  . zolpidem (AMBIEN) 10 MG tablet Take 0.5-1 tablets (5-10 mg total) by mouth at bedtime as needed for sleep. 90 tablet 3   No current facility-administered medications for this  visit.    Facility-Administered Medications Ordered in Other Visits  Medication Dose Route Frequency Provider Last Rate Last Dose  . sodium chloride flush (NS) 0.9 % injection 10 mL  10 mL Intracatheter PRN Cammie Sickle, MD   10 mL at 02/22/17 0953      .  PHYSICAL EXAMINATION: ECOG PERFORMANCE STATUS: 0 - Asymptomatic  Vitals:   02/22/17 1014  BP: 121/87  Pulse: 93  Resp: 18  Temp: 98.1 F (36.7 C)   Filed Weights   02/22/17 1014  Weight: 190 lb 5.9 oz (86.4 kg)    GENERAL: Well-nourished well-developed; Alert, no distress and comfortable. Alone.   EYES: no pallor or  icterus OROPHARYNX: no thrush or ulceration; good dentition  NECK: supple, no masses felt LYMPH:  no palpable lymphadenopathy in the cervical, axillary or inguinal regions LUNGS: clear to auscultation and  No wheeze or crackles HEART/CVS: regular rate & rhythm and no murmurs; No lower extremity edema ABDOMEN: abdomen soft, non-tender and normal bowel sounds Musculoskeletal:no cyanosis of digits and no clubbing  PSYCH: alert & oriented x 3 with fluent speech NEURO: no focal motor/sensory deficits SKIN:  no rashes or significant lesions  LABORATORY DATA:  I have reviewed the data as listed Lab Results  Component Value Date   WBC 7.1 02/21/2017   HGB 12.3 02/21/2017   HCT 36.7 02/21/2017   MCV 90.8 02/21/2017   PLT 264 02/21/2017    Recent Labs  01/17/17 1105 02/01/17 1349 02/15/17 1411 02/21/17 1047  NA 141 140 138 137  K 4.2 4.3 4.4 4.3  CL 105 103 105 104  CO2 _0 GLUCOSE 91 96 101* 118*  BUN _1 CREATININE 0.64 0.54 0.58 0.69  CALCIUM 8.9 9.1 8.8* 8.9  GFRNONAA >60 >60 >60 >60  GFRAA >60 >60 >60 >60  PROT 6.5 7.1  --  6.5  ALBUMIN 3.8 3.8  --  3.9  AST 15 21  --  30  ALT 16 34  --  55*  ALKPHOS 87 88  --  89  BILITOT 0.1* <0.1*  --  0.5    RADIOGRAPHIC STUDIES: I have personally reviewed the radiological images as listed and agreed with the findings in  the report. Dg Chest 2 View  Result Date: 02/01/2017 CLINICAL DATA:  Productive cough for 4 day knees, currently on chemotherapy, history of breast carcinoma EXAM: CHEST  2 VIEW COMPARISON:  Chest x-ray of 11/04/2016 FINDINGS: No active infiltrate or effusion is seen. There is some peribronchial thickening which may indicate bronchitis. A left-sided Port-A-Cath remains with the tip in the region of the right atrium. The heart is within normal limits in size. No bony abnormality is seen. IMPRESSION: 1. No pneumonia or effusion.  Question bronchitis. 2. Left-sided Port-A-Cath tip in the region of the right atrium. Electronically Signed   By: Ivar Drape M.D.   On: 02/01/2017 15:52    ASSESSMENT & PLAN:   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, estrogen receptor positive ([place]) # Breast cancer-stage II ER/PR positive HER-2/neu negative. PT1cpN1sn. Currently on Adjuvant chemotherapy with Adriamycin-Cytoxan- Taxol w x12. Pt on Taxol weekly; cycle #4  # Proceed with cycle #5 ; labs from last week normal; acceptable for chemotherapy.  # Insomnia/anxiety- recommend start celexa 20 mg/day.   # quit smoking- congratulated pt on quit smoking.   # Follow-up M.D. labs chemotherapy in 2 weeks; lab chemotherapy 1 week.     Cammie Sickle, MD 02/22/2017 12:44 PM

## 2017-02-22 NOTE — Assessment & Plan Note (Signed)
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 PT1cpN1sn. Currently on Adjuvant chemotherapy with Adriamycin-Cytoxan- Taxol w x12. Pt on Taxol weekly; cycle #4  # Proceed with cycle #5 ; labs from last week normal; acceptable for chemotherapy.  # Insomnia/anxiety- recommend start celexa 20 mg/day.   # quit smoking- congratulated pt on quit smoking.   # Follow-up M.D. labs chemotherapy in 2 weeks; lab chemotherapy 1 week.

## 2017-02-23 ENCOUNTER — Ambulatory Visit: Payer: 59

## 2017-02-28 ENCOUNTER — Other Ambulatory Visit: Payer: 59

## 2017-02-28 ENCOUNTER — Inpatient Hospital Stay: Payer: 59

## 2017-02-28 ENCOUNTER — Other Ambulatory Visit: Payer: Self-pay | Admitting: Internal Medicine

## 2017-02-28 DIAGNOSIS — C50511 Malignant neoplasm of lower-outer quadrant of right female breast: Secondary | ICD-10-CM | POA: Diagnosis not present

## 2017-02-28 DIAGNOSIS — Z170 Estrogen receptor positive status [ER+]: Principal | ICD-10-CM

## 2017-02-28 LAB — CBC WITH DIFFERENTIAL/PLATELET
BASOS PCT: 1 %
Basophils Absolute: 0 10*3/uL (ref 0–0.1)
Eosinophils Absolute: 0.2 10*3/uL (ref 0–0.7)
Eosinophils Relative: 4 %
HEMATOCRIT: 35.8 % (ref 35.0–47.0)
HEMOGLOBIN: 12 g/dL (ref 12.0–16.0)
LYMPHS ABS: 0.9 10*3/uL — AB (ref 1.0–3.6)
LYMPHS PCT: 16 %
MCH: 30.7 pg (ref 26.0–34.0)
MCHC: 33.6 g/dL (ref 32.0–36.0)
MCV: 91.3 fL (ref 80.0–100.0)
MONOS PCT: 7 %
Monocytes Absolute: 0.4 10*3/uL (ref 0.2–0.9)
NEUTROS ABS: 4 10*3/uL (ref 1.4–6.5)
NEUTROS PCT: 72 %
Platelets: 285 10*3/uL (ref 150–440)
RBC: 3.93 MIL/uL (ref 3.80–5.20)
RDW: 16.7 % — ABNORMAL HIGH (ref 11.5–14.5)
WBC: 5.5 10*3/uL (ref 3.6–11.0)

## 2017-02-28 LAB — BASIC METABOLIC PANEL
Anion gap: 4 — ABNORMAL LOW (ref 5–15)
BUN: 11 mg/dL (ref 6–20)
CHLORIDE: 105 mmol/L (ref 101–111)
CO2: 29 mmol/L (ref 22–32)
CREATININE: 0.64 mg/dL (ref 0.44–1.00)
Calcium: 9 mg/dL (ref 8.9–10.3)
GFR calc non Af Amer: >60 mL/min (ref >60)
GLUCOSE: 100 mg/dL — AB (ref 65–99)
Potassium: 4.1 mmol/L (ref 3.5–5.1)
Sodium: 138 mmol/L (ref 135–145)

## 2017-03-01 ENCOUNTER — Other Ambulatory Visit: Payer: 59

## 2017-03-01 ENCOUNTER — Ambulatory Visit: Payer: 59

## 2017-03-02 ENCOUNTER — Inpatient Hospital Stay: Payer: 59

## 2017-03-02 VITALS — BP 121/84 | HR 93 | Temp 96.6°F | Resp 18

## 2017-03-02 DIAGNOSIS — Z170 Estrogen receptor positive status [ER+]: Principal | ICD-10-CM

## 2017-03-02 DIAGNOSIS — C50511 Malignant neoplasm of lower-outer quadrant of right female breast: Secondary | ICD-10-CM

## 2017-03-02 MED ORDER — FAMOTIDINE IN NACL 20-0.9 MG/50ML-% IV SOLN
20.0000 mg | Freq: Once | INTRAVENOUS | Status: AC
  Administered 2017-03-02: 20 mg via INTRAVENOUS
  Filled 2017-03-02: qty 50

## 2017-03-02 MED ORDER — SODIUM CHLORIDE 0.9 % IV SOLN
Freq: Once | INTRAVENOUS | Status: AC
  Administered 2017-03-02: 10:00:00 via INTRAVENOUS
  Filled 2017-03-02: qty 1000

## 2017-03-02 MED ORDER — DIPHENHYDRAMINE HCL 50 MG/ML IJ SOLN
25.0000 mg | Freq: Once | INTRAMUSCULAR | Status: AC
  Administered 2017-03-02: 25 mg via INTRAVENOUS
  Filled 2017-03-02: qty 1

## 2017-03-02 MED ORDER — HEPARIN SOD (PORK) LOCK FLUSH 100 UNIT/ML IV SOLN
INTRAVENOUS | Status: AC
  Filled 2017-03-02: qty 5

## 2017-03-02 MED ORDER — SODIUM CHLORIDE 0.9 % IV SOLN
80.0000 mg/m2 | Freq: Once | INTRAVENOUS | Status: AC
  Administered 2017-03-02: 162 mg via INTRAVENOUS
  Filled 2017-03-02: qty 27

## 2017-03-02 MED ORDER — SODIUM CHLORIDE 0.9% FLUSH
10.0000 mL | INTRAVENOUS | Status: DC | PRN
  Administered 2017-03-02: 10 mL
  Filled 2017-03-02: qty 10

## 2017-03-02 MED ORDER — SODIUM CHLORIDE 0.9 % IV SOLN
20.0000 mg | Freq: Once | INTRAVENOUS | Status: AC
  Administered 2017-03-02: 20 mg via INTRAVENOUS
  Filled 2017-03-02: qty 2

## 2017-03-02 MED ORDER — HEPARIN SOD (PORK) LOCK FLUSH 100 UNIT/ML IV SOLN
500.0000 [IU] | Freq: Once | INTRAVENOUS | Status: AC | PRN
  Administered 2017-03-02: 500 [IU]

## 2017-03-02 NOTE — Patient Instructions (Signed)
Paclitaxel injection What is this medicine? PACLITAXEL (PAK li TAX el) is a chemotherapy drug. It targets fast dividing cells, like cancer cells, and causes these cells to die. This medicine is used to treat ovarian cancer, breast cancer, and other cancers. This medicine may be used for other purposes; ask your health care provider or pharmacist if you have questions. COMMON BRAND NAME(S): Onxol, Taxol What should I tell my health care provider before I take this medicine? They need to know if you have any of these conditions: -blood disorders -irregular heartbeat -infection (especially a virus infection such as chickenpox, cold sores, or herpes) -liver disease -previous or ongoing radiation therapy -an unusual or allergic reaction to paclitaxel, alcohol, polyoxyethylated castor oil, other chemotherapy agents, other medicines, foods, dyes, or preservatives -pregnant or trying to get pregnant -breast-feeding How should I use this medicine? This drug is given as an infusion into a vein. It is administered in a hospital or clinic by a specially trained health care professional. Talk to your pediatrician regarding the use of this medicine in children. Special care may be needed. Overdosage: If you think you have taken too much of this medicine contact a poison control center or emergency room at once. NOTE: This medicine is only for you. Do not share this medicine with others. What if I miss a dose? It is important not to miss your dose. Call your doctor or health care professional if you are unable to keep an appointment. What may interact with this medicine? Do not take this medicine with any of the following medications: -disulfiram -metronidazole This medicine may also interact with the following medications: -cyclosporine -diazepam -ketoconazole -medicines to increase blood counts like filgrastim, pegfilgrastim, sargramostim -other chemotherapy drugs like cisplatin, doxorubicin,  epirubicin, etoposide, teniposide, vincristine -quinidine -testosterone -vaccines -verapamil Talk to your doctor or health care professional before taking any of these medicines: -acetaminophen -aspirin -ibuprofen -ketoprofen -naproxen This list may not describe all possible interactions. Give your health care provider a list of all the medicines, herbs, non-prescription drugs, or dietary supplements you use. Also tell them if you smoke, drink alcohol, or use illegal drugs. Some items may interact with your medicine. What should I watch for while using this medicine? Your condition will be monitored carefully while you are receiving this medicine. You will need important blood work done while you are taking this medicine. This medicine can cause serious allergic reactions. To reduce your risk you will need to take other medicine(s) before treatment with this medicine. If you experience allergic reactions like skin rash, itching or hives, swelling of the face, lips, or tongue, tell your doctor or health care professional right away. In some cases, you may be given additional medicines to help with side effects. Follow all directions for their use. This drug may make you feel generally unwell. This is not uncommon, as chemotherapy can affect healthy cells as well as cancer cells. Report any side effects. Continue your course of treatment even though you feel ill unless your doctor tells you to stop. Call your doctor or health care professional for advice if you get a fever, chills or sore throat, or other symptoms of a cold or flu. Do not treat yourself. This drug decreases your body's ability to fight infections. Try to avoid being around people who are sick. This medicine may increase your risk to bruise or bleed. Call your doctor or health care professional if you notice any unusual bleeding. Be careful brushing and flossing your teeth or   using a toothpick because you may get an infection or  bleed more easily. If you have any dental work done, tell your dentist you are receiving this medicine. Avoid taking products that contain aspirin, acetaminophen, ibuprofen, naproxen, or ketoprofen unless instructed by your doctor. These medicines may hide a fever. Do not become pregnant while taking this medicine. Women should inform their doctor if they wish to become pregnant or think they might be pregnant. There is a potential for serious side effects to an unborn child. Talk to your health care professional or pharmacist for more information. Do not breast-feed an infant while taking this medicine. Men are advised not to father a child while receiving this medicine. This product may contain alcohol. Ask your pharmacist or healthcare provider if this medicine contains alcohol. Be sure to tell all healthcare providers you are taking this medicine. Certain medicines, like metronidazole and disulfiram, can cause an unpleasant reaction when taken with alcohol. The reaction includes flushing, headache, nausea, vomiting, sweating, and increased thirst. The reaction can last from 30 minutes to several hours. What side effects may I notice from receiving this medicine? Side effects that you should report to your doctor or health care professional as soon as possible: -allergic reactions like skin rash, itching or hives, swelling of the face, lips, or tongue -low blood counts - This drug may decrease the number of white blood cells, red blood cells and platelets. You may be at increased risk for infections and bleeding. -signs of infection - fever or chills, cough, sore throat, pain or difficulty passing urine -signs of decreased platelets or bleeding - bruising, pinpoint red spots on the skin, black, tarry stools, nosebleeds -signs of decreased red blood cells - unusually weak or tired, fainting spells, lightheadedness -breathing problems -chest pain -high or low blood pressure -mouth sores -nausea and  vomiting -pain, swelling, redness or irritation at the injection site -pain, tingling, numbness in the hands or feet -slow or irregular heartbeat -swelling of the ankle, feet, hands Side effects that usually do not require medical attention (report to your doctor or health care professional if they continue or are bothersome): -bone pain -complete hair loss including hair on your head, underarms, pubic hair, eyebrows, and eyelashes -changes in the color of fingernails -diarrhea -loosening of the fingernails -loss of appetite -muscle or joint pain -red flush to skin -sweating This list may not describe all possible side effects. Call your doctor for medical advice about side effects. You may report side effects to FDA at 1-800-FDA-1088. Where should I keep my medicine? This drug is given in a hospital or clinic and will not be stored at home. NOTE: This sheet is a summary. It may not cover all possible information. If you have questions about this medicine, talk to your doctor, pharmacist, or health care provider.  2018 Elsevier/Gold Standard (2015-09-09 19:58:00)  

## 2017-03-08 ENCOUNTER — Inpatient Hospital Stay: Payer: 59

## 2017-03-08 ENCOUNTER — Ambulatory Visit: Payer: 59 | Admitting: Internal Medicine

## 2017-03-08 ENCOUNTER — Other Ambulatory Visit: Payer: 59

## 2017-03-08 ENCOUNTER — Inpatient Hospital Stay (HOSPITAL_BASED_OUTPATIENT_CLINIC_OR_DEPARTMENT_OTHER): Payer: 59 | Admitting: Internal Medicine

## 2017-03-08 VITALS — BP 142/82 | HR 76 | Temp 98.0°F | Resp 20 | Ht 61.0 in | Wt 191.8 lb

## 2017-03-08 DIAGNOSIS — C50511 Malignant neoplasm of lower-outer quadrant of right female breast: Secondary | ICD-10-CM | POA: Diagnosis not present

## 2017-03-08 DIAGNOSIS — Z170 Estrogen receptor positive status [ER+]: Secondary | ICD-10-CM | POA: Diagnosis not present

## 2017-03-08 DIAGNOSIS — Z79899 Other long term (current) drug therapy: Secondary | ICD-10-CM | POA: Diagnosis not present

## 2017-03-08 LAB — CBC WITH DIFFERENTIAL/PLATELET
Basophils Absolute: 0.1 10*3/uL (ref 0–0.1)
Basophils Relative: 1 %
Eosinophils Absolute: 0.1 10*3/uL (ref 0–0.7)
Eosinophils Relative: 2 %
HCT: 37.3 % (ref 35.0–47.0)
Hemoglobin: 12.7 g/dL (ref 12.0–16.0)
Lymphocytes Relative: 18 %
Lymphs Abs: 0.9 10*3/uL — ABNORMAL LOW (ref 1.0–3.6)
MCH: 30.7 pg (ref 26.0–34.0)
MCHC: 34 g/dL (ref 32.0–36.0)
MCV: 90.4 fL (ref 80.0–100.0)
Monocytes Absolute: 0.4 10*3/uL (ref 0.2–0.9)
Monocytes Relative: 7 %
Neutro Abs: 3.9 10*3/uL (ref 1.4–6.5)
Neutrophils Relative %: 72 %
Platelets: 290 10*3/uL (ref 150–440)
RBC: 4.13 MIL/uL (ref 3.80–5.20)
RDW: 15.4 % — ABNORMAL HIGH (ref 11.5–14.5)
WBC: 5.4 10*3/uL (ref 3.6–11.0)

## 2017-03-08 LAB — COMPREHENSIVE METABOLIC PANEL
ALK PHOS: 101 U/L (ref 38–126)
ALT: 32 U/L (ref 14–54)
ANION GAP: 8 (ref 5–15)
AST: 22 U/L (ref 15–41)
Albumin: 4 g/dL (ref 3.5–5.0)
BUN: 9 mg/dL (ref 6–20)
CALCIUM: 9 mg/dL (ref 8.9–10.3)
CO2: 26 mmol/L (ref 22–32)
Chloride: 101 mmol/L (ref 101–111)
Creatinine, Ser: 0.57 mg/dL (ref 0.44–1.00)
GFR calc non Af Amer: >60 mL/min (ref >60)
Glucose, Bld: 107 mg/dL — ABNORMAL HIGH (ref 65–99)
Potassium: 3.9 mmol/L (ref 3.5–5.1)
SODIUM: 135 mmol/L (ref 135–145)
Total Bilirubin: 0.8 mg/dL (ref 0.3–1.2)
Total Protein: 6.8 g/dL (ref 6.5–8.1)

## 2017-03-08 MED ORDER — SODIUM CHLORIDE 0.9 % IV SOLN
80.0000 mg/m2 | Freq: Once | INTRAVENOUS | Status: AC
  Administered 2017-03-09: 162 mg via INTRAVENOUS
  Filled 2017-03-08: qty 27

## 2017-03-08 MED ORDER — SODIUM CHLORIDE 0.9 % IV SOLN
Freq: Once | INTRAVENOUS | Status: AC
  Administered 2017-03-09: 09:00:00 via INTRAVENOUS
  Filled 2017-03-08: qty 1000

## 2017-03-08 MED ORDER — DIPHENHYDRAMINE HCL 50 MG/ML IJ SOLN
25.0000 mg | Freq: Once | INTRAMUSCULAR | Status: AC
  Administered 2017-03-09: 25 mg via INTRAVENOUS
  Filled 2017-03-08: qty 1

## 2017-03-08 MED ORDER — SODIUM CHLORIDE 0.9 % IV SOLN
20.0000 mg | Freq: Once | INTRAVENOUS | Status: AC
  Administered 2017-03-09: 20 mg via INTRAVENOUS
  Filled 2017-03-08: qty 2

## 2017-03-08 MED ORDER — FAMOTIDINE IN NACL 20-0.9 MG/50ML-% IV SOLN
20.0000 mg | Freq: Once | INTRAVENOUS | Status: AC
  Administered 2017-03-09: 20 mg via INTRAVENOUS
  Filled 2017-03-08: qty 50

## 2017-03-08 NOTE — Progress Notes (Signed)
While he was in [place] NOTE  Patient Care Team: Steele Sizer, MD as PCP - General (Family Medicine) Seeplaputhur Robinette Haines, MD (General Surgery) Steele Sizer, MD as Attending Physician (Family Medicine)  CHIEF COMPLAINTS/PURPOSE OF CONSULTATION:  Breast cancer  #  Oncology History   # DEC 2017- RIGHT BREAST Stage II [pT1cpN1sn; screening] ER/PR- Pos- 90%; her 2 NEU- NEG s/p Lumpect & SLNBx; Dr.Sankar ]; JAN 2018- ddAC -T x12.   # MRI liver- hemagiomas  # Genetic counseling- DICER-1 VUS**  # TAH & BSO [Dr.Hall; [facility]; [id number]]; smoking     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, estrogen receptor positive ([place])   10/26/2016 Initial Diagnosis    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, estrogen receptor positive ([place])       HISTORY OF PRESENTING ILLNESS:  Shelby Barry 51 y.o.  female above history of ER/PR positive HER-2/neu negative stage II breast cancer- status post cycle 4 cycles of  Adriamycin and Cytoxan; Currently on weekly Taxol is here for follow-up. Patient is status post 6 Taxol treatments.   patient complains of significant fatigue. Also complains of muscle aches. Continues to deal with depression/crying spells and anxiety. However, she seems to be coping well. Does not want to try celexa. But no tingling or numbness in hand or feet. No nausea no vomiting.She denies fevers or chills.  ROS: A complete 10 point review of system is done which is negative except mentioned above in history of present illness  MEDICAL HISTORY:  Past Medical History:  Diagnosis Date  . Anemia   . Anxiety   . Bursitis of right shoulder   . Cancer [facility])    breast  . 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, estrogen receptor positive ([place])   . Cataract, bilateral   . Chemotherapy induced nausea and vomiting   . Cold    states she has had it for 2 months  . Depression   . GERD  (gastroesophageal reflux disease)    uses baking soda for symptoms  . Insomnia   . Lumbago   . Vitamin D deficiency     SURGICAL HISTORY: Past Surgical History:  Procedure Laterality Date  . ABDOMINAL HYSTERECTOMY  04/20/2014  . BILATERAL SALPINGECTOMY    . BREAST LUMPECTOMY WITH SENTINEL LYMPH NODE BIOPSY Right 10/12/2016   Procedure: BREAST LUMPECTOMY WITH SENTINEL LYMPH NODE BX;  Surgeon: Christene Lye, MD;  Location: [facility];  Service: General;  Laterality: Right;  . COLONOSCOPY WITH PROPOFOL N/A 05/20/2016   Procedure: COLONOSCOPY WITH PROPOFOL;  Surgeon: Lucilla Lame, MD;  Location: [place];  Service: Endoscopy;  Laterality: N/A;  . ECTOPIC PREGNANCY SURGERY    . ENDOMETRIAL ABLATION    . PORTACATH PLACEMENT Left 11/04/2016   Procedure: INSERTION PORT-A-CATH;  Surgeon: Christene Lye, MD;  Location: [facility];  Service: General;  Laterality: Left;  Left subclavian vein  . TUBAL LIGATION      SOCIAL HISTORY: [employer]; in [place]; 1 pack/day; occassional alochol.  2 grown- children- 63 boy & 20 daughter Social History   Social History  . Marital status: Divorced    Spouse name: N/A  . Number of children: N/A  . Years of education: N/A   Occupational History  . Not on file.   Social History Main Topics  . Smoking status: Former Smoker    Packs/day: 0.50    Years: 20.00    Types:  Cigarettes    Quit date: 08/23/2016  . Smokeless tobacco: Never Used  . Alcohol use 0.0 oz/week     Comment: occassional  . Drug use: No  . Sexual activity: Yes    Partners: Male   Other Topics Concern  . Not on file   Social History Narrative   She is living with [name] ( boyfriend ) for the past two years, and his 20 yo daughter just moved in with them Fall 2017. Her mother is doing drugs.    Works as [employer]    FAMILY HISTORY: 2 sisters- one sister breast ca at ? 53y; other sister- ? Cancer; aunt/mom's sister- stomach cancer [60s].  Family History   Problem Relation Age of Onset  . Depression Father   . Obesity Father   . Alcohol abuse Brother   . Seizures Brother   . Breast cancer Sister 55    ALLERGIES:  is allergic to adhesive [tape] and nickel.  MEDICATIONS:  Current Outpatient Prescriptions  Medication Sig Dispense Refill  . lidocaine-prilocaine (EMLA) cream APPLY TOPICALLY AS NEEDED.  APPLY GENEROUSLY OVER THE  MEDIPORT 45 MINUTES PRIOR  TO CHEMOTHERAPY. 30 g 1  . omeprazole (PRILOSEC) 20 MG capsule Take 1 capsule (20 mg total) by mouth daily. 60 capsule 3  . zolpidem (AMBIEN) 10 MG tablet Take 0.5-1 tablets (5-10 mg total) by mouth at bedtime as needed for sleep. 90 tablet 3  . acetaminophen (TYLENOL) 325 MG tablet Take 325 mg by mouth every 6 (six) hours as needed for fever.    . Bismuth Subsalicylate (PEPTO-BISMOL PO) Take 1 tablet by mouth daily as needed (indigestion).    . citalopram (CELEXA) 20 MG tablet Take 1 tablet (20 mg total) by mouth daily. (Patient not taking: Reported on 03/08/2017) 30 tablet 3  . fluticasone furoate-vilanterol (BREO ELLIPTA) 100-25 MCG/INH AEPB Inhale 1 puff into the lungs daily. (Patient not taking: Reported on 03/08/2017) 60 each 0  . LORazepam (ATIVAN) 0.5 MG tablet Take 1 tablet (0.5 mg total) by mouth every 6 (six) hours as needed for anxiety (anticipatory nausea). (Patient not taking: Reported on 03/08/2017) 30 tablet 3  . magic mouthwash w/lidocaine SOLN Take 10 mLs by mouth 4 (four) times daily as needed for mouth pain. (Patient not taking: Reported on 03/08/2017) 480 mL 3  . Multiple Vitamin (MULTIVITAMIN WITH MINERALS) TABS tablet Take 1 tablet by mouth daily. One-A-Day    . ondansetron (ZOFRAN) 8 MG tablet TAKE 1 TABLET BY MOUTH  EVERY 8 HOURS AS NEEDED FOR NAUSEA OR VOMITING. START 3 DAYS AFTER CHEMO (Patient not taking: Reported on 03/08/2017) 80 tablet 0  . prochlorperazine (COMPAZINE) 10 MG tablet Take 1 tablet (10 mg total) by mouth every 6 (six) hours as needed for nausea or vomiting.  (Patient not taking: Reported on 03/08/2017) 40 tablet 1   No current facility-administered medications for this visit.    Facility-Administered Medications Ordered in Other Visits  Medication Dose Route Frequency Provider Last Rate Last Dose  . [START ON 03/09/2017] 0.9 %  sodium chloride infusion   Intravenous Once Cammie Sickle, MD      . Derrill Memo ON 03/09/2017] dexamethasone (DECADRON) 20 mg in sodium chloride 0.9 % 50 mL IVPB  20 mg Intravenous Once Cammie Sickle, MD      . Derrill Memo ON 03/09/2017] diphenhydrAMINE (BENADRYL) injection 25 mg  25 mg Intravenous Once Cammie Sickle, MD      . Derrill Memo ON 03/09/2017] famotidine (PEPCID) IVPB 20 mg  premix  20 mg Intravenous Once Cammie Sickle, MD      . Derrill Memo ON 03/09/2017] PACLitaxel (TAXOL) 162 mg in sodium chloride 0.9 % 250 mL chemo infusion (</= 46m/m2)  80 mg/m2 (Treatment Plan Recorded) Intravenous Once GCammie Sickle MD          .  PHYSICAL EXAMINATION: ECOG PERFORMANCE STATUS: 0 - Asymptomatic  Vitals:   03/08/17 1517  BP: (!) 142/82  Pulse: 76  Resp: 20  Temp: 98 F (36.7 C)   Filed Weights   03/08/17 1517  Weight: 191 lb 12.8 oz (87 kg)    GENERAL: Well-nourished well-developed; Alert, no distress and comfortable. Alone.   EYES: no pallor or icterus OROPHARYNX: no thrush or ulceration; good dentition  NECK: supple, no masses felt LYMPH:  no palpable lymphadenopathy in the cervical, axillary or inguinal regions LUNGS: clear to auscultation and  No wheeze or crackles HEART/CVS: regular rate & rhythm and no murmurs; No lower extremity edema ABDOMEN: abdomen soft, non-tender and normal bowel sounds Musculoskeletal:no cyanosis of digits and no clubbing  PSYCH: alert & oriented x 3 with fluent speech NEURO: no focal motor/sensory deficits SKIN:  no rashes or significant lesions  LABORATORY DATA:  I have reviewed the data as listed Lab Results  Component Value Date   WBC 5.4 03/08/2017    HGB 12.7 03/08/2017   HCT 37.3 03/08/2017   MCV 90.4 03/08/2017   PLT 290 03/08/2017    Recent Labs  02/01/17 1349  02/21/17 1047 02/28/17 0909 03/08/17 1459  NA 140  < > 137 138 135  K 4.3  < > 4.3 4.1 3.9  CL 103  < > 104 105 101  CO2 30  < > _0 GLUCOSE 96  < > 118* 100* 107*  BUN 15  < > _1 CREATININE 0.54  < > 0.69 0.64 0.57  CALCIUM 9.1  < > 8.9 9.0 9.0  GFRNONAA >60  < > >60 >60 >60  GFRAA >60  < > >60 >60 >60  PROT 7.1  --  6.5  --  6.8  ALBUMIN 3.8  --  3.9  --  4.0  AST 21  --  30  --  22  ALT 34  --  55*  --  32  ALKPHOS 88  --  89  --  101  BILITOT <0.1*  --  0.5  --  0.8  < > = values in this interval not displayed.  RADIOGRAPHIC STUDIES: I have personally reviewed the radiological images as listed and agreed with the findings in the report. No results found.  ASSESSMENT & PLAN:   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, [place] receptor positive [place] # Breast cancer-stage II ER/PR positive HER-2/neu negative. PT1cpN1sn. Currently on Adjuvant chemotherapy with Adriamycin-Cytoxan- Taxol w x12. Pt on Taxol weekly; cycle #6  # Proceed with cycle #7 ; labs from last week normal; acceptable for chemotherapy.  # Insomnia/anxiety/depression- does not want to take celexa.   # fatigue- wants to hold off work; which I think being reasonable.   # quit smoking- congratulated pt on quit smoking.   # Follow-up M.D. labs chemotherapy in 2 weeks; lab chemotherapy 1 week.     GCammie Sickle MD 03/08/2017 3:39 PM

## 2017-03-08 NOTE — Progress Notes (Signed)
Pt reports Tenderness at lateral side "feels bruised but no evidence of bruising." pt reports intermittent edema in lower extremities. Edema does not improve with elevation. Pt states that she sits down frequently. Pt reports that her skin on back of her calves often "feels tight."  Patient reports decrease po fluid intake. Pt denies N&V. Patient reports frequently bowel movements after eating, but no diarrhea. Denies any mouth sores, fevers or chills.   Pt states that she is unable to return back to work at this time due to chronic fatigue. Would like a new return to work date to be June 22, 2017. She is not able to work at all.

## 2017-03-08 NOTE — Assessment & Plan Note (Signed)
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 PT1cpN1sn. Currently on Adjuvant chemotherapy with Adriamycin-Cytoxan- Taxol w x12. Pt on Taxol weekly; cycle #6  # Proceed with cycle #7 ; labs from last week normal; acceptable for chemotherapy.  # Insomnia/anxiety/depression- does not want to take celexa.   # fatigue- wants to hold off work; which I think being reasonable.   # quit smoking- congratulated pt on quit smoking.   # Follow-up M.D. labs chemotherapy in 2 weeks; lab chemotherapy 1 week.

## 2017-03-09 ENCOUNTER — Inpatient Hospital Stay: Payer: 59

## 2017-03-09 VITALS — BP 123/73 | HR 77 | Temp 97.3°F | Resp 18

## 2017-03-09 DIAGNOSIS — Z170 Estrogen receptor positive status [ER+]: Principal | ICD-10-CM

## 2017-03-09 DIAGNOSIS — C50511 Malignant neoplasm of lower-outer quadrant of right female breast: Secondary | ICD-10-CM | POA: Diagnosis not present

## 2017-03-09 MED ORDER — HEPARIN SOD (PORK) LOCK FLUSH 100 UNIT/ML IV SOLN
500.0000 [IU] | Freq: Once | INTRAVENOUS | Status: AC | PRN
  Administered 2017-03-09: 500 [IU]

## 2017-03-09 MED ORDER — HEPARIN SOD (PORK) LOCK FLUSH 100 UNIT/ML IV SOLN
INTRAVENOUS | Status: AC
  Filled 2017-03-09: qty 5

## 2017-03-09 MED ORDER — SODIUM CHLORIDE 0.9% FLUSH
10.0000 mL | INTRAVENOUS | Status: DC | PRN
  Administered 2017-03-09: 10 mL
  Filled 2017-03-09: qty 10

## 2017-03-09 NOTE — Patient Instructions (Signed)
Paclitaxel injection What is this medicine? PACLITAXEL (PAK li TAX el) is a chemotherapy drug. It targets fast dividing cells, like cancer cells, and causes these cells to die. This medicine is used to treat ovarian cancer, breast cancer, and other cancers. This medicine may be used for other purposes; ask your health care provider or pharmacist if you have questions. COMMON BRAND NAME(S): Onxol, Taxol What should I tell my health care provider before I take this medicine? They need to know if you have any of these conditions: -blood disorders -irregular heartbeat -infection (especially a virus infection such as chickenpox, cold sores, or herpes) -liver disease -previous or ongoing radiation therapy -an unusual or allergic reaction to paclitaxel, alcohol, polyoxyethylated castor oil, other chemotherapy agents, other medicines, foods, dyes, or preservatives -pregnant or trying to get pregnant -breast-feeding How should I use this medicine? This drug is given as an infusion into a vein. It is administered in a hospital or clinic by a specially trained health care professional. Talk to your pediatrician regarding the use of this medicine in children. Special care may be needed. Overdosage: If you think you have taken too much of this medicine contact a poison control center or emergency room at once. NOTE: This medicine is only for you. Do not share this medicine with others. What if I miss a dose? It is important not to miss your dose. Call your doctor or health care professional if you are unable to keep an appointment. What may interact with this medicine? Do not take this medicine with any of the following medications: -disulfiram -metronidazole This medicine may also interact with the following medications: -cyclosporine -diazepam -ketoconazole -medicines to increase blood counts like filgrastim, pegfilgrastim, sargramostim -other chemotherapy drugs like cisplatin, doxorubicin,  epirubicin, etoposide, teniposide, vincristine -quinidine -testosterone -vaccines -verapamil Talk to your doctor or health care professional before taking any of these medicines: -acetaminophen -aspirin -ibuprofen -ketoprofen -naproxen This list may not describe all possible interactions. Give your health care provider a list of all the medicines, herbs, non-prescription drugs, or dietary supplements you use. Also tell them if you smoke, drink alcohol, or use illegal drugs. Some items may interact with your medicine. What should I watch for while using this medicine? Your condition will be monitored carefully while you are receiving this medicine. You will need important blood work done while you are taking this medicine. This medicine can cause serious allergic reactions. To reduce your risk you will need to take other medicine(s) before treatment with this medicine. If you experience allergic reactions like skin rash, itching or hives, swelling of the face, lips, or tongue, tell your doctor or health care professional right away. In some cases, you may be given additional medicines to help with side effects. Follow all directions for their use. This drug may make you feel generally unwell. This is not uncommon, as chemotherapy can affect healthy cells as well as cancer cells. Report any side effects. Continue your course of treatment even though you feel ill unless your doctor tells you to stop. Call your doctor or health care professional for advice if you get a fever, chills or sore throat, or other symptoms of a cold or flu. Do not treat yourself. This drug decreases your body's ability to fight infections. Try to avoid being around people who are sick. This medicine may increase your risk to bruise or bleed. Call your doctor or health care professional if you notice any unusual bleeding. Be careful brushing and flossing your teeth or   using a toothpick because you may get an infection or  bleed more easily. If you have any dental work done, tell your dentist you are receiving this medicine. Avoid taking products that contain aspirin, acetaminophen, ibuprofen, naproxen, or ketoprofen unless instructed by your doctor. These medicines may hide a fever. Do not become pregnant while taking this medicine. Women should inform their doctor if they wish to become pregnant or think they might be pregnant. There is a potential for serious side effects to an unborn child. Talk to your health care professional or pharmacist for more information. Do not breast-feed an infant while taking this medicine. Men are advised not to father a child while receiving this medicine. This product may contain alcohol. Ask your pharmacist or healthcare provider if this medicine contains alcohol. Be sure to tell all healthcare providers you are taking this medicine. Certain medicines, like metronidazole and disulfiram, can cause an unpleasant reaction when taken with alcohol. The reaction includes flushing, headache, nausea, vomiting, sweating, and increased thirst. The reaction can last from 30 minutes to several hours. What side effects may I notice from receiving this medicine? Side effects that you should report to your doctor or health care professional as soon as possible: -allergic reactions like skin rash, itching or hives, swelling of the face, lips, or tongue -low blood counts - This drug may decrease the number of white blood cells, red blood cells and platelets. You may be at increased risk for infections and bleeding. -signs of infection - fever or chills, cough, sore throat, pain or difficulty passing urine -signs of decreased platelets or bleeding - bruising, pinpoint red spots on the skin, black, tarry stools, nosebleeds -signs of decreased red blood cells - unusually weak or tired, fainting spells, lightheadedness -breathing problems -chest pain -high or low blood pressure -mouth sores -nausea and  vomiting -pain, swelling, redness or irritation at the injection site -pain, tingling, numbness in the hands or feet -slow or irregular heartbeat -swelling of the ankle, feet, hands Side effects that usually do not require medical attention (report to your doctor or health care professional if they continue or are bothersome): -bone pain -complete hair loss including hair on your head, underarms, pubic hair, eyebrows, and eyelashes -changes in the color of fingernails -diarrhea -loosening of the fingernails -loss of appetite -muscle or joint pain -red flush to skin -sweating This list may not describe all possible side effects. Call your doctor for medical advice about side effects. You may report side effects to FDA at 1-800-FDA-1088. Where should I keep my medicine? This drug is given in a hospital or clinic and will not be stored at home. NOTE: This sheet is a summary. It may not cover all possible information. If you have questions about this medicine, talk to your doctor, pharmacist, or health care provider.  2018 Elsevier/Gold Standard (2015-09-09 19:58:00)  

## 2017-03-14 ENCOUNTER — Inpatient Hospital Stay: Payer: 59

## 2017-03-14 DIAGNOSIS — C50511 Malignant neoplasm of lower-outer quadrant of right female breast: Secondary | ICD-10-CM | POA: Diagnosis not present

## 2017-03-14 DIAGNOSIS — Z170 Estrogen receptor positive status [ER+]: Principal | ICD-10-CM

## 2017-03-14 LAB — CBC WITH DIFFERENTIAL/PLATELET
BASOS ABS: 0.1 10*3/uL (ref 0–0.1)
Basophils Relative: 1 %
Eosinophils Absolute: 0.1 10*3/uL (ref 0–0.7)
Eosinophils Relative: 1 %
HEMATOCRIT: 38.9 % (ref 35.0–47.0)
HEMOGLOBIN: 13.3 g/dL (ref 12.0–16.0)
LYMPHS ABS: 1.2 10*3/uL (ref 1.0–3.6)
LYMPHS PCT: 18 %
MCH: 31.1 pg (ref 26.0–34.0)
MCHC: 34.1 g/dL (ref 32.0–36.0)
MCV: 91.2 fL (ref 80.0–100.0)
Monocytes Absolute: 0.4 10*3/uL (ref 0.2–0.9)
Monocytes Relative: 6 %
NEUTROS ABS: 4.9 10*3/uL (ref 1.4–6.5)
Neutrophils Relative %: 74 %
Platelets: 305 10*3/uL (ref 150–440)
RBC: 4.27 MIL/uL (ref 3.80–5.20)
RDW: 15.2 % — ABNORMAL HIGH (ref 11.5–14.5)
WBC: 6.6 10*3/uL (ref 3.6–11.0)

## 2017-03-14 LAB — COMPREHENSIVE METABOLIC PANEL
ALK PHOS: 95 U/L (ref 38–126)
ALT: 31 U/L (ref 14–54)
AST: 23 U/L (ref 15–41)
Albumin: 3.8 g/dL (ref 3.5–5.0)
Anion gap: 5 (ref 5–15)
BILIRUBIN TOTAL: 0.3 mg/dL (ref 0.3–1.2)
BUN: 14 mg/dL (ref 6–20)
CO2: 28 mmol/L (ref 22–32)
Calcium: 9 mg/dL (ref 8.9–10.3)
Chloride: 104 mmol/L (ref 101–111)
Creatinine, Ser: 0.62 mg/dL (ref 0.44–1.00)
GFR calc Af Amer: >60 mL/min (ref >60)
Glucose, Bld: 93 mg/dL (ref 65–99)
POTASSIUM: 4.3 mmol/L (ref 3.5–5.1)
Sodium: 137 mmol/L (ref 135–145)
TOTAL PROTEIN: 6.9 g/dL (ref 6.5–8.1)

## 2017-03-15 ENCOUNTER — Other Ambulatory Visit: Payer: 59

## 2017-03-15 ENCOUNTER — Inpatient Hospital Stay: Payer: 59

## 2017-03-15 VITALS — BP 117/70 | HR 80 | Temp 98.4°F | Resp 18

## 2017-03-15 DIAGNOSIS — Z170 Estrogen receptor positive status [ER+]: Principal | ICD-10-CM

## 2017-03-15 DIAGNOSIS — C50511 Malignant neoplasm of lower-outer quadrant of right female breast: Secondary | ICD-10-CM

## 2017-03-15 MED ORDER — DIPHENHYDRAMINE HCL 50 MG/ML IJ SOLN
25.0000 mg | Freq: Once | INTRAMUSCULAR | Status: AC
  Administered 2017-03-15: 25 mg via INTRAVENOUS
  Filled 2017-03-15: qty 1

## 2017-03-15 MED ORDER — SODIUM CHLORIDE 0.9 % IV SOLN
20.0000 mg | Freq: Once | INTRAVENOUS | Status: AC
  Administered 2017-03-15: 20 mg via INTRAVENOUS
  Filled 2017-03-15: qty 2

## 2017-03-15 MED ORDER — SODIUM CHLORIDE 0.9 % IV SOLN
Freq: Once | INTRAVENOUS | Status: AC
  Administered 2017-03-15: 10:00:00 via INTRAVENOUS
  Filled 2017-03-15: qty 1000

## 2017-03-15 MED ORDER — PEGFILGRASTIM 6 MG/0.6ML ~~LOC~~ PSKT
6.0000 mg | PREFILLED_SYRINGE | Freq: Once | SUBCUTANEOUS | Status: DC
  Filled 2017-03-15: qty 0.6

## 2017-03-15 MED ORDER — HEPARIN SOD (PORK) LOCK FLUSH 100 UNIT/ML IV SOLN
500.0000 [IU] | Freq: Once | INTRAVENOUS | Status: AC | PRN
  Administered 2017-03-15: 500 [IU]
  Filled 2017-03-15: qty 5

## 2017-03-15 MED ORDER — SODIUM CHLORIDE 0.9 % IV SOLN
80.0000 mg/m2 | Freq: Once | INTRAVENOUS | Status: AC
  Administered 2017-03-15: 162 mg via INTRAVENOUS
  Filled 2017-03-15: qty 27

## 2017-03-15 MED ORDER — FAMOTIDINE IN NACL 20-0.9 MG/50ML-% IV SOLN
20.0000 mg | Freq: Once | INTRAVENOUS | Status: AC
  Administered 2017-03-15: 20 mg via INTRAVENOUS
  Filled 2017-03-15: qty 50

## 2017-03-15 MED ORDER — SODIUM CHLORIDE 0.9% FLUSH
10.0000 mL | INTRAVENOUS | Status: DC | PRN
  Administered 2017-03-15: 10 mL
  Filled 2017-03-15: qty 10

## 2017-03-22 ENCOUNTER — Inpatient Hospital Stay: Payer: 59 | Attending: Internal Medicine | Admitting: Internal Medicine

## 2017-03-22 ENCOUNTER — Inpatient Hospital Stay: Payer: 59

## 2017-03-22 VITALS — BP 110/75 | HR 109 | Temp 98.3°F | Wt 192.5 lb

## 2017-03-22 DIAGNOSIS — G4700 Insomnia, unspecified: Secondary | ICD-10-CM | POA: Diagnosis not present

## 2017-03-22 DIAGNOSIS — E559 Vitamin D deficiency, unspecified: Secondary | ICD-10-CM | POA: Diagnosis not present

## 2017-03-22 DIAGNOSIS — Z5111 Encounter for antineoplastic chemotherapy: Secondary | ICD-10-CM | POA: Insufficient documentation

## 2017-03-22 DIAGNOSIS — K219 Gastro-esophageal reflux disease without esophagitis: Secondary | ICD-10-CM | POA: Insufficient documentation

## 2017-03-22 DIAGNOSIS — F419 Anxiety disorder, unspecified: Secondary | ICD-10-CM | POA: Insufficient documentation

## 2017-03-22 DIAGNOSIS — F329 Major depressive disorder, single episode, unspecified: Secondary | ICD-10-CM | POA: Insufficient documentation

## 2017-03-22 DIAGNOSIS — C50511 Malignant neoplasm of lower-outer quadrant of right female breast: Secondary | ICD-10-CM

## 2017-03-22 DIAGNOSIS — Z170 Estrogen receptor positive status [ER+]: Secondary | ICD-10-CM | POA: Diagnosis not present

## 2017-03-22 DIAGNOSIS — Z79899 Other long term (current) drug therapy: Secondary | ICD-10-CM | POA: Diagnosis not present

## 2017-03-22 DIAGNOSIS — F17210 Nicotine dependence, cigarettes, uncomplicated: Secondary | ICD-10-CM

## 2017-03-22 LAB — COMPREHENSIVE METABOLIC PANEL
ALT: 38 U/L (ref 14–54)
AST: 33 U/L (ref 15–41)
Albumin: 3.9 g/dL (ref 3.5–5.0)
Alkaline Phosphatase: 94 U/L (ref 38–126)
Anion gap: 10 (ref 5–15)
BUN: 11 mg/dL (ref 6–20)
CHLORIDE: 103 mmol/L (ref 101–111)
CO2: 23 mmol/L (ref 22–32)
Calcium: 9 mg/dL (ref 8.9–10.3)
Creatinine, Ser: 0.65 mg/dL (ref 0.44–1.00)
GFR calc Af Amer: >60 mL/min (ref >60)
Glucose, Bld: 151 mg/dL — ABNORMAL HIGH (ref 65–99)
POTASSIUM: 4 mmol/L (ref 3.5–5.1)
Sodium: 136 mmol/L (ref 135–145)
Total Bilirubin: 0.3 mg/dL (ref 0.3–1.2)
Total Protein: 6.6 g/dL (ref 6.5–8.1)

## 2017-03-22 LAB — CBC WITH DIFFERENTIAL/PLATELET
Basophils Absolute: 0.1 10*3/uL (ref 0–0.1)
Basophils Relative: 1 %
EOS PCT: 2 %
Eosinophils Absolute: 0.1 10*3/uL (ref 0–0.7)
HCT: 36.6 % (ref 35.0–47.0)
Hemoglobin: 12.4 g/dL (ref 12.0–16.0)
Lymphocytes Relative: 13 %
Lymphs Abs: 1 10*3/uL (ref 1.0–3.6)
MCH: 30.9 pg (ref 26.0–34.0)
MCHC: 34.1 g/dL (ref 32.0–36.0)
MCV: 90.6 fL (ref 80.0–100.0)
MONO ABS: 0.5 10*3/uL (ref 0.2–0.9)
MONOS PCT: 7 %
NEUTROS PCT: 77 %
Neutro Abs: 5.7 10*3/uL (ref 1.4–6.5)
PLATELETS: 320 10*3/uL (ref 150–440)
RBC: 4.03 MIL/uL (ref 3.80–5.20)
RDW: 14.7 % — AB (ref 11.5–14.5)
WBC: 7.4 10*3/uL (ref 3.6–11.0)

## 2017-03-22 NOTE — Patient Instructions (Signed)
Paclitaxel injection What is this medicine? PACLITAXEL (PAK li TAX el) is a chemotherapy drug. It targets fast dividing cells, like cancer cells, and causes these cells to die. This medicine is used to treat ovarian cancer, breast cancer, and other cancers. This medicine may be used for other purposes; ask your health care provider or pharmacist if you have questions. COMMON BRAND NAME(S): Onxol, Taxol What should I tell my health care provider before I take this medicine? They need to know if you have any of these conditions: -blood disorders -irregular heartbeat -infection (especially a virus infection such as chickenpox, cold sores, or herpes) -liver disease -previous or ongoing radiation therapy -an unusual or allergic reaction to paclitaxel, alcohol, polyoxyethylated castor oil, other chemotherapy agents, other medicines, foods, dyes, or preservatives -pregnant or trying to get pregnant -breast-feeding How should I use this medicine? This drug is given as an infusion into a vein. It is administered in a hospital or clinic by a specially trained health care professional. Talk to your pediatrician regarding the use of this medicine in children. Special care may be needed. Overdosage: If you think you have taken too much of this medicine contact a poison control center or emergency room at once. NOTE: This medicine is only for you. Do not share this medicine with others. What if I miss a dose? It is important not to miss your dose. Call your doctor or health care professional if you are unable to keep an appointment. What may interact with this medicine? Do not take this medicine with any of the following medications: -disulfiram -metronidazole This medicine may also interact with the following medications: -cyclosporine -diazepam -ketoconazole -medicines to increase blood counts like filgrastim, pegfilgrastim, sargramostim -other chemotherapy drugs like cisplatin, doxorubicin,  epirubicin, etoposide, teniposide, vincristine -quinidine -testosterone -vaccines -verapamil Talk to your doctor or health care professional before taking any of these medicines: -acetaminophen -aspirin -ibuprofen -ketoprofen -naproxen This list may not describe all possible interactions. Give your health care provider a list of all the medicines, herbs, non-prescription drugs, or dietary supplements you use. Also tell them if you smoke, drink alcohol, or use illegal drugs. Some items may interact with your medicine. What should I watch for while using this medicine? Your condition will be monitored carefully while you are receiving this medicine. You will need important blood work done while you are taking this medicine. This medicine can cause serious allergic reactions. To reduce your risk you will need to take other medicine(s) before treatment with this medicine. If you experience allergic reactions like skin rash, itching or hives, swelling of the face, lips, or tongue, tell your doctor or health care professional right away. In some cases, you may be given additional medicines to help with side effects. Follow all directions for their use. This drug may make you feel generally unwell. This is not uncommon, as chemotherapy can affect healthy cells as well as cancer cells. Report any side effects. Continue your course of treatment even though you feel ill unless your doctor tells you to stop. Call your doctor or health care professional for advice if you get a fever, chills or sore throat, or other symptoms of a cold or flu. Do not treat yourself. This drug decreases your body's ability to fight infections. Try to avoid being around people who are sick. This medicine may increase your risk to bruise or bleed. Call your doctor or health care professional if you notice any unusual bleeding. Be careful brushing and flossing your teeth or   using a toothpick because you may get an infection or  bleed more easily. If you have any dental work done, tell your dentist you are receiving this medicine. Avoid taking products that contain aspirin, acetaminophen, ibuprofen, naproxen, or ketoprofen unless instructed by your doctor. These medicines may hide a fever. Do not become pregnant while taking this medicine. Women should inform their doctor if they wish to become pregnant or think they might be pregnant. There is a potential for serious side effects to an unborn child. Talk to your health care professional or pharmacist for more information. Do not breast-feed an infant while taking this medicine. Men are advised not to father a child while receiving this medicine. This product may contain alcohol. Ask your pharmacist or healthcare provider if this medicine contains alcohol. Be sure to tell all healthcare providers you are taking this medicine. Certain medicines, like metronidazole and disulfiram, can cause an unpleasant reaction when taken with alcohol. The reaction includes flushing, headache, nausea, vomiting, sweating, and increased thirst. The reaction can last from 30 minutes to several hours. What side effects may I notice from receiving this medicine? Side effects that you should report to your doctor or health care professional as soon as possible: -allergic reactions like skin rash, itching or hives, swelling of the face, lips, or tongue -low blood counts - This drug may decrease the number of white blood cells, red blood cells and platelets. You may be at increased risk for infections and bleeding. -signs of infection - fever or chills, cough, sore throat, pain or difficulty passing urine -signs of decreased platelets or bleeding - bruising, pinpoint red spots on the skin, black, tarry stools, nosebleeds -signs of decreased red blood cells - unusually weak or tired, fainting spells, lightheadedness -breathing problems -chest pain -high or low blood pressure -mouth sores -nausea and  vomiting -pain, swelling, redness or irritation at the injection site -pain, tingling, numbness in the hands or feet -slow or irregular heartbeat -swelling of the ankle, feet, hands Side effects that usually do not require medical attention (report to your doctor or health care professional if they continue or are bothersome): -bone pain -complete hair loss including hair on your head, underarms, pubic hair, eyebrows, and eyelashes -changes in the color of fingernails -diarrhea -loosening of the fingernails -loss of appetite -muscle or joint pain -red flush to skin -sweating This list may not describe all possible side effects. Call your doctor for medical advice about side effects. You may report side effects to FDA at 1-800-FDA-1088. Where should I keep my medicine? This drug is given in a hospital or clinic and will not be stored at home. NOTE: This sheet is a summary. It may not cover all possible information. If you have questions about this medicine, talk to your doctor, pharmacist, or health care provider.  2018 Elsevier/Gold Standard (2015-09-09 19:58:00)  

## 2017-03-22 NOTE — Assessment & Plan Note (Signed)
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 PT1cpN1sn. Currently on Adjuvant chemotherapy with Adriamycin-Cytoxan- Taxol w x12. Pt on Taxol weekly; cycle #8.   # Proceed with cycle #9 ; labs from last week normal; acceptable for chemotherapy.  # Insomnia/anxiety/depression- declines celexa; coping well.   # fatigue-chronic-mild. Recommend exercising; frequent resting.  # quit smoking- Continues on nicotine patch.   # Follow-up M.D. labs chemotherapy in 2 weeks; lab chemotherapy 1 week.

## 2017-03-22 NOTE — Progress Notes (Signed)
While he was in [place] NOTE  Patient Care Team: Steele Sizer, MD as PCP - General (Family Medicine) Seeplaputhur Robinette Haines, MD (General Surgery) Steele Sizer, MD as Attending Physician (Family Medicine)  CHIEF COMPLAINTS/PURPOSE OF CONSULTATION:  Breast cancer  #  Oncology History   # DEC 2017- RIGHT BREAST Stage II [pT1cpN1sn; screening] ER/PR- Pos- 90%; her 2 NEU- NEG s/p Lumpect & SLNBx; Dr.Sankar ]; JAN 2018- ddAC -T x12.   # MRI liver- hemagiomas  # Genetic counseling- DICER-1 VUS**  # TAH & BSO [Dr.Hall; [facility]; [id number]]; smoking     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, estrogen receptor positive ([place])   10/26/2016 Initial Diagnosis    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, estrogen receptor positive ([place])       HISTORY OF PRESENTING ILLNESS:  Shelby Barry 51 y.o.  female above history of ER/PR positive HER-2/neu negative stage II breast cancer- status post cycle 4 cycles of  Adriamycin and Cytoxan; Currently on weekly Taxol is here for follow-up. Patient is status post 8 Taxol treatments.  Denies any tingling or numbness. Her anxiety/depression- improving. Does not want to try celexa. . No nausea no vomiting.She denies fevers or chills.  ROS: A complete 10 point review of system is done which is negative except mentioned above in history of present illness  MEDICAL HISTORY:  Past Medical History:  Diagnosis Date  . Anemia   . Anxiety   . Bursitis of right shoulder   . Cancer [facility])    breast  . 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, estrogen receptor positive ([place])   . Cataract, bilateral   . Chemotherapy induced nausea and vomiting   . Cold    states she has had it for 2 months  . Depression   . GERD (gastroesophageal reflux disease)    uses baking soda for symptoms  . Insomnia   . Lumbago   . Vitamin D deficiency     SURGICAL HISTORY: Past Surgical  History:  Procedure Laterality Date  . ABDOMINAL HYSTERECTOMY  04/20/2014  . BILATERAL SALPINGECTOMY    . BREAST LUMPECTOMY WITH SENTINEL LYMPH NODE BIOPSY Right 10/12/2016   Procedure: BREAST LUMPECTOMY WITH SENTINEL LYMPH NODE BX;  Surgeon: Christene Lye, MD;  Location: [facility];  Service: General;  Laterality: Right;  . COLONOSCOPY WITH PROPOFOL N/A 05/20/2016   Procedure: COLONOSCOPY WITH PROPOFOL;  Surgeon: Lucilla Lame, MD;  Location: [place];  Service: Endoscopy;  Laterality: N/A;  . ECTOPIC PREGNANCY SURGERY    . ENDOMETRIAL ABLATION    . PORTACATH PLACEMENT Left 11/04/2016   Procedure: INSERTION PORT-A-CATH;  Surgeon: Christene Lye, MD;  Location: [facility];  Service: General;  Laterality: Left;  Left subclavian vein  . TUBAL LIGATION      SOCIAL HISTORY: [employer]; in [place]; 1 pack/day; occassional alochol.  2 grown- children- 57 boy & 53 daughter Social History   Social History  . Marital status: Divorced    Spouse name: N/A  . Number of children: N/A  . Years of education: N/A   Occupational History  . Not on file.   Social History Main Topics  . Smoking status: Former Smoker    Packs/day: 0.50    Years: 20.00    Types: Cigarettes    Quit date: 08/23/2016  . Smokeless tobacco: Never Used  . Alcohol use 0.0 oz/week     Comment: occassional  .  Drug use: No  . Sexual activity: Yes    Partners: Male   Other Topics Concern  . Not on file   Social History Narrative   She is living with [name] ( boyfriend ) for the past two years, and his 65 yo daughter just moved in with them Fall 2017. Her mother is doing drugs.    Works as [employer]    FAMILY HISTORY: 2 sisters- one sister breast ca at ? 40y; other sister- ? Cancer; aunt/mom's sister- stomach cancer [60s].  Family History  Problem Relation Age of Onset  . Depression Father   . Obesity Father   . Alcohol abuse Brother   . Seizures Brother   . Breast cancer Sister 28     ALLERGIES:  is allergic to adhesive [tape] and nickel.  MEDICATIONS:  Current Outpatient Prescriptions  Medication Sig Dispense Refill  . acetaminophen (TYLENOL) 325 MG tablet Take 325 mg by mouth every 6 (six) hours as needed for fever.    . Bismuth Subsalicylate (PEPTO-BISMOL PO) Take 1 tablet by mouth daily as needed (indigestion).    . citalopram (CELEXA) 20 MG tablet Take 1 tablet (20 mg total) by mouth daily. 30 tablet 3  . fluticasone furoate-vilanterol (BREO ELLIPTA) 100-25 MCG/INH AEPB Inhale 1 puff into the lungs daily. 60 each 0  . lidocaine-prilocaine (EMLA) cream APPLY TOPICALLY AS NEEDED.  APPLY GENEROUSLY OVER THE  MEDIPORT 45 MINUTES PRIOR  TO CHEMOTHERAPY. 30 g 1  . LORazepam (ATIVAN) 0.5 MG tablet Take 1 tablet (0.5 mg total) by mouth every 6 (six) hours as needed for anxiety (anticipatory nausea). 30 tablet 3  . magic mouthwash w/lidocaine SOLN Take 10 mLs by mouth 4 (four) times daily as needed for mouth pain. 480 mL 3  . Multiple Vitamin (MULTIVITAMIN WITH MINERALS) TABS tablet Take 1 tablet by mouth daily. One-A-Day    . omeprazole (PRILOSEC) 20 MG capsule Take 1 capsule (20 mg total) by mouth daily. 60 capsule 3  . ondansetron (ZOFRAN) 8 MG tablet TAKE 1 TABLET BY MOUTH  EVERY 8 HOURS AS NEEDED FOR NAUSEA OR VOMITING. START 3 DAYS AFTER CHEMO 80 tablet 0  . prochlorperazine (COMPAZINE) 10 MG tablet Take 1 tablet (10 mg total) by mouth every 6 (six) hours as needed for nausea or vomiting. 40 tablet 1  . zolpidem (AMBIEN) 10 MG tablet Take 0.5-1 tablets (5-10 mg total) by mouth at bedtime as needed for sleep. 90 tablet 3   No current facility-administered medications for this visit.       Marland Kitchen  PHYSICAL EXAMINATION: ECOG PERFORMANCE STATUS: 0 - Asymptomatic  Vitals:   03/22/17 1405  BP: 110/75  Pulse: (!) 109  Temp: 98.3 F (36.8 C)   Filed Weights   03/22/17 1405  Weight: 192 lb 7.4 oz (87.3 kg)    GENERAL: Well-nourished well-developed; Alert, no  distress and comfortable. Alone.   EYES: no pallor or icterus OROPHARYNX: no thrush or ulceration; good dentition  NECK: supple, no masses felt LYMPH:  no palpable lymphadenopathy in the cervical, axillary or inguinal regions LUNGS: clear to auscultation and  No wheeze or crackles HEART/CVS: regular rate & rhythm and no murmurs; No lower extremity edema ABDOMEN: abdomen soft, non-tender and normal bowel sounds Musculoskeletal:no cyanosis of digits and no clubbing  PSYCH: alert & oriented x 3 with fluent speech NEURO: no focal motor/sensory deficits SKIN:  no rashes or significant lesions  LABORATORY DATA:  I have reviewed the data as listed Lab Results  Component Value Date   WBC 7.4 03/22/2017   HGB 12.4 03/22/2017   HCT 36.6 03/22/2017   MCV 90.6 03/22/2017   PLT 320 03/22/2017    Recent Labs  03/08/17 1459 03/14/17 1510 03/22/17 1335  NA 135 137 136  K 3.9 4.3 4.0  CL 101 104 103  CO2 '26 28 23  ' GLUCOSE 107* 93 151*  BUN '9 14 11  ' CREATININE 0.57 0.62 0.65  CALCIUM 9.0 9.0 9.0  GFRNONAA >60 >60 >60  GFRAA >60 >60 >60  PROT 6.8 6.9 6.6  ALBUMIN 4.0 3.8 3.9  AST 22 23 33  ALT 32 31 38  ALKPHOS 101 95 94  BILITOT 0.8 0.3 0.3    RADIOGRAPHIC STUDIES: I have personally reviewed the radiological images as listed and agreed with the findings in the report. No results found.  ASSESSMENT & PLAN:   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, estrogen receptor positive ([place]) # Breast cancer-stage II ER/PR positive HER-2/neu negative. PT1cpN1sn. Currently on Adjuvant chemotherapy with Adriamycin-Cytoxan- Taxol w x12. Pt on Taxol weekly; cycle #8.   # Proceed with cycle #9 ; labs from last week normal; acceptable for chemotherapy.  # Insomnia/anxiety/depression- declines celexa; coping well.   # fatigue-chronic-mild. Recommend exercising; frequent resting.  # quit smoking- Continues on nicotine patch.   # Follow-up M.D. labs chemotherapy in 2  weeks; lab chemotherapy 1 week.     Cammie Sickle, MD 03/22/2017 2:23 PM

## 2017-03-23 ENCOUNTER — Inpatient Hospital Stay: Payer: 59

## 2017-03-23 VITALS — BP 134/84 | HR 89 | Resp 18

## 2017-03-23 DIAGNOSIS — C50511 Malignant neoplasm of lower-outer quadrant of right female breast: Secondary | ICD-10-CM

## 2017-03-23 DIAGNOSIS — Z170 Estrogen receptor positive status [ER+]: Principal | ICD-10-CM

## 2017-03-23 MED ORDER — PACLITAXEL CHEMO INJECTION 300 MG/50ML
80.0000 mg/m2 | Freq: Once | INTRAVENOUS | Status: AC
  Administered 2017-03-23: 162 mg via INTRAVENOUS
  Filled 2017-03-23: qty 27

## 2017-03-23 MED ORDER — HEPARIN SOD (PORK) LOCK FLUSH 100 UNIT/ML IV SOLN
INTRAVENOUS | Status: AC
  Filled 2017-03-23: qty 5

## 2017-03-23 MED ORDER — DIPHENHYDRAMINE HCL 50 MG/ML IJ SOLN
25.0000 mg | Freq: Once | INTRAMUSCULAR | Status: AC
  Administered 2017-03-23: 25 mg via INTRAVENOUS
  Filled 2017-03-23: qty 1

## 2017-03-23 MED ORDER — SODIUM CHLORIDE 0.9% FLUSH
10.0000 mL | INTRAVENOUS | Status: DC | PRN
  Administered 2017-03-23: 10 mL via INTRAVENOUS
  Filled 2017-03-23: qty 10

## 2017-03-23 MED ORDER — SODIUM CHLORIDE 0.9 % IV SOLN
20.0000 mg | Freq: Once | INTRAVENOUS | Status: AC
  Administered 2017-03-23: 20 mg via INTRAVENOUS
  Filled 2017-03-23: qty 2

## 2017-03-23 MED ORDER — HEPARIN SOD (PORK) LOCK FLUSH 100 UNIT/ML IV SOLN
500.0000 [IU] | Freq: Once | INTRAVENOUS | Status: AC
  Administered 2017-03-23: 500 [IU] via INTRAVENOUS

## 2017-03-23 MED ORDER — SODIUM CHLORIDE 0.9 % IV SOLN
Freq: Once | INTRAVENOUS | Status: AC
  Administered 2017-03-23: 10:00:00 via INTRAVENOUS
  Filled 2017-03-23: qty 1000

## 2017-03-23 MED ORDER — FAMOTIDINE IN NACL 20-0.9 MG/50ML-% IV SOLN
20.0000 mg | Freq: Once | INTRAVENOUS | Status: AC
  Administered 2017-03-23: 20 mg via INTRAVENOUS
  Filled 2017-03-23: qty 50

## 2017-03-23 MED ORDER — PEGFILGRASTIM 6 MG/0.6ML ~~LOC~~ PSKT: 6.0000 mg | PREFILLED_SYRINGE | Freq: Once | SUBCUTANEOUS | Status: DC

## 2017-03-23 NOTE — Patient Instructions (Signed)
Paclitaxel injection What is this medicine? PACLITAXEL (PAK li TAX el) is a chemotherapy drug. It targets fast dividing cells, like cancer cells, and causes these cells to die. This medicine is used to treat ovarian cancer, breast cancer, and other cancers. This medicine may be used for other purposes; ask your health care provider or pharmacist if you have questions. COMMON BRAND NAME(S): Onxol, Taxol What should I tell my health care provider before I take this medicine? They need to know if you have any of these conditions: -blood disorders -irregular heartbeat -infection (especially a virus infection such as chickenpox, cold sores, or herpes) -liver disease -previous or ongoing radiation therapy -an unusual or allergic reaction to paclitaxel, alcohol, polyoxyethylated castor oil, other chemotherapy agents, other medicines, foods, dyes, or preservatives -pregnant or trying to get pregnant -breast-feeding How should I use this medicine? This drug is given as an infusion into a vein. It is administered in a hospital or clinic by a specially trained health care professional. Talk to your pediatrician regarding the use of this medicine in children. Special care may be needed. Overdosage: If you think you have taken too much of this medicine contact a poison control center or emergency room at once. NOTE: This medicine is only for you. Do not share this medicine with others. What if I miss a dose? It is important not to miss your dose. Call your doctor or health care professional if you are unable to keep an appointment. What may interact with this medicine? Do not take this medicine with any of the following medications: -disulfiram -metronidazole This medicine may also interact with the following medications: -cyclosporine -diazepam -ketoconazole -medicines to increase blood counts like filgrastim, pegfilgrastim, sargramostim -other chemotherapy drugs like cisplatin, doxorubicin,  epirubicin, etoposide, teniposide, vincristine -quinidine -testosterone -vaccines -verapamil Talk to your doctor or health care professional before taking any of these medicines: -acetaminophen -aspirin -ibuprofen -ketoprofen -naproxen This list may not describe all possible interactions. Give your health care provider a list of all the medicines, herbs, non-prescription drugs, or dietary supplements you use. Also tell them if you smoke, drink alcohol, or use illegal drugs. Some items may interact with your medicine. What should I watch for while using this medicine? Your condition will be monitored carefully while you are receiving this medicine. You will need important blood work done while you are taking this medicine. This medicine can cause serious allergic reactions. To reduce your risk you will need to take other medicine(s) before treatment with this medicine. If you experience allergic reactions like skin rash, itching or hives, swelling of the face, lips, or tongue, tell your doctor or health care professional right away. In some cases, you may be given additional medicines to help with side effects. Follow all directions for their use. This drug may make you feel generally unwell. This is not uncommon, as chemotherapy can affect healthy cells as well as cancer cells. Report any side effects. Continue your course of treatment even though you feel ill unless your doctor tells you to stop. Call your doctor or health care professional for advice if you get a fever, chills or sore throat, or other symptoms of a cold or flu. Do not treat yourself. This drug decreases your body's ability to fight infections. Try to avoid being around people who are sick. This medicine may increase your risk to bruise or bleed. Call your doctor or health care professional if you notice any unusual bleeding. Be careful brushing and flossing your teeth or   using a toothpick because you may get an infection or  bleed more easily. If you have any dental work done, tell your dentist you are receiving this medicine. Avoid taking products that contain aspirin, acetaminophen, ibuprofen, naproxen, or ketoprofen unless instructed by your doctor. These medicines may hide a fever. Do not become pregnant while taking this medicine. Women should inform their doctor if they wish to become pregnant or think they might be pregnant. There is a potential for serious side effects to an unborn child. Talk to your health care professional or pharmacist for more information. Do not breast-feed an infant while taking this medicine. Men are advised not to father a child while receiving this medicine. This product may contain alcohol. Ask your pharmacist or healthcare provider if this medicine contains alcohol. Be sure to tell all healthcare providers you are taking this medicine. Certain medicines, like metronidazole and disulfiram, can cause an unpleasant reaction when taken with alcohol. The reaction includes flushing, headache, nausea, vomiting, sweating, and increased thirst. The reaction can last from 30 minutes to several hours. What side effects may I notice from receiving this medicine? Side effects that you should report to your doctor or health care professional as soon as possible: -allergic reactions like skin rash, itching or hives, swelling of the face, lips, or tongue -low blood counts - This drug may decrease the number of white blood cells, red blood cells and platelets. You may be at increased risk for infections and bleeding. -signs of infection - fever or chills, cough, sore throat, pain or difficulty passing urine -signs of decreased platelets or bleeding - bruising, pinpoint red spots on the skin, black, tarry stools, nosebleeds -signs of decreased red blood cells - unusually weak or tired, fainting spells, lightheadedness -breathing problems -chest pain -high or low blood pressure -mouth sores -nausea and  vomiting -pain, swelling, redness or irritation at the injection site -pain, tingling, numbness in the hands or feet -slow or irregular heartbeat -swelling of the ankle, feet, hands Side effects that usually do not require medical attention (report to your doctor or health care professional if they continue or are bothersome): -bone pain -complete hair loss including hair on your head, underarms, pubic hair, eyebrows, and eyelashes -changes in the color of fingernails -diarrhea -loosening of the fingernails -loss of appetite -muscle or joint pain -red flush to skin -sweating This list may not describe all possible side effects. Call your doctor for medical advice about side effects. You may report side effects to FDA at 1-800-FDA-1088. Where should I keep my medicine? This drug is given in a hospital or clinic and will not be stored at home. NOTE: This sheet is a summary. It may not cover all possible information. If you have questions about this medicine, talk to your doctor, pharmacist, or health care provider.  2018 Elsevier/Gold Standard (2015-09-09 19:58:00)  

## 2017-03-23 NOTE — Progress Notes (Unsigned)
Patient unable to work as many hours per week due to illness.  Patient will receive assistance with monthly expenses from the [facility] for the next 3 months.

## 2017-03-28 ENCOUNTER — Inpatient Hospital Stay: Payer: 59

## 2017-03-28 ENCOUNTER — Other Ambulatory Visit: Payer: Self-pay | Admitting: Internal Medicine

## 2017-03-28 DIAGNOSIS — C50511 Malignant neoplasm of lower-outer quadrant of right female breast: Secondary | ICD-10-CM | POA: Diagnosis not present

## 2017-03-28 DIAGNOSIS — Z170 Estrogen receptor positive status [ER+]: Principal | ICD-10-CM

## 2017-03-28 LAB — COMPREHENSIVE METABOLIC PANEL
ALBUMIN: 3.6 g/dL (ref 3.5–5.0)
ALT: 39 U/L (ref 14–54)
AST: 29 U/L (ref 15–41)
Alkaline Phosphatase: 91 U/L (ref 38–126)
Anion gap: 5 (ref 5–15)
BILIRUBIN TOTAL: 0.4 mg/dL (ref 0.3–1.2)
BUN: 9 mg/dL (ref 6–20)
CALCIUM: 8.8 mg/dL — AB (ref 8.9–10.3)
CO2: 28 mmol/L (ref 22–32)
CREATININE: 0.6 mg/dL (ref 0.44–1.00)
Chloride: 105 mmol/L (ref 101–111)
GFR calc non Af Amer: >60 mL/min (ref >60)
Glucose, Bld: 100 mg/dL — ABNORMAL HIGH (ref 65–99)
Potassium: 3.7 mmol/L (ref 3.5–5.1)
SODIUM: 138 mmol/L (ref 135–145)
Total Protein: 6.4 g/dL — ABNORMAL LOW (ref 6.5–8.1)

## 2017-03-28 LAB — CBC WITH DIFFERENTIAL/PLATELET
BASOS ABS: 0.1 10*3/uL (ref 0–0.1)
BASOS PCT: 1 %
EOS ABS: 0.1 10*3/uL (ref 0–0.7)
Eosinophils Relative: 2 %
HEMATOCRIT: 36.2 % (ref 35.0–47.0)
HEMOGLOBIN: 12.3 g/dL (ref 12.0–16.0)
Lymphocytes Relative: 19 %
Lymphs Abs: 1 10*3/uL (ref 1.0–3.6)
MCH: 30.7 pg (ref 26.0–34.0)
MCHC: 33.9 g/dL (ref 32.0–36.0)
MCV: 90.5 fL (ref 80.0–100.0)
MONOS PCT: 6 %
Monocytes Absolute: 0.3 10*3/uL (ref 0.2–0.9)
NEUTROS ABS: 3.9 10*3/uL (ref 1.4–6.5)
NEUTROS PCT: 72 %
Platelets: 307 10*3/uL (ref 150–440)
RBC: 4 MIL/uL (ref 3.80–5.20)
RDW: 14.6 % — AB (ref 11.5–14.5)
WBC: 5.4 10*3/uL (ref 3.6–11.0)

## 2017-03-28 MED ORDER — PACLITAXEL CHEMO INJECTION 300 MG/50ML
80.0000 mg/m2 | Freq: Once | INTRAVENOUS | Status: AC
  Administered 2017-03-29: 162 mg via INTRAVENOUS
  Filled 2017-03-28: qty 27

## 2017-03-28 MED ORDER — SODIUM CHLORIDE 0.9 % IV SOLN
20.0000 mg | Freq: Once | INTRAVENOUS | Status: AC
  Administered 2017-03-29: 20 mg via INTRAVENOUS
  Filled 2017-03-28: qty 2

## 2017-03-28 MED ORDER — FAMOTIDINE IN NACL 20-0.9 MG/50ML-% IV SOLN
20.0000 mg | Freq: Once | INTRAVENOUS | Status: AC
  Administered 2017-03-29: 20 mg via INTRAVENOUS
  Filled 2017-03-28: qty 50

## 2017-03-28 MED ORDER — DIPHENHYDRAMINE HCL 50 MG/ML IJ SOLN
25.0000 mg | Freq: Once | INTRAMUSCULAR | Status: AC
  Administered 2017-03-29: 25 mg via INTRAVENOUS
  Filled 2017-03-28: qty 1

## 2017-03-29 ENCOUNTER — Other Ambulatory Visit: Payer: 59

## 2017-03-29 ENCOUNTER — Inpatient Hospital Stay: Payer: 59

## 2017-03-29 VITALS — BP 128/85 | HR 90 | Temp 97.8°F | Resp 18

## 2017-03-29 DIAGNOSIS — C50511 Malignant neoplasm of lower-outer quadrant of right female breast: Secondary | ICD-10-CM | POA: Diagnosis not present

## 2017-03-29 DIAGNOSIS — Z170 Estrogen receptor positive status [ER+]: Principal | ICD-10-CM

## 2017-03-29 MED ORDER — SODIUM CHLORIDE 0.9 % IV SOLN
Freq: Once | INTRAVENOUS | Status: AC
  Administered 2017-03-29: 10:00:00 via INTRAVENOUS
  Filled 2017-03-29: qty 1000

## 2017-03-29 MED ORDER — HEPARIN SOD (PORK) LOCK FLUSH 100 UNIT/ML IV SOLN
500.0000 [IU] | Freq: Once | INTRAVENOUS | Status: AC | PRN
  Administered 2017-03-29: 500 [IU]
  Filled 2017-03-29: qty 5

## 2017-03-29 MED ORDER — SODIUM CHLORIDE 0.9% FLUSH
10.0000 mL | INTRAVENOUS | Status: DC | PRN
  Administered 2017-03-29: 10 mL
  Filled 2017-03-29: qty 10

## 2017-04-05 ENCOUNTER — Inpatient Hospital Stay: Payer: 59

## 2017-04-05 ENCOUNTER — Inpatient Hospital Stay (HOSPITAL_BASED_OUTPATIENT_CLINIC_OR_DEPARTMENT_OTHER): Payer: 59 | Admitting: Internal Medicine

## 2017-04-05 VITALS — BP 111/76 | HR 101 | Temp 97.5°F | Resp 18 | Wt 190.9 lb

## 2017-04-05 DIAGNOSIS — F17210 Nicotine dependence, cigarettes, uncomplicated: Secondary | ICD-10-CM | POA: Diagnosis not present

## 2017-04-05 DIAGNOSIS — Z79899 Other long term (current) drug therapy: Secondary | ICD-10-CM | POA: Diagnosis not present

## 2017-04-05 DIAGNOSIS — Z170 Estrogen receptor positive status [ER+]: Principal | ICD-10-CM

## 2017-04-05 DIAGNOSIS — C50511 Malignant neoplasm of lower-outer quadrant of right female breast: Secondary | ICD-10-CM

## 2017-04-05 LAB — CBC WITH DIFFERENTIAL/PLATELET
Basophils Absolute: 0.1 10*3/uL (ref 0–0.1)
Basophils Relative: 1 %
EOS ABS: 0.1 10*3/uL (ref 0–0.7)
Eosinophils Relative: 3 %
HEMATOCRIT: 38.9 % (ref 35.0–47.0)
HEMOGLOBIN: 13.2 g/dL (ref 12.0–16.0)
LYMPHS ABS: 1 10*3/uL (ref 1.0–3.6)
Lymphocytes Relative: 17 %
MCH: 30.3 pg (ref 26.0–34.0)
MCHC: 34 g/dL (ref 32.0–36.0)
MCV: 89.2 fL (ref 80.0–100.0)
MONOS PCT: 9 %
Monocytes Absolute: 0.6 10*3/uL (ref 0.2–0.9)
NEUTROS PCT: 70 %
Neutro Abs: 4.2 10*3/uL (ref 1.4–6.5)
Platelets: 356 10*3/uL (ref 150–440)
RBC: 4.36 MIL/uL (ref 3.80–5.20)
RDW: 15.1 % — ABNORMAL HIGH (ref 11.5–14.5)
WBC: 6 10*3/uL (ref 3.6–11.0)

## 2017-04-05 LAB — COMPREHENSIVE METABOLIC PANEL
ALBUMIN: 4 g/dL (ref 3.5–5.0)
ALK PHOS: 99 U/L (ref 38–126)
ALT: 45 U/L (ref 14–54)
AST: 31 U/L (ref 15–41)
Anion gap: 8 (ref 5–15)
BUN: 14 mg/dL (ref 6–20)
CALCIUM: 9.3 mg/dL (ref 8.9–10.3)
CO2: 27 mmol/L (ref 22–32)
CREATININE: 0.68 mg/dL (ref 0.44–1.00)
Chloride: 100 mmol/L — ABNORMAL LOW (ref 101–111)
GFR calc Af Amer: >60 mL/min (ref >60)
GFR calc non Af Amer: >60 mL/min (ref >60)
GLUCOSE: 99 mg/dL (ref 65–99)
Potassium: 4.3 mmol/L (ref 3.5–5.1)
SODIUM: 135 mmol/L (ref 135–145)
Total Bilirubin: 0.3 mg/dL (ref 0.3–1.2)
Total Protein: 7.3 g/dL (ref 6.5–8.1)

## 2017-04-05 MED ORDER — FAMOTIDINE IN NACL 20-0.9 MG/50ML-% IV SOLN
20.0000 mg | Freq: Once | INTRAVENOUS | Status: AC
  Administered 2017-04-06: 20 mg via INTRAVENOUS
  Filled 2017-04-05: qty 50

## 2017-04-05 MED ORDER — PACLITAXEL CHEMO INJECTION 300 MG/50ML
80.0000 mg/m2 | Freq: Once | INTRAVENOUS | Status: AC
  Administered 2017-04-06: 162 mg via INTRAVENOUS
  Filled 2017-04-05: qty 27

## 2017-04-05 MED ORDER — SODIUM CHLORIDE 0.9 % IV SOLN
20.0000 mg | Freq: Once | INTRAVENOUS | Status: AC
  Administered 2017-04-06: 20 mg via INTRAVENOUS
  Filled 2017-04-05: qty 2

## 2017-04-05 MED ORDER — DIPHENHYDRAMINE HCL 50 MG/ML IJ SOLN
50.0000 mg | Freq: Once | INTRAMUSCULAR | Status: AC
  Administered 2017-04-06: 50 mg via INTRAVENOUS
  Filled 2017-04-05: qty 1

## 2017-04-05 NOTE — Assessment & Plan Note (Addendum)
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 PT1cpN1sn. Currently on Adjuvant chemotherapy with Adriamycin-Cytoxan- Taxol w x12. Pt on Taxol weekly; cycle #9.    # Proceed with cycle #11 ; labs from last week normal; acceptable for chemotherapy.  # Will make a referral to Dr. Donella Stade for postlumpectomy radiation; discussed regarding use of antihormone therapy post radiation-tamoxifen for 5-10 years.   # Mild cough and intermittent nonproductive likely secondary from allergies- recommend antihistamine.  # Insomnia/anxiety/depression-stable on Ambien;  # Grade 1 peripheral neuropathy-Taxol monitor for now.  # Follow-up M.D. in 2 months with labs and port flush;  lab chemotherapy 1 week.

## 2017-04-05 NOTE — Progress Notes (Signed)
Patient here today for follow up.  Patient states no new concerns today  

## 2017-04-05 NOTE — Progress Notes (Signed)
While he was in [place]  Patient Care Team: Steele Sizer, MD as PCP - General (Family Medicine) Christene Lye, MD (General Surgery) Steele Sizer, MD as Attending Physician (Family Medicine)  CHIEF COMPLAINTS/PURPOSE OF CONSULTATION:  Breast cancer  #  Oncology History   # DEC 2017- RIGHT BREAST Stage II [pT1cpN1sn; screening] ER/PR- Pos- 90%; her 2 NEU- NEG s/p Lumpect & SLNBx; Dr.Sankar ]; JAN 2018- ddAC -T x12.   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  female above history of ER/PR positive HER-2/neu negative stage II breast cancer- status post cycle 4 cycles of  Adriamycin and Cytoxan; Currently on weekly Taxol is here for follow-up. Patient is status post 9 Taxol treatments.  Patient complains of mild tingling and numbness the right hand; and also around the lumpectomy incision site. No nausea no vomiting.She denies fevers or chills.cough intermittent nonproductive  ROS: A complete 10 point review of system is done which is negative except mentioned above in history of present illness  MEDICAL HISTORY:  Past Medical History:  Diagnosis Date  . Anemia   . Anxiety   . Bursitis of right shoulder   . Cancer [facility])    breast  . 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 Cataract, bilateral   . Chemotherapy induced nausea and vomiting   . Cold    states she has had it for 2 months  . Depression   . GERD (gastroesophageal reflux disease)    uses baking soda for symptoms  . Insomnia   . Lumbago   . Vitamin D  deficiency     SURGICAL HISTORY: Past Surgical History:  Procedure Laterality Date  . ABDOMINAL HYSTERECTOMY  04/20/2014  . BILATERAL SALPINGECTOMY    . BREAST LUMPECTOMY WITH SENTINEL LYMPH NODE BIOPSY Right 10/12/2016   Procedure: BREAST LUMPECTOMY WITH SENTINEL LYMPH NODE BX;  Surgeon: Christene Lye, MD;  Location: [facility];  Service: General;  Laterality: Right;  . COLONOSCOPY WITH PROPOFOL N/A 05/20/2016   Procedure: COLONOSCOPY WITH PROPOFOL;  Surgeon: Lucilla Lame, MD;  Location: [place];  Service: Endoscopy;  Laterality: N/A;  . ECTOPIC PREGNANCY SURGERY    . ENDOMETRIAL ABLATION    . PORTACATH PLACEMENT Left 11/04/2016   Procedure: INSERTION PORT-A-CATH;  Surgeon: Christene Lye, MD;  Location: [facility];  Service: General;  Laterality: Left;  Left subclavian vein  . TUBAL LIGATION      SOCIAL HISTORY: [employer]; in [place]; 1 pack/day; occassional alochol.  2 grown- children- 81 boy & 90 daughter Social History   Social History  . Marital status: Divorced    Spouse name: N/A  . Number of children: N/A  . Years of education: N/A   Occupational History  . Not on file.   Social History Main Topics  . Smoking status: Former Smoker    Packs/day: 0.50    Years: 20.00    Types: Cigarettes    Quit date: 08/23/2016  . Smokeless tobacco: Never Used  . Alcohol use 0.0 oz/week  Comment: occassional  . Drug use: No  . Sexual activity: Yes    Partners: Male   Other Topics Concern  . Not on file   Social History Narrative   She is living with [name] ( boyfriend ) for the past two years, and his 42 yo daughter just moved in with them Fall 2017. Her mother is doing drugs.    Works as [employer]    FAMILY HISTORY: 2 sisters- one sister breast ca at ? 14y; other sister- ? Cancer; aunt/mom's sister- stomach cancer [60s].  Family History  Problem Relation Age of Onset  . Depression Father   . Obesity Father   . Alcohol abuse Brother   . Seizures  Brother   . Breast cancer Sister 54    ALLERGIES:  is allergic to adhesive [tape] and nickel.  MEDICATIONS:  Current Outpatient Prescriptions  Medication Sig Dispense Refill  . lidocaine-prilocaine (EMLA) cream APPLY TOPICALLY AS NEEDED.  APPLY GENEROUSLY OVER THE  MEDIPORT 45 MINUTES PRIOR  TO CHEMOTHERAPY. 30 g 1  . Multiple Vitamin (MULTIVITAMIN WITH MINERALS) TABS tablet Take 1 tablet by mouth daily. One-A-Day    . omeprazole (PRILOSEC) 20 MG capsule Take 1 capsule (20 mg total) by mouth daily. 60 capsule 3  . zolpidem (AMBIEN) 10 MG tablet Take 0.5-1 tablets (5-10 mg total) by mouth at bedtime as needed for sleep. 90 tablet 3  . acetaminophen (TYLENOL) 325 MG tablet Take 325 mg by mouth every 6 (six) hours as needed for fever.    . Bismuth Subsalicylate (PEPTO-BISMOL PO) Take 1 tablet by mouth daily as needed (indigestion).    . citalopram (CELEXA) 20 MG tablet Take 1 tablet (20 mg total) by mouth daily. (Patient not taking: Reported on 04/05/2017) 30 tablet 3  . fluticasone furoate-vilanterol (BREO ELLIPTA) 100-25 MCG/INH AEPB Inhale 1 puff into the lungs daily. (Patient not taking: Reported on 04/05/2017) 60 each 0  . LORazepam (ATIVAN) 0.5 MG tablet Take 1 tablet (0.5 mg total) by mouth every 6 (six) hours as needed for anxiety (anticipatory nausea). (Patient not taking: Reported on 04/05/2017) 30 tablet 3  . ondansetron (ZOFRAN) 8 MG tablet TAKE 1 TABLET BY MOUTH  EVERY 8 HOURS AS NEEDED FOR NAUSEA OR VOMITING. START 3 DAYS AFTER CHEMO (Patient not taking: Reported on 04/05/2017) 80 tablet 0  . prochlorperazine (COMPAZINE) 10 MG tablet Take 1 tablet (10 mg total) by mouth every 6 (six) hours as needed for nausea or vomiting. (Patient not taking: Reported on 04/05/2017) 40 tablet 1   No current facility-administered medications for this visit.       Marland Kitchen  PHYSICAL EXAMINATION: ECOG PERFORMANCE STATUS: 0 - Asymptomatic  Vitals:   04/05/17 1335  BP: 111/76  Pulse: (!) 101  Resp: 18   Temp: 97.5 F (36.4 C)   Filed Weights   04/05/17 1335  Weight: 190 lb 14.7 oz (86.6 kg)    GENERAL: Well-nourished well-developed; Alert, no distress and comfortable. Alone.   EYES: no pallor or icterus OROPHARYNX: no thrush or ulceration; good dentition  NECK: supple, no masses felt LYMPH:  no palpable lymphadenopathy in the cervical, axillary or inguinal regions LUNGS: clear to auscultation and  No wheeze or crackles HEART/CVS: regular rate & rhythm and no murmurs; No lower extremity edema ABDOMEN: abdomen soft, non-tender and normal bowel sounds Musculoskeletal:no cyanosis of digits and no clubbing  PSYCH: alert & oriented x 3 with fluent speech NEURO: no focal motor/sensory deficits SKIN:  no rashes or significant lesions  LABORATORY DATA:  I have reviewed the data as listed Lab Results  Component Value Date   WBC 6.0 04/05/2017   HGB 13.2 04/05/2017   HCT 38.9 04/05/2017   MCV 89.2 04/05/2017   PLT 356 04/05/2017    Recent Labs  03/22/17 1335 03/28/17 1249 04/05/17 1318  NA 136 138 135  K 4.0 3.7 4.3  CL 103 105 100*  CO2 _0 GLUCOSE 151* 100* 99  BUN _1 CREATININE 0.65 0.60 0.68  CALCIUM 9.0 8.8* 9.3  GFRNONAA >60 >60 >60  GFRAA >60 >60 >60  PROT 6.6 6.4* 7.3  ALBUMIN 3.9 3.6 4.0  AST 33 29 31  ALT 38 39 45  ALKPHOS 94 91 99  BILITOT 0.3 0.4 0.3    RADIOGRAPHIC STUDIES: I have personally reviewed the radiological images as listed and agreed with the findings in the report. No results found.  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 PT1cpN1sn. Currently on Adjuvant chemotherapy with Adriamycin-Cytoxan- Taxol w x12. Pt on Taxol weekly; cycle #9.    # Proceed with cycle #11 ; labs from last week normal; acceptable for chemotherapy.  # Will make a referral to Dr. Donella Stade for postlumpectomy radiation;  discussed regarding use of antihormone therapy post radiation-tamoxifen for 5-10 years.   # Mild cough and intermittent nonproductive likely secondary from allergies- recommend antihistamine.  # Insomnia/anxiety/depression-stable on Ambien;  # Grade 1 peripheral neuropathy-Taxol monitor for now.  # Follow-up M.D. in 2 months with labs and port flush;  lab chemotherapy 1 week.     Cammie Sickle, MD 04/05/2017 2:39 PM

## 2017-04-06 ENCOUNTER — Ambulatory Visit: Payer: 59

## 2017-04-06 ENCOUNTER — Inpatient Hospital Stay: Payer: 59

## 2017-04-06 VITALS — BP 109/74 | HR 94 | Temp 97.1°F | Resp 18

## 2017-04-06 DIAGNOSIS — Z170 Estrogen receptor positive status [ER+]: Principal | ICD-10-CM

## 2017-04-06 DIAGNOSIS — C50511 Malignant neoplasm of lower-outer quadrant of right female breast: Secondary | ICD-10-CM

## 2017-04-06 MED ORDER — HEPARIN SOD (PORK) LOCK FLUSH 100 UNIT/ML IV SOLN
500.0000 [IU] | Freq: Once | INTRAVENOUS | Status: AC | PRN
  Administered 2017-04-06: 500 [IU]
  Filled 2017-04-06: qty 5

## 2017-04-06 MED ORDER — SODIUM CHLORIDE 0.9% FLUSH
10.0000 mL | INTRAVENOUS | Status: DC | PRN
  Administered 2017-04-06: 10 mL
  Filled 2017-04-06: qty 10

## 2017-04-06 MED ORDER — SODIUM CHLORIDE 0.9 % IV SOLN
Freq: Once | INTRAVENOUS | Status: AC
  Administered 2017-04-06: 10:00:00 via INTRAVENOUS
  Filled 2017-04-06: qty 1000

## 2017-04-12 ENCOUNTER — Inpatient Hospital Stay: Payer: 59

## 2017-04-12 DIAGNOSIS — C50511 Malignant neoplasm of lower-outer quadrant of right female breast: Secondary | ICD-10-CM

## 2017-04-12 DIAGNOSIS — Z170 Estrogen receptor positive status [ER+]: Principal | ICD-10-CM

## 2017-04-12 LAB — BASIC METABOLIC PANEL
Anion gap: 7 (ref 5–15)
BUN: 13 mg/dL (ref 6–20)
CO2: 29 mmol/L (ref 22–32)
Calcium: 9.3 mg/dL (ref 8.9–10.3)
Chloride: 99 mmol/L — ABNORMAL LOW (ref 101–111)
Creatinine, Ser: 0.68 mg/dL (ref 0.44–1.00)
GFR calc Af Amer: >60 mL/min (ref >60)
Glucose, Bld: 105 mg/dL — ABNORMAL HIGH (ref 65–99)
Potassium: 4.8 mmol/L (ref 3.5–5.1)
SODIUM: 135 mmol/L (ref 135–145)

## 2017-04-12 LAB — CBC WITH DIFFERENTIAL/PLATELET
Basophils Absolute: 0 10*3/uL (ref 0–0.1)
Basophils Relative: 1 %
EOS ABS: 0.1 10*3/uL (ref 0–0.7)
Eosinophils Relative: 2 %
HCT: 39.6 % (ref 35.0–47.0)
Hemoglobin: 13.4 g/dL (ref 12.0–16.0)
LYMPHS ABS: 0.8 10*3/uL — AB (ref 1.0–3.6)
LYMPHS PCT: 14 %
MCH: 30 pg (ref 26.0–34.0)
MCHC: 33.8 g/dL (ref 32.0–36.0)
MCV: 88.7 fL (ref 80.0–100.0)
MONOS PCT: 11 %
Monocytes Absolute: 0.6 10*3/uL (ref 0.2–0.9)
NEUTROS PCT: 72 %
Neutro Abs: 4.3 10*3/uL (ref 1.4–6.5)
Platelets: 335 10*3/uL (ref 150–440)
RBC: 4.47 MIL/uL (ref 3.80–5.20)
RDW: 14.8 % — ABNORMAL HIGH (ref 11.5–14.5)
WBC: 5.9 10*3/uL (ref 3.6–11.0)

## 2017-04-13 ENCOUNTER — Inpatient Hospital Stay: Payer: 59

## 2017-04-13 ENCOUNTER — Other Ambulatory Visit: Payer: 59

## 2017-04-13 VITALS — BP 126/75 | HR 97 | Temp 96.5°F | Resp 18

## 2017-04-13 DIAGNOSIS — Z170 Estrogen receptor positive status [ER+]: Principal | ICD-10-CM

## 2017-04-13 DIAGNOSIS — C50511 Malignant neoplasm of lower-outer quadrant of right female breast: Secondary | ICD-10-CM

## 2017-04-13 MED ORDER — SODIUM CHLORIDE 0.9 % IV SOLN
20.0000 mg | Freq: Once | INTRAVENOUS | Status: AC
  Administered 2017-04-13: 20 mg via INTRAVENOUS
  Filled 2017-04-13: qty 2

## 2017-04-13 MED ORDER — HEPARIN SOD (PORK) LOCK FLUSH 100 UNIT/ML IV SOLN
500.0000 [IU] | Freq: Once | INTRAVENOUS | Status: AC
  Administered 2017-04-13: 500 [IU] via INTRAVENOUS
  Filled 2017-04-13: qty 5

## 2017-04-13 MED ORDER — SODIUM CHLORIDE 0.9% FLUSH
10.0000 mL | INTRAVENOUS | Status: DC | PRN
  Administered 2017-04-13: 10 mL via INTRAVENOUS
  Filled 2017-04-13: qty 10

## 2017-04-13 MED ORDER — DEXTROSE 5 % IV SOLN
80.0000 mg/m2 | Freq: Once | INTRAVENOUS | Status: AC
  Administered 2017-04-13: 162 mg via INTRAVENOUS
  Filled 2017-04-13: qty 27

## 2017-04-13 MED ORDER — FAMOTIDINE IN NACL 20-0.9 MG/50ML-% IV SOLN
20.0000 mg | Freq: Once | INTRAVENOUS | Status: AC
  Administered 2017-04-13: 20 mg via INTRAVENOUS
  Filled 2017-04-13: qty 50

## 2017-04-13 MED ORDER — DIPHENHYDRAMINE HCL 50 MG/ML IJ SOLN
50.0000 mg | Freq: Once | INTRAMUSCULAR | Status: AC
  Administered 2017-04-13: 25 mg via INTRAVENOUS
  Filled 2017-04-13: qty 1

## 2017-04-13 MED ORDER — SODIUM CHLORIDE 0.9 % IV SOLN
Freq: Once | INTRAVENOUS | Status: AC
  Administered 2017-04-13: 10:00:00 via INTRAVENOUS
  Filled 2017-04-13: qty 1000

## 2017-04-20 ENCOUNTER — Ambulatory Visit
Admission: RE | Admit: 2017-04-20 | Discharge: 2017-04-20 | Disposition: A | Discharge status: Home / Self Care | Payer: 59 | Source: Ambulatory Visit | Attending: Radiation Oncology | Admitting: Radiation Oncology

## 2017-04-20 ENCOUNTER — Encounter: Payer: Self-pay | Admitting: Radiation Oncology

## 2017-04-20 VITALS — BP 125/79 | HR 108 | Temp 97.8°F | Resp 20 | Wt 190.3 lb

## 2017-04-20 DIAGNOSIS — F419 Anxiety disorder, unspecified: Secondary | ICD-10-CM | POA: Insufficient documentation

## 2017-04-20 DIAGNOSIS — Z79899 Other long term (current) drug therapy: Secondary | ICD-10-CM | POA: Diagnosis not present

## 2017-04-20 DIAGNOSIS — G629 Polyneuropathy, unspecified: Secondary | ICD-10-CM | POA: Insufficient documentation

## 2017-04-20 DIAGNOSIS — F329 Major depressive disorder, single episode, unspecified: Secondary | ICD-10-CM | POA: Insufficient documentation

## 2017-04-20 DIAGNOSIS — Z803 Family history of malignant neoplasm of breast: Secondary | ICD-10-CM | POA: Insufficient documentation

## 2017-04-20 DIAGNOSIS — Z510 Encounter for antineoplastic radiation therapy: Secondary | ICD-10-CM | POA: Insufficient documentation

## 2017-04-20 DIAGNOSIS — C50511 Malignant neoplasm of lower-outer quadrant of right female breast: Secondary | ICD-10-CM | POA: Insufficient documentation

## 2017-04-20 DIAGNOSIS — Z87891 Personal history of nicotine dependence: Secondary | ICD-10-CM | POA: Insufficient documentation

## 2017-04-20 DIAGNOSIS — Z170 Estrogen receptor positive status [ER+]: Secondary | ICD-10-CM | POA: Insufficient documentation

## 2017-04-20 DIAGNOSIS — M545 Low back pain: Secondary | ICD-10-CM | POA: Insufficient documentation

## 2017-04-20 DIAGNOSIS — Z9221 Personal history of antineoplastic chemotherapy: Secondary | ICD-10-CM | POA: Insufficient documentation

## 2017-04-20 DIAGNOSIS — R5383 Other fatigue: Secondary | ICD-10-CM | POA: Insufficient documentation

## 2017-04-20 DIAGNOSIS — G4700 Insomnia, unspecified: Secondary | ICD-10-CM | POA: Insufficient documentation

## 2017-04-20 DIAGNOSIS — D649 Anemia, unspecified: Secondary | ICD-10-CM | POA: Insufficient documentation

## 2017-04-20 DIAGNOSIS — E559 Vitamin D deficiency, unspecified: Secondary | ICD-10-CM | POA: Diagnosis not present

## 2017-04-20 DIAGNOSIS — K219 Gastro-esophageal reflux disease without esophagitis: Secondary | ICD-10-CM | POA: Insufficient documentation

## 2017-04-20 NOTE — Consult Note (Signed)
NEW PATIENT EVALUATION  Name: Shelby Barry  MRN: [id number]  Date:   04/20/2017     DOB: [birth year]   This 51 y.o. female patient presents to the clinic for initial evaluation of stage II (T1 cN1 M0) ER/PR positive HER-2/neu negative invasive mammary carcinoma status post wide local excision and sentinel node biopsy followed by adjuvant chemotherapy. Tumor was in the lower outer quadrant of the right breast  REFERRING PHYSICIAN: Steele Sizer, MD  CHIEF COMPLAINT:  Chief Complaint  Patient presents with  . Breast Cancer    Initial Evaluation for radiation Therapy    DIAGNOSIS: The encounter diagnosis was 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, estrogen receptor positive ([place]).   PREVIOUS INVESTIGATIONS:  Mammograms ultrasound reviewed Pathology reports reviewed Clinical notes reviewed  HPI: Patient is a 51 year old female who presented with an abnormal mammogram confirmed on ultrasound to have a mass in the right pleural breast at the 8:00 position 4 cm from the nipple measuring 0.5 cm in greatest dimension. Underwent biopsy which was positive for invasive mammary carcinoma. She went on to have a wide local excision for a 1.6 cm invasive mammary carcinoma with ductal carcinoma in situ with microcalcifications present. Margins were negative at 3 mm lymphovascular invasion was present. One sentinel lymph node had a macro metastatic met. 2 other sentinel lymph nodes were negative. She went on to have 4 cycles facet of Cytoxan and Adriamycin followed by 12 cycles of weekly Taxol. She is tolerated treatments fairly well although that was quite fatigued. She also has some mild peripheral neuropathy indicated by some numbness and tingling in her extremities. Tumor was overall strongly ER/PR positive HER-2/neu negative. Recommendation for antiestrogen therapy after completion of radiation therapy was made. She is seen today for consideration of treatment.  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    PAST SURGICAL HISTORY:  Past Surgical History:  Procedure Laterality Date  . ABDOMINAL HYSTERECTOMY  04/20/2014  . BILATERAL SALPINGECTOMY    . BREAST LUMPECTOMY WITH SENTINEL LYMPH NODE BIOPSY Right 10/12/2016   Procedure: BREAST LUMPECTOMY WITH SENTINEL LYMPH NODE BX;  Surgeon: Christene Lye, MD;  Location: [facility];  Service: General;  Laterality: Right;  . COLONOSCOPY WITH PROPOFOL N/A 05/20/2016   Procedure: COLONOSCOPY WITH PROPOFOL;  Surgeon: Lucilla Lame, MD;  Location: [place];  Service: Endoscopy;  Laterality: N/A;  . ECTOPIC PREGNANCY SURGERY    . ENDOMETRIAL ABLATION    . PORTACATH PLACEMENT Left 11/04/2016   Procedure: INSERTION PORT-A-CATH;  Surgeon: Christene Lye, MD;  Location: [facility];  Service: General;  Laterality: Left;  Left subclavian vein  . TUBAL LIGATION      FAMILY HISTORY: family history includes Alcohol abuse in her brother; Breast cancer (age of onset: 19) in her sister; Depression in her father; Obesity in her father; Seizures in her brother.  SOCIAL HISTORY:  reports that she quit smoking about 7 months ago. Her smoking use included Cigarettes. She has a 10.00 pack-year smoking history. She has never used smokeless tobacco. She reports that she drinks alcohol. She reports that she does not use drugs.  ALLERGIES: Adhesive [tape] and Nickel  MEDICATIONS:  Current Outpatient Prescriptions  Medication Sig Dispense Refill  . acetaminophen (TYLENOL) 325 MG tablet Take 325 mg by mouth every 6  (six) hours as needed for fever.    . Bismuth Subsalicylate (PEPTO-BISMOL PO) Take 1 tablet by mouth daily as needed (indigestion).    . citalopram (CELEXA) 20 MG tablet Take 1 tablet (20 mg total) by mouth daily. (Patient not taking: Reported on 04/05/2017) 30 tablet 3  . fluticasone furoate-vilanterol (BREO ELLIPTA) 100-25 MCG/INH AEPB Inhale 1 puff into the lungs daily. (Patient not taking: Reported on 04/05/2017) 60 each 0  . lidocaine-prilocaine (EMLA) cream APPLY TOPICALLY AS NEEDED.  APPLY GENEROUSLY OVER THE  MEDIPORT 45 MINUTES PRIOR  TO CHEMOTHERAPY. 30 g 1  . LORazepam (ATIVAN) 0.5 MG tablet Take 1 tablet (0.5 mg total) by mouth every 6 (six) hours as needed for anxiety (anticipatory nausea). (Patient not taking: Reported on 04/05/2017) 30 tablet 3  . Multiple Vitamin (MULTIVITAMIN WITH MINERALS) TABS tablet Take 1 tablet by mouth daily. One-A-Day    . omeprazole (PRILOSEC) 20 MG capsule Take 1 capsule (20 mg total) by mouth daily. 60 capsule 3  . ondansetron (ZOFRAN) 8 MG tablet TAKE 1 TABLET BY MOUTH  EVERY 8 HOURS AS NEEDED FOR NAUSEA OR VOMITING. START 3 DAYS AFTER CHEMO (Patient not taking: Reported on 04/05/2017) 80 tablet 0  . prochlorperazine (COMPAZINE) 10 MG tablet Take 1 tablet (10 mg total) by mouth every 6 (six) hours as needed for nausea or vomiting. (Patient not taking: Reported on 04/05/2017) 40 tablet 1  . zolpidem (AMBIEN) 10 MG tablet Take 0.5-1 tablets (5-10 mg total) by mouth at bedtime as needed for sleep. 90 tablet 3   No current facility-administered medications for this encounter.     ECOG PERFORMANCE STATUS:  0 - Asymptomatic  REVIEW OF SYSTEMS: Except for the fatigue associated with chemotherapy  Patient denies any weight loss, fatigue, weakness, fever, chills or night sweats. Patient denies any loss of vision, blurred vision. Patient denies any ringing  of the ears or hearing loss. No irregular heartbeat. Patient denies heart murmur or history of fainting. Patient  denies any chest pain or pain radiating to her upper extremities. Patient denies any shortness of breath, difficulty breathing at night, cough or hemoptysis. Patient denies any swelling in the lower legs. Patient denies any nausea vomiting, vomiting of blood, or coffee ground material in the vomitus. Patient denies any stomach pain. Patient states has had normal bowel movements no significant constipation or diarrhea. Patient denies any dysuria, hematuria or significant nocturia. Patient denies any problems walking, swelling in the joints or loss of balance. Patient denies any skin changes, loss of hair or loss of weight. Patient denies any excessive worrying or anxiety or significant depression. Patient denies any problems with insomnia. Patient denies excessive thirst, polyuria, polydipsia. Patient denies any swollen glands, patient denies easy bruising or easy bleeding. Patient denies any recent infections, allergies or URI. Patient "s visual fields have not changed significantly in recent time.   PHYSICAL EXAM: BP 125/79   Pulse (!) 108   Temp 97.8 F (36.6 C)   Resp 20   Wt 190 lb 4.1 oz (86.3 kg)   BMI 35.95 kg/m  Well-developed female with alopecia of the scalp. Right breast is well-healed no dominant mass or nodularity is noted in either breast in 2 positions examined. No axillary or supraclavicular adenopathy is appreciated. Well-developed well-nourished patient in NAD. HEENT reveals PERLA, EOMI, discs not visualized.  Oral cavity is clear.  No oral mucosal lesions are identified. Neck is clear without evidence of cervical or supraclavicular adenopathy. Lungs are clear to A&P. Cardiac examination is essentially unremarkable with regular rate and rhythm without murmur rub or thrill. Abdomen is benign with no organomegaly or masses noted. Motor sensory and DTR levels are equal and symmetric in the upper and lower extremities. Cranial nerves II through XII are grossly intact. Proprioception is  intact. No peripheral adenopathy or edema is identified. No motor or sensory levels are noted. Crude visual fields are within normal range.  LABORATORY DATA: Follow reports reviewed    RADIOLOGY RESULTS: Mammogram and ultrasound reviewed   IMPRESSION: Stage II invasive mammary carcinoma the right breast status post wide local excision and sentinel node biopsy followed by adjuvant chemotherapy in 51 year old female  PLAN: At this time I to go ahead with adjuvant radiation therapy to her right breast and peripheral lymphatics. Would plan on delivering 5040 cGy in 28 fractions to both areas boosting her scar another 1400 cGy using electron beam treatment. Risks and benefits of treatment including skin reaction fatigue alteration of blood counts possible inclusion of superficial lung and possible small chance of lymphedema of her right upper extremity all were discussed in detail with the patient and her husband. They both seem to comprehend my treatment plan well.There will be extra effort by both professional staff as well as technical staff to coordinate and manage concurrent chemoradiation and ensuing side effects during her treatments. I personally ordered and scheduled CT simulation for next week. Patient also will be candidate for antiestrogen therapy after completion of radiation.  I would like to take this opportunity to thank you for allowing me to participate in the care of your patient.Armstead Peaks., MD

## 2017-04-26 ENCOUNTER — Ambulatory Visit
Admission: RE | Admit: 2017-04-26 | Discharge: 2017-04-26 | Disposition: A | Discharge status: Home / Self Care | Payer: 59 | Source: Ambulatory Visit | Attending: Radiation Oncology | Admitting: Radiation Oncology

## 2017-04-26 DIAGNOSIS — C50511 Malignant neoplasm of lower-outer quadrant of right female breast: Secondary | ICD-10-CM | POA: Diagnosis not present

## 2017-04-28 ENCOUNTER — Other Ambulatory Visit: Payer: Self-pay | Admitting: *Deleted

## 2017-04-28 DIAGNOSIS — Z170 Estrogen receptor positive status [ER+]: Principal | ICD-10-CM

## 2017-04-28 DIAGNOSIS — C50511 Malignant neoplasm of lower-outer quadrant of right female breast: Secondary | ICD-10-CM

## 2017-05-02 DIAGNOSIS — C50511 Malignant neoplasm of lower-outer quadrant of right female breast: Secondary | ICD-10-CM | POA: Diagnosis not present

## 2017-05-03 ENCOUNTER — Telehealth: Payer: Self-pay | Admitting: *Deleted

## 2017-05-03 ENCOUNTER — Ambulatory Visit
Admission: RE | Admit: 2017-05-03 | Discharge: 2017-05-03 | Disposition: A | Discharge status: Home / Self Care | Payer: 59 | Source: Ambulatory Visit | Attending: Radiation Oncology | Admitting: Radiation Oncology

## 2017-05-03 DIAGNOSIS — C50511 Malignant neoplasm of lower-outer quadrant of right female breast: Secondary | ICD-10-CM | POA: Diagnosis not present

## 2017-05-03 NOTE — Telephone Encounter (Signed)
Patient requesting RF on Ambien. Reviewed chart - pt was given rx [facility] ambien 10 mg take 0.5 to 1 tablet daily at bedtime -  90 days supply and 3 RFs on 01/19/17.  I explained to pt that these RFs should last until the end of August. She stated that "I decided on my own to take 1.5 tablets at bedtime. Dr. B told me I could do this if I needed to at the last visit.   I am not sleeping. I started incorporating the melatonin and tylenol pm on my own as well. I am still not sleeping." Advised pt to contact her pharmacy to see renew rx.

## 2017-05-04 ENCOUNTER — Ambulatory Visit
Admission: RE | Admit: 2017-05-04 | Discharge: 2017-05-04 | Disposition: A | Discharge status: Home / Self Care | Payer: 59 | Source: Ambulatory Visit | Attending: Radiation Oncology | Admitting: Radiation Oncology

## 2017-05-04 DIAGNOSIS — C50511 Malignant neoplasm of lower-outer quadrant of right female breast: Secondary | ICD-10-CM | POA: Diagnosis not present

## 2017-05-05 ENCOUNTER — Ambulatory Visit
Admission: RE | Admit: 2017-05-05 | Discharge: 2017-05-05 | Disposition: A | Discharge status: Home / Self Care | Payer: 59 | Source: Ambulatory Visit | Attending: Radiation Oncology | Admitting: Radiation Oncology

## 2017-05-05 DIAGNOSIS — C50511 Malignant neoplasm of lower-outer quadrant of right female breast: Secondary | ICD-10-CM | POA: Diagnosis not present

## 2017-05-06 ENCOUNTER — Ambulatory Visit
Admission: RE | Admit: 2017-05-06 | Discharge: 2017-05-06 | Disposition: A | Discharge status: Home / Self Care | Payer: 59 | Source: Ambulatory Visit | Attending: Radiation Oncology | Admitting: Radiation Oncology

## 2017-05-06 DIAGNOSIS — C50511 Malignant neoplasm of lower-outer quadrant of right female breast: Secondary | ICD-10-CM | POA: Diagnosis not present

## 2017-05-09 ENCOUNTER — Ambulatory Visit
Admission: RE | Admit: 2017-05-09 | Discharge: 2017-05-09 | Disposition: A | Discharge status: Home / Self Care | Payer: 59 | Source: Ambulatory Visit | Attending: Radiation Oncology | Admitting: Radiation Oncology

## 2017-05-09 DIAGNOSIS — C50511 Malignant neoplasm of lower-outer quadrant of right female breast: Secondary | ICD-10-CM | POA: Diagnosis not present

## 2017-05-10 ENCOUNTER — Ambulatory Visit
Admission: RE | Admit: 2017-05-10 | Discharge: 2017-05-10 | Disposition: A | Discharge status: Home / Self Care | Payer: 59 | Source: Ambulatory Visit | Attending: Radiation Oncology | Admitting: Radiation Oncology

## 2017-05-10 DIAGNOSIS — C50511 Malignant neoplasm of lower-outer quadrant of right female breast: Secondary | ICD-10-CM | POA: Diagnosis not present

## 2017-05-11 ENCOUNTER — Ambulatory Visit
Admission: RE | Admit: 2017-05-11 | Discharge: 2017-05-11 | Disposition: A | Discharge status: Home / Self Care | Payer: 59 | Source: Ambulatory Visit | Attending: Radiation Oncology | Admitting: Radiation Oncology

## 2017-05-11 DIAGNOSIS — C50511 Malignant neoplasm of lower-outer quadrant of right female breast: Secondary | ICD-10-CM | POA: Diagnosis not present

## 2017-05-12 ENCOUNTER — Ambulatory Visit
Admission: RE | Admit: 2017-05-12 | Discharge: 2017-05-12 | Disposition: A | Discharge status: Home / Self Care | Payer: 59 | Source: Ambulatory Visit | Attending: Radiation Oncology | Admitting: Radiation Oncology

## 2017-05-12 DIAGNOSIS — C50511 Malignant neoplasm of lower-outer quadrant of right female breast: Secondary | ICD-10-CM | POA: Diagnosis not present

## 2017-05-13 ENCOUNTER — Ambulatory Visit
Admission: RE | Admit: 2017-05-13 | Discharge: 2017-05-13 | Disposition: A | Discharge status: Home / Self Care | Payer: 59 | Source: Ambulatory Visit | Attending: Radiation Oncology | Admitting: Radiation Oncology

## 2017-05-13 DIAGNOSIS — C50511 Malignant neoplasm of lower-outer quadrant of right female breast: Secondary | ICD-10-CM | POA: Diagnosis not present

## 2017-05-15 DIAGNOSIS — C50511 Malignant neoplasm of lower-outer quadrant of right female breast: Secondary | ICD-10-CM | POA: Diagnosis not present

## 2017-05-16 ENCOUNTER — Inpatient Hospital Stay: Payer: 59 | Attending: Internal Medicine

## 2017-05-16 ENCOUNTER — Ambulatory Visit
Admission: RE | Admit: 2017-05-16 | Discharge: 2017-05-16 | Disposition: A | Discharge status: Home / Self Care | Payer: 59 | Source: Ambulatory Visit | Attending: Radiation Oncology | Admitting: Radiation Oncology

## 2017-05-16 DIAGNOSIS — C50511 Malignant neoplasm of lower-outer quadrant of right female breast: Secondary | ICD-10-CM | POA: Diagnosis not present

## 2017-05-17 ENCOUNTER — Ambulatory Visit
Admission: RE | Admit: 2017-05-17 | Discharge: 2017-05-17 | Disposition: A | Discharge status: Home / Self Care | Payer: 59 | Source: Ambulatory Visit | Attending: Radiation Oncology | Admitting: Radiation Oncology

## 2017-05-17 DIAGNOSIS — C50511 Malignant neoplasm of lower-outer quadrant of right female breast: Secondary | ICD-10-CM | POA: Diagnosis not present

## 2017-05-18 ENCOUNTER — Ambulatory Visit
Admission: RE | Admit: 2017-05-18 | Discharge: 2017-05-18 | Disposition: A | Discharge status: Home / Self Care | Payer: 59 | Source: Ambulatory Visit | Attending: Radiation Oncology | Admitting: Radiation Oncology

## 2017-05-18 DIAGNOSIS — C50511 Malignant neoplasm of lower-outer quadrant of right female breast: Secondary | ICD-10-CM | POA: Diagnosis not present

## 2017-05-19 ENCOUNTER — Ambulatory Visit
Admission: RE | Admit: 2017-05-19 | Discharge: 2017-05-19 | Disposition: A | Discharge status: Home / Self Care | Payer: 59 | Source: Ambulatory Visit | Attending: Radiation Oncology | Admitting: Radiation Oncology

## 2017-05-19 DIAGNOSIS — C50511 Malignant neoplasm of lower-outer quadrant of right female breast: Secondary | ICD-10-CM | POA: Diagnosis not present

## 2017-05-20 ENCOUNTER — Ambulatory Visit
Admission: RE | Admit: 2017-05-20 | Discharge: 2017-05-20 | Disposition: A | Discharge status: Home / Self Care | Payer: 59 | Source: Ambulatory Visit | Attending: Radiation Oncology | Admitting: Radiation Oncology

## 2017-05-20 DIAGNOSIS — C50511 Malignant neoplasm of lower-outer quadrant of right female breast: Secondary | ICD-10-CM | POA: Diagnosis not present

## 2017-05-23 ENCOUNTER — Ambulatory Visit
Admission: RE | Admit: 2017-05-23 | Discharge: 2017-05-23 | Disposition: A | Discharge status: Home / Self Care | Payer: 59 | Source: Ambulatory Visit | Attending: Radiation Oncology | Admitting: Radiation Oncology

## 2017-05-23 DIAGNOSIS — C50511 Malignant neoplasm of lower-outer quadrant of right female breast: Secondary | ICD-10-CM | POA: Diagnosis not present

## 2017-05-24 ENCOUNTER — Ambulatory Visit
Admission: RE | Admit: 2017-05-24 | Discharge: 2017-05-24 | Disposition: A | Discharge status: Home / Self Care | Payer: 59 | Source: Ambulatory Visit | Attending: Radiation Oncology | Admitting: Radiation Oncology

## 2017-05-24 DIAGNOSIS — C50511 Malignant neoplasm of lower-outer quadrant of right female breast: Secondary | ICD-10-CM | POA: Diagnosis not present

## 2017-05-26 ENCOUNTER — Ambulatory Visit
Admission: RE | Admit: 2017-05-26 | Discharge: 2017-05-26 | Disposition: A | Discharge status: Home / Self Care | Payer: 59 | Source: Ambulatory Visit | Attending: Radiation Oncology | Admitting: Radiation Oncology

## 2017-05-26 DIAGNOSIS — C50511 Malignant neoplasm of lower-outer quadrant of right female breast: Secondary | ICD-10-CM | POA: Diagnosis not present

## 2017-05-27 ENCOUNTER — Ambulatory Visit
Admission: RE | Admit: 2017-05-27 | Discharge: 2017-05-27 | Disposition: A | Discharge status: Home / Self Care | Payer: 59 | Source: Ambulatory Visit | Attending: Radiation Oncology | Admitting: Radiation Oncology

## 2017-05-27 DIAGNOSIS — C50511 Malignant neoplasm of lower-outer quadrant of right female breast: Secondary | ICD-10-CM | POA: Diagnosis not present

## 2017-05-30 ENCOUNTER — Ambulatory Visit
Admission: RE | Admit: 2017-05-30 | Discharge: 2017-05-30 | Disposition: A | Discharge status: Home / Self Care | Payer: 59 | Source: Ambulatory Visit | Attending: Radiation Oncology | Admitting: Radiation Oncology

## 2017-05-30 ENCOUNTER — Inpatient Hospital Stay: Payer: 59

## 2017-05-30 DIAGNOSIS — Z170 Estrogen receptor positive status [ER+]: Secondary | ICD-10-CM | POA: Insufficient documentation

## 2017-05-30 DIAGNOSIS — F17210 Nicotine dependence, cigarettes, uncomplicated: Secondary | ICD-10-CM | POA: Insufficient documentation

## 2017-05-30 DIAGNOSIS — F419 Anxiety disorder, unspecified: Secondary | ICD-10-CM | POA: Insufficient documentation

## 2017-05-30 DIAGNOSIS — Z87891 Personal history of nicotine dependence: Secondary | ICD-10-CM | POA: Insufficient documentation

## 2017-05-30 DIAGNOSIS — F329 Major depressive disorder, single episode, unspecified: Secondary | ICD-10-CM | POA: Insufficient documentation

## 2017-05-30 DIAGNOSIS — Z79899 Other long term (current) drug therapy: Secondary | ICD-10-CM | POA: Insufficient documentation

## 2017-05-30 DIAGNOSIS — C50511 Malignant neoplasm of lower-outer quadrant of right female breast: Secondary | ICD-10-CM | POA: Insufficient documentation

## 2017-05-30 DIAGNOSIS — Z9221 Personal history of antineoplastic chemotherapy: Secondary | ICD-10-CM | POA: Insufficient documentation

## 2017-05-30 DIAGNOSIS — G629 Polyneuropathy, unspecified: Secondary | ICD-10-CM | POA: Insufficient documentation

## 2017-05-30 DIAGNOSIS — K219 Gastro-esophageal reflux disease without esophagitis: Secondary | ICD-10-CM | POA: Insufficient documentation

## 2017-05-30 DIAGNOSIS — G4700 Insomnia, unspecified: Secondary | ICD-10-CM | POA: Insufficient documentation

## 2017-05-30 DIAGNOSIS — E559 Vitamin D deficiency, unspecified: Secondary | ICD-10-CM | POA: Insufficient documentation

## 2017-05-30 DIAGNOSIS — L598 Other specified disorders of the skin and subcutaneous tissue related to radiation: Secondary | ICD-10-CM | POA: Insufficient documentation

## 2017-05-31 ENCOUNTER — Ambulatory Visit
Admission: RE | Admit: 2017-05-31 | Discharge: 2017-05-31 | Disposition: A | Discharge status: Home / Self Care | Payer: 59 | Source: Ambulatory Visit | Attending: Radiation Oncology | Admitting: Radiation Oncology

## 2017-05-31 ENCOUNTER — Inpatient Hospital Stay: Payer: 59

## 2017-05-31 ENCOUNTER — Inpatient Hospital Stay: Payer: 59 | Attending: Internal Medicine | Admitting: Internal Medicine

## 2017-05-31 VITALS — BP 113/74 | HR 85 | Temp 97.3°F | Wt 187.4 lb

## 2017-05-31 DIAGNOSIS — Z9221 Personal history of antineoplastic chemotherapy: Secondary | ICD-10-CM | POA: Diagnosis not present

## 2017-05-31 DIAGNOSIS — G629 Polyneuropathy, unspecified: Secondary | ICD-10-CM | POA: Diagnosis not present

## 2017-05-31 DIAGNOSIS — Z170 Estrogen receptor positive status [ER+]: Secondary | ICD-10-CM | POA: Diagnosis not present

## 2017-05-31 DIAGNOSIS — F419 Anxiety disorder, unspecified: Secondary | ICD-10-CM | POA: Diagnosis not present

## 2017-05-31 DIAGNOSIS — L598 Other specified disorders of the skin and subcutaneous tissue related to radiation: Secondary | ICD-10-CM | POA: Diagnosis not present

## 2017-05-31 DIAGNOSIS — K219 Gastro-esophageal reflux disease without esophagitis: Secondary | ICD-10-CM | POA: Diagnosis not present

## 2017-05-31 DIAGNOSIS — E559 Vitamin D deficiency, unspecified: Secondary | ICD-10-CM | POA: Diagnosis not present

## 2017-05-31 DIAGNOSIS — C50511 Malignant neoplasm of lower-outer quadrant of right female breast: Secondary | ICD-10-CM

## 2017-05-31 DIAGNOSIS — F329 Major depressive disorder, single episode, unspecified: Secondary | ICD-10-CM | POA: Diagnosis not present

## 2017-05-31 DIAGNOSIS — Z95828 Presence of other vascular implants and grafts: Secondary | ICD-10-CM

## 2017-05-31 DIAGNOSIS — F17210 Nicotine dependence, cigarettes, uncomplicated: Secondary | ICD-10-CM

## 2017-05-31 DIAGNOSIS — Z87891 Personal history of nicotine dependence: Secondary | ICD-10-CM | POA: Diagnosis not present

## 2017-05-31 DIAGNOSIS — Z79899 Other long term (current) drug therapy: Secondary | ICD-10-CM | POA: Diagnosis not present

## 2017-05-31 DIAGNOSIS — G4700 Insomnia, unspecified: Secondary | ICD-10-CM | POA: Diagnosis not present

## 2017-05-31 LAB — CBC WITH DIFFERENTIAL/PLATELET
Basophils Absolute: 0 10*3/uL (ref 0–0.1)
Basophils Relative: 1 %
EOS ABS: 0.3 10*3/uL (ref 0–0.7)
EOS PCT: 6 %
HCT: 39.7 % (ref 35.0–47.0)
HEMOGLOBIN: 13.3 g/dL (ref 12.0–16.0)
LYMPHS PCT: 18 %
Lymphs Abs: 0.9 10*3/uL — ABNORMAL LOW (ref 1.0–3.6)
MCH: 28.8 pg (ref 26.0–34.0)
MCHC: 33.5 g/dL (ref 32.0–36.0)
MCV: 85.9 fL (ref 80.0–100.0)
MONOS PCT: 8 %
Monocytes Absolute: 0.4 10*3/uL (ref 0.2–0.9)
NEUTROS PCT: 67 %
Neutro Abs: 3.3 10*3/uL (ref 1.4–6.5)
Platelets: 233 10*3/uL (ref 150–440)
RBC: 4.62 MIL/uL (ref 3.80–5.20)
RDW: 14.5 % (ref 11.5–14.5)
WBC: 4.9 10*3/uL (ref 3.6–11.0)

## 2017-05-31 LAB — COMPREHENSIVE METABOLIC PANEL
ALK PHOS: 88 U/L (ref 38–126)
ALT: 25 U/L (ref 14–54)
ANION GAP: 9 (ref 5–15)
AST: 24 U/L (ref 15–41)
Albumin: 3.7 g/dL (ref 3.5–5.0)
BILIRUBIN TOTAL: 0.4 mg/dL (ref 0.3–1.2)
BUN: 13 mg/dL (ref 6–20)
CALCIUM: 9 mg/dL (ref 8.9–10.3)
CO2: 24 mmol/L (ref 22–32)
CREATININE: 0.62 mg/dL (ref 0.44–1.00)
Chloride: 106 mmol/L (ref 101–111)
Glucose, Bld: 118 mg/dL — ABNORMAL HIGH (ref 65–99)
Potassium: 3.9 mmol/L (ref 3.5–5.1)
SODIUM: 139 mmol/L (ref 135–145)
TOTAL PROTEIN: 6.3 g/dL — AB (ref 6.5–8.1)

## 2017-05-31 MED ORDER — HEPARIN SOD (PORK) LOCK FLUSH 100 UNIT/ML IV SOLN
500.0000 [IU] | Freq: Once | INTRAVENOUS | Status: AC
  Administered 2017-05-31: 500 [IU] via INTRAVENOUS

## 2017-05-31 MED ORDER — SODIUM CHLORIDE 0.9% FLUSH
10.0000 mL | INTRAVENOUS | Status: DC | PRN
  Administered 2017-05-31: 10 mL via INTRAVENOUS
  Filled 2017-05-31: qty 10

## 2017-05-31 NOTE — Progress Notes (Signed)
While he was in [place]  Patient Care Team: Steele Sizer, MD as PCP - General (Family Medicine) Christene Lye, MD (General Surgery) Steele Sizer, MD as Attending Physician (Family Medicine)  CHIEF COMPLAINTS/PURPOSE OF CONSULTATION:  Breast cancer  #  Oncology History   # DEC 2017- RIGHT BREAST Stage II [pT1cpN1sn; screening] ER/PR- Pos- 90%; her 2 NEU- NEG s/p Lumpect & SLNBx; Dr.Sankar ]; JAN 2018- ddAC -T x12.   # MRI liver- hemagiomas  # Genetic counseling- DICER-1 VUS**  # TAH & BSO [Dr.Hall; [facility]; 1308]; smoking     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, estrogen receptor positive ([place])   10/26/2016 Initial Diagnosis    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, estrogen receptor positive ([place])       HISTORY OF PRESENTING ILLNESS:  Shelby Barry 51 y.o.  female above history of ER/PR positive HER-2/neu negative stage II breast cancer- status post cycle 4 cycles of  Adriamycin and Cytoxan; Currently s/p weekly Taxol. Patient is currently undergoing radiation.  Patient has mild skin redness from radiation. No skin breakdown or no significant pain.  Patient complains of mild tingling and numbness the right hand; and bilateral lower extremities.. Patient has mild intermittent nausea and vomiting.She denies fevers or chills. Cough intermittent nonproductive. Unfortunately she started smoking.  ROS: A complete 10 point review of system is done which is negative except mentioned above in history of present illness  MEDICAL HISTORY:  Past Medical History:  Diagnosis Date  . Anemia   . Anxiety   . Bursitis of right shoulder   . Cancer [facility])    breast  . 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, estrogen receptor positive ([place])   . Cataract, bilateral   . Chemotherapy induced nausea and vomiting   . Cold    states she has had it for 2 months  . Depression    . GERD (gastroesophageal reflux disease)    uses baking soda for symptoms  . Insomnia   . Lumbago   . Vitamin D deficiency     SURGICAL HISTORY: Past Surgical History:  Procedure Laterality Date  . ABDOMINAL HYSTERECTOMY  04/20/2014  . BILATERAL SALPINGECTOMY    . BREAST LUMPECTOMY WITH SENTINEL LYMPH NODE BIOPSY Right 10/12/2016   Procedure: BREAST LUMPECTOMY WITH SENTINEL LYMPH NODE BX;  Surgeon: Christene Lye, MD;  Location: [facility];  Service: General;  Laterality: Right;  . COLONOSCOPY WITH PROPOFOL N/A 05/20/2016   Procedure: COLONOSCOPY WITH PROPOFOL;  Surgeon: Lucilla Lame, MD;  Location: [place];  Service: Endoscopy;  Laterality: N/A;  . ECTOPIC PREGNANCY SURGERY    . ENDOMETRIAL ABLATION    . PORTACATH PLACEMENT Left 11/04/2016   Procedure: INSERTION PORT-A-CATH;  Surgeon: Christene Lye, MD;  Location: [facility];  Service: General;  Laterality: Left;  Left subclavian vein  . TUBAL LIGATION      SOCIAL HISTORY: [employer]; in [place]; 1 pack/day; occassional alochol.  2 grown- children- 27 boy & 85 daughter Social History   Social History  . Marital status: Divorced    Spouse name: N/A  . Number of children: N/A  . Years of education: N/A   Occupational History  . Not on file.   Social History Main Topics  . Smoking status: Former Smoker    Packs/day: 0.50    Years: 20.00    Types: Cigarettes    Quit date: 08/23/2016  .  Smokeless tobacco: Never Used  . Alcohol use 0.0 oz/week     Comment: occassional  . Drug use: No  . Sexual activity: Yes    Partners: Male   Other Topics Concern  . Not on file   Social History Narrative   She is living with [name] ( boyfriend ) for the past two years, and his 62 yo daughter just moved in with them Fall 2017. Her mother is doing drugs.    Works as [employer]    FAMILY HISTORY: 2 sisters- one sister breast ca at ? 28y; other sister- ? Cancer; aunt/mom's sister- stomach cancer [60s].  Family  History  Problem Relation Age of Onset  . Depression Father   . Obesity Father   . Alcohol abuse Brother   . Seizures Brother   . Breast cancer Sister 60    ALLERGIES:  is allergic to adhesive [tape] and nickel.  MEDICATIONS:  Current Outpatient Prescriptions  Medication Sig Dispense Refill  . acetaminophen (TYLENOL) 325 MG tablet Take 325 mg by mouth every 6 (six) hours as needed for fever.    . Bismuth Subsalicylate (PEPTO-BISMOL PO) Take 1 tablet by mouth daily as needed (indigestion).    . citalopram (CELEXA) 20 MG tablet Take 1 tablet (20 mg total) by mouth daily. 30 tablet 3  . fluticasone furoate-vilanterol (BREO ELLIPTA) 100-25 MCG/INH AEPB Inhale 1 puff into the lungs daily. 60 each 0  . lidocaine-prilocaine (EMLA) cream APPLY TOPICALLY AS NEEDED.  APPLY GENEROUSLY OVER THE  MEDIPORT 45 MINUTES PRIOR  TO CHEMOTHERAPY. 30 g 1  . LORazepam (ATIVAN) 0.5 MG tablet Take 1 tablet (0.5 mg total) by mouth every 6 (six) hours as needed for anxiety (anticipatory nausea). 30 tablet 3  . Multiple Vitamin (MULTIVITAMIN WITH MINERALS) TABS tablet Take 1 tablet by mouth daily. One-A-Day    . omeprazole (PRILOSEC) 20 MG capsule Take 1 capsule (20 mg total) by mouth daily. 60 capsule 3  . ondansetron (ZOFRAN) 8 MG tablet TAKE 1 TABLET BY MOUTH  EVERY 8 HOURS AS NEEDED FOR NAUSEA OR VOMITING. START 3 DAYS AFTER CHEMO 80 tablet 0  . prochlorperazine (COMPAZINE) 10 MG tablet Take 1 tablet (10 mg total) by mouth every 6 (six) hours as needed for nausea or vomiting. 40 tablet 1  . zolpidem (AMBIEN) 10 MG tablet Take 0.5-1 tablets (5-10 mg total) by mouth at bedtime as needed for sleep. 90 tablet 3   No current facility-administered medications for this visit.       Marland Kitchen  PHYSICAL EXAMINATION: ECOG PERFORMANCE STATUS: 0 - Asymptomatic  Vitals:   05/31/17 1114  BP: 113/74  Pulse: 85  Temp: (!) 97.3 F (36.3 C)   Filed Weights   05/31/17 1114  Weight: 187 lb 6.3 oz (85 kg)    GENERAL:  Well-nourished well-developed; Alert, no distress and comfortable. Alone.   EYES: no pallor or icterus OROPHARYNX: no thrush or ulceration; good dentition  NECK: supple, no masses felt LYMPH:  no palpable lymphadenopathy in the cervical, axillary or inguinal regions LUNGS: clear to auscultation and  No wheeze or crackles HEART/CVS: regular rate & rhythm and no murmurs; No lower extremity edema ABDOMEN: abdomen soft, non-tender and normal bowel sounds Musculoskeletal:no cyanosis of digits and no clubbing  PSYCH: alert & oriented x 3 with fluent speech NEURO: no focal motor/sensory deficits SKIN:  no rashes or significant lesions  LABORATORY DATA:  I have reviewed the data as listed Lab Results  Component Value Date  WBC 4.9 05/31/2017   HGB 13.3 05/31/2017   HCT 39.7 05/31/2017   MCV 85.9 05/31/2017   PLT 233 05/31/2017    Recent Labs  03/28/17 1249 04/05/17 1318 04/12/17 1502 05/31/17 1133  NA 138 135 135 139  K 3.7 4.3 4.8 3.9  CL 105 100* 99* 106  CO2 '28 27 29 24  ' GLUCOSE 100* 99 105* 118*  BUN '9 14 13 13  ' CREATININE 0.60 0.68 0.68 0.62  CALCIUM 8.8* 9.3 9.3 9.0  GFRNONAA >60 >60 >60 >60  GFRAA >60 >60 >60 >60  PROT 6.4* 7.3  --  6.3*  ALBUMIN 3.6 4.0  --  3.7  AST 29 31  --  24  ALT 39 45  --  25  ALKPHOS 91 99  --  88  BILITOT 0.4 0.3  --  0.4    RADIOGRAPHIC STUDIES: I have personally reviewed the radiological images as listed and agreed with the findings in the report. No results found.  ASSESSMENT & PLAN:   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, estrogen receptor positive ([place]) # Breast cancer-stage II ER/PR positive HER-2/neu negative. PT1cpN1sn. Currently s/p  Adjuvant chemotherapy with Adriamycin-Cytoxan- Taxol w x12 [finished may 2018]. Currently on RT [until aug 2nd]. Patient tolerating radiation well except for mild radiation dermatitis. Discussed use of tamoxifen post radiation therapy.   # ? Nausea/vomitting- ? RT- grade  1. Recommend taking antiemetics as needed.   # radiation dermatitis- grade 1 recommend Aquaphor.  # Insomnia/anxiety/depression-stable on Ambien;  # Grade 1 peripheral neuropathy-Taxol monitor for now.  # smoking- again counselled-to quit smoking.   # follow up 6 weeks/no labs/ port flush.     Cammie Sickle, MD 05/31/2017 4:25 PM

## 2017-05-31 NOTE — Progress Notes (Signed)
Patient here today for follow up.  Patient c/o nausea vomiting, and rash from radiation.

## 2017-05-31 NOTE — Addendum Note (Signed)
Addended by: Sofie Rower A on: 05/31/2017 11:39 AM   Modules accepted: Orders

## 2017-05-31 NOTE — Assessment & Plan Note (Addendum)
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 PT1cpN1sn. Currently s/p  Adjuvant chemotherapy with Adriamycin-Cytoxan- Taxol w x12 [finished may 2018]. Currently on RT [until aug 2nd]. Patient tolerating radiation well except for mild radiation dermatitis. Discussed use of tamoxifen post radiation therapy.   # ? Nausea/vomitting- ? RT- grade 1. Recommend taking antiemetics as needed.   # radiation dermatitis- grade 1 recommend Aquaphor.  # Insomnia/anxiety/depression-stable on Ambien;  # Grade 1 peripheral neuropathy-Taxol monitor for now.  # smoking- again counselled-to quit smoking.   # follow up 6 weeks/no labs/ port flush.  

## 2017-06-01 ENCOUNTER — Ambulatory Visit
Admission: RE | Admit: 2017-06-01 | Discharge: 2017-06-01 | Disposition: A | Discharge status: Home / Self Care | Payer: 59 | Source: Ambulatory Visit | Attending: Radiation Oncology | Admitting: Radiation Oncology

## 2017-06-01 DIAGNOSIS — C50511 Malignant neoplasm of lower-outer quadrant of right female breast: Secondary | ICD-10-CM | POA: Diagnosis not present

## 2017-06-01 LAB — CANCER ANTIGEN 27.29: CAN 27.29: 20.1 U/mL (ref 0.0–38.6)

## 2017-06-02 ENCOUNTER — Ambulatory Visit
Admission: RE | Admit: 2017-06-02 | Discharge: 2017-06-02 | Disposition: A | Discharge status: Home / Self Care | Payer: 59 | Source: Ambulatory Visit | Attending: Radiation Oncology | Admitting: Radiation Oncology

## 2017-06-02 ENCOUNTER — Other Ambulatory Visit: Payer: Self-pay | Admitting: *Deleted

## 2017-06-02 DIAGNOSIS — C50511 Malignant neoplasm of lower-outer quadrant of right female breast: Secondary | ICD-10-CM | POA: Diagnosis not present

## 2017-06-02 MED ORDER — SILVER SULFADIAZINE 1 % EX CREA: 1.0000 "application " | TOPICAL_CREAM | Freq: Two times a day (BID) | CUTANEOUS | 2 refills | Status: DC

## 2017-06-03 ENCOUNTER — Ambulatory Visit: Payer: 59

## 2017-06-06 ENCOUNTER — Ambulatory Visit
Admission: RE | Admit: 2017-06-06 | Discharge: 2017-06-06 | Disposition: A | Discharge status: Home / Self Care | Payer: 59 | Source: Ambulatory Visit | Attending: Radiation Oncology | Admitting: Radiation Oncology

## 2017-06-06 DIAGNOSIS — C50511 Malignant neoplasm of lower-outer quadrant of right female breast: Secondary | ICD-10-CM | POA: Diagnosis not present

## 2017-06-07 ENCOUNTER — Ambulatory Visit
Admission: RE | Admit: 2017-06-07 | Discharge: 2017-06-07 | Disposition: A | Discharge status: Home / Self Care | Payer: 59 | Source: Ambulatory Visit | Attending: Radiation Oncology | Admitting: Radiation Oncology

## 2017-06-07 DIAGNOSIS — C50511 Malignant neoplasm of lower-outer quadrant of right female breast: Secondary | ICD-10-CM | POA: Diagnosis not present

## 2017-06-07 IMAGING — CT CT CHEST W/ CM
2 of 4 series · 15 of 36 positions shown, 18 images · IV contrast (iopamidol)
Comparison: None.

CLINICAL DATA: New diagnosis of right breast cancer 3 months ago.

EXAM:
CT CHEST WITH CONTRAST
TECHNIQUE: Multidetector CT imaging of the chest was performed during
intravenous contrast administration.
CONTRAST:  75mL [id number] IOPAMIDOL ([id number]) INJECTION 61%

[Series 6: lung. · axial · 0.64mm/px · z∈[-643,-361]mm · 12 of 159 slices shown, 15 images]
[im 9/159  mediastinal]
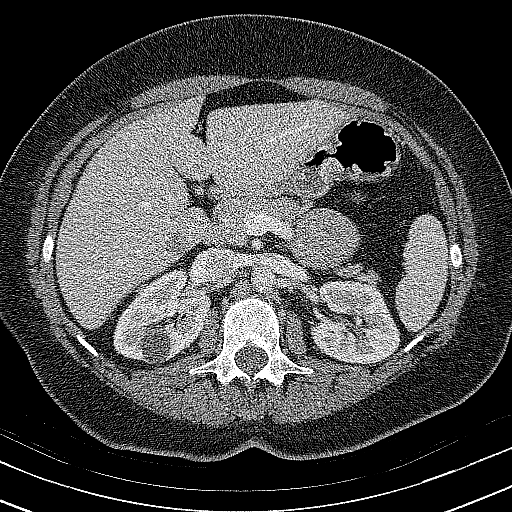
[im 9/159  lung]
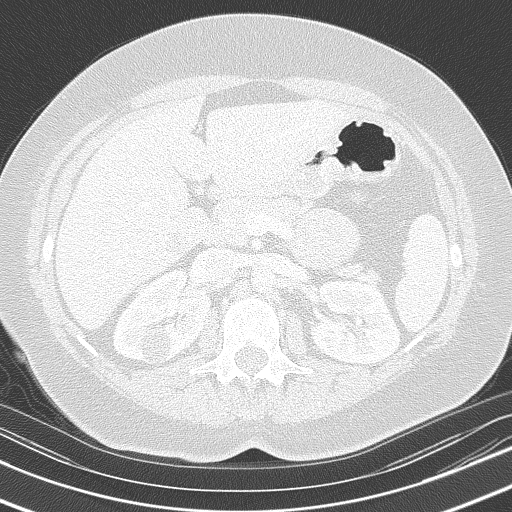
[im 27/159  lung]
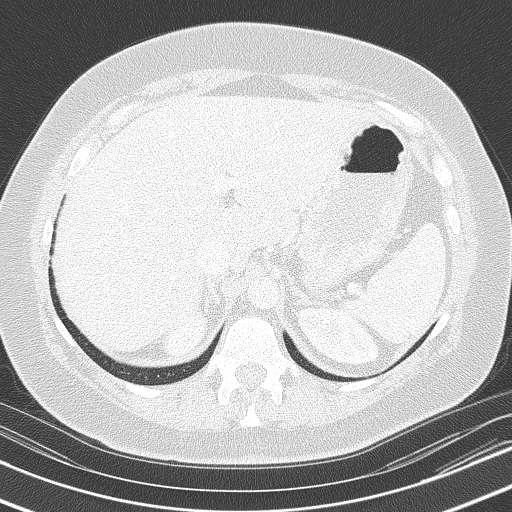
[im 36/159  lung]
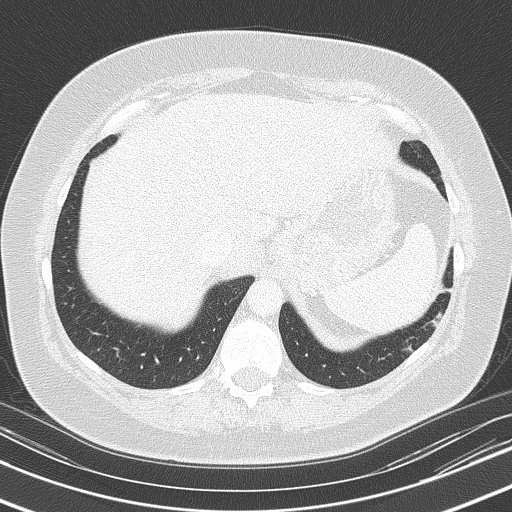
[im 44/159  lung]
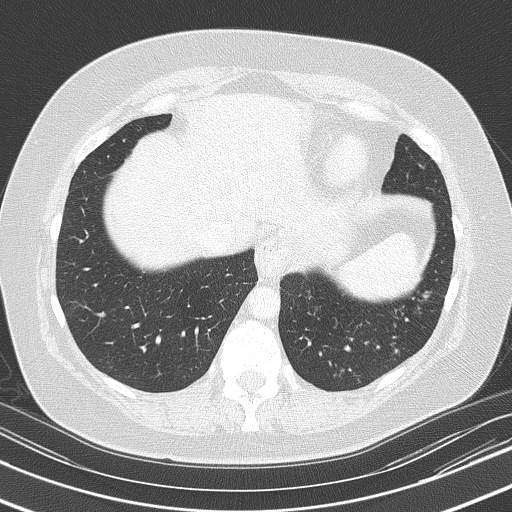
[im 62/159  mediastinal]
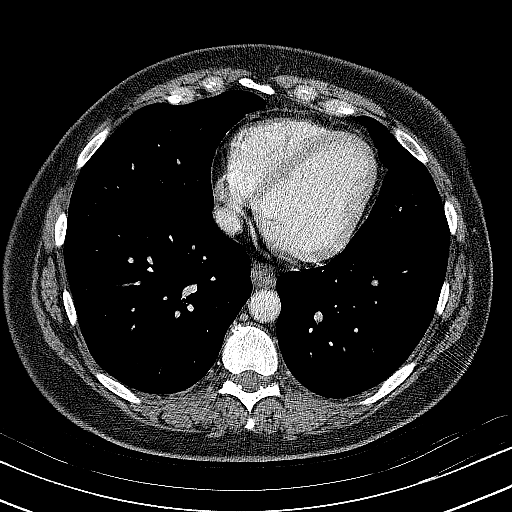
[im 62/159  lung]
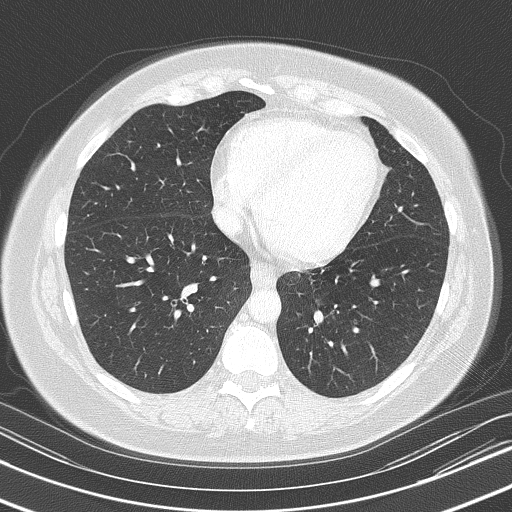
[im 71/159  lung]
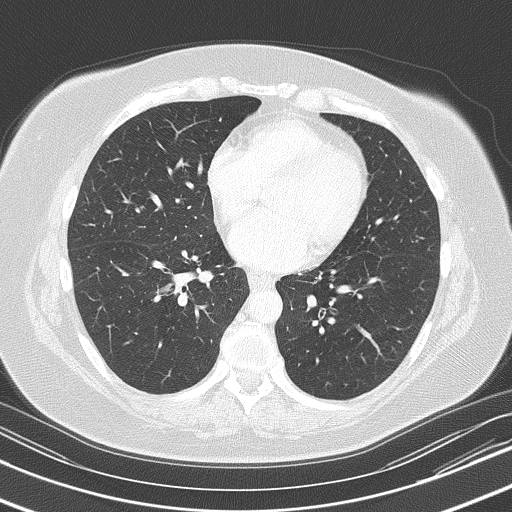
[im 88/159  lung]
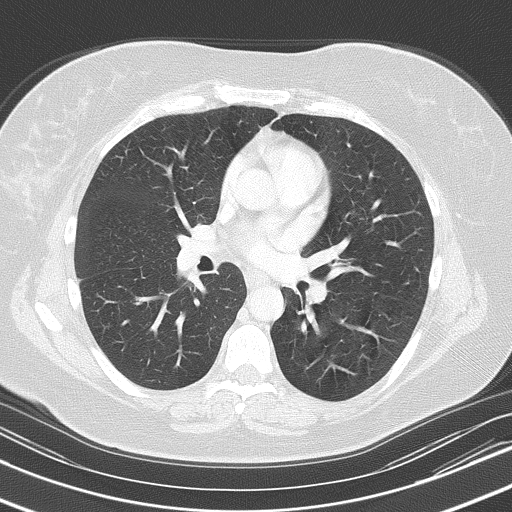
[im 97/159  lung]
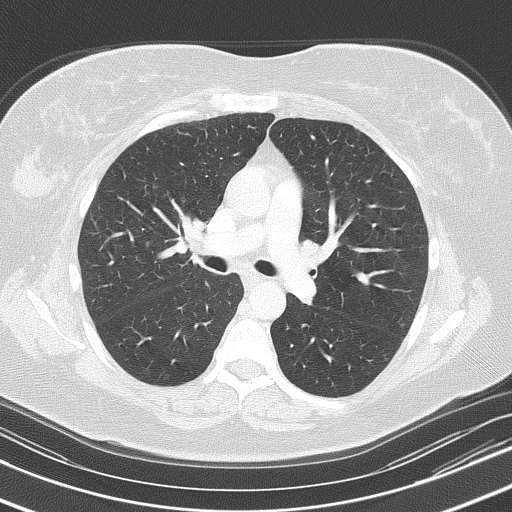
[im 115/159  mediastinal]
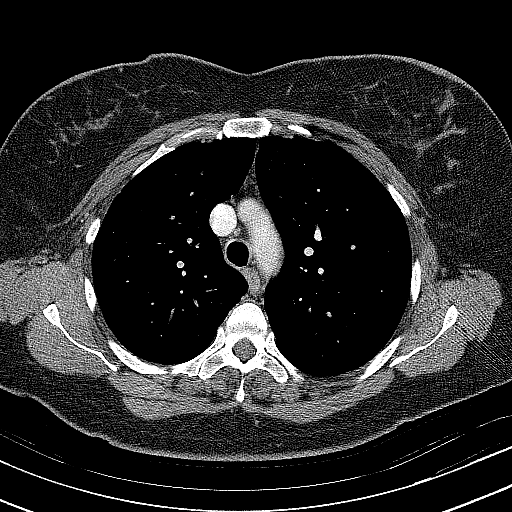
[im 115/159  lung]
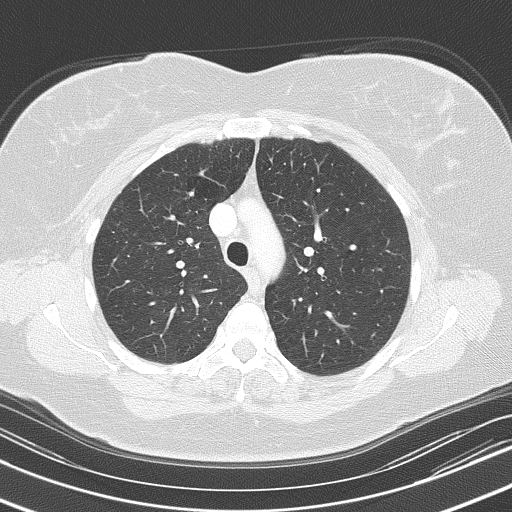
[im 123/159  lung]
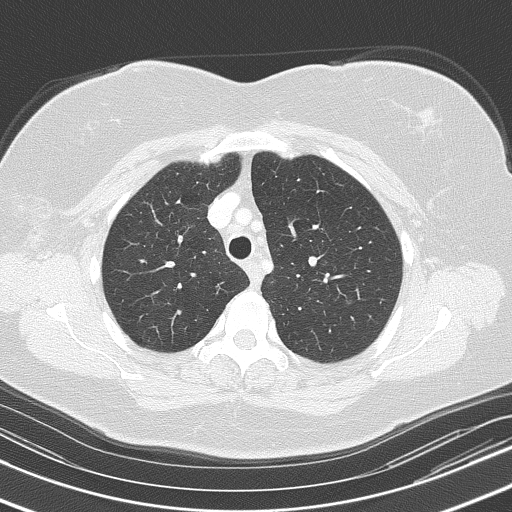
[im 132/159  lung]
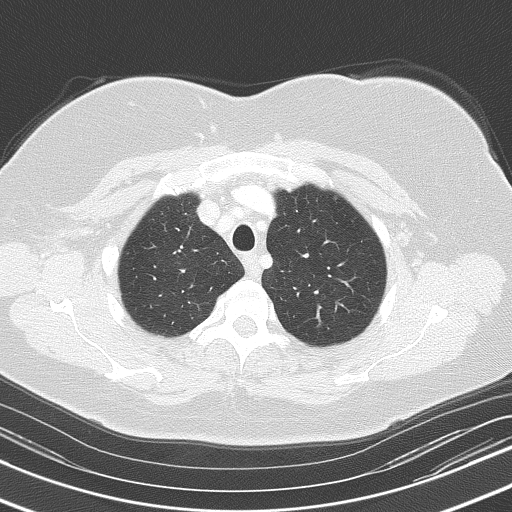
[im 150/159  lung]
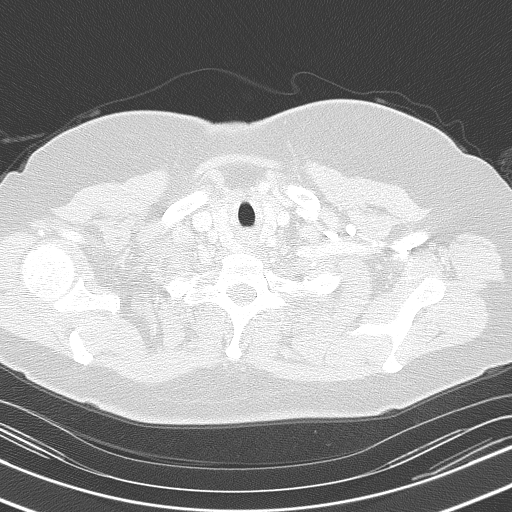

[Series 602: coronal · coronal · 0.64mm/px · 3 of 133 slices shown]
[im 27/133  lung]
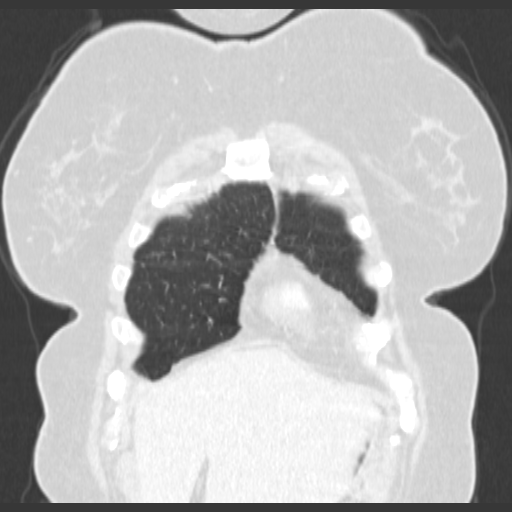
[im 53/133  lung]
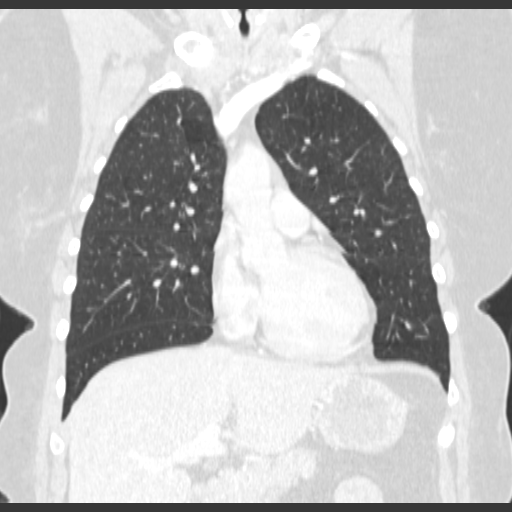
[im 80/133  lung]
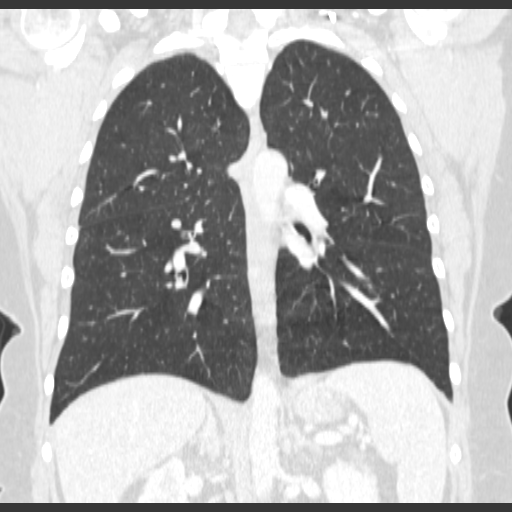

[15 of 36 positions shown; findings below may reference images not displayed]

FINDINGS: Cardiovascular: The heart size appears normal. No pericardial
effusion. Aortic atherosclerosis.

Mediastinum/Nodes: The trachea appears patent and is midline. Normal
appearance of the esophagus. No mediastinal or hilar adenopathy
identified. Right breast lesion is identified measuring 2.3 x
cm, image number 28 of series 2. No enlarged axillary or
supraclavicular lymph nodes, bilaterally.

Lungs/Pleura: No pleural fluid. Scattered pulmonary nodules scratch
set scattered small nodules are noted within the left lung and
appear most numerous within the left lower lobe posteriorly. Many of
these nodules have a tree-in-bud configuration which would be
typical of that inflammatory or infectious bronchiolitis. There are
no suspicious nodules identified within the right lung.

Upper Abdomen: Within the posteromedial right lobe of liver there is
a 1.5 cm focus of intermediate attenuation (50 HU), image number 62
of series 2. A second, indeterminate intermediate attenuation
structure within right lobe of liver measures 2.2 cm, image 58 of
series 2. The gallbladder is normal. No acute abnormality noted
within the upper abdomen.

Musculoskeletal: No aggressive lytic or sclerotic bone lesions.
IMPRESSION: 1. Right breast lesion is identified which is compatible with
recently diagnosed breast carcinoma.
2. There are two indeterminate, intermediate attenuating lesions
within right lobe of liver. In this patient who is at increased risk
recommend further investigation with either PET-CT or contrast
enhanced MRI of the liver.
3. Multiple tiny nodules are identified within the left lung and
appear most numerous in the left base. Many of these nodules have a
tree-in-bud configuration and are favored to represent sequelae of
inflammatory or infectious bronchiolitis.

## 2017-06-08 ENCOUNTER — Ambulatory Visit
Admission: RE | Admit: 2017-06-08 | Discharge: 2017-06-08 | Disposition: A | Discharge status: Home / Self Care | Payer: 59 | Source: Ambulatory Visit | Attending: Radiation Oncology | Admitting: Radiation Oncology

## 2017-06-08 DIAGNOSIS — C50511 Malignant neoplasm of lower-outer quadrant of right female breast: Secondary | ICD-10-CM | POA: Diagnosis not present

## 2017-06-09 ENCOUNTER — Ambulatory Visit
Admission: RE | Admit: 2017-06-09 | Discharge: 2017-06-09 | Disposition: A | Discharge status: Home / Self Care | Payer: 59 | Source: Ambulatory Visit | Attending: Radiation Oncology | Admitting: Radiation Oncology

## 2017-06-09 DIAGNOSIS — C50511 Malignant neoplasm of lower-outer quadrant of right female breast: Secondary | ICD-10-CM | POA: Diagnosis not present

## 2017-06-09 IMAGING — DX DG CHEST 1V PORT
1 series · 1 of 1 positions shown · non-contrast
Comparison: 07/05/2005 chest radiograph.

CLINICAL DATA: Port placement

EXAM:
PORTABLE CHEST 1 VIEW

[chest ap]
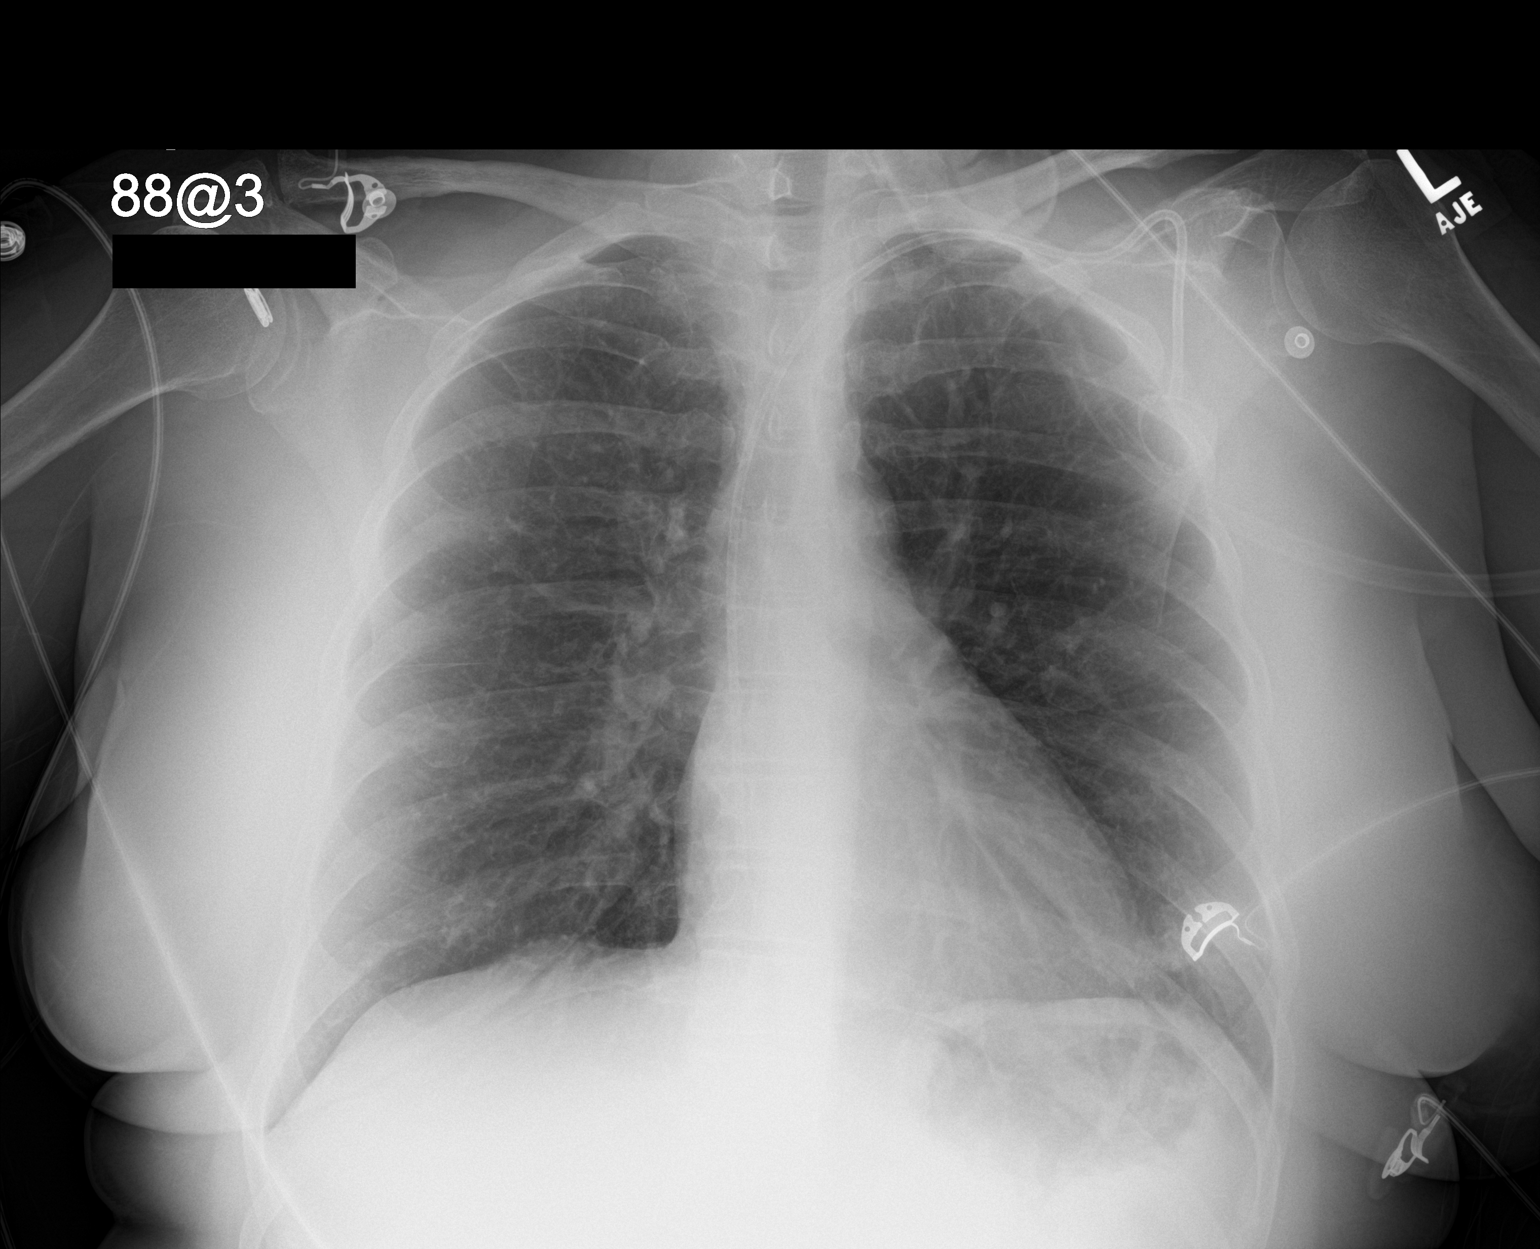

[1 of 1 positions shown; findings below may reference images not displayed]

FINDINGS: Left subclavian MediPort terminates over the right atrium near the
portacaval junction. Stable cardiomediastinal silhouette with normal
heart size. No pneumothorax. No pleural effusion. Lungs appear
clear, with no acute consolidative airspace disease and no pulmonary
edema.
IMPRESSION: No pneumothorax. No active cardiopulmonary disease. Left subclavian
MediPort terminates over the right atrium near the cavoatrial
junction.

## 2017-06-10 ENCOUNTER — Ambulatory Visit
Admission: RE | Admit: 2017-06-10 | Discharge: 2017-06-10 | Disposition: A | Discharge status: Home / Self Care | Payer: 59 | Source: Ambulatory Visit | Attending: Radiation Oncology | Admitting: Radiation Oncology

## 2017-06-10 DIAGNOSIS — C50511 Malignant neoplasm of lower-outer quadrant of right female breast: Secondary | ICD-10-CM | POA: Diagnosis not present

## 2017-06-13 ENCOUNTER — Other Ambulatory Visit: Payer: Self-pay | Admitting: *Deleted

## 2017-06-13 ENCOUNTER — Ambulatory Visit
Admission: RE | Admit: 2017-06-13 | Discharge: 2017-06-13 | Disposition: A | Discharge status: Home / Self Care | Payer: 59 | Source: Ambulatory Visit | Attending: Radiation Oncology | Admitting: Radiation Oncology

## 2017-06-13 ENCOUNTER — Inpatient Hospital Stay: Payer: 59

## 2017-06-13 DIAGNOSIS — C50511 Malignant neoplasm of lower-outer quadrant of right female breast: Secondary | ICD-10-CM

## 2017-06-13 DIAGNOSIS — Z170 Estrogen receptor positive status [ER+]: Principal | ICD-10-CM

## 2017-06-14 ENCOUNTER — Ambulatory Visit
Admission: RE | Admit: 2017-06-14 | Discharge: 2017-06-14 | Disposition: A | Discharge status: Home / Self Care | Payer: 59 | Source: Ambulatory Visit | Attending: Radiation Oncology | Admitting: Radiation Oncology

## 2017-06-14 ENCOUNTER — Ambulatory Visit: Admission: RE | Admit: 2017-06-14 | Payer: 59 | Source: Ambulatory Visit

## 2017-06-14 DIAGNOSIS — C50511 Malignant neoplasm of lower-outer quadrant of right female breast: Secondary | ICD-10-CM | POA: Diagnosis not present

## 2017-06-15 ENCOUNTER — Ambulatory Visit
Admission: RE | Admit: 2017-06-15 | Discharge: 2017-06-15 | Disposition: A | Discharge status: Home / Self Care | Payer: 59 | Source: Ambulatory Visit | Attending: Radiation Oncology | Admitting: Radiation Oncology

## 2017-06-15 DIAGNOSIS — C50511 Malignant neoplasm of lower-outer quadrant of right female breast: Secondary | ICD-10-CM | POA: Diagnosis not present

## 2017-06-16 ENCOUNTER — Ambulatory Visit
Admission: RE | Admit: 2017-06-16 | Discharge: 2017-06-16 | Disposition: A | Discharge status: Home / Self Care | Payer: 59 | Source: Ambulatory Visit | Attending: Radiation Oncology | Admitting: Radiation Oncology

## 2017-06-16 DIAGNOSIS — C50511 Malignant neoplasm of lower-outer quadrant of right female breast: Secondary | ICD-10-CM | POA: Diagnosis not present

## 2017-06-17 ENCOUNTER — Ambulatory Visit
Admission: RE | Admit: 2017-06-17 | Discharge: 2017-06-17 | Disposition: A | Discharge status: Home / Self Care | Payer: 59 | Source: Ambulatory Visit | Attending: Radiation Oncology | Admitting: Radiation Oncology

## 2017-06-17 DIAGNOSIS — C50511 Malignant neoplasm of lower-outer quadrant of right female breast: Secondary | ICD-10-CM | POA: Diagnosis not present

## 2017-06-20 ENCOUNTER — Ambulatory Visit
Admission: RE | Admit: 2017-06-20 | Discharge: 2017-06-20 | Disposition: A | Discharge status: Home / Self Care | Payer: 59 | Source: Ambulatory Visit | Attending: Radiation Oncology | Admitting: Radiation Oncology

## 2017-06-20 DIAGNOSIS — C50511 Malignant neoplasm of lower-outer quadrant of right female breast: Secondary | ICD-10-CM | POA: Diagnosis not present

## 2017-06-21 ENCOUNTER — Ambulatory Visit
Admission: RE | Admit: 2017-06-21 | Discharge: 2017-06-21 | Disposition: A | Discharge status: Home / Self Care | Payer: 59 | Source: Ambulatory Visit | Attending: Radiation Oncology | Admitting: Radiation Oncology

## 2017-06-21 ENCOUNTER — Encounter: Payer: Self-pay | Admitting: *Deleted

## 2017-06-21 DIAGNOSIS — C50511 Malignant neoplasm of lower-outer quadrant of right female breast: Secondary | ICD-10-CM

## 2017-06-21 DIAGNOSIS — E663 Overweight: Secondary | ICD-10-CM

## 2017-06-21 DIAGNOSIS — Z170 Estrogen receptor positive status [ER+]: Principal | ICD-10-CM

## 2017-06-22 ENCOUNTER — Ambulatory Visit
Admission: RE | Admit: 2017-06-22 | Discharge: 2017-06-22 | Disposition: A | Discharge status: Home / Self Care | Payer: 59 | Source: Ambulatory Visit | Attending: Radiation Oncology | Admitting: Radiation Oncology

## 2017-06-22 DIAGNOSIS — C50511 Malignant neoplasm of lower-outer quadrant of right female breast: Secondary | ICD-10-CM | POA: Diagnosis not present

## 2017-06-23 ENCOUNTER — Ambulatory Visit: Payer: 59

## 2017-06-23 ENCOUNTER — Ambulatory Visit
Admission: RE | Admit: 2017-06-23 | Discharge: 2017-06-23 | Disposition: A | Discharge status: Home / Self Care | Payer: 59 | Source: Ambulatory Visit | Attending: Radiation Oncology | Admitting: Radiation Oncology

## 2017-06-23 DIAGNOSIS — C50511 Malignant neoplasm of lower-outer quadrant of right female breast: Secondary | ICD-10-CM | POA: Diagnosis not present

## 2017-06-24 ENCOUNTER — Ambulatory Visit
Admission: RE | Admit: 2017-06-24 | Discharge: 2017-06-24 | Disposition: A | Discharge status: Home / Self Care | Payer: 59 | Source: Ambulatory Visit | Attending: Radiation Oncology | Admitting: Radiation Oncology

## 2017-06-24 DIAGNOSIS — C50511 Malignant neoplasm of lower-outer quadrant of right female breast: Secondary | ICD-10-CM | POA: Diagnosis not present

## 2017-07-12 ENCOUNTER — Inpatient Hospital Stay: Payer: 59 | Attending: Internal Medicine | Admitting: Internal Medicine

## 2017-07-12 ENCOUNTER — Inpatient Hospital Stay: Payer: 59

## 2017-07-12 VITALS — BP 108/75 | HR 88 | Temp 97.0°F | Resp 16 | Wt 183.8 lb

## 2017-07-12 DIAGNOSIS — C50511 Malignant neoplasm of lower-outer quadrant of right female breast: Secondary | ICD-10-CM | POA: Insufficient documentation

## 2017-07-12 DIAGNOSIS — Z923 Personal history of irradiation: Secondary | ICD-10-CM | POA: Insufficient documentation

## 2017-07-12 DIAGNOSIS — K219 Gastro-esophageal reflux disease without esophagitis: Secondary | ICD-10-CM | POA: Diagnosis not present

## 2017-07-12 DIAGNOSIS — F17210 Nicotine dependence, cigarettes, uncomplicated: Secondary | ICD-10-CM | POA: Diagnosis not present

## 2017-07-12 DIAGNOSIS — Z79899 Other long term (current) drug therapy: Secondary | ICD-10-CM | POA: Insufficient documentation

## 2017-07-12 DIAGNOSIS — Z170 Estrogen receptor positive status [ER+]: Secondary | ICD-10-CM | POA: Diagnosis not present

## 2017-07-12 DIAGNOSIS — F419 Anxiety disorder, unspecified: Secondary | ICD-10-CM

## 2017-07-12 DIAGNOSIS — Z9221 Personal history of antineoplastic chemotherapy: Secondary | ICD-10-CM | POA: Insufficient documentation

## 2017-07-12 DIAGNOSIS — F329 Major depressive disorder, single episode, unspecified: Secondary | ICD-10-CM | POA: Insufficient documentation

## 2017-07-12 DIAGNOSIS — G4700 Insomnia, unspecified: Secondary | ICD-10-CM | POA: Diagnosis not present

## 2017-07-12 DIAGNOSIS — Z006 Encounter for examination for normal comparison and control in clinical research program: Secondary | ICD-10-CM | POA: Diagnosis not present

## 2017-07-12 DIAGNOSIS — E559 Vitamin D deficiency, unspecified: Secondary | ICD-10-CM | POA: Diagnosis not present

## 2017-07-12 DIAGNOSIS — G629 Polyneuropathy, unspecified: Secondary | ICD-10-CM | POA: Diagnosis not present

## 2017-07-12 DIAGNOSIS — Z95828 Presence of other vascular implants and grafts: Secondary | ICD-10-CM

## 2017-07-12 MED ORDER — TAMOXIFEN CITRATE 20 MG PO TABS: 20.0000 mg | ORAL_TABLET | Freq: Every day | ORAL | 4 refills | Status: DC

## 2017-07-12 MED ORDER — HEPARIN SOD (PORK) LOCK FLUSH 100 UNIT/ML IV SOLN
500.0000 [IU] | Freq: Once | INTRAVENOUS | Status: AC
  Administered 2017-07-12: 500 [IU] via INTRAVENOUS
  Filled 2017-07-12: qty 5

## 2017-07-12 MED ORDER — SODIUM CHLORIDE 0.9% FLUSH
10.0000 mL | INTRAVENOUS | Status: DC | PRN
  Administered 2017-07-12: 10 mL via INTRAVENOUS
  Filled 2017-07-12: qty 10

## 2017-07-12 NOTE — Progress Notes (Signed)
While he was in [place]  Patient Care Team: Steele Sizer, MD as PCP - General (Family Medicine) Christene Lye, MD (General Surgery) Steele Sizer, MD as Attending Physician (Family Medicine)  CHIEF COMPLAINTS/PURPOSE OF CONSULTATION:  Breast cancer  #  Oncology History   # DEC 2017- RIGHT BREAST Stage II [pT1cpN1sn; screening] ER/PR- Pos- 90%; her 2 NEU- NEG s/p Lumpect & SLNBx; Dr.Sankar ]; JAN 2018- ddAC -T x12. AUG 2nd [finished RT].  # AUG 21st 2018- Start TAM  ---------------------------------------------------------  # MRI liver- hemagiomas  # Genetic counseling- DICER-1 VUS**  # TAH & BSO [Dr.Hall; [facility]; 8101]; smoking     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, estrogen receptor positive ([place])   10/26/2016 Initial Diagnosis    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, estrogen receptor positive ([place])       HISTORY OF PRESENTING ILLNESS:  Shelby Barry 51 y.o.  female above history of ER/PR positive HER-2/neu negative stage II breast cancer- s/p adjuvant radiation Is here for follow-up.  Patient's nausea vomiting with radiation is improved. Her skin rash from the radiation is improved. She continues to complain of mild tingling and numbness the right hand; and bilateral lower extremities. She denies fevers or chills. Cough intermittent nonproductive. Unfortunately she started smoking; although intermittently as per the patient. Continues to have difficulty sleeping at nighttime. Complains of anxiety spells.  ROS: A complete 10 point review of system is done which is negative except mentioned above in history of present illness  MEDICAL HISTORY:  Past Medical History:  Diagnosis Date  . Anemia   . Anxiety   . Bursitis of right shoulder   . Cancer [facility])    breast  . 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, estrogen receptor positive ([place])   .  Cataract, bilateral   . Chemotherapy induced nausea and vomiting   . Cold    states she has had it for 2 months  . Depression   . GERD (gastroesophageal reflux disease)    uses baking soda for symptoms  . Insomnia   . Lumbago   . Vitamin D deficiency     SURGICAL HISTORY: Past Surgical History:  Procedure Laterality Date  . ABDOMINAL HYSTERECTOMY  04/20/2014  . BILATERAL SALPINGECTOMY    . BREAST LUMPECTOMY WITH SENTINEL LYMPH NODE BIOPSY Right 10/12/2016   Procedure: BREAST LUMPECTOMY WITH SENTINEL LYMPH NODE BX;  Surgeon: Christene Lye, MD;  Location: [facility];  Service: General;  Laterality: Right;  . COLONOSCOPY WITH PROPOFOL N/A 05/20/2016   Procedure: COLONOSCOPY WITH PROPOFOL;  Surgeon: Lucilla Lame, MD;  Location: [place];  Service: Endoscopy;  Laterality: N/A;  . ECTOPIC PREGNANCY SURGERY    . ENDOMETRIAL ABLATION    . PORTACATH PLACEMENT Left 11/04/2016   Procedure: INSERTION PORT-A-CATH;  Surgeon: Christene Lye, MD;  Location: [facility];  Service: General;  Laterality: Left;  Left subclavian vein  . TUBAL LIGATION      SOCIAL HISTORY: [employer]; in [place]; 1 pack/day; occassional alochol.  2 grown- children- 82 boy & 35 daughter Social History   Social History  . Marital status: Divorced    Spouse name: N/A  . Number of children: N/A  . Years of education: N/A   Occupational History  . Not on file.   Social History Main Topics  . Smoking status: Former Smoker    Packs/day: 0.50    Years:  20.00    Types: Cigarettes    Quit date: 08/23/2016  . Smokeless tobacco: Never Used  . Alcohol use 0.0 oz/week     Comment: occassional  . Drug use: No  . Sexual activity: Yes    Partners: Male   Other Topics Concern  . Not on file   Social History Narrative   She is living with [name] ( boyfriend ) for the past two years, and his 22 yo daughter just moved in with them Fall 2017. Her mother is doing drugs.    Works as [employer]     FAMILY HISTORY: 2 sisters- one sister breast ca at ? 78y; other sister- ? Cancer; aunt/mom's sister- stomach cancer [60s].  Family History  Problem Relation Age of Onset  . Depression Father   . Obesity Father   . Alcohol abuse Brother   . Seizures Brother   . Breast cancer Sister 44    ALLERGIES:  is allergic to adhesive [tape] and nickel.  MEDICATIONS:  Current Outpatient Prescriptions  Medication Sig Dispense Refill  . acetaminophen (TYLENOL) 325 MG tablet Take 325 mg by mouth every 6 (six) hours as needed for fever.    . Bismuth Subsalicylate (PEPTO-BISMOL PO) Take 1 tablet by mouth daily as needed (indigestion).    . citalopram (CELEXA) 20 MG tablet Take 1 tablet (20 mg total) by mouth daily. 30 tablet 3  . fluticasone furoate-vilanterol (BREO ELLIPTA) 100-25 MCG/INH AEPB Inhale 1 puff into the lungs daily. 60 each 0  . lidocaine-prilocaine (EMLA) cream APPLY TOPICALLY AS NEEDED.  APPLY GENEROUSLY OVER THE  MEDIPORT 45 MINUTES PRIOR  TO CHEMOTHERAPY. 30 g 1  . LORazepam (ATIVAN) 0.5 MG tablet Take 1 tablet (0.5 mg total) by mouth every 6 (six) hours as needed for anxiety (anticipatory nausea). 30 tablet 3  . Multiple Vitamin (MULTIVITAMIN WITH MINERALS) TABS tablet Take 1 tablet by mouth daily. One-A-Day    . omeprazole (PRILOSEC) 20 MG capsule Take 1 capsule (20 mg total) by mouth daily. 60 capsule 3  . ondansetron (ZOFRAN) 8 MG tablet TAKE 1 TABLET BY MOUTH  EVERY 8 HOURS AS NEEDED FOR NAUSEA OR VOMITING. START 3 DAYS AFTER CHEMO 80 tablet 0  . prochlorperazine (COMPAZINE) 10 MG tablet Take 1 tablet (10 mg total) by mouth every 6 (six) hours as needed for nausea or vomiting. 40 tablet 1  . zolpidem (AMBIEN) 10 MG tablet Take 0.5-1 tablets (5-10 mg total) by mouth at bedtime as needed for sleep. 90 tablet 3  . silver sulfADIAZINE (SILVADENE) 1 % cream Apply 1 application topically 2 (two) times daily. (Patient not taking: Reported on 07/12/2017) 50 g 2  . tamoxifen (NOLVADEX) 20  MG tablet Take 1 tablet (20 mg total) by mouth daily. 30 tablet 4   No current facility-administered medications for this visit.    Facility-Administered Medications Ordered in Other Visits  Medication Dose Route Frequency Provider Last Rate Last Dose  . sodium chloride flush (NS) 0.9 % injection 10 mL  10 mL Intravenous PRN Cammie Sickle, MD   10 mL at 07/12/17 1040      .  PHYSICAL EXAMINATION: ECOG PERFORMANCE STATUS: 0 - Asymptomatic  Vitals:   07/12/17 1116  BP: 108/75  Pulse: 88  Resp: 16  Temp: (!) 97 F (36.1 C)   Filed Weights   07/12/17 1116  Weight: 183 lb 12.1 oz (83.3 kg)    GENERAL: Well-nourished well-developed; Alert, no distress and comfortable. Alone.   EYES: no  pallor or icterus OROPHARYNX: no thrush or ulceration; good dentition  NECK: supple, no masses felt LYMPH:  no palpable lymphadenopathy in the cervical, axillary or inguinal regions LUNGS: clear to auscultation and  No wheeze or crackles HEART/CVS: regular rate & rhythm and no murmurs; No lower extremity edema ABDOMEN: abdomen soft, non-tender and normal bowel sounds Musculoskeletal:no cyanosis of digits and no clubbing  PSYCH: alert & oriented x 3 with fluent speech NEURO: no focal motor/sensory deficits SKIN:  no rashes or significant lesions  LABORATORY DATA:  I have reviewed the data as listed Lab Results  Component Value Date   WBC 4.9 05/31/2017   HGB 13.3 05/31/2017   HCT 39.7 05/31/2017   MCV 85.9 05/31/2017   PLT 233 05/31/2017    Recent Labs  03/28/17 1249 04/05/17 1318 04/12/17 1502 05/31/17 1133  NA 138 135 135 139  K 3.7 4.3 4.8 3.9  CL 105 100* 99* 106  CO2 _0 GLUCOSE 100* 99 105* 118*  BUN _1 CREATININE 0.60 0.68 0.68 0.62  CALCIUM 8.8* 9.3 9.3 9.0  GFRNONAA >60 >60 >60 >60  GFRAA >60 >60 >60 >60  PROT 6.4* 7.3  --  6.3*  ALBUMIN 3.6 4.0  --  3.7  AST 29 31  --  24  ALT 39 45  --  25  ALKPHOS 91 99  --  88  BILITOT 0.4 0.3  --   0.4    RADIOGRAPHIC STUDIES: I have personally reviewed the radiological images as listed and agreed with the findings in the report. No results found.  ASSESSMENT & PLAN:   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, estrogen receptor positive ([place]) # Breast cancer-stage II ER/PR positive HER-2/neu negative. PT1cpN1sn. Currently s/p  Adjuvant chemotherapy with Adriamycin-Cytoxan- Taxol w x12 [finished may 2018]. s/p RT [finished aug 2nd].   # Recommend starting antihormone therapy [patient status post BSO]-  Options include tamoxifn; aromatase inhibitor. Discussed the potential side effect profile of each treatment option. We'll start patient tamoxifen- discussed in specific hot flashes and also mood swings.   # Patient interested in couple of clinical trials- including aspirin; and also clinical trial involving lifestyle changes. She has met with the clinical trials nurse.  # Anxiety- Also recommend start taking citalopram as it can help with hot flashes/mood swings.  # Insomnia/anxiety/depression-stable on Ambien; new script given. If this continues to be a problem; but then deferred to PCP.  # Grade 1 peripheral neuropathy-Taxol monitor for now.  # follow up in 2  Months/ no labs/port flush; visit as per clinical trials RN.    Cammie Sickle, MD 07/12/2017 1:13 PM

## 2017-07-12 NOTE — Assessment & Plan Note (Addendum)
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 PT1cpN1sn. Currently s/p  Adjuvant chemotherapy with Adriamycin-Cytoxan- Taxol w x12 [finished may 2018]. s/p RT [finished aug 2nd].   # Recommend starting antihormone therapy [patient status post BSO]-  Options include tamoxifn; aromatase inhibitor. Discussed the potential side effect profile of each treatment option. We'll start patient tamoxifen- discussed in specific hot flashes and also mood swings.   # Patient interested in couple of clinical trials- including aspirin; and also clinical trial involving lifestyle changes. She has met with the clinical trials nurse.  # Anxiety- Also recommend start taking citalopram as it can help with hot flashes/mood swings.  # Insomnia/anxiety/depression-stable on Ambien; new script given. If this continues to be a problem; but then deferred to PCP.  # Grade 1 peripheral neuropathy-Taxol monitor for now.  # follow up in 2  Months/ no labs/port flush; visit as per clinical trials RN.

## 2017-07-13 ENCOUNTER — Telehealth: Payer: Self-pay | Admitting: *Deleted

## 2017-07-13 ENCOUNTER — Encounter: Payer: Self-pay | Admitting: *Deleted

## 2017-07-13 DIAGNOSIS — Z170 Estrogen receptor positive status [ER+]: Principal | ICD-10-CM

## 2017-07-13 DIAGNOSIS — C50511 Malignant neoplasm of lower-outer quadrant of right female breast: Secondary | ICD-10-CM

## 2017-07-13 DIAGNOSIS — G4709 Other insomnia: Secondary | ICD-10-CM

## 2017-07-13 MED ORDER — LORAZEPAM 0.5 MG PO TABS: 0.5000 mg | ORAL_TABLET | Freq: Four times a day (QID) | ORAL | 3 refills | Status: DC | PRN

## 2017-07-13 NOTE — Telephone Encounter (Signed)
-----   Message from Secundino Ginger sent at 07/13/2017  1:39 PM EDT ----- Regarding: Shelby Barry: [phone] Dr B told her he would send Ambien to [facility] [address] but it's not there.

## 2017-07-13 NOTE — Telephone Encounter (Signed)
-----   Message from Secundino Ginger sent at 07/13/2017  1:51 PM EDT ----- Regarding: port removal Contact: [phone] Shelby Barry called back and said she forgot to mention..... She talked to Dr B about getting her port removed. She was checking to see if something had been sent to Surgical dr.

## 2017-07-13 NOTE — Telephone Encounter (Signed)
Ambien rx printed - and faxed to [place]. Spoke with patient regarding port removal Dr. B would like to pt to wait until apt with Dr. Jacinto Reap in October to further discuss port removal. Pt made aware and is agreeable.

## 2017-07-14 ENCOUNTER — Other Ambulatory Visit: Payer: Self-pay | Admitting: *Deleted

## 2017-07-14 DIAGNOSIS — C50511 Malignant neoplasm of lower-outer quadrant of right female breast: Secondary | ICD-10-CM

## 2017-07-14 DIAGNOSIS — G4709 Other insomnia: Secondary | ICD-10-CM

## 2017-07-14 DIAGNOSIS — Z170 Estrogen receptor positive status [ER+]: Secondary | ICD-10-CM

## 2017-07-14 MED ORDER — ZOLPIDEM TARTRATE 10 MG PO TABS: 5.0000 mg | ORAL_TABLET | Freq: Every evening | ORAL | 0 refills | Status: DC | PRN

## 2017-07-26 ENCOUNTER — Inpatient Hospital Stay: Payer: 59 | Attending: Internal Medicine

## 2017-07-26 ENCOUNTER — Encounter: Payer: Self-pay | Admitting: *Deleted

## 2017-07-26 DIAGNOSIS — C50511 Malignant neoplasm of lower-outer quadrant of right female breast: Secondary | ICD-10-CM

## 2017-07-26 DIAGNOSIS — Z170 Estrogen receptor positive status [ER+]: Principal | ICD-10-CM

## 2017-07-26 LAB — GLUCOSE, FASTING: GLUCOSE, FASTING: 89 mg/dL (ref 65–99)

## 2017-07-26 NOTE — Progress Notes (Signed)
Met with [name] this am to draw study labs for the ABC (A011502) study.  [name] has agreed to participate in the substudies, so blood and urine samples were collected today.  Patient was registered to the study today and her study number is [id number].  Will contact [name] when the study drugs come in and she will be given the drug and her medication logs.  Baseline AE's and solicited AE's were collected and the patient reported no problems except she takes Nexium or Prilosec for dyspepsia, Grade 1.  , RN BSN "07/26/2017 1:31 PM"  Ben R Jelinek [id number]  07/26/2017  Adverse Event Log  Study/Protocol [id number] Cycle: Baseline  Event Grade Onset Date Resolved Date Drug Name Attribution Treatment Comments  Gastrointestinal bleeding 0        Intracranial bleeding 0        Epistaxis 0        Hematuria 0        Dyspepsia 1 Prior to study   None Nexium or Prilosec as needed Has not started treatment yet  Gastritis 0        Bruising 0          

## 2017-07-26 NOTE — Progress Notes (Signed)
Met with Ms. Lamont Glasscock this am to draw study labs and obtain baseline measurements for the BWEL [id number]) study.  Ms. Rothman has agreed to participate in the substudies, so her fasting glucose and central research labs were collected today. Ms. Orrison attested to the fact that it had been 12 hours since she had last had anything to eat or drink other than water.  Her fasting glucose was 89. Baseline height was 157 cm, weight was 84.2 kg, hip circumference was 113 cm, and waist circumference was 94 cm.  Patient was registered and randomized to the study today and her study number is [id number].  Patient was randomized to the weight loss intervention arm.  Will contact Ms. Crocker with this information and inform Dr. Rogue Bussing.  Baseline AE's were collected and the patient reported no problems except she takes Nexium or Prilosec for dyspepsia, Grade 1. Raynelle Dick, RN BSN "07/26/2017 4:32 PM"  AAHANA ELZA [id number]  07/26/2017  Adverse Event Log  Study/Protocol [id number] Cycle: Baseline  Event Grade Onset Date Resolved Date Drug Name Attribution Treatment Comments  Dyspepsia 1 Prior to study   None Nexium or Prilosec as needed Has not started treatment yet

## 2017-07-27 NOTE — Progress Notes (Signed)
Patient completed baseline questionnaires. Shelby Barry, BSN 07/26/17

## 2017-08-01 ENCOUNTER — Ambulatory Visit: Payer: 59 | Admitting: Radiation Oncology

## 2017-08-09 ENCOUNTER — Encounter: Payer: Self-pay | Admitting: Internal Medicine

## 2017-08-09 MED ORDER — INV-ASPIRIN/PLACEBO 300 MG TABS ALLIANCE A011502: 1.0000 | ORAL_TABLET | Freq: Every day | ORAL | 0 refills | Status: DC

## 2017-08-10 ENCOUNTER — Encounter: Payer: Self-pay | Admitting: *Deleted

## 2017-08-10 DIAGNOSIS — C50511 Malignant neoplasm of lower-outer quadrant of right female breast: Secondary | ICD-10-CM

## 2017-08-10 DIAGNOSIS — Z170 Estrogen receptor positive status [ER+]: Principal | ICD-10-CM

## 2017-08-10 NOTE — Progress Notes (Signed)
I met with Shelby Barry on 08/09/17 to discuss her A011502 [facility]) study and to dispense her study drug ASA 360m/placebo.  Ms Shelby Killianwas given 180 tablets and 6 months of medication logs for documentation.  She had no concerns/questions about the study or the medication logs and she was instructed in the correct method for taking the medication.  There was no lot number on the medication bottle, but the prescription number was [id number].  I received an email from the study stating that only 360 pills were sent due to an upcoming expiration date and the remaining medication would need to be ordered after the first year of the study.  Normally the study would send the entire 24 month supply.  This email was sent by JCasimer Bilis Patient Care Coordinator. CRaynelle Dick RN BSN "08/10/2017 8:21 AM"

## 2017-09-06 IMAGING — CR DG CHEST 2V
3 series · 3 of 3 positions shown · non-contrast
Comparison: Chest x-ray of 11/04/2016

CLINICAL DATA: Productive cough for 4 day knees, currently on
chemotherapy, history of breast carcinoma

EXAM:
CHEST  2 VIEW

[chest pa (1 of 2)]
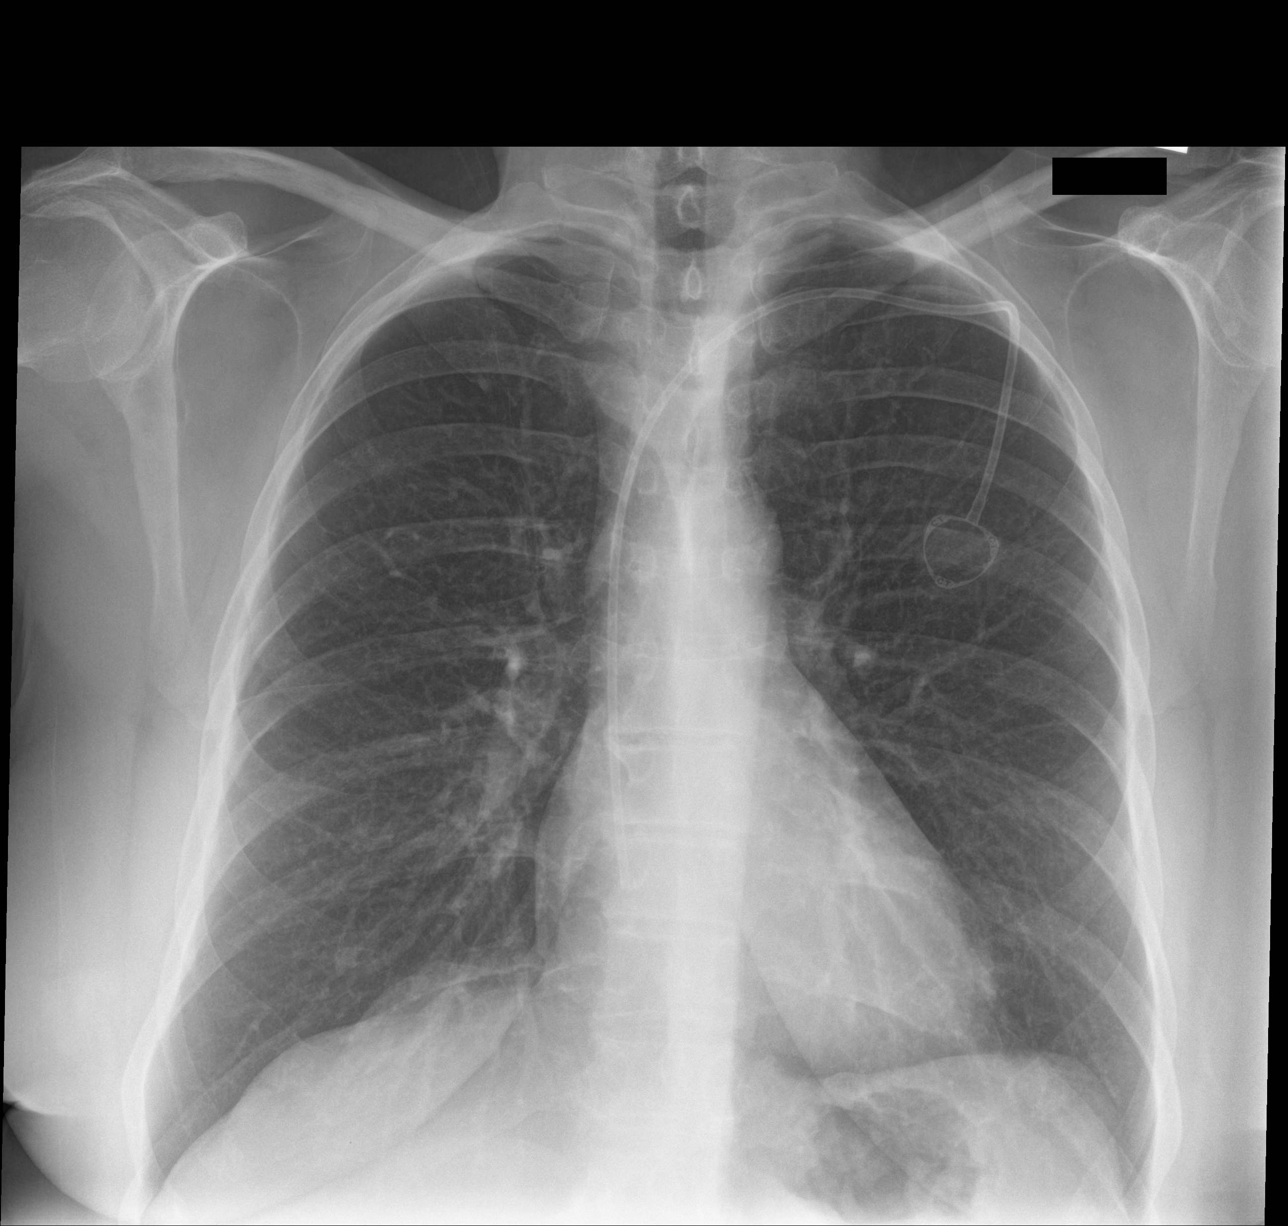

[chest lat]
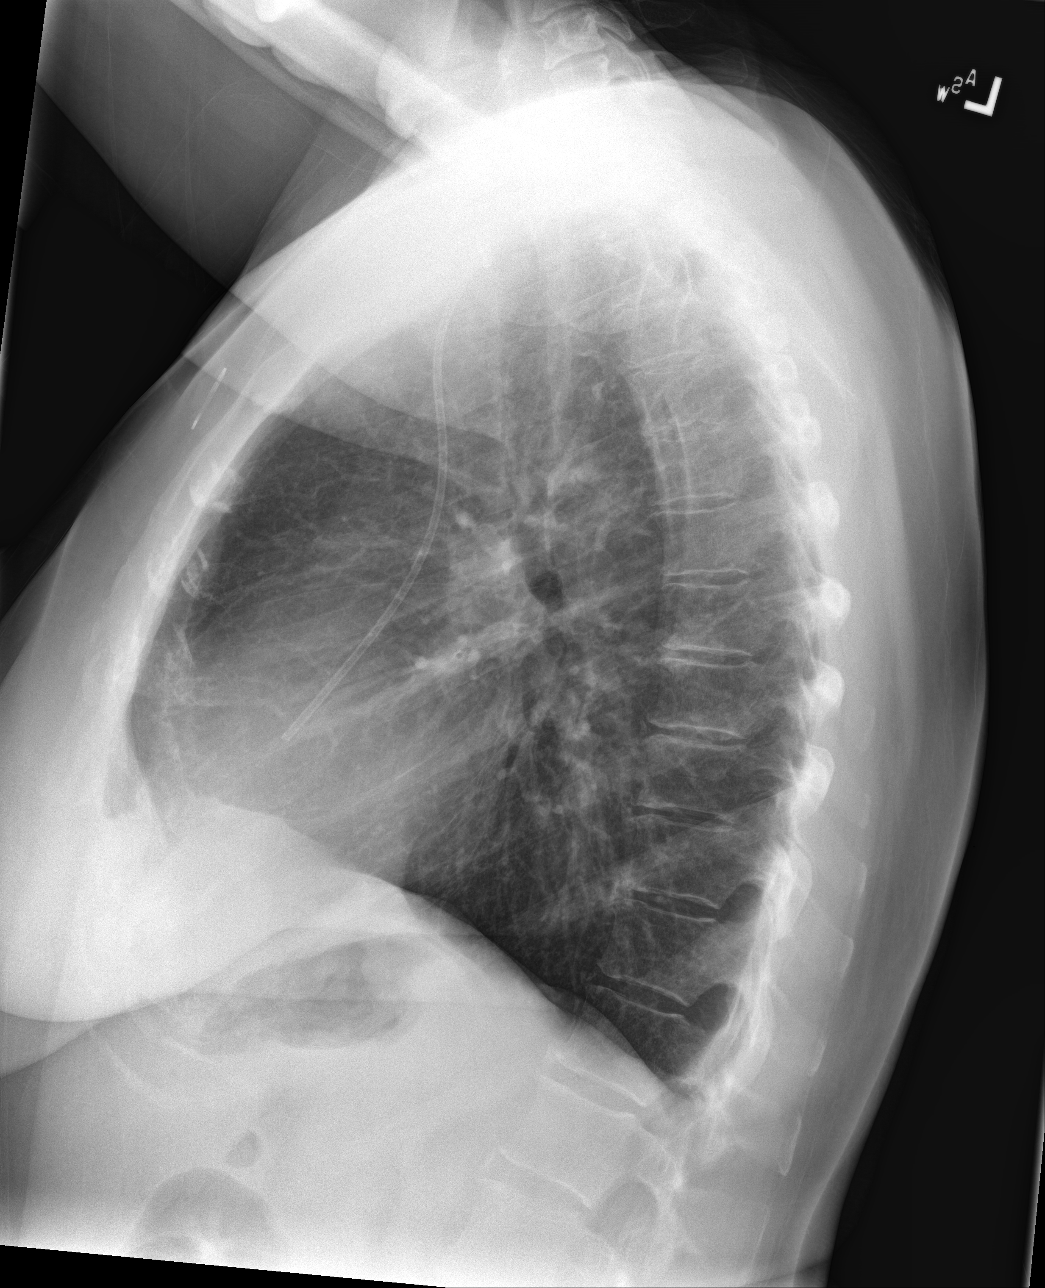

[chest pa (2 of 2)]
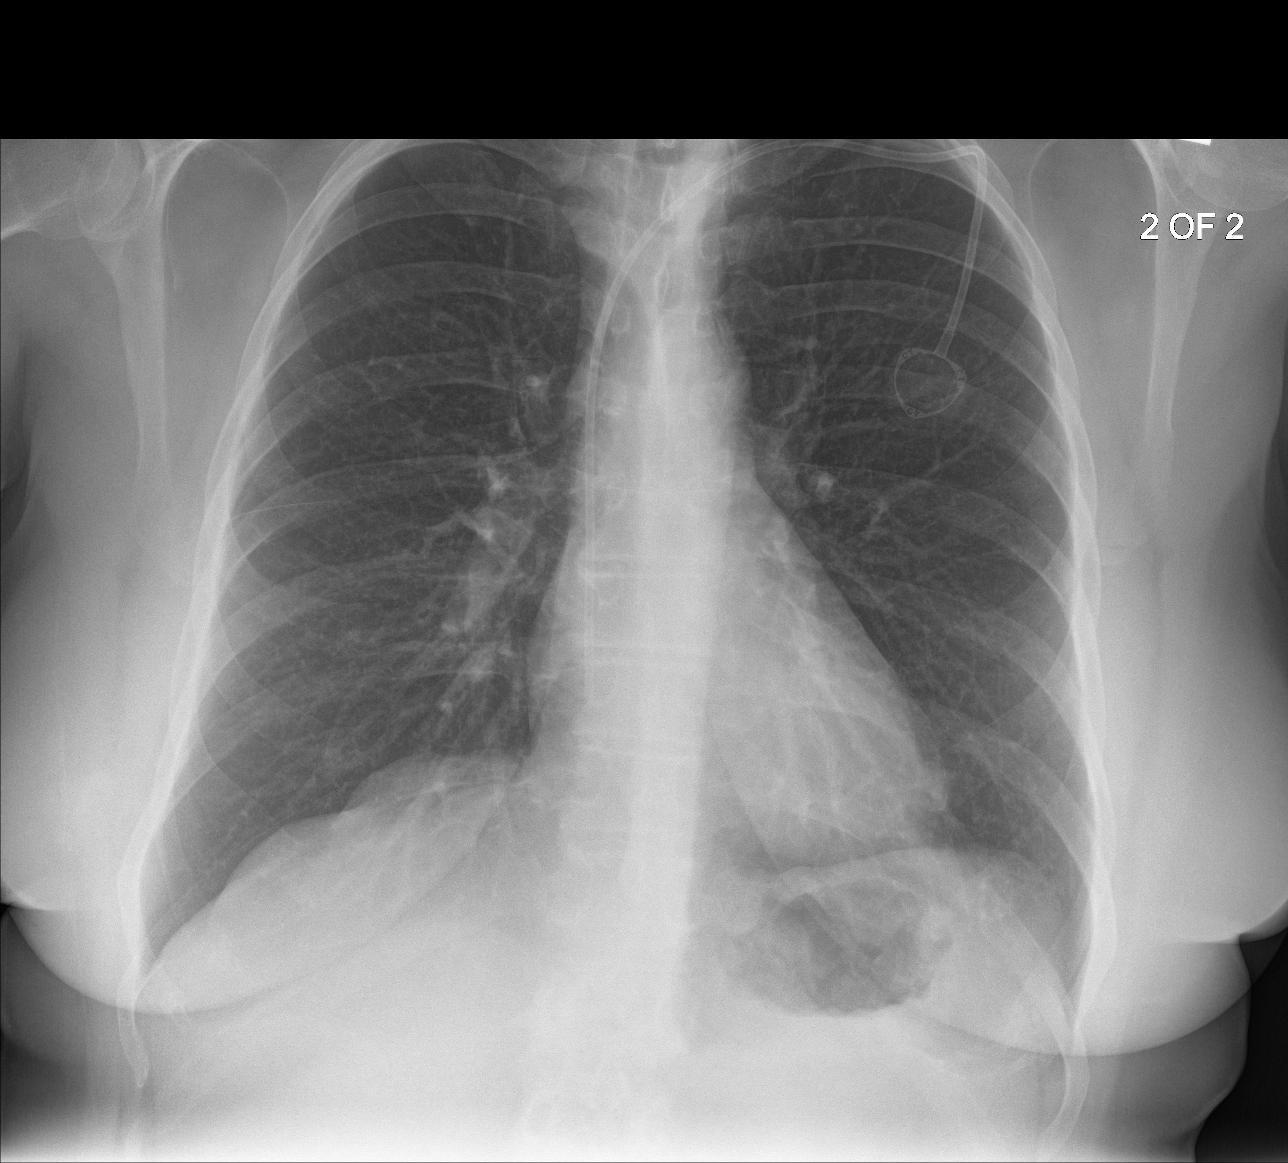

[3 of 3 positions shown; findings below may reference images not displayed]

FINDINGS: No active infiltrate or effusion is seen. There is some
peribronchial thickening which may indicate bronchitis. A left-sided
Port-A-Cath remains with the tip in the region of the right atrium.
The heart is within normal limits in size. No bony abnormality is
seen.
IMPRESSION: 1. No pneumonia or effusion.  Question bronchitis.
2. Left-sided Port-A-Cath tip in the region of the right atrium.

## 2017-09-12 ENCOUNTER — Telehealth: Payer: Self-pay | Admitting: *Deleted

## 2017-09-12 NOTE — Telephone Encounter (Signed)
Shelby Barry contacted me today about her BWEL and ABC research studies.  Shelby Barry is contemplating discontinuing her Tamoxifen due to the side effects of joint pain and body aches.  She was concerned that stopping the Tamoxifen would interfere with her current research studies and I assured her that would not be an issue.  She is to see Dr. Rogue Bussing in [place] on 09/14/17 to discuss this. Raynelle Dick, RN BSN "09/12/2017 11:55 AM"

## 2017-09-13 ENCOUNTER — Inpatient Hospital Stay: Payer: 59 | Attending: Internal Medicine

## 2017-09-13 ENCOUNTER — Encounter: Payer: Self-pay | Admitting: Radiation Oncology

## 2017-09-13 ENCOUNTER — Inpatient Hospital Stay (HOSPITAL_BASED_OUTPATIENT_CLINIC_OR_DEPARTMENT_OTHER): Payer: 59 | Admitting: Internal Medicine

## 2017-09-13 ENCOUNTER — Ambulatory Visit
Admission: RE | Admit: 2017-09-13 | Discharge: 2017-09-13 | Disposition: A | Discharge status: Home / Self Care | Payer: 59 | Source: Ambulatory Visit | Attending: Radiation Oncology | Admitting: Radiation Oncology

## 2017-09-13 VITALS — BP 146/71 | HR 88 | Temp 98.1°F | Resp 16 | Wt 185.3 lb

## 2017-09-13 VITALS — BP 137/84 | HR 91 | Temp 97.0°F | Resp 20 | Wt 185.4 lb

## 2017-09-13 DIAGNOSIS — G4700 Insomnia, unspecified: Secondary | ICD-10-CM | POA: Insufficient documentation

## 2017-09-13 DIAGNOSIS — F419 Anxiety disorder, unspecified: Secondary | ICD-10-CM | POA: Diagnosis not present

## 2017-09-13 DIAGNOSIS — C50511 Malignant neoplasm of lower-outer quadrant of right female breast: Secondary | ICD-10-CM

## 2017-09-13 DIAGNOSIS — E669 Obesity, unspecified: Secondary | ICD-10-CM | POA: Diagnosis not present

## 2017-09-13 DIAGNOSIS — Z79899 Other long term (current) drug therapy: Secondary | ICD-10-CM | POA: Insufficient documentation

## 2017-09-13 DIAGNOSIS — Z9221 Personal history of antineoplastic chemotherapy: Secondary | ICD-10-CM

## 2017-09-13 DIAGNOSIS — Z170 Estrogen receptor positive status [ER+]: Secondary | ICD-10-CM

## 2017-09-13 DIAGNOSIS — Z95828 Presence of other vascular implants and grafts: Secondary | ICD-10-CM

## 2017-09-13 DIAGNOSIS — Z7982 Long term (current) use of aspirin: Secondary | ICD-10-CM | POA: Diagnosis not present

## 2017-09-13 DIAGNOSIS — F17210 Nicotine dependence, cigarettes, uncomplicated: Secondary | ICD-10-CM | POA: Insufficient documentation

## 2017-09-13 DIAGNOSIS — Z006 Encounter for examination for normal comparison and control in clinical research program: Secondary | ICD-10-CM | POA: Diagnosis not present

## 2017-09-13 DIAGNOSIS — Z6835 Body mass index (BMI) 35.0-35.9, adult: Secondary | ICD-10-CM | POA: Diagnosis not present

## 2017-09-13 DIAGNOSIS — Z923 Personal history of irradiation: Secondary | ICD-10-CM | POA: Diagnosis not present

## 2017-09-13 DIAGNOSIS — K219 Gastro-esophageal reflux disease without esophagitis: Secondary | ICD-10-CM | POA: Insufficient documentation

## 2017-09-13 DIAGNOSIS — F329 Major depressive disorder, single episode, unspecified: Secondary | ICD-10-CM | POA: Insufficient documentation

## 2017-09-13 DIAGNOSIS — Z452 Encounter for adjustment and management of vascular access device: Secondary | ICD-10-CM | POA: Insufficient documentation

## 2017-09-13 DIAGNOSIS — E559 Vitamin D deficiency, unspecified: Secondary | ICD-10-CM | POA: Insufficient documentation

## 2017-09-13 MED ORDER — HEPARIN SOD (PORK) LOCK FLUSH 100 UNIT/ML IV SOLN
500.0000 [IU] | Freq: Once | INTRAVENOUS | Status: AC
  Administered 2017-09-13: 500 [IU] via INTRAVENOUS

## 2017-09-13 MED ORDER — SODIUM CHLORIDE 0.9% FLUSH
10.0000 mL | INTRAVENOUS | Status: DC | PRN
Start: 2017-09-13 — End: 2017-09-13
  Administered 2017-09-13: 10 mL via INTRAVENOUS
  Filled 2017-09-13: qty 10

## 2017-09-13 NOTE — Assessment & Plan Note (Addendum)
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 PT1cpN1sn. Currently s/p adjuvant chemotherapy with Adriamycin-Cytoxan- Taxol w x12 [finished may 2018]. s/p RT [finished aug 3rd].   # Started antihormone therapy, tamoxifen [status post BSO] in August. Suffered mood swings and joint aches interfering w/ ADLs. Pt requests to hold at this time. Discussed risk including recurrence. Will hold for 4 weeks. Could consider switching to AI or reinforcing use of Claritin  # Patient participating in clinical trial involving lifestyle changes improving quality of life post treatment. Followed by clinical trials nurse. Ok to coordinate blood draws with research to minimize needle sticks.   # Port Removal- patient requests to have port removed d/t psychological distress and discomfort. Will refer to Dr. Jamal Collin for removal.   # Insomnia/anxiety/depression- Has stopped citalopram. Could re-consider in setting of hot flashes and/or mood swings. Discussed returning to PCP for management of insomnia, anxiety, and depression. stable on Ambien; new script given. If this continues to be a problem; but then deferred to PCP.  # Grade 1 peripheral neuropathy sec taxol. Cont to monitor.   # follow up in 1 month. Referral for mammogram in November 2018. Refer back to PCP [facility] for management of chronic health issues and continued health maintenance.   I personally interviewed and examined the patient. Agreed with the above plan of care. Patient/family questions were answered. Dr.Brahmanday MD

## 2017-09-13 NOTE — Progress Notes (Signed)
Shelby Barry CONSULT NOTE  Patient Care Team: Shelby Sizer, MD as PCP - General (Family Medicine) Shelby Lye, MD (General Surgery) Shelby Sizer, MD as Attending Physician (Family Medicine)  CHIEF COMPLAINTS/PURPOSE OF CONSULTATION:  #Breast cancer  Oncology History   # DEC 2017- RIGHT BREAST Stage II [pT1cpN1sn; screening] ER/PR- Pos- 90%; her 2 NEU- NEG s/p Lumpect & SLNBx; Shelby Barry ]; JAN 2018- ddAC -T x12. AUG 2nd [finished RT].  # AUG 21st 2018- Start TAM  ---------------------------------------------------------  # MRI liver- hemagiomas  # Genetic counseling- DICER-1 VUS**  # TAH & BSO [Shelby Barry; [facility]; [id number]]; smoking     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, estrogen receptor positive ([place])   10/26/2016 Initial Diagnosis    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, estrogen receptor positive ([place])      HISTORY OF PRESENTING ILLNESS:  Shelby Barry 51 y.o.  female above history of ER/PR positive HER-2/neu negative stage II breast cancer- s/p adjuvant radiation returns to clinic for follow-up.  Patient's completed XRT 06/24/17. She started tamoxifen but stopped it 3 days ago. She reports joint aches and pain in her upper and lower extremities. She did not take Claritin or tylenol as previously discussed for this.   She continues to have nausea and upset stomach. She has not tried taking prn medications as she feels 'overwhelmed with so many medications' and that she 'was healthy before all this started' and 'the chemo and radiation is what made me sick'.   She stopped citalopram because she doesn't feel it helped her mood swings. She denies hot flashes. She is anxious to have her port removed as she says it reminds her of the 'awful year I went through and I want it gone'. She says that rolling over it at night wakens her and causes her discomfort. She declines lab work today.   She has not been  to see her PCP in nearly a year. Feels insomnia is related to port being in place. Continued anxiety. She continues to work.  ROS: A complete 10 point review of system is done which is negative except mentioned above in history of present illness  MEDICAL HISTORY:  Past Medical History:  Diagnosis Date  . Anemia   . Anxiety   . Bursitis of right shoulder   . Cancer [facility])    breast  . 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, estrogen receptor positive ([place])   . Cataract, bilateral   . Chemotherapy induced nausea and vomiting   . Cold    states she has had it for 2 months  . Depression   . GERD (gastroesophageal reflux disease)    uses baking soda for symptoms  . Insomnia   . Lumbago   . Vitamin D deficiency     SURGICAL HISTORY: Past Surgical History:  Procedure Laterality Date  . ABDOMINAL HYSTERECTOMY  04/20/2014  . BILATERAL SALPINGECTOMY    . BREAST LUMPECTOMY WITH SENTINEL LYMPH NODE BIOPSY Right 10/12/2016   Procedure: BREAST LUMPECTOMY WITH SENTINEL LYMPH NODE BX;  Surgeon: Shelby Lye, MD;  Location: [facility] OR[facility];  Service: General;  Laterality: Right;  . COLONOSCOPY WITH PROPOFOL N/A 05/20/2016   Procedure: COLONOSCOPY WITH PROPOFOL;  Surgeon: Lucilla Lame, MD;  Location: [place];  Service: Endoscopy;  Laterality: N/A;  . ECTOPIC PREGNANCY SURGERY    . ENDOMETRIAL ABLATION    . PORTACATH PLACEMENT Left 11/04/2016   Procedure: INSERTION  PORT-A-CATH;  Surgeon: Shelby Lye, MD;  Location: [facility] ORS;  Service: General;  Laterality: Left;  Left subclavian vein  . TUBAL LIGATION      SOCIAL HISTORY: [employer]; in [place]; 1 pack/day; occassional alochol.  2 grown- children- 52 boy & 13 daughter Social History   Social History  . Marital status: Divorced    Spouse name: N/A  . Number of children: N/A  . Years of education: N/A   Occupational History  . Not on file.   Social History Main Topics  . Smoking status:  Former Smoker    Packs/day: 0.50    Years: 20.00    Types: Cigarettes    Quit date: 08/23/2016  . Smokeless tobacco: Never Used  . Alcohol use 0.0 oz/week     Comment: occassional  . Drug use: No  . Sexual activity: Yes    Partners: Male   Other Topics Concern  . Not on file   Social History Narrative   She is living with [name] ( boyfriend ) for the past two years, and his 67 yo daughter just moved in with them Fall 2017. Her mother is doing drugs.    Works as [employer]    FAMILY HISTORY: 2 sisters- one sister breast ca at ? 69y; other sister- ? Cancer; aunt/mom's sister- stomach cancer [60s].  Family History  Problem Relation Age of Onset  . Depression Father   . Obesity Father   . Alcohol abuse Brother   . Seizures Brother   . Breast cancer Sister 35    ALLERGIES:  is allergic to adhesive [tape] and nickel.  MEDICATIONS:  Current Outpatient Prescriptions  Medication Sig Dispense Refill  . fluticasone furoate-vilanterol (BREO ELLIPTA) 100-25 MCG/INH AEPB Inhale 1 puff into the lungs daily. 60 each 0  . Investigational aspirin/placebo 300 MG tablet [facility] [id number] Take 1 tablet by mouth daily. Take with food or a full glass of water.  Do not crush enteric coated tablets. 180 tablet 0  . lidocaine-prilocaine (EMLA) cream APPLY TOPICALLY AS NEEDED.  APPLY GENEROUSLY OVER THE  MEDIPORT 45 MINUTES PRIOR  TO CHEMOTHERAPY. 30 g 1  . LORazepam (ATIVAN) 0.5 MG tablet Take 1 tablet (0.5 mg total) by mouth every 6 (six) hours as needed for anxiety (anticipatory nausea). 30 tablet 3  . Multiple Vitamin (MULTIVITAMIN WITH MINERALS) TABS tablet Take 1 tablet by mouth daily. One-A-Day    . omeprazole (PRILOSEC) 20 MG capsule Take 1 capsule (20 mg total) by mouth daily. 60 capsule 3  . zolpidem (AMBIEN) 10 MG tablet Take 0.5-1 tablets (5-10 mg total) by mouth at bedtime as needed for sleep. 90 tablet 0  . acetaminophen (TYLENOL) 325 MG tablet Take 325 mg by mouth every 6 (six) hours as  needed for fever.    . Bismuth Subsalicylate (PEPTO-BISMOL PO) Take 1 tablet by mouth daily as needed (indigestion).    . ondansetron (ZOFRAN) 8 MG tablet TAKE 1 TABLET BY MOUTH  EVERY 8 HOURS AS NEEDED FOR NAUSEA OR VOMITING. START 3 DAYS AFTER CHEMO (Patient not taking: Reported on 09/13/2017) 80 tablet 0  . prochlorperazine (COMPAZINE) 10 MG tablet Take 1 tablet (10 mg total) by mouth every 6 (six) hours as needed for nausea or vomiting. (Patient not taking: Reported on 09/13/2017) 40 tablet 1   No current facility-administered medications for this visit.    Facility-Administered Medications Ordered in Other Visits  Medication Dose Route Frequency Provider Last Rate Last Dose  . sodium chloride flush (NS)  0.9 % injection 10 mL  10 mL Intravenous PRN Charlaine Dalton R, MD   10 mL at 07/12/17 1040    PHYSICAL EXAMINATION: ECOG PERFORMANCE STATUS: 1 - Symptomatic but completely ambulatory  Vitals:   09/13/17 1105  BP: (!) 146/71  Pulse: 88  Resp: 16  Temp: 98.1 F (36.7 C)   Filed Weights   09/13/17 1105  Weight: 185 lb 4.8 oz (84.1 kg)    GENERAL: Well-nourished well-developed; Alert, no distress and comfortable. Accompanied by boyfriend EYES: no pallor or icterus OROPHARYNX: no thrush or ulceration; upper and lower dentures NECK: supple, no masses felt LYMPH:  no palpable lymphadenopathy in the cervical, axillary or inguinal regions LUNGS: clear to auscultation and  No wheeze or crackles HEART/CVS: regular rate & rhythm and no murmurs; No lower extremity edema. Varicose veins BLE, no discoloration, temperature warm and well perfused. No edema.  ABDOMEN: abdomen soft, non-tender and normal bowel sounds Musculoskeletal:no cyanosis of digits and no clubbing  PSYCH: alert & oriented x 3 with fluent speech NEURO: no focal motor/sensory deficits SKIN:  no rashes or significant lesions BREAST- deferred today   LABORATORY DATA:  I have reviewed the data as listed Lab Results   Component Value Date   WBC 4.9 05/31/2017   HGB 13.3 05/31/2017   HCT 39.7 05/31/2017   MCV 85.9 05/31/2017   PLT 233 05/31/2017    Recent Labs  03/28/17 1249 04/05/17 1318 04/12/17 1502 05/31/17 1133  NA 138 135 135 139  K 3.7 4.3 4.8 3.9  CL 105 100* 99* 106  CO2 '28 27 29 24  ' GLUCOSE 100* 99 105* 118*  BUN '9 14 13 13  ' CREATININE 0.60 0.68 0.68 0.62  CALCIUM 8.8* 9.3 9.3 9.0  GFRNONAA >60 >60 >60 >60  GFRAA >60 >60 >60 >60  PROT 6.4* 7.3  --  6.3*  ALBUMIN 3.6 4.0  --  3.7  AST 29 31  --  24  ALT 39 45  --  25  ALKPHOS 91 99  --  88  BILITOT 0.4 0.3  --  0.4    RADIOGRAPHIC STUDIES:  MR ABDOMEN W WO CONTRAST 11/12/16 IMPRESSION: At least four hepatic lesions which are compatible with benign hemangiomas, including two dominant lesions in segment 6 which correspond to the findings on recent CT chest.  No findings suspicious for metastatic disease.   Electronically Signed   By: Julian Hy M.D.   On: 11/12/2016 16:03  I have personally reviewed the radiological images as listed and agreed with the findings in the report.  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 PT1cpN1sn. Currently s/p adjuvant chemotherapy with Adriamycin-Cytoxan- Taxol w x12 [finished may 2018]. s/p RT [finished aug  3rd].   # Started antihormone therapy, tamoxifen [status post BSO] in August. Suffered mood swings and joint aches interfering w/ ADLs. Pt requests to hold at this time. Discussed risk including recurrence. Will hold for 4 weeks. Could consider switching to AI or reinforcing use of Claritin  # Patient participating in clinical trial involving lifestyle changes improving quality of life post treatment. Followed by clinical trials nurse. Ok to coordinate blood draws with research to minimize needle sticks.   # Port Removal- patient  requests to have port removed d/t psychological distress and discomfort. Will refer to Dr. Jamal Collin for removal.   # Insomnia/anxiety/depression- Has stopped citalopram. Could re-consider in setting  of hot flashes and/or mood swings. Discussed returning to PCP for management of insomnia, anxiety, and depression. stable on Ambien; new script given. If this continues to be a problem; but then deferred to PCP.  # Grade 1 peripheral neuropathy sec taxol. Cont to monitor.   # follow up in 1 month. Referral for mammogram in November 2018. Refer back to PCP [facility] for management of chronic health issues and continued health maintenance.   Obesity, Class II, BMI 35-39.9, no comorbidity #obesity- bmi 35- goal for patient to reduce bmi to 33-34 over next year through health eating and activity as patient has now completed chemotherapy. Encouraged patient to discuss with PCP. Per patient request, completed paperwork for employer benefits.     Cammie Sickle, MD 09/19/2017 4:14 PM

## 2017-09-13 NOTE — Assessment & Plan Note (Signed)
#  obesity- bmi 35- goal for patient to reduce bmi to 33-34 over next year through health eating and activity as patient has now completed chemotherapy. Encouraged patient to discuss with PCP. Per patient request, completed paperwork for employer benefits.

## 2017-09-13 NOTE — Progress Notes (Signed)
Radiation Oncology Follow up Note  Name: Shelby Barry   Date:   09/13/2017 MRN:  [id number] DOB: 09/07/1966    This 51 y.o. female presents to the clinic today for one-month follow-up status post whole breast radiation for stage II ER/PR positive invasive mammary carcinoma of the right breast lower outer quadrant.  REFERRING PROVIDER: Steele Sizer, MD  HPI: Patient is a 51 year old female now seen out 1 month having completed whole breast radiation to her right breast and peripheral lymphatics for stage II (T1 CN I M0) ER/PR positive HER-2/neu negative invasive mammary carcinoma status post wide local excision. Seen today in routine follow-up she is doing fairly well pain. She states her breast is somewhat thickened which we would expect after radiation. She's been started on tamoxifen and is causing joint pain she is seen the medical oncologists today about that..  COMPLICATIONS OF TREATMENT: none  FOLLOW UP COMPLIANCE: keeps appointments   PHYSICAL EXAM:  BP 137/84   Pulse 91   Temp (!) 97 F (36.1 C)   Resp 20   Wt 185 lb 6.5 oz (84.1 kg)   BMI 35.03 kg/m  Lungs are clear to A&P cardiac examination essentially unremarkable with regular rate and rhythm. No dominant mass or nodularity is noted in either breast in 2 positions examined. Incision is well-healed. No axillary or supraclavicular adenopathy is appreciated. Cosmetic result is excellent. She has no evidence of lymphedema of her upper extremities. Well-developed well-nourished patient in NAD. HEENT reveals PERLA, EOMI, discs not visualized.  Oral cavity is clear. No oral mucosal lesions are identified. Neck is clear without evidence of cervical or supraclavicular adenopathy. Lungs are clear to A&P. Cardiac examination is essentially unremarkable with regular rate and rhythm without murmur rub or thrill. Abdomen is benign with no organomegaly or masses noted. Motor sensory and DTR levels are equal and symmetric in the upper and  lower extremities. Cranial nerves II through XII are grossly intact. Proprioception is intact. No peripheral adenopathy or edema is identified. No motor or sensory levels are noted. Crude visual fields are within normal range.  RADIOLOGY RESULTS: No current films for review  PLAN: Present time patient is doing well 1 month out from whole breast radiation. She'll see medical oncology today about tamoxifen. I've encouraged her to continue on anti-estrogen therapy. I've asked to see her back in 4-5 months for follow-up. She knows to call sooner with any concerns.  I would like to take this opportunity to thank you for allowing me to participate in the care of your patient.Armstead Peaks., MD

## 2017-09-20 ENCOUNTER — Ambulatory Visit (INDEPENDENT_AMBULATORY_CARE_PROVIDER_SITE_OTHER): Payer: 59 | Admitting: Family Medicine

## 2017-09-20 ENCOUNTER — Encounter: Payer: Self-pay | Admitting: Family Medicine

## 2017-09-20 VITALS — BP 118/64 | HR 89 | Temp 97.8°F | Resp 16 | Ht 61.0 in | Wt 189.3 lb

## 2017-09-20 DIAGNOSIS — Z114 Encounter for screening for human immunodeficiency virus [HIV]: Secondary | ICD-10-CM

## 2017-09-20 DIAGNOSIS — Z113 Encounter for screening for infections with a predominantly sexual mode of transmission: Secondary | ICD-10-CM

## 2017-09-20 DIAGNOSIS — K219 Gastro-esophageal reflux disease without esophagitis: Secondary | ICD-10-CM

## 2017-09-20 DIAGNOSIS — C50511 Malignant neoplasm of lower-outer quadrant of right female breast: Secondary | ICD-10-CM

## 2017-09-20 DIAGNOSIS — Z131 Encounter for screening for diabetes mellitus: Secondary | ICD-10-CM

## 2017-09-20 DIAGNOSIS — R5383 Other fatigue: Secondary | ICD-10-CM

## 2017-09-20 DIAGNOSIS — Z79899 Other long term (current) drug therapy: Secondary | ICD-10-CM

## 2017-09-20 DIAGNOSIS — F325 Major depressive disorder, single episode, in full remission: Secondary | ICD-10-CM

## 2017-09-20 DIAGNOSIS — Z170 Estrogen receptor positive status [ER+]: Secondary | ICD-10-CM | POA: Diagnosis not present

## 2017-09-20 DIAGNOSIS — E559 Vitamin D deficiency, unspecified: Secondary | ICD-10-CM

## 2017-09-20 DIAGNOSIS — G4700 Insomnia, unspecified: Secondary | ICD-10-CM

## 2017-09-20 DIAGNOSIS — Z13220 Encounter for screening for lipoid disorders: Secondary | ICD-10-CM

## 2017-09-20 MED ORDER — TRAZODONE HCL 50 MG PO TABS: 25.0000 mg | ORAL_TABLET | Freq: Every evening | ORAL | 0 refills | Status: DC | PRN

## 2017-09-20 MED ORDER — OMEPRAZOLE 20 MG PO CPDR: 20.0000 mg | DELAYED_RELEASE_CAPSULE | Freq: Every day | ORAL | 1 refills | Status: DC

## 2017-09-20 NOTE — Progress Notes (Signed)
Name: Shelby Barry   MRN: [id number]    DOB: December 02, 1965   Date:09/20/2017       Progress Note  Subjective  Chief Complaint  Chief Complaint  Patient presents with  . Medication Refill  . Gastroesophageal Reflux    Takes medication daily and does well with symptoms- needs refill to Mail Order  . Insomnia    Needs Refill of Medication to Mail Order- Average amount of sleep- 5 hours nightly  . Breast Cancer    Right Side- Needs another Mammogram scheduled    HPI  Insomnia:she has been on Ambien for years, tried going down to 5 mg but unable to sleep, she has one refill left at pharmacy, but did not realize until today, therefore she has been 4 days without medication, only able to sleep 4-5 hours without medication, but able to sleep 6-7 hours with medication  Breast cancer right : diagnosed 09/2016, finished chemo and radiation treatment done 06/2017, tried Tamaxifen, but it caused severe body aches and mood swings/depression. She stopped medications. Oncologist still recommends medication and advised to re-consider starting Celexa, however patient dose not want to take medication for depression/mood at this time, states not depressed since stopped medication. She is also on a clinical trial   GERD: she has heart burn and indigestion. She lost weight in 2017 and reflux resolved, but once weight has been back reflux symptoms returned. Currently taking Omeprazole daily and symptoms are under control. She states can only skip one day without having symptoms.   Obesity; she states she will resume exercising, she is part of a study called [employer] to assist on weight loss through the cancer center.   Patient Active Problem List   Diagnosis Date Noted  . Liver lesion 11/09/2016  . Cough in adult 10/29/2016  . 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 Abnormal mammogram 09/22/2016  . Depression, major, recurrent, mild ([place])  08/22/2015  . Bursitis of shoulder 08/22/2015  . Bilateral cataracts 08/22/2015  . Circadian rhythm sleep disorder, 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  . Vitamin D deficiency 08/22/2015  . LBP (low back pain) 08/22/2015  . GAD (generalized anxiety disorder) 08/22/2015  . Tobacco use 08/22/2015  . History of anemia 08/17/2007  . Obesity, Class II, BMI 35-39.9, no comorbidity 08/16/2007  . Insomnia 08/16/2007    Past Surgical History:  Procedure Laterality Date  . ABDOMINAL HYSTERECTOMY  04/20/2014  . BILATERAL SALPINGECTOMY    . BREAST LUMPECTOMY WITH SENTINEL LYMPH NODE BIOPSY Right 10/12/2016   Procedure: BREAST LUMPECTOMY WITH SENTINEL LYMPH NODE BX;  Surgeon: Christene Lye, MD;  Location: [facility];  Service: General;  Laterality: Right;  . COLONOSCOPY WITH PROPOFOL N/A 05/20/2016   Procedure: COLONOSCOPY WITH PROPOFOL;  Surgeon: Lucilla Lame, MD;  Location: [place];  Service: Endoscopy;  Laterality: N/A;  . ECTOPIC PREGNANCY SURGERY    . ENDOMETRIAL ABLATION    . PORTACATH PLACEMENT Left 11/04/2016   Procedure: INSERTION PORT-A-CATH;  Surgeon: Christene Lye, MD;  Location: [facility];  Service: General;  Laterality: Left;  Left subclavian vein  . TUBAL LIGATION      Family History  Problem Relation Age of Onset  . Depression Father   . Obesity Father   . Alcohol abuse Brother   . Seizures Brother   . Breast cancer Sister 58    Social History   Social History  . Marital status: Divorced    Spouse name:  N/A  . Number of children: N/A  . Years of education: N/A   Occupational History  . Not on file.   Social History Main Topics  . Smoking status: Former Smoker    Packs/day: 0.50    Years: 20.00    Types: Cigarettes    Quit date: 08/23/2016  . Smokeless tobacco: Never Used  . Alcohol use 0.0 oz/week     Comment: occassional  . Drug use: No  . Sexual activity: Yes    Partners: Male   Other Topics Concern  . Not on file   Social History  Narrative   She is living with [name] ( boyfriend ) for the past two years, and his 55 yo daughter just moved in with them Fall 2017. Her mother is doing drugs.    Works as [employer]     Current Outpatient Prescriptions:  .  acetaminophen (TYLENOL) 325 MG tablet, Take 325 mg by mouth every 6 (six) hours as needed for fever., Disp: , Rfl:  .  Bismuth Subsalicylate (PEPTO-BISMOL PO), Take 1 tablet by mouth daily as needed (indigestion)., Disp: , Rfl:  .  Investigational aspirin/placebo 300 MG tablet Alliance [id number], Take 1 tablet by mouth daily. Take with food or a full glass of water.  Do not crush enteric coated tablets., Disp: 180 tablet, Rfl: 0 .  Multiple Vitamin (MULTIVITAMIN WITH MINERALS) TABS tablet, Take 1 tablet by mouth daily. One-A-Day, Disp: , Rfl:  .  omeprazole (PRILOSEC) 20 MG capsule, Take 1 capsule (20 mg total) by mouth daily., Disp: 60 capsule, Rfl: 3 .  zolpidem (AMBIEN) 10 MG tablet, Take 0.5-1 tablets (5-10 mg total) by mouth at bedtime as needed for sleep., Disp: 90 tablet, Rfl: 0  Allergies  Allergen Reactions  . Adhesive [Tape] Other (See Comments)    Blisters/rash  PLEASE USE PAPER TAPE ONLY  . Nickel     PT CAN WEAR GOLD, STERLING SILVER WITH NO PROBLEM-PT HAS NEVER HAD ANY PROBLEMS IN THE OR     ROS  Constitutional: Negative for fever or weight change.  Respiratory: Negative for cough and shortness of breath.   Cardiovascular: Negative for chest pain or palpitations.  Gastrointestinal: Negative for abdominal pain, no bowel changes.  Musculoskeletal: Negative for gait problem or joint swelling.  Skin: Negative for rash.  Neurological: Negative for dizziness or headache.  No other specific complaints in a complete review of systems (except as listed in HPI above).   Objective  Vitals:   09/20/17 1449  BP: 118/64  Pulse: 89  Resp: 16  Temp: 97.8 F (36.6 C)  TempSrc: Oral  SpO2: 95%  Weight: 189 lb 4.8 oz (85.9 kg)  Height: 5\' 1"  (1.549 m)     Body mass index is 35.77 kg/m.  Physical Exam  Constitutional: Patient appears well-developed and well-nourished. Obese  No distress.  HEENT: head atraumatic, normocephalic, pupils equal and reactive to light,  neck supple, throat within normal limits Cardiovascular: Normal rate, regular rhythm and normal heart sounds.  No murmur heard. No BLE edema. Pulmonary/Chest: Effort normal and breath sounds normal. No respiratory distress. Abdominal: Soft.  There is no tenderness. Psychiatric: Patient has a normal mood and affect. behavior is normal. Judgment and thought content normal.  Recent Results (from the past 2160 hour(s))  Glucose, Fasting     Status: None   Collection Time: 07/26/17  9:15 AM  Result Value Ref Range   Glucose, Fasting 89 65 - 99 mg/dL    PHQ2/9: Depression  screen [facility] 2/9 09/20/2017 11/02/2016 04/28/2016 08/22/2015  Decreased Interest 0 0 0 0  Down, Depressed, Hopeless 0 1 0 0  PHQ - 2 Score 0 1 0 0    Fall Risk: Fall Risk  09/20/2017 11/02/2016 04/28/2016 08/22/2015  Falls in the past year? No No No No    Functional Status Survey: Is the patient deaf or have difficulty hearing?: No Does the patient have difficulty seeing, even when wearing glasses/contacts?: No Does the patient have difficulty concentrating, remembering, or making decisions?: No Does the patient have difficulty walking or climbing stairs?: No Does the patient have difficulty dressing or bathing?: No Does the patient have difficulty doing errands alone such as visiting a doctor's office or shopping?: No   Assessment & Plan  1. Depression, major, in remission ([place])  She does not want to resume medication at this time, oncologist recommended to see if she would tolerate tamoxifen however patient refused to try medication or to go on Tamoxifen at this time, states she could not tolerate the side effects  2. 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, estrogen receptor  positive ([place])  Explained importance of trying to go back on Tamoxifen , but she refused  3. Persistent insomnia  Still has about one month for Ambien, she will contact mail order and I will refill medication for her from now on. Explained Ambien is not approved for females at 10 mg dose, she needs to try to go down, she is willing to try Trazodone and see if it works for her.   4. Vitamin D deficiency  - VITAMIN D 25 Hydroxy (Vit-D Deficiency, Fractures)  5. Encounter for screening for HIV  -HIV  6. Routine screening for STI (sexually transmitted infection)  -RPR  7. Long-term use of high-risk medication  - COMPLETE METABOLIC PANEL WITH GFR   8. Lipid screening  - Lipid panel  9. Screening for diabetes mellitus  - Hemoglobin A1c  10. Other fatigue  - Vitamin B12 - TSH  11. Gastroesophageal reflux disease without esophagitis  - omeprazole (PRILOSEC) 20 MG capsule; Take 1 capsule (20 mg total) by mouth daily.  Dispense: 90 capsule; Refill: 1

## 2017-09-20 NOTE — Addendum Note (Signed)
Addended by: Inda Coke on: 09/20/2017 03:33 PM   Modules accepted: Orders

## 2017-09-22 ENCOUNTER — Telehealth: Payer: Self-pay | Admitting: *Deleted

## 2017-09-22 ENCOUNTER — Other Ambulatory Visit: Payer: Self-pay | Admitting: *Deleted

## 2017-09-22 DIAGNOSIS — Z452 Encounter for adjustment and management of vascular access device: Secondary | ICD-10-CM

## 2017-09-22 NOTE — Telephone Encounter (Signed)
Referral sent 09/22/17 to Dr. Angie Fava office

## 2017-09-22 NOTE — Telephone Encounter (Signed)
Patient states order for port removal was to be sent to Dr Jacalyn Lefevre office but has not been received by them.

## 2017-10-06 ENCOUNTER — Other Ambulatory Visit: Payer: Self-pay | Admitting: *Deleted

## 2017-10-06 ENCOUNTER — Encounter: Payer: Self-pay | Admitting: *Deleted

## 2017-10-06 NOTE — Progress Notes (Signed)
Spoke with Dr. Rogue Bussing to obtain orders for labs on 11/20. Pt on schedule for lab encounter-do not have orders available. Reviewed chart- per Dr. Jacinto Reap - cnl lab encounter for 11/20

## 2017-10-10 ENCOUNTER — Encounter: Payer: Self-pay | Admitting: General Surgery

## 2017-10-10 ENCOUNTER — Ambulatory Visit: Payer: 59 | Admitting: General Surgery

## 2017-10-10 ENCOUNTER — Telehealth: Payer: Self-pay

## 2017-10-10 VITALS — BP 130/80 | HR 102 | Resp 14 | Ht 61.0 in | Wt 183.0 lb

## 2017-10-10 DIAGNOSIS — Z170 Estrogen receptor positive status [ER+]: Secondary | ICD-10-CM | POA: Diagnosis not present

## 2017-10-10 DIAGNOSIS — C50511 Malignant neoplasm of lower-outer quadrant of right female breast: Secondary | ICD-10-CM

## 2017-10-10 NOTE — Progress Notes (Signed)
Patient ID: Shelby Barry, female   DOB: 14-Jan-1966, 51 y.o.   MRN: [id number]  Chief Complaint  Patient presents with  . Procedure    HPI Shelby Barry is a 51 y.o. female here today for a left subclavian port removal. Denies any problems with the port.  HPI  Past Medical History:  Diagnosis Date  . Anemia   . Anxiety   . Bursitis of right shoulder   . Cancer [facility])    breast  . 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 Cataract, bilateral   . Chemotherapy induced nausea and vomiting   . Cold    states she has had it for 2 months  . Depression   . GERD (gastroesophageal reflux disease)    uses baking soda for symptoms  . Insomnia   . Lumbago   . Vitamin D deficiency     Past Surgical History:  Procedure Laterality Date  . ABDOMINAL HYSTERECTOMY  04/20/2014  . BILATERAL SALPINGECTOMY    . BREAST LUMPECTOMY WITH SENTINEL LYMPH NODE BIOPSY Right 10/12/2016   Procedure: BREAST LUMPECTOMY WITH SENTINEL LYMPH NODE BX;  Surgeon: Christene Lye, MD;  Location: [facility];  Service: General;  Laterality: Right;  . COLONOSCOPY WITH PROPOFOL N/A 05/20/2016   Procedure: COLONOSCOPY WITH PROPOFOL;  Surgeon: Lucilla Lame, MD;  Location: [place];  Service: Endoscopy;  Laterality: N/A;  . ECTOPIC PREGNANCY SURGERY    . ENDOMETRIAL ABLATION    . PORTACATH PLACEMENT Left 11/04/2016   Procedure: INSERTION PORT-A-CATH;  Surgeon: Christene Lye, MD;  Location: [facility];  Service: General;  Laterality: Left;  Left subclavian vein  . TUBAL LIGATION      Family History  Problem Relation Age of Onset  . Depression Father   . Obesity Father   . Alcohol abuse Brother   . Seizures Brother   . Breast cancer Sister 47    Social History Social History   Tobacco Use  . Smoking status: Former Smoker    Packs/day: 0.50    Years: 20.00    Pack years: 10.00    Types: Cigarettes    Last attempt to quit: 08/23/2016     Years since quitting: 1.1  . Smokeless tobacco: Never Used  Substance Use Topics  . Alcohol use: Yes    Alcohol/week: 0.0 oz    Comment: occassional  . Drug use: No    Allergies  Allergen Reactions  . Adhesive [Tape] Other (See Comments)    Blisters/rash  PLEASE USE PAPER TAPE ONLY  . Nickel     PT CAN WEAR GOLD, STERLING SILVER WITH NO PROBLEM-PT HAS NEVER HAD ANY PROBLEMS IN THE OR    Current Outpatient Medications  Medication Sig Dispense Refill  . acetaminophen (TYLENOL) 325 MG tablet Take 325 mg by mouth every 6 (six) hours as needed for fever.    . Bismuth Subsalicylate (PEPTO-BISMOL PO) Take 1 tablet by mouth daily as needed (indigestion).    . Investigational aspirin/placebo 300 MG tablet Alliance [id number] Take 1 tablet by mouth daily. Take with food or a full glass of water.  Do not crush enteric coated tablets. 180 tablet 0  . Multiple Vitamin (MULTIVITAMIN WITH MINERALS) TABS tablet Take 1 tablet by mouth daily. One-A-Day    . omeprazole (PRILOSEC) 20 MG capsule Take 1 capsule (20 mg total) by mouth daily. 90 capsule 1  . traZODone (DESYREL) 50 MG tablet Take 0.5-1 tablets (25-50  mg total) by mouth at bedtime as needed for sleep. 30 tablet 0  . anastrozole (ARIMIDEX) 1 MG tablet Take 1 tablet (1 mg total) by mouth daily. 30 tablet 4  . zolpidem (AMBIEN) 10 MG tablet Take 0.5-1 tablets (5-10 mg total) by mouth at bedtime as needed for sleep. 90 tablet 0   No current facility-administered medications for this visit.     Review of Systems Review of Systems  Constitutional: Negative.   Respiratory: Negative.   Cardiovascular: Negative.     Blood pressure 130/80, pulse (!) 102, resp. rate 14, height _0  (1.549 m), weight 183 lb (83 kg).  Physical Exam Physical Exam  Constitutional: She is oriented to person, place, and time. She appears well-developed and well-nourished.  HENT:  Head: Normocephalic and atraumatic.  Pulmonary/Chest: Effort normal.     Neurological: She is alert and oriented to person, place, and time.  Skin: Skin is warm and dry.  Psychiatric: She has a normal mood and affect. Her behavior is normal.    Data Reviewed Prior notes reviewed   Assessment    Right breast cancer, stage II, s/p chemoradiation, ER/PR positive HER-2/neu negative. Pt consented to in office port removal.   Procedure: In office left subclavian port removal 10cc local anesthetic of 1 % lidocaine and 0.5% bupivacaine mixture was instilled circumferentially around the left subclavian port site. The area was then prepped and draped in sterile fashion. A transverse incision was made along the original incision site and deepened through the subcutaneous tissue. The port was identified and the three sutures holding it in place were cut and removed. The port was then removed and all parts were confirmed complete and intact. The subcutaneous tissue was re-approximated with interrupted 3-0 vicryl.The skin was closed with subcuticular 4-0 vicryl. Incision was dressed with steri strips, telfa, and tegaderm. An ice pack was applied to the area. Pt tolerated the procedure well. Educated the patient on wound maintenance.      Plan     Return as needed. The patient is aware to call back for any questions or concerns.    HPI, Physical Exam, Assessment and Plan have been scribed under the direction and in the presence of Mckinley Jewel, MD  Gaspar Cola, CMA   I have completed the exam and reviewed the above documentation for accuracy and completeness.  I agree with the above.  Haematologist has been used and any errors in dictation or transcription are unintentional.  Seeplaputhur G. Jamal Collin, M.D., F.A.C.S.   Junie Panning G 10/17/2017, 1:12 PM

## 2017-10-10 NOTE — Patient Instructions (Signed)
Return as needed

## 2017-10-10 NOTE — Telephone Encounter (Signed)
I contacted this patient to see how I can be of assistance to her. She informed me that she was waiting to hear when her mammogram was scheduled. I told her that I seen where Dr. Rogue Bussing had placed an order for diagnostic bilateral imaging for [name] but it has not been scheduled. I encouraged her to give that office a call to see if they can hopefully get her in with her next appt.  Copied from [place] [phone]. Topic: Inquiry >> Oct 07, 2017  4:26 PM Shelby Barry, NT wrote: Reason for CRM: pt states she is supposed to get a mammogram done and she has not got a call back with that appt. She is calling to check up on it. Pt would like  call back whenever the status has been changed

## 2017-10-11 ENCOUNTER — Inpatient Hospital Stay: Payer: 59 | Attending: Internal Medicine | Admitting: Internal Medicine

## 2017-10-11 ENCOUNTER — Other Ambulatory Visit: Payer: 59

## 2017-10-11 ENCOUNTER — Encounter: Payer: Self-pay | Admitting: Internal Medicine

## 2017-10-11 VITALS — BP 130/85 | HR 112 | Temp 97.4°F | Resp 16 | Wt 187.2 lb

## 2017-10-11 DIAGNOSIS — Z7982 Long term (current) use of aspirin: Secondary | ICD-10-CM | POA: Insufficient documentation

## 2017-10-11 DIAGNOSIS — F17210 Nicotine dependence, cigarettes, uncomplicated: Secondary | ICD-10-CM | POA: Insufficient documentation

## 2017-10-11 DIAGNOSIS — F329 Major depressive disorder, single episode, unspecified: Secondary | ICD-10-CM | POA: Diagnosis not present

## 2017-10-11 DIAGNOSIS — C50511 Malignant neoplasm of lower-outer quadrant of right female breast: Secondary | ICD-10-CM | POA: Diagnosis present

## 2017-10-11 DIAGNOSIS — E559 Vitamin D deficiency, unspecified: Secondary | ICD-10-CM | POA: Insufficient documentation

## 2017-10-11 DIAGNOSIS — F419 Anxiety disorder, unspecified: Secondary | ICD-10-CM | POA: Insufficient documentation

## 2017-10-11 DIAGNOSIS — Z79899 Other long term (current) drug therapy: Secondary | ICD-10-CM | POA: Insufficient documentation

## 2017-10-11 DIAGNOSIS — K219 Gastro-esophageal reflux disease without esophagitis: Secondary | ICD-10-CM | POA: Insufficient documentation

## 2017-10-11 DIAGNOSIS — Z170 Estrogen receptor positive status [ER+]: Secondary | ICD-10-CM | POA: Insufficient documentation

## 2017-10-11 DIAGNOSIS — G4700 Insomnia, unspecified: Secondary | ICD-10-CM | POA: Diagnosis not present

## 2017-10-11 MED ORDER — ANASTROZOLE 1 MG PO TABS: 1.0000 mg | ORAL_TABLET | Freq: Every day | ORAL | 4 refills | Status: DC

## 2017-10-11 NOTE — Progress Notes (Signed)
While he was in [place]  Patient Care Team: Steele Sizer, MD as PCP - General (Family Medicine) Christene Lye, MD (General Surgery) Steele Sizer, MD as Attending Physician (Family Medicine)  CHIEF COMPLAINTS/PURPOSE OF CONSULTATION:  Breast cancer  #  Oncology History   # DEC 2017- RIGHT BREAST Stage II [pT1cpN1sn; screening] ER/PR- Pos- 90%; her 2 NEU- NEG s/p Lumpect & SLNBx; Dr.Sankar ]; JAN 2018- ddAC -T x12. AUG 2nd [finished RT].  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  female above history of ER/PR positive HER-2/neu negative stage II breast cancer; most recently on tamoxifen is here for follow-up.  Tamoxifen was discontinued at last visit approximately 4 weeks ago because of significant mood swings arthralgias.   Today patient feels much better-she states she is able to function back to her normal self.  Denies any arthritic pain.  Mood swings improved.   ROS: A complete 10 point review of system is done which is negative except mentioned above in history of present illness  MEDICAL HISTORY:  Past Medical History:  Diagnosis Date  . Anemia   . Anxiety   . Bursitis of right shoulder   . Cancer [facility])    breast  . 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 Cataract, bilateral   . Chemotherapy induced nausea and vomiting   . Cold    states she has had it for 2 months  . Depression   . GERD (gastroesophageal reflux disease)    uses baking soda for symptoms  . Insomnia   .  Lumbago   . Vitamin D deficiency     SURGICAL HISTORY: Past Surgical History:  Procedure Laterality Date  . ABDOMINAL HYSTERECTOMY  04/20/2014  . BILATERAL SALPINGECTOMY    . BREAST LUMPECTOMY WITH SENTINEL LYMPH NODE BIOPSY Right 10/12/2016   Procedure: BREAST LUMPECTOMY WITH SENTINEL LYMPH NODE BX;  Surgeon: Christene Lye, MD;  Location: [facility];  Service: General;  Laterality: Right;  . COLONOSCOPY WITH PROPOFOL N/A 05/20/2016   Procedure: COLONOSCOPY WITH PROPOFOL;  Surgeon: Lucilla Lame, MD;  Location: [place];  Service: Endoscopy;  Laterality: N/A;  . ECTOPIC PREGNANCY SURGERY    . ENDOMETRIAL ABLATION    . PORTACATH PLACEMENT Left 11/04/2016   Procedure: INSERTION PORT-A-CATH;  Surgeon: Christene Lye, MD;  Location: [facility];  Service: General;  Laterality: Left;  Left subclavian vein  . TUBAL LIGATION      SOCIAL HISTORY: [employer]; in [place]; 1 pack/day; occassional alochol.  2 grown- children- 22 boy & 59 daughter Social History   Socioeconomic History  . Marital status: Divorced    Spouse name: Not on file  . Number of children: Not on file  . Years of education: Not on file  . Highest education level: Not on file  Social Needs  . Financial resource strain: Not on file  . Food insecurity - worry: Not on file  . Food insecurity - inability: Not on file  . Transportation needs - medical: Not on file  . Transportation needs -  non-medical: Not on file  Occupational History  . Not on file  Tobacco Use  . Smoking status: Former Smoker    Packs/day: 0.50    Years: 20.00    Pack years: 10.00    Types: Cigarettes    Last attempt to quit: 08/23/2016    Years since quitting: 1.1  . Smokeless tobacco: Never Used  Substance and Sexual Activity  . Alcohol use: Yes    Alcohol/week: 0.0 oz    Comment: occassional  . Drug use: No  . Sexual activity: Yes    Partners: Male  Other Topics Concern  . Not on file  Social History Narrative   She  is living with [name] ( boyfriend ) for the past two years, and his 64 yo daughter just moved in with them Fall 2017. Her mother is doing drugs.    Works as [employer]    FAMILY HISTORY: 2 sisters- one sister breast ca at ? 38y; other sister- ? Cancer; aunt/mom's sister- stomach cancer [60s].  Family History  Problem Relation Age of Onset  . Depression Father   . Obesity Father   . Alcohol abuse Brother   . Seizures Brother   . Breast cancer Sister 56    ALLERGIES:  is allergic to adhesive [tape] and nickel.  MEDICATIONS:  Current Outpatient Medications  Medication Sig Dispense Refill  . acetaminophen (TYLENOL) 325 MG tablet Take 325 mg by mouth every 6 (six) hours as needed for fever.    . Bismuth Subsalicylate (PEPTO-BISMOL PO) Take 1 tablet by mouth daily as needed (indigestion).    . Investigational aspirin/placebo 300 MG tablet Alliance [id number] Take 1 tablet by mouth daily. Take with food or a full glass of water.  Do not crush enteric coated tablets. 180 tablet 0  . Multiple Vitamin (MULTIVITAMIN WITH MINERALS) TABS tablet Take 1 tablet by mouth daily. One-A-Day    . omeprazole (PRILOSEC) 20 MG capsule Take 1 capsule (20 mg total) by mouth daily. 90 capsule 1  . traZODone (DESYREL) 50 MG tablet Take 0.5-1 tablets (25-50 mg total) by mouth at bedtime as needed for sleep. 30 tablet 0  . anastrozole (ARIMIDEX) 1 MG tablet Take 1 tablet (1 mg total) by mouth daily. 30 tablet 4  . zolpidem (AMBIEN) 10 MG tablet Take 0.5-1 tablets (5-10 mg total) by mouth at bedtime as needed for sleep. 90 tablet 0   No current facility-administered medications for this visit.       Marland Kitchen  PHYSICAL EXAMINATION: ECOG PERFORMANCE STATUS: 0 - Asymptomatic  Vitals:   10/11/17 1351  BP: 130/85  Pulse: (!) 112  Resp: 16  Temp: (!) 97.4 F (36.3 C)   Filed Weights   10/11/17 1351  Weight: 187 lb 2.7 oz (84.9 kg)    GENERAL: Well-nourished well-developed; Alert, no distress and comfortable. Alone.    EYES: no pallor or icterus OROPHARYNX: no thrush or ulceration; good dentition  NECK: supple, no masses felt LYMPH:  no palpable lymphadenopathy in the cervical, axillary or inguinal regions LUNGS: clear to auscultation and  No wheeze or crackles HEART/CVS: regular rate & rhythm and no murmurs; No lower extremity edema ABDOMEN: abdomen soft, non-tender and normal bowel sounds Musculoskeletal:no cyanosis of digits and no clubbing  PSYCH: alert & oriented x 3 with fluent speech NEURO: no focal motor/sensory deficits SKIN:  no rashes or significant lesions  LABORATORY DATA:  I have reviewed the data as listed Lab Results  Component Value Date   WBC 4.9  05/31/2017   HGB 13.3 05/31/2017   HCT 39.7 05/31/2017   MCV 85.9 05/31/2017   PLT 233 05/31/2017   Recent Labs    03/28/17 1249 04/05/17 1318 04/12/17 1502 05/31/17 1133  NA 138 135 135 139  K 3.7 4.3 4.8 3.9  CL 105 100* 99* 106  CO2 _0 GLUCOSE 100* 99 105* 118*  BUN _1 CREATININE 0.60 0.68 0.68 0.62  CALCIUM 8.8* 9.3 9.3 9.0  GFRNONAA >60 >60 >60 >60  GFRAA >60 >60 >60 >60  PROT 6.4* 7.3  --  6.3*  ALBUMIN 3.6 4.0  --  3.7  AST 29 31  --  24  ALT 39 45  --  25  ALKPHOS 91 99  --  88  BILITOT 0.4 0.3  --  0.4    RADIOGRAPHIC STUDIES: I have personally reviewed the radiological images as listed and agreed with the findings in the report. No results found.  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 PT1cpN1sn. Currently s/p  Adjuvant chemotherapy with Adriamycin-Cytoxan- Taxol w x12 [finished may 2018]. s/p RT [finished aug 2nd].   # no clinical evidence of disease. Discontinue Tamxofen [sec to intol]. Start Arimidex. Discussed the side effects in detail.   # Patient interested in couple of clinical trials- including aspirin; and also clinical trial involving  lifestyle changes.  Enrolled in both clinical trials.  # Anxiety/insomnia- improved; on trazadone.   # follow up in 4 weeks/ no labs. Pt will call re: if costs an issue.     Cammie Sickle, MD 10/13/2017 4:55 AM

## 2017-10-11 NOTE — Assessment & Plan Note (Addendum)
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 PT1cpN1sn.  Status post chemotherapy followed by radiation.  # no clinical evidence of disease. Discontinue Tamxofen [sec to intol]. Start Arimidex. Discussed the side effects in detail.  Prescription sent over to pharmacy.  # Patient interested in couple of clinical trials- including aspirin; and also clinical trial involving lifestyle changes.  Enrolled in both clinical trials.  # Anxiety/insomnia- improved; on trazadone.   # follow up in 4 weeks/ no labs. Pt will call re: if costs an issue.   # 25 minutes face-to-face with the patient discussing the above plan of care; more than 50% of time spent on prognosis/ natural history; counseling and coordination.

## 2017-11-04 ENCOUNTER — Ambulatory Visit
Admission: RE | Admit: 2017-11-04 | Discharge: 2017-11-04 | Disposition: A | Discharge status: Home / Self Care | Payer: 59 | Source: Ambulatory Visit | Attending: Internal Medicine | Admitting: Internal Medicine

## 2017-11-04 DIAGNOSIS — C50511 Malignant neoplasm of lower-outer quadrant of right female breast: Secondary | ICD-10-CM

## 2017-11-04 DIAGNOSIS — E669 Obesity, unspecified: Secondary | ICD-10-CM | POA: Diagnosis present

## 2017-11-04 DIAGNOSIS — Z170 Estrogen receptor positive status [ER+]: Secondary | ICD-10-CM | POA: Diagnosis present

## 2017-11-04 DIAGNOSIS — Z9889 Other specified postprocedural states: Secondary | ICD-10-CM | POA: Insufficient documentation

## 2017-11-04 HISTORY — DX: Malignant neoplasm of unspecified site of unspecified female breast: C50.919

## 2017-11-04 HISTORY — DX: Personal history of irradiation: Z92.3

## 2017-11-04 HISTORY — DX: Personal history of antineoplastic chemotherapy: Z92.21

## 2017-11-08 ENCOUNTER — Inpatient Hospital Stay: Payer: 59 | Attending: Internal Medicine | Admitting: Internal Medicine

## 2017-11-08 VITALS — BP 140/84 | HR 102 | Temp 97.1°F | Resp 16 | Wt 186.4 lb

## 2017-11-08 DIAGNOSIS — F329 Major depressive disorder, single episode, unspecified: Secondary | ICD-10-CM | POA: Insufficient documentation

## 2017-11-08 DIAGNOSIS — G4700 Insomnia, unspecified: Secondary | ICD-10-CM | POA: Diagnosis not present

## 2017-11-08 DIAGNOSIS — K219 Gastro-esophageal reflux disease without esophagitis: Secondary | ICD-10-CM | POA: Insufficient documentation

## 2017-11-08 DIAGNOSIS — E559 Vitamin D deficiency, unspecified: Secondary | ICD-10-CM | POA: Insufficient documentation

## 2017-11-08 DIAGNOSIS — Z79811 Long term (current) use of aromatase inhibitors: Secondary | ICD-10-CM | POA: Insufficient documentation

## 2017-11-08 DIAGNOSIS — F419 Anxiety disorder, unspecified: Secondary | ICD-10-CM | POA: Diagnosis not present

## 2017-11-08 DIAGNOSIS — Z79899 Other long term (current) drug therapy: Secondary | ICD-10-CM | POA: Insufficient documentation

## 2017-11-08 DIAGNOSIS — Z7982 Long term (current) use of aspirin: Secondary | ICD-10-CM | POA: Diagnosis not present

## 2017-11-08 DIAGNOSIS — C50511 Malignant neoplasm of lower-outer quadrant of right female breast: Secondary | ICD-10-CM

## 2017-11-08 DIAGNOSIS — Z9221 Personal history of antineoplastic chemotherapy: Secondary | ICD-10-CM | POA: Insufficient documentation

## 2017-11-08 DIAGNOSIS — Z170 Estrogen receptor positive status [ER+]: Secondary | ICD-10-CM | POA: Insufficient documentation

## 2017-11-08 DIAGNOSIS — F17210 Nicotine dependence, cigarettes, uncomplicated: Secondary | ICD-10-CM | POA: Diagnosis not present

## 2017-11-08 NOTE — Assessment & Plan Note (Addendum)
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 PT1cpN1sn.  Status post chemotherapy followed by radiation.  Discontinued tamoxifen because of intolerance.  Recent mammogram December 2018 normal.  #Patient given a prescription for Arimidex; has not started "concerned about the side effects".  # Had a long discussion with the patient regarding compliance; and the importance of antihormone therapy and prevention of recurrence of breast cancer.  Also discussed that blood work/scans-will only help in diagnosing recurrence; but then it would be incurable cancer at the time.  Stressed at length regarding-compliance.  # Patient interested in couple of clinical trials- including aspirin; and also clinical trial involving lifestyle changes.  Enrolled in both clinical trials.  # Anxiety/insomnia- improved; on trazadone.   # 25 minutes face-to-face with the patient discussing the above plan of care; more than 50% of time spent on prognosis/ natural history; counseling and coordination. 

## 2017-11-08 NOTE — Progress Notes (Signed)
While he was in [place]  Patient Care Team: Steele Sizer, MD as PCP - General (Family Medicine) Christene Lye, MD (General Surgery) Steele Sizer, MD as Attending Physician (Family Medicine)  CHIEF COMPLAINTS/PURPOSE OF CONSULTATION:  Breast cancer  #  Oncology History   # DEC 2017- RIGHT BREAST Stage II [pT1cpN1sn; screening] ER/PR- Pos- 90%; her 2 NEU- NEG s/p Lumpect & SLNBx; Dr.Sankar ]; JAN 2018- ddAC -T x12. AUG 2nd [finished RT].  # AUG 21st 2018- Start TAM; Nov 2018- Stopped sec intol/mood swings; arthralgias]; Nov 2018- Arimidex; DID NOT START   ---------------------------------------------------------  # MRI liver- hemagiomas  # Genetic counseling- DICER-1 VUS**  # TAH & BSO [Dr.Hall; [facility]; 1694]; smoking     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, estrogen receptor positive ([place])     HISTORY OF PRESENTING ILLNESS:  Shelby Barry 51 y.o.  female above history of ER/PR positive HER-2/neu negative stage II breast cancer; most recently on tamoxifen is here for follow-up.  Tamoxifen was discontinued at the end of November because of intolerance.  Patient given bilateral BSO-started on Arimidex.   Patient has not started Arimidex as recommended because she was concerned about the potential side effects of fatigue.   Today patient feels much better-she states she is able to function back to her normal self.  Denies any arthritic pain.  Mood swings improved.   ROS: A complete 10 point review of system is done which is negative except mentioned above in history of present illness  MEDICAL HISTORY:  Past Medical History:  Diagnosis Date  . Anemia   . Anxiety   . Breast cancer ([place]) 09/2016  . Bursitis of right shoulder   . Cancer [facility])    breast  . 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, estrogen receptor positive ([place])   . Cataract, bilateral   . Chemotherapy induced  nausea and vomiting   . Cold    states she has had it for 2 months  . Depression   . GERD (gastroesophageal reflux disease)    uses baking soda for symptoms  . Insomnia   . Lumbago   . Personal history of chemotherapy 11/22/2016  . Personal history of radiation therapy 01/20/2017  . Vitamin D deficiency     SURGICAL HISTORY: Past Surgical History:  Procedure Laterality Date  . ABDOMINAL HYSTERECTOMY  04/20/2014  . BILATERAL SALPINGECTOMY    . BREAST BIOPSY Right 09/27/2016   positive  . BREAST LUMPECTOMY Right 10/12/2016   positive  . BREAST LUMPECTOMY WITH SENTINEL LYMPH NODE BIOPSY Right 10/12/2016   Procedure: BREAST LUMPECTOMY WITH SENTINEL LYMPH NODE BX;  Surgeon: Christene Lye, MD;  Location: [facility];  Service: General;  Laterality: Right;  . COLONOSCOPY WITH PROPOFOL N/A 05/20/2016   Procedure: COLONOSCOPY WITH PROPOFOL;  Surgeon: Lucilla Lame, MD;  Location: [facility];  Service: Endoscopy;  Laterality: N/A;  . ECTOPIC PREGNANCY SURGERY    . ENDOMETRIAL ABLATION    . PORTACATH PLACEMENT Left 11/04/2016   Procedure: INSERTION PORT-A-CATH;  Surgeon: Christene Lye, MD;  Location: [facility];  Service: General;  Laterality: Left;  Left subclavian vein  . TUBAL LIGATION      SOCIAL HISTORY: [employer]; in [place]; 1 pack/day; occassional alochol.  2 grown- children- 40 boy & 36 daughter Social History   Socioeconomic History  . Marital status: Divorced    Spouse name: Not on file  . Number  of children: Not on file  . Years of education: Not on file  . Highest education level: Not on file  Social Needs  . Financial resource strain: Not on file  . Food insecurity - worry: Not on file  . Food insecurity - inability: Not on file  . Transportation needs - medical: Not on file  . Transportation needs - non-medical: Not on file  Occupational History  . Not on file  Tobacco Use  . Smoking status: Former Smoker    Packs/day: 0.50    Years: 20.00     Pack years: 10.00    Types: Cigarettes    Last attempt to quit: 08/23/2016    Years since quitting: 1.2  . Smokeless tobacco: Never Used  Substance and Sexual Activity  . Alcohol use: Yes    Alcohol/week: 0.0 oz    Comment: occassional  . Drug use: No  . Sexual activity: Yes    Partners: Male  Other Topics Concern  . Not on file  Social History Narrative   She is living with [name] ( boyfriend ) for the past two years, and his 32 yo daughter just moved in with them Fall 2017. Her mother is doing drugs.    Works as [employer]    FAMILY HISTORY: 2 sisters- one sister breast ca at ? 14y; other sister- ? Cancer; aunt/mom's sister- stomach cancer [60s].  Family History  Problem Relation Age of Onset  . Depression Father   . Obesity Father   . Alcohol abuse Brother   . Seizures Brother   . Breast cancer Sister 66    ALLERGIES:  is allergic to adhesive [tape] and nickel.  MEDICATIONS:  Current Outpatient Medications  Medication Sig Dispense Refill  . acetaminophen (TYLENOL) 325 MG tablet Take 325 mg by mouth every 6 (six) hours as needed for fever.    . Bismuth Subsalicylate (PEPTO-BISMOL PO) Take 1 tablet by mouth daily as needed (indigestion).    . Investigational aspirin/placebo 300 MG tablet [facility] [id number] Take 1 tablet by mouth daily. Take with food or a full glass of water.  Do not crush enteric coated tablets. 180 tablet 0  . Multiple Vitamin (MULTIVITAMIN WITH MINERALS) TABS tablet Take 1 tablet by mouth daily. One-A-Day    . omeprazole (PRILOSEC) 20 MG capsule Take 1 capsule (20 mg total) by mouth daily. 90 capsule 1  . anastrozole (ARIMIDEX) 1 MG tablet Take 1 tablet (1 mg total) by mouth daily. (Patient not taking: Reported on 11/08/2017) 30 tablet 4  . traZODone (DESYREL) 50 MG tablet Take 0.5-1 tablets (25-50 mg total) by mouth at bedtime as needed for sleep. (Patient not taking: Reported on 11/08/2017) 30 tablet 0  . zolpidem (AMBIEN) 10 MG tablet Take 0.5-1 tablets  (5-10 mg total) by mouth at bedtime as needed for sleep. (Patient not taking: Reported on 11/08/2017) 90 tablet 0   No current facility-administered medications for this visit.       Marland Kitchen  PHYSICAL EXAMINATION: ECOG PERFORMANCE STATUS: 0 - Asymptomatic  Vitals:   11/08/17 1455  BP: 140/84  Pulse: (!) 102  Resp: 16  Temp: (!) 97.1 F (36.2 C)   Filed Weights   11/08/17 1455  Weight: 186 lb 6.4 oz (84.5 kg)    GENERAL: Well-nourished well-developed; Alert, no distress and comfortable. Alone.   EYES: no pallor or icterus OROPHARYNX: no thrush or ulceration; good dentition  NECK: supple, no masses felt LYMPH:  no palpable lymphadenopathy in the cervical, axillary or  inguinal regions LUNGS: clear to auscultation and  No wheeze or crackles HEART/CVS: regular rate & rhythm and no murmurs; No lower extremity edema ABDOMEN: abdomen soft, non-tender and normal bowel sounds Musculoskeletal:no cyanosis of digits and no clubbing  PSYCH: alert & oriented x 3 with fluent speech NEURO: no focal motor/sensory deficits SKIN:  no rashes or significant lesions  LABORATORY DATA:  I have reviewed the data as listed Lab Results  Component Value Date   WBC 4.9 05/31/2017   HGB 13.3 05/31/2017   HCT 39.7 05/31/2017   MCV 85.9 05/31/2017   PLT 233 05/31/2017   Recent Labs    03/28/17 1249 04/05/17 1318 04/12/17 1502 05/31/17 1133  NA 138 135 135 139  K 3.7 4.3 4.8 3.9  CL 105 100* 99* 106  CO2 _0 GLUCOSE 100* 99 105* 118*  BUN _1 CREATININE 0.60 0.68 0.68 0.62  CALCIUM 8.8* 9.3 9.3 9.0  GFRNONAA >60 >60 >60 >60  GFRAA >60 >60 >60 >60  PROT 6.4* 7.3  --  6.3*  ALBUMIN 3.6 4.0  --  3.7  AST 29 31  --  24  ALT 39 45  --  25  ALKPHOS 91 99  --  88  BILITOT 0.4 0.3  --  0.4    RADIOGRAPHIC STUDIES: I have personally reviewed the radiological images as listed and agreed with the findings in the report. Mm Diag Breast Tomo Bilateral  Result Date:  11/04/2017 CLINICAL DATA:  Patient presents for a bilateral diagnostic examination due to previous right malignant lumpectomy 2017. EXAM: 2D DIGITAL DIAGNOSTIC BILATERAL MAMMOGRAM WITH CAD AND ADJUNCT TOMO COMPARISON:  Previous exam(s). ACR Breast Density Category b: There are scattered areas of fibroglandular density. FINDINGS: Examination demonstrates expected post lumpectomy changes over the outer central portion of the right breast as well as post radiation change. Remainder of the exam is unchanged. Mammographic images were processed with CAD. IMPRESSION: Expected post lumpectomy changes of the right breast. RECOMMENDATION: Recommend continued annual bilateral diagnostic mammographic evaluation. I have discussed the findings and recommendations with the patient. Results were also provided in writing at the conclusion of the visit. If applicable, a reminder letter will be sent to the patient regarding the next appointment. BI-RADS CATEGORY  2: Benign. Electronically Signed   By: Marin Olp M.D.   On: 11/04/2017 13:25    ASSESSMENT & PLAN:   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, estrogen receptor positive ([place]) # Breast cancer-stage II ER/PR positive HER-2/neu negative. PT1cpN1sn.  Status post chemotherapy followed by radiation.  Discontinued tamoxifen because of intolerance.  Recent mammogram December 2018 normal.  #Patient given a prescription for Arimidex; has not started "concerned about the side effects".  # Had a long discussion with the patient regarding compliance; and the importance of antihormone therapy and prevention of recurrence of breast cancer.  Also discussed that blood work/scans-will only help in diagnosing recurrence; but then it would be incurable cancer at the time.  Stressed at length regarding-compliance.  # Patient interested in couple of clinical trials- including aspirin; and also clinical trial involving lifestyle changes.  Enrolled in both  clinical trials.  # Anxiety/insomnia- improved; on trazadone.   # 25 minutes face-to-face with the patient discussing the above plan of care; more than 50% of time spent on prognosis/ natural history; counseling and coordination.    Cammie Sickle, MD 11/08/2017 3:18 PM

## 2017-11-09 ENCOUNTER — Encounter: Payer: Self-pay | Admitting: Family Medicine

## 2017-11-09 ENCOUNTER — Ambulatory Visit (INDEPENDENT_AMBULATORY_CARE_PROVIDER_SITE_OTHER): Payer: 59 | Admitting: Family Medicine

## 2017-11-09 ENCOUNTER — Encounter: Payer: Self-pay | Admitting: *Deleted

## 2017-11-09 VITALS — BP 130/80 | HR 106 | Temp 98.2°F | Resp 18 | Ht 62.75 in | Wt 186.0 lb

## 2017-11-09 DIAGNOSIS — C50511 Malignant neoplasm of lower-outer quadrant of right female breast: Secondary | ICD-10-CM

## 2017-11-09 DIAGNOSIS — Z5181 Encounter for therapeutic drug level monitoring: Secondary | ICD-10-CM

## 2017-11-09 DIAGNOSIS — Z79899 Other long term (current) drug therapy: Secondary | ICD-10-CM

## 2017-11-09 DIAGNOSIS — E2839 Other primary ovarian failure: Secondary | ICD-10-CM | POA: Diagnosis not present

## 2017-11-09 DIAGNOSIS — Z01411 Encounter for gynecological examination (general) (routine) with abnormal findings: Secondary | ICD-10-CM | POA: Diagnosis not present

## 2017-11-09 DIAGNOSIS — F332 Major depressive disorder, recurrent severe without psychotic features: Secondary | ICD-10-CM | POA: Diagnosis not present

## 2017-11-09 DIAGNOSIS — Z23 Encounter for immunization: Secondary | ICD-10-CM | POA: Diagnosis not present

## 2017-11-09 DIAGNOSIS — Z01419 Encounter for gynecological examination (general) (routine) without abnormal findings: Secondary | ICD-10-CM

## 2017-11-09 DIAGNOSIS — G4700 Insomnia, unspecified: Secondary | ICD-10-CM

## 2017-11-09 DIAGNOSIS — Z113 Encounter for screening for infections with a predominantly sexual mode of transmission: Secondary | ICD-10-CM | POA: Diagnosis not present

## 2017-11-09 DIAGNOSIS — Z170 Estrogen receptor positive status [ER+]: Principal | ICD-10-CM

## 2017-11-09 MED ORDER — DULOXETINE HCL 30 MG PO CPEP: 30.0000 mg | ORAL_CAPSULE | Freq: Every day | ORAL | 0 refills | Status: DC

## 2017-11-09 MED ORDER — QUETIAPINE FUMARATE 25 MG PO TABS: 25.0000 mg | ORAL_TABLET | Freq: Every day | ORAL | 2 refills | Status: DC

## 2017-11-09 NOTE — Progress Notes (Signed)
Shelby Barry had a 4 week follow up appointment with Dr. Rogue Bussing on 11/08/17.  Previously, Shelby Barry had stopped taking her Tamoxifen due to side effects of bone pain and malaise.  Four weeks ago she had seen Dr. Rogue Bussing and he had switched her medication to Arimidex.  The visit yesterday was to ascertain how Shelby Barry was doing on the new medication.  The patient admitted that she had purchased the Arimidex but had not started taking it yet.  Dr. Rogue Bussing stressed the importance of this medication to prevent cancer recurrence.   I met with Shelby Barry yesterday to see how she was doing on her ABC and BWEL study regimens.  This was not a scheduled research visit.  At this time, Shelby Barry stated that she was doing fine with both studies and I collected her previous 3 months of drug diaries for ABC. She had called me earlier in the day to say that she was almost out of her medication for the ABC study and I informed her that she was given 6 months of medication.  She called me back and said that she had found her 2nd bottle of medication.  After further discussion with Shelby Barry after her MD visit, she is still uncertain about whether or not she will take the Arimidex.  I also briefly reviewed the solicited AE's required by the ABC study and the patient denied having any problems.  She did tell Dr. Rogue Bussing that since her chemotherapy when she eats certain foods or overeats, she will get nauseated and vomit.  She immediately feels better afterward.  She stated this had begun before her current study protocols started.  She still has her gallbladder and has had no known problems with cholelithiasis.  Instructed the patient to call if she had any further questions or problems.  I will plan to see her for her 6 month follow up for both studies on 02/08/18. Raynelle Dick, RN BSN "11/09/2017 1:42 PM"

## 2017-11-09 NOTE — Progress Notes (Signed)
Name: Shelby Barry   MRN: [id number]    DOB: 07-29-1966   Date:11/09/2017       Progress Note  Subjective  Chief Complaint  Chief Complaint  Patient presents with  . Annual Exam    HPI   Patient presents for annual CPE and discuss insomnia  Insomnia: she has been taking Ambien 10 mg and takes half at bed time and another half during the middle of the night. She states CR does not work for her and Trazodone made her feel weird. We will try Seroquel  Depression: long history and did not like Celexa, or Lexapro we discussed options and she is willing to try Cymbalta and return in one month for follow up. Feels like her life does not have a purpose, but at the same time wants to help her 23 yo step-child. Affecting her mood, her energy level, inability to sleep and has been overeating. Still goes to work and maintains her house  Diet: discussed life style modification  Exercise: started a program through the cancer center  USPSTF grade A and B recommendations  Depression:  Depression screen [facility] 2/9 11/09/2017 11/09/2017 09/20/2017 11/02/2016 04/28/2016  Decreased Interest 3 3 0 0 0  Down, Depressed, Hopeless 3 2 0 1 0  PHQ - 2 Score 6 5 0 1 0  Altered sleeping 3 - - - -  Tired, decreased energy 3 - - - -  Change in appetite 3 - - - -  Feeling bad or failure about yourself  3 - - - -  Trouble concentrating 3 - - - -  Moving slowly or fidgety/restless 0 - - - -  Suicidal thoughts 0 - - - -  PHQ-9 Score 21 - - - -  Difficult doing work/chores Very difficult - - - -   Hypertension: BP Readings from Last 3 Encounters:  11/09/17 130/80  11/08/17 140/84  10/11/17 130/85   Obesity: Wt Readings from Last 3 Encounters:  11/09/17 186 lb (84.4 kg)  11/08/17 186 lb 6.4 oz (84.5 kg)  10/11/17 187 lb 2.7 oz (84.9 kg)   BMI Readings from Last 3 Encounters:  11/09/17 33.21 kg/m  11/08/17 35.22 kg/m  10/11/17 35.37 kg/m    Alcohol: none Tobacco use: none HIV, hep B, hep C:  needs to have labs done STD testing and prevention (chl/gon/syphilis): ordered on previous labs  Intimate partner violence:negative Sexual History/Pain during Intercourse: no Menstrual History/LMP/Abnormal Bleeding: s/p hysterectomy  Incontinence Symptoms: only when she coughs a lot     Advanced Care Planning: A voluntary discussion about advance care planning including the explanation and discussion of advance directives.  Discussed health care proxy and Living will, and the patient was able to identify a health care proxy as   Patient does have a living will at present time. If patient does have living will, I have requested they bring this to the clinic to be scanned in to their chart.  Breast cancer:  HM Mammogram  Date Value Ref Range Status  02/21/2012 normal  Final    BRCA gene screening: had it at hematologist  Cervical cancer screening: no uterus   Osteoporosis:  No results found for: HMDEXASCAN   Lipids:  No results found for: CHOL No results found for: HDL No results found for: LDLCALC No results found for: TRIG No results found for: CHOLHDL No results found for: LDLDIRECT  Glucose:  Glucose, Bld  Date Value Ref Range Status  05/31/2017 118 (H) 65 - 99  mg/dL Final  04/12/2017 105 (H) 65 - 99 mg/dL Final  04/05/2017 99 65 - 99 mg/dL Final    Colorectal cancer: 51 yo Lung cancer:   Low Dose CT Chest recommended if Age 52-80 years, 30 pack-year currently smoking OR have quit w/in 15years. Patient does not qualify.   Aspirin: cannot - she is in a clinical trial - cancer center [facility] visit, she is in a hurry today    Patient Active Problem List   Diagnosis Date Noted  . Liver lesion 11/09/2016  . Cough in adult 10/29/2016  . 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 Abnormal mammogram 09/22/2016  . Recurrent major depression-severe ([place]) 08/22/2015  . Bursitis of shoulder 08/22/2015  .  Bilateral cataracts 08/22/2015  . Circadian rhythm sleep disorder, 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  . Vitamin D deficiency 08/22/2015  . LBP (low back pain) 08/22/2015  . GAD (generalized anxiety disorder) 08/22/2015  . Tobacco use 08/22/2015  . History of anemia 08/17/2007  . Obesity, Class II, BMI 35-39.9, no comorbidity 08/16/2007  . Insomnia 08/16/2007    Past Surgical History:  Procedure Laterality Date  . ABDOMINAL HYSTERECTOMY  04/20/2014  . BILATERAL SALPINGECTOMY    . BREAST BIOPSY Right 09/27/2016   positive  . BREAST LUMPECTOMY Right 10/12/2016   positive  . BREAST LUMPECTOMY WITH SENTINEL LYMPH NODE BIOPSY Right 10/12/2016   Procedure: BREAST LUMPECTOMY WITH SENTINEL LYMPH NODE BX;  Surgeon: Christene Lye, MD;  Location: [facility];  Service: General;  Laterality: Right;  . COLONOSCOPY WITH PROPOFOL N/A 05/20/2016   Procedure: COLONOSCOPY WITH PROPOFOL;  Surgeon: Lucilla Lame, MD;  Location: [place];  Service: Endoscopy;  Laterality: N/A;  . ECTOPIC PREGNANCY SURGERY    . ENDOMETRIAL ABLATION    . PORTACATH PLACEMENT Left 11/04/2016   Procedure: INSERTION PORT-A-CATH;  Surgeon: Christene Lye, MD;  Location: [facility];  Service: General;  Laterality: Left;  Left subclavian vein  . TUBAL LIGATION      Family History  Problem Relation Age of Onset  . Depression Father   . Obesity Father   . Alcohol abuse Brother   . Seizures Brother   . Breast cancer Sister 40    Social History   Socioeconomic History  . Marital status: Significant Other    Spouse name: John  . Number of children: 2  . Years of education: Not on file  . Highest education level: Associate degree: academic program  Social Needs  . Financial resource strain: Not hard at all  . Food insecurity - worry: Never true  . Food insecurity - inability: Never true  . Transportation needs - medical: No  . Transportation needs - non-medical: No  Occupational History  . Occupation:  specimen process specialist    Employer: [employer]  Tobacco Use  . Smoking status: Former Smoker    Packs/day: 0.50    Years: 20.00    Pack years: 10.00    Types: Cigarettes    Last attempt to quit: 08/23/2016    Years since quitting: 1.2  . Smokeless tobacco: Never Used  Substance and Sexual Activity  . Alcohol use: Yes    Alcohol/week: 0.0 oz    Comment: occassional  . Drug use: No  . Sexual activity: Yes    Partners: Male  Other Topics Concern  . Not on file  Social History Narrative   She is living with  [name] ( boyfriend ) for  the past two years, and his 39 yo daughter just moved in with them Fall 2017. Her mother is doing drugs.    Works as [employer]     Current Outpatient Medications:  .  acetaminophen (TYLENOL) 325 MG tablet, Take 325 mg by mouth every 6 (six) hours as needed for fever., Disp: , Rfl:  .  Investigational aspirin/placebo 300 MG tablet [facility] [id number], Take 1 tablet by mouth daily. Take with food or a full glass of water.  Do not crush enteric coated tablets., Disp: 180 tablet, Rfl: 0 .  Multiple Vitamin (MULTIVITAMIN WITH MINERALS) TABS tablet, Take 1 tablet by mouth daily. One-A-Day, Disp: , Rfl:  .  omeprazole (PRILOSEC) 20 MG capsule, Take 1 capsule (20 mg total) by mouth daily., Disp: 90 capsule, Rfl: 1 .  anastrozole (ARIMIDEX) 1 MG tablet, Take 1 tablet (1 mg total) by mouth daily. (Patient not taking: Reported on 11/08/2017), Disp: 30 tablet, Rfl: 4 .  Bismuth Subsalicylate (PEPTO-BISMOL PO), Take 1 tablet by mouth daily as needed (indigestion)., Disp: , Rfl:  .  DULoxetine (CYMBALTA) 30 MG capsule, Take 1-2 capsules (30-60 mg total) by mouth daily. Start at 30 mg in am and increase to two caps daily after 7 days, Disp: 60 capsule, Rfl: 0 .  QUEtiapine (SEROQUEL) 25 MG tablet, Take 1 tablet (25 mg total) by mouth at bedtime., Disp: 30 tablet, Rfl: 2  Allergies  Allergen Reactions  . Adhesive [Tape] Other (See Comments)    Blisters/rash  PLEASE USE  PAPER TAPE ONLY  . Nickel     PT CAN WEAR GOLD, STERLING SILVER WITH NO PROBLEM-PT HAS NEVER HAD ANY PROBLEMS IN THE OR     ROS   Constitutional: Negative for fever or weight change.  Respiratory: Negative for cough and shortness of breath.   Cardiovascular: Negative for chest pain or palpitations.  Gastrointestinal: Negative for abdominal pain, no bowel changes.  Musculoskeletal: Negative for gait problem or joint swelling.  Skin: Negative for rash.  Neurological: Negative for dizziness or headache.  No other specific complaints in a complete review of systems (except as listed in HPI above).   Objective  Vitals:   11/09/17 1400  BP: 130/80  Pulse: (!) 106  Resp: 18  Temp: 98.2 F (36.8 C)  TempSrc: Oral  SpO2: 98%  Weight: 186 lb (84.4 kg)  Height: 5' 2.75" (1.594 m)    Body mass index is 33.21 kg/m.  Physical Exam  Constitutional: Patient appears well-developed and obese . No distress.  HENT: Head: Normocephalic and atraumatic. Ears: B TMs ok, no erythema or effusion; Nose: Nose normal. Mouth/Throat: Oropharynx is clear and moist. No oropharyngeal exudate.  Eyes: Conjunctivae and EOM are normal. Pupils are equal, round, and reactive to light. No scleral icterus.  Neck: Normal range of motion. Neck supple. No JVD present. No thyromegaly present.  Cardiovascular: Normal rate, regular rhythm and normal heart sounds.  No murmur heard. No BLE edema. Pulmonary/Chest: Effort normal and breath sounds normal. No respiratory distress. Abdominal: Soft. Bowel sounds are normal, no distension. There is no tenderness. no masses Breast: scars from previous surgeries. Thick skin of right breast from radiation sequelae.  FEMALE GENITALIA:  External genitalia normal External urethra normal RECTAL: not done Musculoskeletal: Normal range of motion, no joint effusions. No gross deformities Neurological: he is alert and oriented to person, place, and time. No cranial nerve deficit.  Coordination, balance, strength, speech and gait are normal.  Skin: Skin is warm and dry. No rash  noted. No erythema.  Psychiatric: Patient has a normal mood and affect. behavior is normal. Judgment and thought content normal.     PHQ2/9: Depression screen [facility] 2/9 11/09/2017 11/09/2017 09/20/2017 11/02/2016 04/28/2016  Decreased Interest 3 3 0 0 0  Down, Depressed, Hopeless 3 2 0 1 0  PHQ - 2 Score 6 5 0 1 0  Altered sleeping 3 - - - -  Tired, decreased energy 3 - - - -  Change in appetite 3 - - - -  Feeling bad or failure about yourself  3 - - - -  Trouble concentrating 3 - - - -  Moving slowly or fidgety/restless 0 - - - -  Suicidal thoughts 0 - - - -  PHQ-9 Score 21 - - - -  Difficult doing work/chores Very difficult - - - -    Fall Risk: Fall Risk  11/09/2017 09/20/2017 11/02/2016 04/28/2016 08/22/2015  Falls in the past year? _0      Functional Status Survey: Is the patient deaf or have difficulty hearing?: No Does the patient have difficulty seeing, even when wearing glasses/contacts?: No Does the patient have difficulty concentrating, remembering, or making decisions?: No Does the patient have difficulty walking or climbing stairs?: No Does the patient have difficulty dressing or bathing?: No Does the patient have difficulty doing errands alone such as visiting a doctor's office or shopping?: No   Assessment & Plan  1. Well woman exam  Discussed importance of 150 minutes of physical activity weekly, eat two servings of fish weekly, eat one serving of tree nuts ( cashews, pistachios, pecans, almonds.Marland Kitchen) every other day, eat 6 servings of fruit/vegetables daily and drink plenty of water and avoid sweet beverages.   2. Persistent insomnia  - QUEtiapine (SEROQUEL) 25 MG tablet; Take 1 tablet (25 mg total) by mouth at bedtime.  Dispense: 30 tablet; Refill: 2  3. Needs flu shot  refused  4. Severe episode of recurrent major depressive disorder, without  psychotic features ([place])  She feels overwhelmed raising her step-child.   - DULoxetine (CYMBALTA) 30 MG capsule; Take 1-2 capsules (30-60 mg total) by mouth daily. Start at 30 mg in am and increase to two caps daily after 7 days  Dispense: 60 capsule; Refill: 0  5. Long-term use of high-risk medication  - DG Bone Density; Future  6. Ovarian failure  - DG Bone Density; Future

## 2018-01-12 ENCOUNTER — Telehealth: Payer: Self-pay | Admitting: *Deleted

## 2018-01-12 NOTE — Telephone Encounter (Signed)
If patient can not find her medication, I did speak to [facility]. He approved a 30 days supply under [place] only if needed.  Regarding pt's Ambien request for RF. Pt should contact her pcp Dr. Ancil Boozer, who previously d/c the script.

## 2018-01-12 NOTE — Telephone Encounter (Signed)
-----   Message from Secundino Ginger sent at 01/12/2018  8:53 AM EST ----- Regarding: lost medication Contact: [phone] Pt just got anasprozole filled and cannot find them. She wanted to ask if you have any samples bc the pharmacy will charge a big amount if she gets them refilled again this soon.

## 2018-01-12 NOTE — Telephone Encounter (Signed)
I spoke with Alyson in pharmacy. [facility] does not have any samples but she can reach out to the other sites to see if they do.  I can also speak to [facility] to see if the [facility] could cover a temp. supply of med.

## 2018-01-12 NOTE — Telephone Encounter (Signed)
Oral Chemotherapy Pharmacist Encounter   Called and spoke with patient. She states that she no longer needs assistance, she is going to buy 5 pills from the pharmacy and continue to look for her medication. She believe she can find where she placed it.  Told her to reach out if she needed any assistance.  Darl Pikes, PharmD, BCPS Hematology/Oncology Clinical Pharmacist [facility] [phone]  01/12/2018 1:48 PM

## 2018-02-02 ENCOUNTER — Encounter: Payer: Self-pay | Admitting: *Deleted

## 2018-02-02 DIAGNOSIS — Z170 Estrogen receptor positive status [ER+]: Principal | ICD-10-CM

## 2018-02-02 DIAGNOSIS — C50511 Malignant neoplasm of lower-outer quadrant of right female breast: Secondary | ICD-10-CM

## 2018-02-02 NOTE — Progress Notes (Signed)
Ms. Zuleta presented to the clinic this afternoon to pick up her next 2 bottles of ASA or placebo for the [facility] [id number] study.  Her next scheduled appointment is for 02/08/18 but her medication is scheduled to run out on 02/05/18.  Her next appointment will be her 6 month visit.  She was accompanied by her granddaughter today.  She was dispensed 180 tablets in 2, 90 count bottles with the prescription number of [id number].  Patient voiced no complaints and her AE's will be assessed on her 02/08/18 visit. Raynelle Dick, RN BSN "02/02/2018 4:54 PM"

## 2018-02-03 ENCOUNTER — Other Ambulatory Visit: Payer: Self-pay | Admitting: Family Medicine

## 2018-02-03 DIAGNOSIS — K219 Gastro-esophageal reflux disease without esophagitis: Secondary | ICD-10-CM

## 2018-02-03 NOTE — Telephone Encounter (Signed)
Refill request for general medication. Omeprazole to [facility].   Last office visit 11/09/2017  No Follow-up on file.

## 2018-02-07 ENCOUNTER — Other Ambulatory Visit: Payer: Self-pay | Admitting: *Deleted

## 2018-02-07 ENCOUNTER — Other Ambulatory Visit: Payer: 59

## 2018-02-07 ENCOUNTER — Ambulatory Visit: Payer: 59 | Admitting: Internal Medicine

## 2018-02-08 ENCOUNTER — Ambulatory Visit
Admission: RE | Admit: 2018-02-08 | Discharge: 2018-02-08 | Disposition: A | Discharge status: Home / Self Care | Payer: Managed Care, Other (non HMO) | Source: Ambulatory Visit | Attending: Radiation Oncology | Admitting: Radiation Oncology

## 2018-02-08 ENCOUNTER — Inpatient Hospital Stay (HOSPITAL_BASED_OUTPATIENT_CLINIC_OR_DEPARTMENT_OTHER): Payer: Managed Care, Other (non HMO) | Admitting: Internal Medicine

## 2018-02-08 ENCOUNTER — Encounter: Payer: Self-pay | Admitting: Internal Medicine

## 2018-02-08 ENCOUNTER — Encounter: Payer: Self-pay | Admitting: *Deleted

## 2018-02-08 ENCOUNTER — Inpatient Hospital Stay: Payer: Managed Care, Other (non HMO) | Attending: Internal Medicine

## 2018-02-08 VITALS — BP 120/76 | HR 84 | Temp 97.4°F | Resp 16 | Wt 185.2 lb

## 2018-02-08 DIAGNOSIS — Z923 Personal history of irradiation: Secondary | ICD-10-CM | POA: Diagnosis not present

## 2018-02-08 DIAGNOSIS — Z006 Encounter for examination for normal comparison and control in clinical research program: Secondary | ICD-10-CM

## 2018-02-08 DIAGNOSIS — N951 Menopausal and female climacteric states: Secondary | ICD-10-CM | POA: Insufficient documentation

## 2018-02-08 DIAGNOSIS — F419 Anxiety disorder, unspecified: Secondary | ICD-10-CM

## 2018-02-08 DIAGNOSIS — M2550 Pain in unspecified joint: Secondary | ICD-10-CM | POA: Insufficient documentation

## 2018-02-08 DIAGNOSIS — Z90722 Acquired absence of ovaries, bilateral: Secondary | ICD-10-CM | POA: Insufficient documentation

## 2018-02-08 DIAGNOSIS — Z170 Estrogen receptor positive status [ER+]: Secondary | ICD-10-CM | POA: Insufficient documentation

## 2018-02-08 DIAGNOSIS — G4700 Insomnia, unspecified: Secondary | ICD-10-CM | POA: Insufficient documentation

## 2018-02-08 DIAGNOSIS — C50511 Malignant neoplasm of lower-outer quadrant of right female breast: Secondary | ICD-10-CM

## 2018-02-08 DIAGNOSIS — M549 Dorsalgia, unspecified: Secondary | ICD-10-CM

## 2018-02-08 DIAGNOSIS — Z79811 Long term (current) use of aromatase inhibitors: Secondary | ICD-10-CM | POA: Insufficient documentation

## 2018-02-08 LAB — COMPREHENSIVE METABOLIC PANEL
ALT: 34 U/L (ref 14–54)
AST: 28 U/L (ref 15–41)
Albumin: 3.8 g/dL (ref 3.5–5.0)
Alkaline Phosphatase: 94 U/L (ref 38–126)
Anion gap: 8 (ref 5–15)
BILIRUBIN TOTAL: 0.7 mg/dL (ref 0.3–1.2)
BUN: 12 mg/dL (ref 6–20)
CHLORIDE: 104 mmol/L (ref 101–111)
CO2: 29 mmol/L (ref 22–32)
Calcium: 9.6 mg/dL (ref 8.9–10.3)
Creatinine, Ser: 0.73 mg/dL (ref 0.44–1.00)
Glucose, Bld: 102 mg/dL — ABNORMAL HIGH (ref 65–99)
POTASSIUM: 4.3 mmol/L (ref 3.5–5.1)
Sodium: 141 mmol/L (ref 135–145)
TOTAL PROTEIN: 6.8 g/dL (ref 6.5–8.1)

## 2018-02-08 MED ORDER — ANASTROZOLE 1 MG PO TABS: 1.0000 mg | ORAL_TABLET | Freq: Every day | ORAL | 3 refills | Status: DC

## 2018-02-08 NOTE — Progress Notes (Signed)
Radiation Oncology Follow up Note  Name: Shelby Barry   Date:   02/08/2018 MRN:  [id number] DOB: 09/27/1966    This 52 y.o. female presents to the clinic today for six-month follow-up status post whole breast radiation for stage II ER/PR positive invasive mammary carcinoma the right breast.  REFERRING PROVIDER: Steele Sizer, MD  HPI: patient is a 52 year old female now out 6 months having completed whole breast radiation to her right breast for stage II ER/PR positive invasive mammary carcinoma. We treated her right breast and peripheral lymphatics. She is seen today in routine follow-up and is doing well. She specifically denies breast tenderness cough or bone pain.she had a mammogram back in December which I have reviewed was BI-RADS 2 benign.she is currently on arimadex tolerating that well without side effect.  COMPLICATIONS OF TREATMENT: none  FOLLOW UP COMPLIANCE: keeps appointments   PHYSICAL EXAM:  There were no vitals taken for this visit. Lungs are clear to A&P cardiac examination essentially unremarkable with regular rate and rhythm. No dominant mass or nodularity is noted in either breast in 2 positions examined. Incision is well-healed. No axillary or supraclavicular adenopathy is appreciated. Cosmetic result is excellent.no evidence of lymphedema of her upper extremities is noted. Well-developed well-nourished patient in NAD. HEENT reveals PERLA, EOMI, discs not visualized.  Oral cavity is clear. No oral mucosal lesions are identified. Neck is clear without evidence of cervical or supraclavicular adenopathy. Lungs are clear to A&P. Cardiac examination is essentially unremarkable with regular rate and rhythm without murmur rub or thrill. Abdomen is benign with no organomegaly or masses noted. Motor sensory and DTR levels are equal and symmetric in the upper and lower extremities. Cranial nerves II through XII are grossly intact. Proprioception is intact. No peripheral adenopathy  or edema is identified. No motor or sensory levels are noted. Crude visual fields are within normal range.  RADIOLOGY RESULTS: mammograms are reviewed and compatible with the above-stated findings  PLAN: present time she is doing well with no evidence of disease. I'm please were overall progress. I've asked to see her back in 6 months and then will start once your follow-up visits. She continues on aromatase without side effect. Patient is to call with any concerns.  I would like to take this opportunity to thank you for allowing me to participate in the care of your patient.Noreene Filbert, MD

## 2018-02-08 NOTE — Progress Notes (Signed)
02/08/18  6 month visit for [facility] [id number] Shelby Barry presented to the clinic today for her 6 month visit for ABC [id number].  The patient had to be reconsented for the study due to new information/results from other ASA studies.  She was given the letter of changes/findings.  The changes were discussed with the patient and she was given an opportunity to ask questions.  The patient wants to continue on the study and she signed the ICF (Version dated 12/08/17).  She was given copies of the letter with changes and the new signed consent form.  Her medications and new drug diaries were dispensed on 02/02/18 due to the fact that the patient was running out of study drug on 02/05/18.  She was dispensed 180 tablets of ASA/Placebo in two, 90 counts bottles. Today she returned her completed previous 6 months drug diaries, but stated that she threw away the old bottles.  She stated she had no pills left from the old bottles. Based on the calendars returned and the 17 days in March that the patient had completed, the total count of pills taken was 180 and she was originally dispensed 180.  Thus pill counts are correct.  The new count will begin on 02/06/18.  Her prescription number for both bottles was [id number].  She denied having any of the solicited AE's and this study form was completed.  She was assessed in the clinic by Dr. Rogue Bussing today and denied having any problems.  Her last mammogram was done in 12/18 and showed no progressive disease.  Patient states she has continued to take her AI.  She began anastrozole November of 2018.  Patient could not tolerate the side effects of Tamoxifen.  [name] also denied using any other anti-inflammatory drugs over the past 6 months.  She was instructed to call if she had any questions/concerns. Raynelle Dick, RN BSN "02/09/2018 8:13 AM"

## 2018-02-08 NOTE — Progress Notes (Signed)
While he was in [place]  Patient Care Team: Steele Sizer, MD as PCP - General (Family Medicine) Christene Lye, MD (General Surgery) Steele Sizer, MD as Attending Physician (Family Medicine)  CHIEF COMPLAINTS/PURPOSE OF CONSULTATION:  Breast cancer  #  Oncology History   # DEC 2017- RIGHT BREAST Stage II [pT1cpN1sn; screening] ER/PR- Pos- 90%; her 2 NEU- NEG s/p Lumpect & SLNBx; Dr.Sankar ]; JAN 2018- ddAC -T x12. AUG 2nd [finished RT].  # AUG 21st 2018- Start TAM; Nov 2018- Stopped sec intol/mood swings; arthralgias];  DEC 2019- Arimidex 1 mg/day   ---------------------------------------------------------  # MRI liver- hemagiomas  # Genetic counseling- DICER-1 VUS**  # TAH & BSO [Dr.Hall; [facility]; 2014]; smoking     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, estrogen receptor positive ([place])     HISTORY OF PRESENTING ILLNESS:  Shelby Barry 52 y.o.  female above history of ER/PR positive HER-2/neu negative stage II breast cancer-is here for follow-up.  Patient is currently on Arimidex; she has mild back pain joint pains which is not out of the ordinary.  She has mild hot flashes.  Denies any significant fatigue. She feels much improved coming off tamoxifen.  Denies any lumps or bumps.  Her mammogram December 2018 normal.  ROS: A complete 10 point review of system is done which is negative except mentioned above in history of present illness  MEDICAL HISTORY:  Past Medical History:  Diagnosis Date  . Anemia   . Anxiety   . Breast cancer ([place]) 09/2016  . Bursitis of right shoulder   . Cancer [facility])    breast  . 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, estrogen receptor positive ([place])   . Cataract, bilateral   . Chemotherapy induced nausea and vomiting   . Cold    states she has had it for 2 months  . Depression   . GERD (gastroesophageal reflux disease)    uses baking soda for  symptoms  . Insomnia   . Lumbago   . Personal history of chemotherapy 11/22/2016  . Personal history of radiation therapy 01/20/2017  . Vitamin D deficiency     SURGICAL HISTORY: Past Surgical History:  Procedure Laterality Date  . ABDOMINAL HYSTERECTOMY  04/20/2014  . BILATERAL SALPINGECTOMY    . BREAST BIOPSY Right 09/27/2016   positive  . BREAST LUMPECTOMY Right 10/12/2016   positive  . BREAST LUMPECTOMY WITH SENTINEL LYMPH NODE BIOPSY Right 10/12/2016   Procedure: BREAST LUMPECTOMY WITH SENTINEL LYMPH NODE BX;  Surgeon: Christene Lye, MD;  Location: [facility];  Service: General;  Laterality: Right;  . COLONOSCOPY WITH PROPOFOL N/A 05/20/2016   Procedure: COLONOSCOPY WITH PROPOFOL;  Surgeon: Lucilla Lame, MD;  Location: [place];  Service: Endoscopy;  Laterality: N/A;  . ECTOPIC PREGNANCY SURGERY    . ENDOMETRIAL ABLATION    . PORTACATH PLACEMENT Left 11/04/2016   Procedure: INSERTION PORT-A-CATH;  Surgeon: Christene Lye, MD;  Location: [facility];  Service: General;  Laterality: Left;  Left subclavian vein  . TUBAL LIGATION      SOCIAL HISTORY: [employer]; in [place]; 1 pack/day; occassional alochol.  2 grown- children- 6 boy & 19 daughter Social History   Socioeconomic History  . Marital status: Significant Other    Spouse name: John  . Number of children: 2  . Years of education: Not on file  . Highest education level: Associate degree: academic program  Social Needs  .  Financial resource strain: Not hard at all  . Food insecurity - worry: Never true  . Food insecurity - inability: Never true  . Transportation needs - medical: No  . Transportation needs - non-medical: No  Occupational History  . Occupation: specimen process specialist    Employer: [employer]  Tobacco Use  . Smoking status: Former Smoker    Packs/day: 0.50    Years: 20.00    Pack years: 10.00    Types: Cigarettes    Last attempt to quit: 08/23/2016    Years since quitting:  1.4  . Smokeless tobacco: Never Used  Substance and Sexual Activity  . Alcohol use: Yes    Alcohol/week: 0.0 oz    Comment: occassional  . Drug use: No  . Sexual activity: Yes    Partners: Male  Other Topics Concern  . Not on file  Social History Narrative   She is living with  [name] ( boyfriend ) for the past two years, and his 54 yo daughter just moved in with them Fall 2017. Her mother is doing drugs.    Works as [employer]    FAMILY HISTORY: 2 sisters- one sister breast ca at ? 43y; other sister- ? Cancer; aunt/mom's sister- stomach cancer [60s].  Family History  Problem Relation Age of Onset  . Depression Father   . Obesity Father   . Alcohol abuse Brother   . Seizures Brother   . Breast cancer Sister 4    ALLERGIES:  is allergic to adhesive [tape] and nickel.  MEDICATIONS:  Current Outpatient Medications  Medication Sig Dispense Refill  . acetaminophen (TYLENOL) 325 MG tablet Take 325 mg by mouth every 6 (six) hours as needed for fever.    Marland Kitchen anastrozole (ARIMIDEX) 1 MG tablet Take 1 tablet (1 mg total) by mouth daily. 90 tablet 3  . Bismuth Subsalicylate (PEPTO-BISMOL PO) Take 1 tablet by mouth daily as needed (indigestion).    . Investigational aspirin/placebo 300 MG tablet Alliance [id number] Take 1 tablet by mouth daily. Take with food or a full glass of water.  Do not crush enteric coated tablets. 180 tablet 0  . Multiple Vitamin (MULTIVITAMIN WITH MINERALS) TABS tablet Take 1 tablet by mouth daily. One-A-Day    . omeprazole (PRILOSEC) 20 MG capsule TAKE 1 CAPSULE BY MOUTH  DAILY 90 capsule 0  . DULoxetine (CYMBALTA) 30 MG capsule Take 1-2 capsules (30-60 mg total) by mouth daily. Start at 30 mg in am and increase to two caps daily after 7 days (Patient not taking: Reported on 02/08/2018) 60 capsule 0  . QUEtiapine (SEROQUEL) 25 MG tablet Take 1 tablet (25 mg total) by mouth at bedtime. (Patient not taking: Reported on 02/08/2018) 30 tablet 2   No current  facility-administered medications for this visit.       Marland Kitchen  PHYSICAL EXAMINATION: ECOG PERFORMANCE STATUS: 0 - Asymptomatic  Vitals:   02/08/18 0914  BP: 120/76  Pulse: 84  Resp: 16  Temp: (!) 97.4 F (36.3 C)   Filed Weights   02/08/18 0914  Weight: 185 lb 3.2 oz (84 kg)    GENERAL: Well-nourished well-developed; Alert, no distress and comfortable. Alone.   EYES: no pallor or icterus OROPHARYNX: no thrush or ulceration; good dentition  NECK: supple, no masses felt LYMPH:  no palpable lymphadenopathy in the cervical, axillary or inguinal regions LUNGS: clear to auscultation and  No wheeze or crackles HEART/CVS: regular rate & rhythm and no murmurs; No lower extremity edema ABDOMEN: abdomen soft,  non-tender and normal bowel sounds Musculoskeletal:no cyanosis of digits and no clubbing  PSYCH: alert & oriented x 3 with fluent speech NEURO: no focal motor/sensory deficits SKIN:  no rashes or significant lesions  LABORATORY DATA:  I have reviewed the data as listed Lab Results  Component Value Date   WBC 4.9 05/31/2017   HGB 13.3 05/31/2017   HCT 39.7 05/31/2017   MCV 85.9 05/31/2017   PLT 233 05/31/2017   Recent Labs    04/05/17 1318 04/12/17 1502 05/31/17 1133 02/08/18 0838  NA 135 135 139 141  K 4.3 4.8 3.9 4.3  CL 100* 99* 106 104  CO2 _0 GLUCOSE 99 105* 118* 102*  BUN _1 CREATININE 0.68 0.68 0.62 0.73  CALCIUM 9.3 9.3 9.0 9.6  GFRNONAA >60 >60 >60 >60  GFRAA >60 >60 >60 >60  PROT 7.3  --  6.3* 6.8  ALBUMIN 4.0  --  3.7 3.8  AST 31  --  24 28  ALT 45  --  25 34  ALKPHOS 99  --  88 94  BILITOT 0.3  --  0.4 0.7    RADIOGRAPHIC STUDIES: I have personally reviewed the radiological images as listed and agreed with the findings in the report. No results found.  ASSESSMENT & PLAN:   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, estrogen receptor positive ([place]) # Breast cancer-stage II ER/PR positive HER-2/neu  negative. PT1cpN1sn.  Currently on adjuvant Arimidex [history of bilateral BSO]; tolerating much better-except for mild body aches/hot flashes  # Continue Arimidex.;  Uterus scription given.  # Patient currently on ABC trial and also clinical trial involving lifestyle changes.  Met with clinical trials coordinator.   # Anxiety/insomnia- improved; on trazadone.   # Joint pain/back pain-on glucosaine/fish oil.   #Hot flashes grade 1; monitor for now.   # follow up in 3 months/ no labs.     Cammie Sickle, MD 02/08/2018 10:49 AM

## 2018-02-08 NOTE — Assessment & Plan Note (Addendum)
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 PT1cpN1sn.  Currently on adjuvant Arimidex [history of bilateral BSO]; tolerating much better-except for mild body aches/hot flashes  # Continue Arimidex.;  Uterus scription given.  # Patient currently on ABC trial and also clinical trial involving lifestyle changes.  Met with clinical trials coordinator.   # Anxiety/insomnia- improved; on trazadone.   # Joint pain/back pain-on glucosaine/fish oil.   #Hot flashes grade 1; monitor for now.   # follow up in 3 months/ no labs.

## 2018-02-09 ENCOUNTER — Encounter: Payer: Self-pay | Admitting: *Deleted

## 2018-02-09 DIAGNOSIS — Z170 Estrogen receptor positive status [ER+]: Principal | ICD-10-CM

## 2018-02-09 DIAGNOSIS — C50511 Malignant neoplasm of lower-outer quadrant of right female breast: Secondary | ICD-10-CM

## 2018-02-09 NOTE — Progress Notes (Signed)
02/08/18 BWEL 6 month visit. Ms. Gehlhausen presented to the clinic on 02/08/18 for her 6 month BWEL [id number] study visit. Dr. Rogue Bussing examined the patient, she had no complaints or new problems/AE's.  She continues to take her Arimidex with minor joint pain and some hot flashes.  All the solicited AE's for BWEL were negative for this visit.  Ms. Hinde had a fasting glucose result of 102 and she did state that she had had nothing to eat or drink for 15 hours prior to her test.  Ms. Buenaventura had weight, hip and waist measurements performed twice each.  Her average weight was 84.9 Kg, average hip circumference was 117.5 cm and average waist circumference was 98.5 cm. Ms. Jurek also completed her Follow Up Questionnaire (Version 01/23/18, Update #05).  She is scheduled to see Dr. Rogue Bussing in 3 months and she will have her 12 month research visit in 6 months to be scheduled after her next visit.  Her last bilateral breast tomogram was performed on 11/04/17 and showed no disease progression.  Ms. Fullen does not have a computer at home, so she gets her study materials via mail and her coach contacts her via her mobile phone.  She had no questions or concerns about the study for this visit but was encouraged to call if any problems should arise. Raynelle Dick, RN BSN "02/09/2018 10:22 AM"

## 2018-03-24 ENCOUNTER — Encounter: Payer: Self-pay | Admitting: *Deleted

## 2018-04-13 ENCOUNTER — Encounter: Payer: Self-pay | Admitting: *Deleted

## 2018-04-13 ENCOUNTER — Telehealth: Payer: Self-pay | Admitting: *Deleted

## 2018-04-13 NOTE — Telephone Encounter (Addendum)
Patient came by cancer center to request for a letter to be written. She was pulled by a police officer for not wearing her seat belt. She initially told the staff she wanted a letter "written to excuse myself from this ticket as I had a port. I personally spoke with Dr. Rogue Bussing about this and he said that he would write a letter." pt was informed that MD would not excuse her for not wearing her seatbelt. He did not have this discussion with the patient, nor did he promise her that he would write a letter and have this ready for her to pick up. When discussed with patient, she then replied that she "would like a letter to state that her port a cath was removed. I would like to have this picked up later and you can call me when it's ready." I asked the patient if she spoke with any doctor period about this letter. Pt replied "Well, I actually didn't speak to any doctor or Dr. B about this, but I called and left a voice msg about it." I spoke with Dr. Jacinto Reap and md is wiling to write a letter to state that her port was removed by the surgeon. I reiterated that to the patient. Pt informed and she will be contacted when letter is written.  On clinical note. Patient had her port a cath removed on 10/10/2017.

## 2018-04-13 NOTE — Telephone Encounter (Signed)
Patient called asking where she can pick up her letter. Please return call [phone]

## 2018-04-13 NOTE — Telephone Encounter (Signed)
Contacted patient - pt informed letter written. md would only write that she had the port removed. She stated that she appreciated this.

## 2018-05-10 ENCOUNTER — Ambulatory Visit: Payer: Managed Care, Other (non HMO)

## 2018-05-10 ENCOUNTER — Inpatient Hospital Stay: Payer: Managed Care, Other (non HMO) | Admitting: Oncology

## 2018-05-10 ENCOUNTER — Ambulatory Visit: Payer: Managed Care, Other (non HMO) | Admitting: Oncology

## 2018-05-16 ENCOUNTER — Other Ambulatory Visit: Payer: Self-pay | Admitting: *Deleted

## 2018-05-16 MED ORDER — ANASTROZOLE 1 MG PO TABS: 1.0000 mg | ORAL_TABLET | Freq: Every day | ORAL | 0 refills | Status: DC

## 2018-05-18 ENCOUNTER — Encounter: Payer: Self-pay | Admitting: Family Medicine

## 2018-05-18 ENCOUNTER — Ambulatory Visit: Payer: 59 | Admitting: Physician Assistant

## 2018-05-18 ENCOUNTER — Ambulatory Visit: Payer: Managed Care, Other (non HMO) | Admitting: Family Medicine

## 2018-05-18 VITALS — BP 122/80 | HR 94 | Temp 97.9°F | Resp 14 | Ht 61.5 in | Wt 187.8 lb

## 2018-05-18 DIAGNOSIS — E669 Obesity, unspecified: Secondary | ICD-10-CM

## 2018-05-18 DIAGNOSIS — Z170 Estrogen receptor positive status [ER+]: Secondary | ICD-10-CM

## 2018-05-18 DIAGNOSIS — R059 Cough, unspecified: Secondary | ICD-10-CM

## 2018-05-18 DIAGNOSIS — C50511 Malignant neoplasm of lower-outer quadrant of right female breast: Secondary | ICD-10-CM | POA: Diagnosis not present

## 2018-05-18 DIAGNOSIS — E559 Vitamin D deficiency, unspecified: Secondary | ICD-10-CM | POA: Diagnosis not present

## 2018-05-18 DIAGNOSIS — G4700 Insomnia, unspecified: Secondary | ICD-10-CM | POA: Diagnosis not present

## 2018-05-18 DIAGNOSIS — R05 Cough: Secondary | ICD-10-CM | POA: Diagnosis not present

## 2018-05-18 MED ORDER — TRAZODONE HCL 50 MG PO TABS: 25.0000 mg | ORAL_TABLET | Freq: Every evening | ORAL | 0 refills | Status: DC | PRN

## 2018-05-18 MED ORDER — BENZONATATE 100 MG PO CAPS: 100.0000 mg | ORAL_CAPSULE | Freq: Three times a day (TID) | ORAL | 0 refills | Status: DC | PRN

## 2018-05-18 MED ORDER — ALBUTEROL SULFATE HFA 108 (90 BASE) MCG/ACT IN AERS: 2.0000 | INHALATION_SPRAY | RESPIRATORY_TRACT | 1 refills | Status: DC | PRN

## 2018-05-18 NOTE — Assessment & Plan Note (Signed)
See AVS; return to see primary to discuss options, including medicine options

## 2018-05-18 NOTE — Assessment & Plan Note (Addendum)
I will not prescribe Lorrin Mais; she is willing to try trazodone; Rx given; will have her return for visit with her primary

## 2018-05-18 NOTE — Assessment & Plan Note (Signed)
Check level 

## 2018-05-18 NOTE — Assessment & Plan Note (Signed)
Undergoing treatment with arimedex

## 2018-05-18 NOTE — Patient Instructions (Addendum)
Use the medicines for symptoms if needed Go across the street for the chest xray  Check out the information at familydoctor.org entitled "Nutrition for Weight Loss: What You Need to Know about Fad Diets" Try to lose between 1-2 pounds per week by taking in fewer calories and burning off more calories You can succeed by limiting portions, limiting foods dense in calories and fat, becoming more active, and drinking 8 glasses of water a day (64 ounces) Don't skip meals, especially breakfast, as skipping meals may alter your metabolism Do not use over-the-counter weight loss pills or gimmicks that claim rapid weight loss A healthy BMI (or body mass index) is between 18.5 and 24.9 You can calculate your ideal BMI at the [place] website ClubMonetize.fr   Insomnia Insomnia is a sleep disorder that makes it difficult to fall asleep or to stay asleep. Insomnia can cause tiredness (fatigue), low energy, difficulty concentrating, mood swings, and poor performance at work or school. There are three different ways to classify insomnia:  Difficulty falling asleep.  Difficulty staying asleep.  Waking up too early in the morning.  Any type of insomnia can be long-term (chronic) or short-term (acute). Both are common. Short-term insomnia usually lasts for three months or less. Chronic insomnia occurs at least three times a week for longer than three months. What are the causes? Insomnia may be caused by another condition, situation, or substance, such as:  Anxiety.  Certain medicines.  Gastroesophageal reflux disease (GERD) or other gastrointestinal conditions.  Asthma or other breathing conditions.  Restless legs syndrome, sleep apnea, or other sleep disorders.  Chronic pain.  Menopause. This may include hot flashes.  Stroke.  Abuse of alcohol, tobacco, or illegal drugs.  Depression.  Caffeine.  Neurological disorders, such as Alzheimer  disease.  An overactive thyroid (hyperthyroidism).  The cause of insomnia may not be known. What increases the risk? Risk factors for insomnia include:  Gender. Women are more commonly affected than men.  Age. Insomnia is more common as you get older.  Stress. This may involve your professional or personal life.  Income. Insomnia is more common in people with lower income.  Lack of exercise.  Irregular work schedule or night shifts.  Traveling between different time zones.  What are the signs or symptoms? If you have insomnia, trouble falling asleep or trouble staying asleep is the main symptom. This may lead to other symptoms, such as:  Feeling fatigued.  Feeling nervous about going to sleep.  Not feeling rested in the morning.  Having trouble concentrating.  Feeling irritable, anxious, or depressed.  How is this treated? Treatment for insomnia depends on the cause. If your insomnia is caused by an underlying condition, treatment will focus on addressing the condition. Treatment may also include:  Medicines to help you sleep.  Counseling or therapy.  Lifestyle adjustments.  Follow these instructions at home:  Take medicines only as directed by your health care provider.  Keep regular sleeping and waking hours. Avoid naps.  Keep a sleep diary to help you and your health care provider figure out what could be causing your insomnia. Include: ? When you sleep. ? When you wake up during the night. ? How well you sleep. ? How rested you feel the next day. ? Any side effects of medicines you are taking. ? What you eat and drink.  Make your bedroom a comfortable place where it is easy to fall asleep: ? Put up shades or special blackout curtains to block light  from outside. ? Use a white noise machine to block noise. ? Keep the temperature cool.  Exercise regularly as directed by your health care provider. Avoid exercising right before bedtime.  Use relaxation  techniques to manage stress. Ask your health care provider to suggest some techniques that may work well for you. These may include: ? Breathing exercises. ? Routines to release muscle tension. ? Visualizing peaceful scenes.  Cut back on alcohol, caffeinated beverages, and cigarettes, especially close to bedtime. These can disrupt your sleep.  Do not overeat or eat spicy foods right before bedtime. This can lead to digestive discomfort that can make it hard for you to sleep.  Limit screen use before bedtime. This includes: ? Watching TV. ? Using your smartphone, tablet, and computer.  Stick to a routine. This can help you fall asleep faster. Try to do a quiet activity, brush your teeth, and go to bed at the same time each night.  Get out of bed if you are still awake after 15 minutes of trying to sleep. Keep the lights down, but try reading or doing a quiet activity. When you feel sleepy, go back to bed.  Make sure that you drive carefully. Avoid driving if you feel very sleepy.  Keep all follow-up appointments as directed by your health care provider. This is important. Contact a health care provider if:  You are tired throughout the day or have trouble in your daily routine due to sleepiness.  You continue to have sleep problems or your sleep problems get worse. Get help right away if:  You have serious thoughts about hurting yourself or someone else. This information is not intended to replace advice given to you by your health care provider. Make sure you discuss any questions you have with your health care provider. Document Released: 11/05/2000 Document Revised: 04/09/2016 Document Reviewed: 08/09/2014 Elsevier Interactive Patient Education  [employer].  Please do have the labs done that were ordered by Dr. Ancil Boozer

## 2018-05-18 NOTE — Progress Notes (Signed)
BP 122/80   Pulse 94   Temp 97.9 F (36.6 C) (Oral)   Resp 14   Ht 5' 1.5" (1.562 m)   Wt 187 lb 12.8 oz (85.2 kg)   SpO2 95%   BMI 34.91 kg/m    Subjective:    Patient ID: Shelby Barry, female    DOB: 1966/05/13, 52 y.o.   MRN: [id number]  HPI: Shelby Barry is a 52 y.o. female  Chief Complaint  Patient presents with  . URI    cough, with congestion with fatigue  . Shoulder Pain    chronic pain in right shoulder  . Hand Pain    right thumb    HPI Patient is here for an upper respiratory infection Her usual provider is not available  She has had a cough for "years" Had breast cancer in 2017; sister told her to get her lungs checked The cough has worsened in the last month; now a little productive; feels stuff in the bottom of the throat No fevers; having some sinus symptoms; before she got the cough, she had a lot of congestion in her sinuses for a month and that went away; that is gone, maybe allergies Having some ear symptoms, connected to the sinus issues; blows nose and ears pop No rash; no travel For her symptoms, she has tried honey, tea, health food store, got natural cough medicine with honey and ginger; tro try to get irritation out of her throat She has had bronchitis about 15 years ago; felt like a cinder block on her chest and that's when she quit smoking  She has a hx of breast cancer; last mammogram was 11/04/2017 Last CXR was 02/01/2017 and it showed no pneumonia or effusion, questionable bronchitis; left side port-a-cath  She has chronic issues and will return to see her primary for those She does not sleep well; Dr. Ancil Boozer wanted to switch her off Lorrin Mais and try something else; talked about her family issues; has a lot on her; doesn't sleep well; wakes up 5x during the night; "I miss my Ambien" She has tried melatonin, z-quil, lots of OTC things; not taking cymbalta or seroquel  Depression screen [facility] 2/9 05/18/2018 11/09/2017 11/09/2017 09/20/2017  11/02/2016  Decreased Interest 0 3 3 0 0  Down, Depressed, Hopeless 0 3 2 0 1  PHQ - 2 Score 0 6 5 0 1  Altered sleeping - 3 - - -  Tired, decreased energy - 3 - - -  Change in appetite - 3 - - -  Feeling bad or failure about yourself  - 3 - - -  Trouble concentrating - 3 - - -  Moving slowly or fidgety/restless - 0 - - -  Suicidal thoughts - 0 - - -  PHQ-9 Score - 21 - - -  Difficult doing work/chores - Very difficult - - -    Relevant past medical, surgical, family and social history reviewed Past Medical History:  Diagnosis Date  . Anemia   . Anxiety   . Breast cancer ([place]) 09/2016  . Bursitis of right shoulder   . Cancer [facility])    breast  . 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 Cataract, bilateral   . Chemotherapy induced nausea and vomiting   . Cold    states she has had it for 2 months  . Depression   . GERD (gastroesophageal reflux disease)    uses baking soda for  symptoms  . Insomnia   . Lumbago   . Personal history of chemotherapy 11/22/2016  . Personal history of radiation therapy 01/20/2017  . Vitamin D deficiency    Past Surgical History:  Procedure Laterality Date  . ABDOMINAL HYSTERECTOMY  04/20/2014  . BILATERAL SALPINGECTOMY    . BREAST BIOPSY Right 09/27/2016   positive  . BREAST LUMPECTOMY Right 10/12/2016   positive  . BREAST LUMPECTOMY WITH SENTINEL LYMPH NODE BIOPSY Right 10/12/2016   Procedure: BREAST LUMPECTOMY WITH SENTINEL LYMPH NODE BX;  Surgeon: Christene Lye, MD;  Location: [facility];  Service: General;  Laterality: Right;  . COLONOSCOPY WITH PROPOFOL N/A 05/20/2016   Procedure: COLONOSCOPY WITH PROPOFOL;  Surgeon: Lucilla Lame, MD;  Location: [place];  Service: Endoscopy;  Laterality: N/A;  . ECTOPIC PREGNANCY SURGERY    . ENDOMETRIAL ABLATION    . PORTACATH PLACEMENT Left 11/04/2016   Procedure: INSERTION PORT-A-CATH;  Surgeon: Christene Lye, MD;   Location: [facility];  Service: General;  Laterality: Left;  Left subclavian vein  . TUBAL LIGATION     Family History  Problem Relation Age of Onset  . Depression Father   . Obesity Father   . Alcohol abuse Brother   . Seizures Brother   . Breast cancer Sister 63   Social History   Tobacco Use  . Smoking status: Former Smoker    Packs/day: 0.50    Years: 20.00    Pack years: 10.00    Types: Cigarettes    Last attempt to quit: 08/23/2016    Years since quitting: 1.7  . Smokeless tobacco: Never Used  Substance Use Topics  . Alcohol use: Yes    Alcohol/week: 0.0 oz    Comment: occassional  . Drug use: No    Interim medical history since last visit reviewed. Allergies and medications reviewed  Review of Systems Per HPI unless specifically indicated above     Objective:    BP 122/80   Pulse 94   Temp 97.9 F (36.6 C) (Oral)   Resp 14   Ht 5' 1.5" (1.562 m)   Wt 187 lb 12.8 oz (85.2 kg)   SpO2 95%   BMI 34.91 kg/m   Wt Readings from Last 3 Encounters:  05/18/18 187 lb 12.8 oz (85.2 kg)  02/08/18 185 lb 3.2 oz (84 kg)  11/09/17 186 lb (84.4 kg)    Physical Exam  Constitutional: She appears well-developed and well-nourished.  obese  HENT:  Right Ear: Tympanic membrane and ear canal normal.  Left Ear: Tympanic membrane and ear canal normal.  Nose: No mucosal edema or rhinorrhea.  Mouth/Throat: Mucous membranes are normal. No posterior oropharyngeal edema or posterior oropharyngeal erythema.  Eyes: EOM are normal. No scleral icterus.  Cardiovascular: Normal rate and regular rhythm.  Pulmonary/Chest: Effort normal. No stridor. No tachypnea. No respiratory distress. She has no decreased breath sounds. She has wheezes (slight expiratory wheeze in all lung fields). She has no rhonchi. She has no rales.  Lymphadenopathy:    She has no cervical adenopathy.  Psychiatric: She has a normal mood and affect. Her behavior is normal.       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 Ancil Boozer.  An after-visit summary was printed and given to the patient at [facility].  Please see the patient instructions which may contain other information and recommendations beyond what is mentioned above in the assessment and plan.  Meds ordered this encounter  Medications  . albuterol (PROVENTIL HFA;VENTOLIN HFA) 108 (90 Base) MCG/ACT inhaler    Sig: Inhale 2 puffs into the lungs every 4 (four) hours as needed for wheezing or shortness of breath.    Dispense:  1 Inhaler    Refill:  1  . benzonatate (TESSALON PERLES) 100 MG capsule    Sig: Take 1 capsule (100 mg total) by mouth every 8 (eight) hours as needed for cough.    Dispense:  30 capsule    Refill:  0  . traZODone (DESYREL) 50 MG tablet    Sig: Take 0.5-1 tablets (25-50 mg total) by mouth at bedtime as needed for sleep.    Dispense:  30 tablet    Refill:  0    Orders Placed This Encounter  Procedures  . DG Chest 2 View

## 2018-05-26 ENCOUNTER — Inpatient Hospital Stay: Payer: Managed Care, Other (non HMO) | Attending: Internal Medicine | Admitting: Internal Medicine

## 2018-05-26 ENCOUNTER — Other Ambulatory Visit: Payer: Self-pay | Admitting: Family Medicine

## 2018-05-26 VITALS — BP 119/78 | HR 103 | Temp 97.8°F | Resp 16 | Wt 187.2 lb

## 2018-05-26 DIAGNOSIS — Z170 Estrogen receptor positive status [ER+]: Secondary | ICD-10-CM | POA: Diagnosis not present

## 2018-05-26 DIAGNOSIS — C50511 Malignant neoplasm of lower-outer quadrant of right female breast: Secondary | ICD-10-CM | POA: Insufficient documentation

## 2018-05-26 DIAGNOSIS — Z79811 Long term (current) use of aromatase inhibitors: Secondary | ICD-10-CM | POA: Diagnosis not present

## 2018-05-26 DIAGNOSIS — N951 Menopausal and female climacteric states: Secondary | ICD-10-CM | POA: Diagnosis not present

## 2018-05-26 DIAGNOSIS — K219 Gastro-esophageal reflux disease without esophagitis: Secondary | ICD-10-CM

## 2018-05-26 NOTE — Progress Notes (Signed)
[place] OFFICE PROGRESS NOTE  Patient Care Team: Steele Sizer, MD as PCP - General (Family Medicine) Christene Lye, MD (General Surgery) Steele Sizer, MD as Attending Physician (Family Medicine) Rico Junker, RN as Oncology Nurse Navigator Cammie Sickle, MD as Consulting Physician (Internal Medicine)  Cancer Staging No matching staging information was found for the patient.   Oncology History   # DEC 2017- RIGHT BREAST Stage II [pT1cpN1sn; screening] ER/PR- Pos- 90%; her 2 NEU- NEG s/p Lumpect & SLNBx; Dr.Sankar ]; JAN 2018- ddAC -T x12. AUG 2nd [finished RT].  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  female pleasant patient above history of stage II breast cancer ER PR positive currently on anastrozole is in for follow-up.  Patient continues to complain of anxiety depression and also mood swings.  She complains of difficulty sleeping at night.  Patient states that she has been having difficulty in getting her Ambien.  She states to be compliant with her anastrozole.  Review of Systems  Constitutional: Positive for malaise/fatigue. Negative for chills, diaphoresis, fever and weight loss.  HENT: Negative for nosebleeds and sore throat.   Eyes: Negative for double vision.  Respiratory: Negative for cough, hemoptysis, sputum production, shortness of breath and wheezing.   Cardiovascular: Negative for chest pain, palpitations, orthopnea and leg swelling.  Gastrointestinal: Negative for abdominal pain, blood in stool, constipation, diarrhea,  heartburn, melena, nausea and vomiting.  Genitourinary: Negative for dysuria, frequency and urgency.  Musculoskeletal: Positive for joint pain and myalgias. Negative for back pain.  Skin: Negative.  Negative for itching and rash.  Neurological: Negative for dizziness, tingling, focal weakness, weakness and headaches.  Endo/Heme/Allergies: Does not bruise/bleed easily.  Psychiatric/Behavioral: Positive for depression. The patient is nervous/anxious and has insomnia.       PAST MEDICAL HISTORY :  Past Medical History:  Diagnosis Date  . Anemia   . Anxiety   . Breast cancer ([place]) 09/2016  . Bursitis of right shoulder   . Cancer [facility])    breast  . 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 Cataract, bilateral   . Chemotherapy induced nausea and vomiting   . Cold    states she has had it for 2 months  . Depression   . GERD (gastroesophageal reflux disease)    uses baking soda for symptoms  . Insomnia   . Lumbago   . Personal history of chemotherapy 11/22/2016  . Personal history of radiation therapy 01/20/2017  . Vitamin D deficiency     PAST SURGICAL HISTORY :   Past Surgical History:  Procedure Laterality Date  . ABDOMINAL HYSTERECTOMY  04/20/2014  . BILATERAL SALPINGECTOMY    . BREAST BIOPSY Right 09/27/2016   positive  . BREAST LUMPECTOMY Right 10/12/2016   positive  . BREAST LUMPECTOMY WITH SENTINEL LYMPH NODE BIOPSY Right 10/12/2016   Procedure: BREAST LUMPECTOMY WITH SENTINEL LYMPH NODE BX;  Surgeon: Christene Lye, MD;  Location: [facility];  Service: General;  Laterality: Right;  . COLONOSCOPY WITH PROPOFOL N/A 05/20/2016   Procedure: COLONOSCOPY  WITH PROPOFOL;  Surgeon: Lucilla Lame, MD;  Location: [place];  Service: Endoscopy;  Laterality: N/A;  . ECTOPIC PREGNANCY SURGERY    . ENDOMETRIAL ABLATION    . PORTACATH PLACEMENT Left 11/04/2016   Procedure: INSERTION PORT-A-CATH;  Surgeon:  Christene Lye, MD;  Location: [facility];  Service: General;  Laterality: Left;  Left subclavian vein  . TUBAL LIGATION      FAMILY HISTORY :   Family History  Problem Relation Age of Onset  . Depression Father   . Obesity Father   . Alcohol abuse Brother   . Seizures Brother   . Breast cancer Sister 91    SOCIAL HISTORY:   Social History   Tobacco Use  . Smoking status: Former Smoker    Packs/day: 0.50    Years: 20.00    Pack years: 10.00    Types: Cigarettes    Last attempt to quit: 08/23/2016    Years since quitting: 1.7  . Smokeless tobacco: Never Used  Substance Use Topics  . Alcohol use: Yes    Alcohol/week: 0.0 oz    Comment: occassional  . Drug use: No    ALLERGIES:  is allergic to adhesive [tape] and nickel.  MEDICATIONS:  Current Outpatient Medications  Medication Sig Dispense Refill  . acetaminophen (TYLENOL) 325 MG tablet Take 325 mg by mouth every 6 (six) hours as needed for fever.    Marland Kitchen albuterol (PROVENTIL HFA;VENTOLIN HFA) 108 (90 Base) MCG/ACT inhaler Inhale 2 puffs into the lungs every 4 (four) hours as needed for wheezing or shortness of breath. 1 Inhaler 1  . anastrozole (ARIMIDEX) 1 MG tablet Take 1 tablet (1 mg total) by mouth daily. 30 tablet 0  . benzonatate (TESSALON PERLES) 100 MG capsule Take 1 capsule (100 mg total) by mouth every 8 (eight) hours as needed for cough. 30 capsule 0  . Bismuth Subsalicylate (PEPTO-BISMOL PO) Take 1 tablet by mouth daily as needed (indigestion).    . Investigational aspirin/placebo 300 MG tablet Alliance [id number] Take 1 tablet by mouth daily. Take with food or a full glass of water.  Do not crush enteric coated tablets. 180 tablet 0  . Multiple Vitamin (MULTIVITAMIN WITH MINERALS) TABS tablet Take 1 tablet by mouth daily. One-A-Day    . omeprazole (PRILOSEC) 20 MG capsule TAKE 1 CAPSULE BY MOUTH  DAILY 90 capsule 0  . traZODone (DESYREL) 50 MG tablet Take 0.5-1 tablets (25-50 mg total) by mouth at bedtime as  needed for sleep. 30 tablet 0   No current facility-administered medications for this visit.     PHYSICAL EXAMINATION: ECOG PERFORMANCE STATUS: 0 - Asymptomatic  BP 119/78 (BP Location: Left Arm, Patient Position: Sitting)   Pulse (!) 103   Temp 97.8 F (36.6 C) (Tympanic)   Resp 16   Wt 187 lb 3.2 oz (84.9 kg)   BMI 34.80 kg/m   Filed Weights   05/26/18 1016  Weight: 187 lb 3.2 oz (84.9 kg)    GENERAL: Well-nourished well-developed; Alert, no distress and comfortable.  1. EYES: no pallor or icterus OROPHARYNX: no thrush or ulceration; NECK: supple; no lymph nodes felt. LYMPH:  no palpable lymphadenopathy in the axillary or inguinal regions LUNGS: Decreased breath sounds auscultation bilaterally. No wheeze or crackles HEART/CVS: regular rate & rhythm and no murmurs; No lower extremity edema ABDOMEN:abdomen soft, non-tender and normal bowel sounds. No hepatomegaly or splenomegaly.  Musculoskeletal:no cyanosis of digits and no clubbing  PSYCH: alert & oriented x 3 with fluent  speech; anxious/tearful. NEURO: no focal motor/sensory deficits SKIN:  no rashes or significant lesions    LABORATORY DATA:  I have reviewed the data as listed    Component Value Date/Time   NA 141 02/08/2018 0838   NA 146 (H) 09/30/2016 1421   K 4.3 02/08/2018 0838   CL 104 02/08/2018 0838   CO2 29 02/08/2018 0838   GLUCOSE 102 (H) 02/08/2018 0838   BUN 12 02/08/2018 0838   BUN 11 09/30/2016 1421   CREATININE 0.73 02/08/2018 0838   CALCIUM 9.6 02/08/2018 0838   PROT 6.8 02/08/2018 0838   PROT 6.7 09/30/2016 1421   ALBUMIN 3.8 02/08/2018 0838   ALBUMIN 4.3 09/30/2016 1421   AST 28 02/08/2018 0838   ALT 34 02/08/2018 0838   ALKPHOS 94 02/08/2018 0838   BILITOT 0.7 02/08/2018 0838   BILITOT <0.2 09/30/2016 1421   GFRNONAA >60 02/08/2018 0838   GFRAA >60 02/08/2018 0838    No results found for: SPEP, UPEP  Lab Results  Component Value Date   WBC 4.9 05/31/2017   NEUTROABS 3.3  05/31/2017   HGB 13.3 05/31/2017   HCT 39.7 05/31/2017   MCV 85.9 05/31/2017   PLT 233 05/31/2017      Chemistry      Component Value Date/Time   NA 141 02/08/2018 0838   NA 146 (H) 09/30/2016 1421   K 4.3 02/08/2018 0838   CL 104 02/08/2018 0838   CO2 29 02/08/2018 0838   BUN 12 02/08/2018 0838   BUN 11 09/30/2016 1421   CREATININE 0.73 02/08/2018 0838      Component Value Date/Time   CALCIUM 9.6 02/08/2018 0838   ALKPHOS 94 02/08/2018 0838   AST 28 02/08/2018 0838   ALT 34 02/08/2018 0838   BILITOT 0.7 02/08/2018 0838   BILITOT <0.2 09/30/2016 1421       RADIOGRAPHIC STUDIES: I have personally reviewed the radiological images as listed and agreed with the findings in the report. No results found.   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  #Clinically no evidence of recurrence.  Continue anastrozole.  #Continues to be in the ABC trial/also on trial involving lifestyle changes.  # Anxiety/insomnia-worsened; long discussion with the patient regarding evaluation with psychiatrist.  Patient declines.  Still receiving appointment with PCP regarding Ambien prescription.  # Joint pain/back pain-secondary to Arimidex.   #Hot flashes grade 1; monitor for now.   # follow up in 3 months/ no labs/mammogram   Orders Placed This Encounter  Procedures  . MM DIAG BREAST TOMO BILATERAL    Standing Status:   Future    Standing Expiration Date:   05/27/2019    Order Specific Question:   Reason for Exam (SYMPTOM  OR DIAGNOSIS REQUIRED)    Answer:   Breast cancer    Order Specific Question:   Is the patient pregnant?    Answer:   No    Order Specific Question:   Preferred imaging location?    Answer:   [facility]  . US Breast Limited Uni Left Inc Axilla    Standing Status:   Future     Standing Expiration Date:   07/28/2019    Order Specific Question:   Reason for Exam (SYMPTOM  OR DIAGNOSIS REQUIRED)    Answer:   breast cancer    Order Specific Question:  Preferred imaging location?    Answer:   [facility]  . US Breast Limited Uni Right Inc Ax[place]    Standing Status:   Future    Standing Expiration Date:   07/28/2019    Order Specific Question:   Reason for Exam (SYMPTOM  OR DIAGNOSIS REQUIRED)    Answer:   breast cancer    Order Specific Question:   Preferred imaging location?    Answer:   [facility]   All questions were answered. The patient knows to call the clinic with any problems, questions or concerns.      Cammie Sickle, MD 05/26/2018 11:08 PM

## 2018-05-26 NOTE — Telephone Encounter (Signed)
Refill request for general medication: Omeprazole 20 mg  Last office visit: 05/18/2018  Last physical exam: 11/09/2017  Follow-ups on file. 06/14/2018

## 2018-05-26 NOTE — Assessment & Plan Note (Addendum)
#  Breast cancer-stage II ER/PR positive HER-2/neu negative; continue Arimidex tolerating well except for myalgias.  #Clinically no evidence of recurrence.  Continue anastrozole.  #Continues to be in the ABC trial/also on trial involving lifestyle changes.  # Anxiety/insomnia-worsened; long discussion with the patient regarding evaluation with psychiatrist.  Patient declines.  Still receiving appointment with PCP regarding Ambien prescription.  # Joint pain/back pain-secondary to Arimidex.   #Hot flashes grade 1; monitor for now.   # follow up in 3 months/ no labs/mammogram

## 2018-05-27 LAB — VITAMIN D 25 HYDROXY (VIT D DEFICIENCY, FRACTURES): Vit D, 25-Hydroxy: 28 ng/mL — ABNORMAL LOW (ref 30.0–100.0)

## 2018-05-27 LAB — COMPREHENSIVE METABOLIC PANEL
ALT: 30 IU/L (ref 0–32)
AST: 21 IU/L (ref 0–40)
Albumin/Globulin Ratio: 2.3 — ABNORMAL HIGH (ref 1.2–2.2)
Albumin: 4.4 g/dL (ref 3.5–5.5)
Alkaline Phosphatase: 112 IU/L (ref 39–117)
BUN/Creatinine Ratio: 12 (ref 9–23)
BUN: 9 mg/dL (ref 6–24)
Bilirubin Total: 0.2 mg/dL (ref 0.0–1.2)
CALCIUM: 9.7 mg/dL (ref 8.7–10.2)
CO2: 23 mmol/L (ref 20–29)
Chloride: 105 mmol/L (ref 96–106)
Creatinine, Ser: 0.73 mg/dL (ref 0.57–1.00)
GFR, EST AFRICAN AMERICAN: 110 mL/min/{1.73_m2} (ref >59)
GFR, EST NON AFRICAN AMERICAN: 95 mL/min/{1.73_m2} (ref >59)
Globulin, Total: 1.9 g/dL (ref 1.5–4.5)
Glucose: 98 mg/dL (ref 65–99)
POTASSIUM: 4.6 mmol/L (ref 3.5–5.2)
Sodium: 144 mmol/L (ref 134–144)
Total Protein: 6.3 g/dL (ref 6.0–8.5)

## 2018-05-27 LAB — LIPID PANEL
Chol/HDL Ratio: 3.3 ratio (ref 0.0–4.4)
Cholesterol, Total: 186 mg/dL (ref 100–199)
HDL: 56 mg/dL (ref >39)
LDL CALC: 100 mg/dL — AB (ref 0–99)
Triglycerides: 148 mg/dL (ref 0–149)
VLDL CHOLESTEROL CAL: 30 mg/dL (ref 5–40)

## 2018-05-27 LAB — HEPATITIS PANEL, ACUTE
HEP B S AG: NEGATIVE
Hep A IgM: NEGATIVE
Hep B C IgM: NEGATIVE
Hep C Virus Ab: <0.1 s/co ratio (ref 0.0–0.9)

## 2018-05-27 LAB — HEMOGLOBIN A1C
ESTIMATED AVERAGE GLUCOSE: 108 mg/dL
Hgb A1c MFr Bld: 5.4 % (ref 4.8–5.6)

## 2018-05-27 LAB — TSH: TSH: 2.99 u[IU]/mL (ref 0.450–4.500)

## 2018-05-27 LAB — RPR: RPR: NONREACTIVE

## 2018-05-27 LAB — VITAMIN B12: Vitamin B-12: 656 pg/mL (ref 232–1245)

## 2018-05-27 LAB — HIV ANTIBODY (ROUTINE TESTING W REFLEX): HIV Screen 4th Generation wRfx: NONREACTIVE

## 2018-06-14 ENCOUNTER — Ambulatory Visit: Payer: Managed Care, Other (non HMO) | Admitting: Family Medicine

## 2018-06-14 ENCOUNTER — Encounter: Payer: Self-pay | Admitting: Family Medicine

## 2018-06-14 VITALS — BP 122/76 | HR 89 | Temp 98.2°F | Resp 16 | Ht 62.0 in | Wt 186.7 lb

## 2018-06-14 DIAGNOSIS — C50511 Malignant neoplasm of lower-outer quadrant of right female breast: Secondary | ICD-10-CM

## 2018-06-14 DIAGNOSIS — G4700 Insomnia, unspecified: Secondary | ICD-10-CM

## 2018-06-14 DIAGNOSIS — R5383 Other fatigue: Secondary | ICD-10-CM

## 2018-06-14 DIAGNOSIS — M79644 Pain in right finger(s): Secondary | ICD-10-CM

## 2018-06-14 DIAGNOSIS — Z170 Estrogen receptor positive status [ER+]: Secondary | ICD-10-CM

## 2018-06-14 DIAGNOSIS — F339 Major depressive disorder, recurrent, unspecified: Secondary | ICD-10-CM | POA: Diagnosis not present

## 2018-06-14 MED ORDER — ZOLPIDEM TARTRATE ER 6.25 MG PO TBCR: 6.2500 mg | EXTENDED_RELEASE_TABLET | Freq: Every evening | ORAL | 0 refills | Status: DC | PRN

## 2018-06-14 MED ORDER — MELOXICAM 15 MG PO TABS: 15.0000 mg | ORAL_TABLET | Freq: Every day | ORAL | 0 refills | Status: DC

## 2018-06-14 MED ORDER — TRAZODONE HCL 50 MG PO TABS: 25.0000 mg | ORAL_TABLET | Freq: Every evening | ORAL | 0 refills | Status: DC | PRN

## 2018-06-14 NOTE — Progress Notes (Signed)
Name: Shelby Barry   MRN: [id number]    DOB: August 19, 1966   Date:06/14/2018       Progress Note  Subjective  Chief Complaint  Chief Complaint  Patient presents with  . Follow-up    patient is here for her 3 week f/u  . Fatigue  . Review lab results  . Hand Pain    patient has been having some right thumb pain for the past 4-5 months    HPI  Major Depression: she was seen 10/2017 and given duloxetine and advised to follow up in one month. She states she tried medication and it did not help, so she stopped taking it. She also states had multiple side effects with previous medications. She tried celexa, lexapro and does not recall the other medications. She continues to feel down. PhQ9 today is 65 but it was higher Dec 2018. She denies suicidal thoughts or ideation. Able to go to work. She has no motivation to do anything.   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  Pain is described as throbbing and sometimes intense and aching. No  tingling or numbness. She states movement makes it worse, sometimes joint pops.   Other fatigue/Insomnia: she was seen by Dr. Sanda Klein one month ago and was given Trazodone, she states that it helps her sleep but feels tired for hours and has difficulty getting out of bed in am's. Advised to take medication earlier but she states she works 12 hours shifts 3 days a week. She has responded to Ambien in the past and would like a refill. Explained usually not covered by insurance and it is only for short term use. She has difficulty falling and staying asleep. Mind is busy and has difficulty resting. Labs reviewed and unremarkable. Explained fatigue likely multifactorial ( depression as a big component) but we will try to correct sleep as requested  Breast Cancer: still under the care of hematologist and is due for repeat mammogram. Order already in   Patient Active Problem List   Diagnosis Date Noted  . Obesity (BMI  30.0-34.9) 05/18/2018  . Liver lesion 11/09/2016  . Cough in adult 10/29/2016  . 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 Abnormal mammogram 09/22/2016  . Recurrent major depression-severe ([place]) 08/22/2015  . Bursitis of shoulder 08/22/2015  . Bilateral cataracts 08/22/2015  . Circadian rhythm sleep disorder, 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  . Vitamin D deficiency 08/22/2015  . LBP (low back pain) 08/22/2015  . GAD (generalized anxiety disorder) 08/22/2015  . Tobacco use 08/22/2015  . History of anemia 08/17/2007  . Insomnia 08/16/2007    Past Surgical History:  Procedure Laterality Date  . ABDOMINAL HYSTERECTOMY  04/20/2014  . BILATERAL SALPINGECTOMY    . BREAST BIOPSY Right 09/27/2016   positive  . BREAST LUMPECTOMY Right 10/12/2016   positive  . BREAST LUMPECTOMY WITH SENTINEL LYMPH NODE BIOPSY Right 10/12/2016   Procedure: BREAST LUMPECTOMY WITH SENTINEL LYMPH NODE BX;  Surgeon: Christene Lye, MD;  Location: [facility];  Service: General;  Laterality: Right;  . COLONOSCOPY WITH PROPOFOL N/A 05/20/2016   Procedure: COLONOSCOPY WITH PROPOFOL;  Surgeon: Lucilla Lame, MD;  Location: [facility];  Service: Endoscopy;  Laterality: N/A;  . ECTOPIC PREGNANCY SURGERY    . ENDOMETRIAL ABLATION    . PORTACATH PLACEMENT Left 11/04/2016   Procedure: INSERTION PORT-A-CATH;  Surgeon: Christene Lye, MD;  Location: [facility];  Service: General;  Laterality: Left;  Left subclavian vein  . TUBAL LIGATION      Family History  Problem Relation Age of Onset  . Depression Father   . Obesity Father   . Alcohol abuse Brother   . Seizures Brother   . Breast cancer Sister 47    Social History   Socioeconomic History  . Marital status: Significant Other    Spouse name: John  . Number of children: 2  . Years of education: Not on file  . Highest education level: Associate degree: academic program  Occupational  History  . Occupation: specimen process specialist    Employer: [employer]  Social Needs  . Financial resource strain: Not hard at all  . Food insecurity:    Worry: Never true    Inability: Never true  . Transportation needs:    Medical: No    Non-medical: No  Tobacco Use  . Smoking status: Former Smoker    Packs/day: 0.50    Years: 20.00    Pack years: 10.00    Types: Cigarettes    Last attempt to quit: 08/23/2016    Years since quitting: 1.8  . Smokeless tobacco: Never Used  Substance and Sexual Activity  . Alcohol use: Yes    Alcohol/week: 0.0 oz    Comment: occassional  . Drug use: No  . Sexual activity: Yes    Partners: Male  Lifestyle  . Physical activity:    Days per week: 3 days    Minutes per session: 20 min  . Stress: To some extent  Relationships  . Social connections:    Talks on phone: More than three times a week    Gets together: More than three times a week    Attends religious service: 1 to 4 times per year    Active member of club or organization: No    Attends meetings of clubs or organizations: Never    Relationship status: Living with partner  . Intimate partner violence:    Fear of current or ex partner: No    Emotionally abused: No    Physically abused: No    Forced sexual activity: No  Other Topics Concern  . Not on file  Social History Narrative   She is living with  [name] ( boyfriend ) for the past two years, and his 58 yo daughter just moved in with them Fall 2017. Her mother is doing drugs.    Works as [employer]     Current Outpatient Medications:  .  acetaminophen (TYLENOL) 325 MG tablet, Take 325 mg by mouth every 6 (six) hours as needed for fever., Disp: , Rfl:  .  albuterol (PROVENTIL HFA;VENTOLIN HFA) 108 (90 Base) MCG/ACT inhaler, Inhale 2 puffs into the lungs every 4 (four) hours as needed for wheezing or shortness of breath., Disp: 1 Inhaler, Rfl: 1 .  anastrozole (ARIMIDEX) 1 MG tablet, Take 1 tablet (1 mg total) by mouth daily.,  Disp: 30 tablet, Rfl: 0 .  Investigational aspirin/placebo 300 MG tablet Alliance [id number], Take 1 tablet by mouth daily. Take with food or a full glass of water.  Do not crush enteric coated tablets., Disp: 180 tablet, Rfl: 0 .  Multiple Vitamin (MULTIVITAMIN WITH MINERALS) TABS tablet, Take 1 tablet by mouth daily. One-A-Day, Disp: , Rfl:  .  omeprazole (PRILOSEC) 20 MG capsule, TAKE 1 CAPSULE BY MOUTH  DAILY, Disp: 90 capsule, Rfl: 0 .  traZODone (DESYREL) 50 MG tablet, Take  0.5-1 tablets (25-50 mg total) by mouth at bedtime as needed for sleep. (Patient not taking: Reported on 06/14/2018), Disp: 30 tablet, Rfl: 0  Allergies  Allergen Reactions  . Adhesive [Tape] Other (See Comments)    Blisters/rash  PLEASE USE PAPER TAPE ONLY  . Nickel     PT CAN WEAR GOLD, STERLING SILVER WITH NO PROBLEM-PT HAS NEVER HAD ANY PROBLEMS IN THE OR     ROS  Constitutional: Negative for fever or weight change.  Respiratory: Negative for cough and shortness of breath.   Cardiovascular: Negative for chest pain or palpitations.  Gastrointestinal: Negative for abdominal pain, no bowel changes.  Musculoskeletal: Negative for gait problem or joint swelling.  Skin: Negative for rash.  Neurological: Negative for dizziness or headache.  No other specific complaints in a complete review of systems (except as listed in HPI above).  Objective  Vitals:   06/14/18 1105  BP: 122/76  Pulse: 89  Resp: 16  Temp: 98.2 F (36.8 C)  TempSrc: Oral  SpO2: 96%  Weight: 186 lb 11.2 oz (84.7 kg)  Height: 5\' 2"  (1.575 m)    Body mass index is 34.15 kg/m.  Physical Exam  Constitutional: Patient appears well-developed and well-nourished. Obese  No distress.  HEENT: head atraumatic, normocephalic, pupils equal and reactive to light,  neck supple, throat within normal limits Cardiovascular: Normal rate, regular rhythm and normal heart sounds.  No murmur heard. No BLE edema. Pulmonary/Chest: Effort normal and breath  sounds normal. No respiratory distress. Abdominal: Soft.  There is no tenderness. Psychiatric: Patient has a normal mood and affect. behavior is normal. Judgment and thought content normal. Muscular Skeletal: pain during palpation of mcp joint right thumb, mild swelling.   Recent Results (from the past 2160 hour(s))  Comprehensive Metabolic Panel (CMET)     Status: Abnormal   Collection Time: 05/26/18 10:57 AM  Result Value Ref Range   Glucose 98 65 - 99 mg/dL   BUN 9 6 - 24 mg/dL   Creatinine, Ser 0.73 0.57 - 1.00 mg/dL   GFR calc non Af Amer 95 >59 mL/min/1.73   GFR calc Af Amer 110 >59 mL/min/1.73   BUN/Creatinine Ratio 12 9 - 23   Sodium 144 134 - 144 mmol/L   Potassium 4.6 3.5 - 5.2 mmol/L   Chloride 105 96 - 106 mmol/L   CO2 23 20 - 29 mmol/L   Calcium 9.7 8.7 - 10.2 mg/dL   Total Protein 6.3 6.0 - 8.5 g/dL   Albumin 4.4 3.5 - 5.5 g/dL   Globulin, Total 1.9 1.5 - 4.5 g/dL   Albumin/Globulin Ratio 2.3 (H) 1.2 - 2.2   Bilirubin Total 0.2 0.0 - 1.2 mg/dL   Alkaline Phosphatase 112 39 - 117 IU/L   AST 21 0 - 40 IU/L   ALT 30 0 - 32 IU/L  HgB A1c     Status: None   Collection Time: 05/26/18 10:57 AM  Result Value Ref Range   Hgb A1c MFr Bld 5.4 4.8 - 5.6 %    Comment:          Prediabetes: 5.7 - 6.4          Diabetes: >6.4          Glycemic control for adults with diabetes: <7.0    Est. average glucose Bld gHb Est-mCnc 108 mg/dL  HIV antibody (with reflex)     Status: None   Collection Time: 05/26/18 10:57 AM  Result Value Ref Range   HIV  Screen 4th Generation wRfx Non Reactive Non Reactive  Lipid Profile     Status: Abnormal   Collection Time: 05/26/18 10:57 AM  Result Value Ref Range   Cholesterol, Total 186 100 - 199 mg/dL   Triglycerides 148 0 - 149 mg/dL   HDL 56 >39 mg/dL   VLDL Cholesterol Cal 30 5 - 40 mg/dL   LDL Calculated 100 (H) 0 - 99 mg/dL   Chol/HDL Ratio 3.3 0.0 - 4.4 ratio    Comment:                                   T. Chol/HDL Ratio                                              Men  Women                               1/2 Avg.Risk  3.4    3.3                                   Avg.Risk  5.0    4.4                                2X Avg.Risk  9.6    7.1                                3X Avg.Risk 23.4   11.0   RPR     Status: None   Collection Time: 05/26/18 10:57 AM  Result Value Ref Range   RPR Ser Ql Non Reactive Non Reactive  TSH     Status: None   Collection Time: 05/26/18 10:57 AM  Result Value Ref Range   TSH 2.990 0.450 - 4.500 uIU/mL  B12     Status: None   Collection Time: 05/26/18 10:57 AM  Result Value Ref Range   Vitamin B-12 656 232 - 1,245 pg/mL  Vitamin D (25 hydroxy)     Status: Abnormal   Collection Time: 05/26/18 10:57 AM  Result Value Ref Range   Vit D, 25-Hydroxy 28.0 (L) 30.0 - 100.0 ng/mL    Comment: Vitamin D deficiency has been defined by the [employer] and an Endocrine Society practice guideline as a level of serum 25-OH vitamin D less than 20 ng/mL (1,2). The Endocrine Society went on to further define vitamin D insufficiency as a level between 21 and 29 ng/mL (2). 1. IOM (Institute of Medicine). 2010. Dietary reference    intakes for calcium and D. [place]: The    [employer]. 2. Holick MF, Binkley [place], Bischoff-Ferrari HA, et al.    Evaluation, treatment, and prevention of vitamin D    deficiency: an Endocrine Society clinical practice    guideline. JCEM. 2011 Jul; 96(7):1911-30.   Hepatitis panel, acute     Status: None   Collection Time: 05/26/18 10:57 AM  Result Value Ref Range   Hep A IgM Negative Negative   Hepatitis B Surface Ag Negative Negative  Hep B C IgM Negative Negative   Hep C Virus Ab <0.1 0.0 - 0.9 s/co ratio    Comment:                                   Negative:     < 0.8                              Indeterminate: 0.8 - 0.9                                   Positive:     > 0.9  The CDC recommends that a positive HCV antibody result  be  followed up with a HCV Nucleic Acid Amplification  test (836629).       PHQ2/9: Depression screen [facility] 2/9 06/14/2018 05/18/2018 11/09/2017 11/09/2017 09/20/2017  Decreased Interest 0 0 3 3 0  Down, Depressed, Hopeless 2 0 3 2 0  PHQ - 2 Score 2 0 6 5 0  Altered sleeping 1 - 3 - -  Tired, decreased energy 3 - 3 - -  Change in appetite 0 - 3 - -  Feeling bad or failure about yourself  1 - 3 - -  Trouble concentrating 2 - 3 - -  Moving slowly or fidgety/restless 1 - 0 - -  Suicidal thoughts 1 - 0 - -  PHQ-9 Score 11 - 21 - -  Difficult doing work/chores Somewhat difficult - Very difficult - -     Fall Risk: Fall Risk  06/14/2018 05/18/2018 11/09/2017 09/20/2017 11/02/2016  Falls in the past year? No No No No No     Functional Status Survey: Is the patient deaf or have difficulty hearing?: No Does the patient have difficulty seeing, even when wearing glasses/contacts?: No Does the patient have difficulty concentrating, remembering, or making decisions?: No Does the patient have difficulty walking or climbing stairs?: No Does the patient have difficulty dressing or bathing?: No Does the patient have difficulty doing errands alone such as visiting a doctor's office or shopping?: No    Assessment & Plan  1. Major depression, recurrent, chronic ([place])  She refuses taking medication, explained that treating the insomnia is like using a bandaid, or treating the cough of a patient with pneumonia without treating the infection.   2. Persistent insomnia  Explained that she can only take Ambien the days that she works long hours since trazodone is taking 12 hours to get off her system.  - traZODone (DESYREL) 50 MG tablet; Take 0.5-1 tablets (25-50 mg total) by mouth at bedtime as needed for sleep.  Dispense: 90 tablet; Refill: 0 - zolpidem (AMBIEN CR) 6.25 MG CR tablet; Take 1 tablet (6.25 mg total) by mouth at bedtime as needed for sleep.  Dispense: 30 tablet; Refill: 0  3.  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 Mammogram is up to date   4. Other fatigue  Reviewed labs, likely multifactorial    5. Pain of right thumb  - meloxicam (MOBIC) 15 MG tablet; Take 1 tablet (15 mg total) by mouth daily.  Dispense: 90 tablet; Refill: 0 Advised to take it for one week and after that prn, tylenol is safer

## 2018-08-02 ENCOUNTER — Other Ambulatory Visit: Payer: Self-pay | Admitting: Family Medicine

## 2018-08-02 DIAGNOSIS — M79644 Pain in right finger(s): Secondary | ICD-10-CM

## 2018-08-02 DIAGNOSIS — G4700 Insomnia, unspecified: Secondary | ICD-10-CM

## 2018-08-04 ENCOUNTER — Encounter: Payer: Self-pay | Admitting: *Deleted

## 2018-08-04 DIAGNOSIS — C50511 Malignant neoplasm of lower-outer quadrant of right female breast: Secondary | ICD-10-CM

## 2018-08-04 DIAGNOSIS — Z170 Estrogen receptor positive status [ER+]: Principal | ICD-10-CM

## 2018-08-04 NOTE — Progress Notes (Signed)
08/04/18: I met with Ms. Shelby Barry today to distribute her next 6 months of ASA/Placebo for the [id number] ABC trial.  Ms. Shelby Barry had missed only one dose of her medication out of the 180 tablets dispensed 6 months prior.  Ms. Shelby Barry received 180 tablets dispensed in 2, 90 count bottles today.  These tablets have an expiration date of 02/20/19.  The RX number is [id number] and her patient ID number is [id number].  She has her next appointment in the clinic with Dr. Rogue Bussing on the 4th of October at which time she will have her 12 month study visit with physical assessment and solicited AE's completed.  She reported no problems at this time.  She returned one pill bottle with 1 tablet left.  This was returned to the pharmacy.  Ms. Shelby Barry was instructed to call the clinic if she should have any questions. Raynelle Dick, RN BSN "08/04/2018 3:46 PM"

## 2018-08-11 ENCOUNTER — Ambulatory Visit: Payer: Managed Care, Other (non HMO) | Admitting: Radiation Oncology

## 2018-08-18 ENCOUNTER — Ambulatory Visit
Admission: RE | Admit: 2018-08-18 | Discharge: 2018-08-18 | Disposition: A | Discharge status: Home / Self Care | Payer: Managed Care, Other (non HMO) | Source: Ambulatory Visit | Attending: Radiation Oncology | Admitting: Radiation Oncology

## 2018-08-18 ENCOUNTER — Encounter: Payer: Self-pay | Admitting: Radiation Oncology

## 2018-08-18 ENCOUNTER — Other Ambulatory Visit: Payer: Self-pay

## 2018-08-18 VITALS — BP 125/85 | HR 92 | Temp 96.0°F | Resp 16 | Wt 192.8 lb

## 2018-08-18 DIAGNOSIS — Z170 Estrogen receptor positive status [ER+]: Secondary | ICD-10-CM | POA: Diagnosis not present

## 2018-08-18 DIAGNOSIS — C50511 Malignant neoplasm of lower-outer quadrant of right female breast: Secondary | ICD-10-CM | POA: Diagnosis present

## 2018-08-18 DIAGNOSIS — Z923 Personal history of irradiation: Secondary | ICD-10-CM | POA: Diagnosis not present

## 2018-08-18 DIAGNOSIS — K1379 Other lesions of oral mucosa: Secondary | ICD-10-CM | POA: Insufficient documentation

## 2018-08-18 NOTE — Progress Notes (Signed)
Radiation Oncology Follow up Note  Name: Shelby Barry   Date:   08/18/2018 MRN:  [id number] DOB: 1966-08-26    This 52 y.o. female presents to the clinic today for  Year follow-up status post whole breast radiation to her right breast for stage II ER/PR positive invasive mammary carcinoma.  REFERRING PROVIDER: Steele Sizer, MD  HPI: patient is a 52 year old female now seen out 1 year having completed radiation therapy to her right breast for stage II ER/PR positive invasive mammary carcinoma she received radiation to her right breast and peripheral lymphatics. Seen today in routine follow-up she is doing well. She specifically denies breast tenderness cough or bone pain. She's having no swelling in her upper extremities..her last mammogram which I have reviewed was back in December and was BI-RADS 2 benign.she is currently on arimadex having some mouth sores and she is concerned this may be the cause I've asked her to address that with medical oncology.  COMPLICATIONS OF TREATMENT: none  FOLLOW UP COMPLIANCE: keeps appointments   PHYSICAL EXAM:  BP 125/85 (BP Location: Left Arm, Patient Position: Sitting)   Pulse 92   Temp (!) 96 F (35.6 C) (Tympanic)   Resp 16   Wt 192 lb 12.7 oz (87.5 kg)   BMI 35.26 kg/m  Lungs are clear to A&P cardiac examination essentially unremarkable with regular rate and rhythm. No dominant mass or nodularity is noted in either breast in 2 positions examined. Incision is well-healed. No axillary or supraclavicular adenopathy is appreciated. Cosmetic result is excellent. Well-developed well-nourished patient in NAD. HEENT reveals PERLA, EOMI, discs not visualized.  Oral cavity is clear. No oral mucosal lesions are identified. Neck is clear without evidence of cervical or supraclavicular adenopathy. Lungs are clear to A&P. Cardiac examination is essentially unremarkable with regular rate and rhythm without murmur rub or thrill. Abdomen is benign with no  organomegaly or masses noted. Motor sensory and DTR levels are equal and symmetric in the upper and lower extremities. Cranial nerves II through XII are grossly intact. Proprioception is intact. No peripheral adenopathy or edema is identified. No motor or sensory levels are noted. Crude visual fields are within normal range.  RADIOLOGY RESULTS: mammograms reviewed and compatible above-stated findings  PLAN: present time patient is doing well with no evidence of disease. She'll address her mouth sores with medical oncology. She is oriented scheduled for follow-ups next December. I've asked to see her back in 1 year for follow-up. She knows to call with any concerns.  I would like to take this opportunity to thank you for allowing me to participate in the care of your patient.Noreene Filbert, MD

## 2018-08-25 ENCOUNTER — Encounter: Payer: Self-pay | Admitting: Internal Medicine

## 2018-08-25 ENCOUNTER — Inpatient Hospital Stay: Payer: Managed Care, Other (non HMO) | Attending: Internal Medicine | Admitting: Internal Medicine

## 2018-08-25 ENCOUNTER — Other Ambulatory Visit: Payer: Self-pay | Admitting: Family Medicine

## 2018-08-25 ENCOUNTER — Encounter: Payer: Self-pay | Admitting: *Deleted

## 2018-08-25 VITALS — BP 159/80 | HR 79 | Temp 97.8°F | Resp 16 | Wt 194.2 lb

## 2018-08-25 DIAGNOSIS — M2550 Pain in unspecified joint: Secondary | ICD-10-CM | POA: Diagnosis not present

## 2018-08-25 DIAGNOSIS — Z79811 Long term (current) use of aromatase inhibitors: Secondary | ICD-10-CM | POA: Insufficient documentation

## 2018-08-25 DIAGNOSIS — M8580 Other specified disorders of bone density and structure, unspecified site: Secondary | ICD-10-CM | POA: Insufficient documentation

## 2018-08-25 DIAGNOSIS — C50511 Malignant neoplasm of lower-outer quadrant of right female breast: Secondary | ICD-10-CM

## 2018-08-25 DIAGNOSIS — Z170 Estrogen receptor positive status [ER+]: Principal | ICD-10-CM

## 2018-08-25 DIAGNOSIS — Z006 Encounter for examination for normal comparison and control in clinical research program: Secondary | ICD-10-CM | POA: Diagnosis not present

## 2018-08-25 DIAGNOSIS — Z87891 Personal history of nicotine dependence: Secondary | ICD-10-CM | POA: Diagnosis not present

## 2018-08-25 DIAGNOSIS — K219 Gastro-esophageal reflux disease without esophagitis: Secondary | ICD-10-CM

## 2018-08-25 DIAGNOSIS — N951 Menopausal and female climacteric states: Secondary | ICD-10-CM | POA: Insufficient documentation

## 2018-08-25 DIAGNOSIS — M549 Dorsalgia, unspecified: Secondary | ICD-10-CM | POA: Insufficient documentation

## 2018-08-25 NOTE — Progress Notes (Signed)
12 month visit for [facility] [id number] Ms. Stice presented to the clinic this morning for her 12 month visit for ABC [id number].  Her medications and new drug diaries were dispensed by Raynelle Dick, RN on 08/04/18 due to the fact that the patient was running out of study drug.  She was dispensed 180 tablets of Aspirin/Placebo in two, 90 counts bottles. Her drug diaries were collected at the same time that new drug was dispensed. Based on the calendars returned and patient's report, she had only missed one dose of the Aspirin/placebo, and she states she did take one dose of regular strength aspirin - 2 tablets approximately 2 months ago, but denies taking any additional aspirin or NSAIDs during this treatment period. Reminded patient to try to avoid taking Aspirin, Ibuprofen, Aleve or other drugs in this class while she is on study. Solicited AE's reviewed with patient and she reports some bruising on her arms and legs bilaterally, which she attributes to her new dog jumping up on her all of the time. She denies any other bleeding events. She was assessed in the clinic by Dr. 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 She also reports having a trace of edema in her feet and ankles bilaterally and states she is still not sleeping well even when taking Ambien and states she believes it is just not effective for her anymore. States she throws up 5-6 times per week, usually after she eats fatty foods, but also states this is not new and has been going on for awhile.  Her last mammogram was done in 12/18 and Dr. Rogue Bussing is scheduling her annual mammogram along with a bone scan in December. Patient reports she had a hysterectomy in 2012 and fears she is losing bone density, especially since she is now taking Arimide, which she began in November of 2018. Reports she is tolerating this much better than she did the Tamoxifen. Ms. Shrestha was instructed to call if  she has any questions/concerns. AEs and Solicited AEs are as follows:   Study/Protocol: [id number] ABC Cycle: 6-12 months Event Grade Onset Date Resolved Date Drug Name Attribution Treatment Comments  Bruising *Solicited AE 1   Aspirin/placebo possible none Pt reports her new dog is responsible  Bil. Pedal edema 1    unlikely none   Insomnia 1 Medical history   unrelated Has Trazodone but not taking   Hot Flashes 1    unrelated    Neuropathy in fingers 1    unrelated none States this is from chemo  Vomiting 1    unrelated  When she eats fatty foods  Yolande Jolly, BSN, MHA, OCN 08/25/2018 11:28 AM

## 2018-08-25 NOTE — Progress Notes (Signed)
[facility] OFFICE PROGRESS NOTE  Patient Care Team: Steele Sizer, MD as PCP - General (Family Medicine) Christene Lye, MD (General Surgery) Steele Sizer, MD as Attending Physician (Family Medicine) Rico Junker, RN as Oncology Nurse Navigator Cammie Sickle, MD as Consulting Physician (Internal Medicine)  Cancer Staging No matching staging information was found for the patient.   Oncology History   # DEC 2017- RIGHT BREAST Stage II [pT1cpN1sn; screening] ER/PR- Pos- 90%; her 2 NEU- NEG s/p Lumpect & SLNBx; Dr.Sankar ]; JAN 2018- ddAC -T x12. AUG 2nd [finished RT].  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  female pleasant patient above history of stage II breast cancer ER PR positive currently on anastrozole is in for follow-up.  Patient states her joint pains/hot flashes are stable.  Not any worse.  She states to be compliant with her anastrozole.  Review of Systems  Constitutional: Positive for malaise/fatigue. Negative for chills, diaphoresis, fever and weight loss.  HENT: Negative for nosebleeds and sore throat.   Eyes: Negative for double vision.  Respiratory: Negative for cough, hemoptysis, sputum production, shortness of breath and wheezing.   Cardiovascular: Negative for chest pain, palpitations, orthopnea and leg swelling.  Gastrointestinal: Negative for abdominal pain, blood in stool, constipation, diarrhea, heartburn, melena, nausea  and vomiting.  Genitourinary: Negative for dysuria, frequency and urgency.  Musculoskeletal: Positive for joint pain and myalgias. Negative for back pain.  Skin: Negative.  Negative for itching and rash.  Neurological: Negative for dizziness, tingling, focal weakness, weakness and headaches.  Endo/Heme/Allergies: Does not bruise/bleed easily.  Psychiatric/Behavioral: The patient is nervous/anxious.       PAST MEDICAL HISTORY :  Past Medical History:  Diagnosis Date  . Anemia   . Anxiety   . Breast cancer ([place]) 09/2016  . Bursitis of right shoulder   . Cancer [facility])    breast  . 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 Cataract, bilateral   . Chemotherapy induced nausea and vomiting   . Cold    states she has had it for 2 months  . Depression   . GERD (gastroesophageal reflux disease)    uses baking soda for symptoms  . Insomnia   . Lumbago   . Personal history of chemotherapy 11/22/2016  . Personal history of radiation therapy 01/20/2017  . Vitamin D deficiency     PAST SURGICAL HISTORY :   Past Surgical History:  Procedure Laterality Date  . ABDOMINAL HYSTERECTOMY  04/20/2014  . BILATERAL SALPINGECTOMY    . BREAST BIOPSY Right 09/27/2016   positive  . BREAST LUMPECTOMY Right 10/12/2016   positive  . BREAST LUMPECTOMY WITH SENTINEL LYMPH NODE BIOPSY Right 10/12/2016   Procedure: BREAST LUMPECTOMY WITH SENTINEL LYMPH NODE BX;  Surgeon: Christene Lye, MD;  Location: [facility];  Service: General;  Laterality: Right;  . COLONOSCOPY WITH PROPOFOL N/A 05/20/2016   Procedure: COLONOSCOPY WITH PROPOFOL;  Surgeon: Lucilla Lame, MD;  Location: [place];  Service: Endoscopy;  Laterality: N/A;  . ECTOPIC PREGNANCY SURGERY    . ENDOMETRIAL ABLATION    . PORTACATH PLACEMENT Left 11/04/2016   Procedure: INSERTION PORT-A-CATH;  Surgeon: Christene Lye, MD;  Location: [facility];  Service: General;   Laterality: Left;  Left subclavian vein  . TUBAL LIGATION      FAMILY HISTORY :   Family History  Problem Relation Age of Onset  . Depression Father   . Obesity Father   . Alcohol abuse Brother   . Seizures Brother   . Breast cancer Sister 40    SOCIAL HISTORY:   Social History   Tobacco Use  . Smoking status: Former Smoker    Packs/day: 0.50    Years: 20.00    Pack years: 10.00    Types: Cigarettes    Last attempt to quit: 08/23/2016    Years since quitting: 2.0  . Smokeless tobacco: Never Used  Substance Use Topics  . Alcohol use: Yes    Alcohol/week: 0.0 standard drinks    Comment: occassional  . Drug use: No    ALLERGIES:  is allergic to adhesive [tape] and nickel.  MEDICATIONS:  Current Outpatient Medications  Medication Sig Dispense Refill  . acetaminophen (TYLENOL) 325 MG tablet Take 325 mg by mouth every 6 (six) hours as needed for fever.    Marland Kitchen albuterol (PROVENTIL HFA;VENTOLIN HFA) 108 (90 Base) MCG/ACT inhaler Inhale 2 puffs into the lungs every 4 (four) hours as needed for wheezing or shortness of breath. 1 Inhaler 1  . anastrozole (ARIMIDEX) 1 MG tablet Take 1 tablet (1 mg total) by mouth daily. 30 tablet 0  . Investigational aspirin/placebo 300 MG tablet Alliance [id number] Take 1 tablet by mouth daily. Take with food or a full glass of water.  Do not crush enteric coated tablets. 180 tablet 0  . meloxicam (MOBIC) 15 MG tablet TAKE 1 TABLET BY MOUTH  DAILY 90 tablet 0  . Multiple Vitamin (MULTIVITAMIN WITH MINERALS) TABS tablet Take 1 tablet by mouth daily. One-A-Day    . omeprazole (PRILOSEC) 20 MG capsule TAKE 1 CAPSULE BY MOUTH  DAILY 90 capsule 0  . traZODone (DESYREL) 50 MG tablet TAKE 1/2 TO 1 TABLET BY  MOUTH AT BEDTIME AS NEEDED  FOR SLEEP. (Patient not taking: Reported on 08/25/2018) 90 tablet 0  . zolpidem (AMBIEN CR) 6.25 MG CR tablet Take 1 tablet (6.25 mg total) by mouth at bedtime as needed for sleep. (Patient not taking: Reported on 08/25/2018) 30  tablet 0   No current facility-administered medications for this visit.     PHYSICAL EXAMINATION: ECOG PERFORMANCE STATUS: 0 - Asymptomatic  BP (!) 159/80 (BP Location: Left Arm, Patient Position: Sitting)   Pulse 79   Temp 97.8 F (36.6 C) (Tympanic)   Resp 16   Wt 194 lb 3.2 oz (88.1 kg)   BMI 35.52 kg/m   Filed Weights   08/25/18 1049  Weight: 194 lb 3.2 oz (88.1 kg)    GENERAL: Well-nourished well-developed; Alert, no distress and comfortable.  1. EYES: no pallor or icterus OROPHARYNX: no thrush or ulceration; NECK: supple; no lymph nodes felt. LYMPH:  no palpable lymphadenopathy in the axillary or inguinal regions LUNGS: Decreased breath sounds auscultation bilaterally. No wheeze or crackles HEART/CVS: regular rate & rhythm and no murmurs; No lower extremity edema ABDOMEN:abdomen soft, non-tender and normal bowel sounds. No hepatomegaly or splenomegaly.  Musculoskeletal:no cyanosis of digits and no  clubbing  PSYCH: alert & oriented x 3 with fluent speech; anxious/tearful. NEURO: no focal motor/sensory deficits SKIN:  no rashes or significant lesions    LABORATORY DATA:  I have reviewed the data as listed    Component Value Date/Time   NA 144 05/26/2018 1057   K 4.6 05/26/2018 1057   CL 105 05/26/2018 1057   CO2 23 05/26/2018 1057   GLUCOSE 98 05/26/2018 1057   GLUCOSE 102 (H) 02/08/2018 0838   BUN 9 05/26/2018 1057   CREATININE 0.73 05/26/2018 1057   CALCIUM 9.7 05/26/2018 1057   PROT 6.3 05/26/2018 1057   ALBUMIN 4.4 05/26/2018 1057   AST 21 05/26/2018 1057   ALT 30 05/26/2018 1057   ALKPHOS 112 05/26/2018 1057   BILITOT 0.2 05/26/2018 1057   GFRNONAA 95 05/26/2018 1057   GFRAA 110 05/26/2018 1057    No results found for: SPEP, UPEP  Lab Results  Component Value Date   WBC 4.9 05/31/2017   NEUTROABS 3.3 05/31/2017   HGB 13.3 05/31/2017   HCT 39.7 05/31/2017   MCV 85.9 05/31/2017   PLT 233 05/31/2017      Chemistry      Component Value  Date/Time   NA 144 05/26/2018 1057   K 4.6 05/26/2018 1057   CL 105 05/26/2018 1057   CO2 23 05/26/2018 1057   BUN 9 05/26/2018 1057   CREATININE 0.73 05/26/2018 1057      Component Value Date/Time   CALCIUM 9.7 05/26/2018 1057   ALKPHOS 112 05/26/2018 1057   AST 21 05/26/2018 1057   ALT 30 05/26/2018 1057   BILITOT 0.2 05/26/2018 1057       RADIOGRAPHIC STUDIES: I have personally reviewed the radiological images as listed and agreed with the findings in the report. No results found.   ASSESSMENT & PLAN:  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, estrogen receptor positive ([place]) # Breast cancer-stage II ER/PR positive HER-2/neu negative; continue Arimidex tolerating well except for myalgias.  # Clinically no evidence of recurrence.  Continue anastrozole. STABLE.   #Continues to be in the ABC trial/also on trial involving lifestyle changes.  # Bone loss- screen for osteoporosis.  Bone density prior to next visit.  # Joint pain/back pain-secondary to Arimidex. STABLE.   # Hot flashes grade 1; monitor for now. STABLE.   # follow up in 3 months/ no labs/mammogram/BMD.    Orders Placed This Encounter  Procedures  . DG Bone Density    Standing Status:   Future    Standing Expiration Date:   08/25/2019    Order Specific Question:   Reason for Exam (SYMPTOM  OR DIAGNOSIS REQUIRED)    Answer:   aromatase inhibitor use; joint pain; history of breast cancer    Order Specific Question:   Is the patient pregnant?    Answer:   No    Order Specific Question:   Preferred imaging location?    Answer:   [facility]   All questions were answered. The patient knows to call the clinic with any problems, questions or concerns.      Cammie Sickle, MD 08/25/2018 11:48 AM

## 2018-08-25 NOTE — Progress Notes (Signed)
BWEL 12 month visit. Ms. Crockett presented to the clinic this morning for her 7 month BWEL [id number] study visit. Patient reports she is gaining weight instead of losing it. Her weight was checked twice with patient wearing light weight clothing and shoes removed. She weighed 88.1kg both times, which is about 4 kg more than her last study visit on 02/08/18. Dr. Rogue Bussing examined the patient and is ordering her annual mammogram as well as a bone density test on 11/06/2018.  She continues to take her Arimidex with some ongoing hot flashes. Patient also experiencing a trace of edema in BLE and has a few bruises scattered over her arms and legs bilaterally, which she attributes to her new dog "jumping up" on her all the time. Shereports occasional mild neuropathy in fingers and hands, which she has experienced off and on since her chemotherapy. Reports insomnia also continues, but she has no Ambien and states she is not taking the Trazodone that has been prescribed. Patient also reports that she vomits 5-6 times per week, whenever she eats fatty foods, but does not take anything for this and states it is not new.  Solicited AE's for BWEL were assessed and patient denies experiencing any of these events. Ms. Gibas had hip and waist measurements performed twice each.  Her average hip circumference was 116.75 cm and average waist circumference was 96.5 cm. Ms. Glandon also completed her BWEL Follow Up study Questionnaire (Version 01/23/18, Update #05) while in clinic.  She is scheduled to see Dr. Rogue Bussing in 3 months following her mammogram and Dexa scan.  Her last bilateral breast tomogram was performed on 11/04/17 and showed no disease progression.  Ms. Hutmacher verifies receiving her study materials via mail and her coach contacts her via her mobile phone.  She denies questions or concerns about the study for this visit but was encouraged to call if any problems should arise. AEs and solicited AEs as  follows:   Study/Protocol: [id number] BWEL Cycle: 6-12 months Event Grade Onset Date Resolved Date Drug Name Attribution Treatment Comments  Bruising 1 07/26/18 approx 1 month  Aspirin/placebo possible  Pt reports her new dog is responsible  Bilateral pedal edema 1 Reported 03/08/17   unrelated    Insomnia 1 Medical History   unrelated has Trazodone but not taking   Hot flashes 1 Reported 02/08/18   unrelated  r/t Arimidex  Neuropathy in fingers 1 04/05/17   unrelated  States this is from chemo  Vomiting 1 unknown   unrelated  When she eats fatty foods  Yolande Jolly, BSN, MHA, OCN 08/25/2018 11:25 AM

## 2018-08-25 NOTE — Progress Notes (Signed)
Please see [id number] BWEL note dated 08/25/18 for start dates of adverse events. Yolande Jolly, BSN, MHA, OCN 08/25/2018 1:29 PM

## 2018-08-25 NOTE — Assessment & Plan Note (Addendum)
#  Breast cancer-stage II ER/PR positive HER-2/neu negative; continue Arimidex tolerating well except for myalgias.  # Clinically no evidence of recurrence.  Continue anastrozole. STABLE.   #Continues to be in the ABC trial/also on trial involving lifestyle changes.  # Bone loss- screen for osteoporosis.  Bone density prior to next visit.  # Joint pain/back pain-secondary to Arimidex. STABLE.   # Hot flashes grade 1; monitor for now. STABLE.   # follow up in 3 months/ no labs/mammogram/BMD.

## 2018-09-11 ENCOUNTER — Other Ambulatory Visit: Payer: Self-pay | Admitting: Nurse Practitioner

## 2018-09-15 ENCOUNTER — Ambulatory Visit: Payer: Managed Care, Other (non HMO) | Admitting: Family Medicine

## 2018-11-06 ENCOUNTER — Ambulatory Visit
Admission: RE | Admit: 2018-11-06 | Discharge: 2018-11-06 | Disposition: A | Discharge status: Home / Self Care | Payer: Managed Care, Other (non HMO) | Source: Ambulatory Visit | Attending: Internal Medicine | Admitting: Internal Medicine

## 2018-11-06 DIAGNOSIS — M2550 Pain in unspecified joint: Secondary | ICD-10-CM | POA: Insufficient documentation

## 2018-11-06 DIAGNOSIS — Z79811 Long term (current) use of aromatase inhibitors: Secondary | ICD-10-CM | POA: Insufficient documentation

## 2018-11-06 DIAGNOSIS — C50511 Malignant neoplasm of lower-outer quadrant of right female breast: Secondary | ICD-10-CM

## 2018-11-06 DIAGNOSIS — Z170 Estrogen receptor positive status [ER+]: Principal | ICD-10-CM

## 2018-11-23 ENCOUNTER — Other Ambulatory Visit: Payer: Self-pay | Admitting: Family Medicine

## 2018-11-23 DIAGNOSIS — K219 Gastro-esophageal reflux disease without esophagitis: Secondary | ICD-10-CM

## 2018-11-24 ENCOUNTER — Other Ambulatory Visit: Payer: Self-pay

## 2018-11-24 ENCOUNTER — Encounter: Payer: Self-pay | Admitting: *Deleted

## 2018-11-24 ENCOUNTER — Encounter: Payer: Self-pay | Admitting: Internal Medicine

## 2018-11-24 ENCOUNTER — Inpatient Hospital Stay: Payer: Managed Care, Other (non HMO) | Attending: Internal Medicine | Admitting: Internal Medicine

## 2018-11-24 VITALS — BP 140/84 | HR 83 | Temp 97.5°F | Resp 20 | Ht 62.0 in | Wt 188.0 lb

## 2018-11-24 DIAGNOSIS — C50511 Malignant neoplasm of lower-outer quadrant of right female breast: Secondary | ICD-10-CM | POA: Insufficient documentation

## 2018-11-24 DIAGNOSIS — Z87891 Personal history of nicotine dependence: Secondary | ICD-10-CM | POA: Insufficient documentation

## 2018-11-24 DIAGNOSIS — Z79899 Other long term (current) drug therapy: Secondary | ICD-10-CM | POA: Insufficient documentation

## 2018-11-24 DIAGNOSIS — Z170 Estrogen receptor positive status [ER+]: Secondary | ICD-10-CM | POA: Insufficient documentation

## 2018-11-24 DIAGNOSIS — Z79811 Long term (current) use of aromatase inhibitors: Secondary | ICD-10-CM | POA: Insufficient documentation

## 2018-11-24 DIAGNOSIS — Z923 Personal history of irradiation: Secondary | ICD-10-CM | POA: Insufficient documentation

## 2018-11-24 DIAGNOSIS — M8580 Other specified disorders of bone density and structure, unspecified site: Secondary | ICD-10-CM | POA: Insufficient documentation

## 2018-11-24 DIAGNOSIS — Z9221 Personal history of antineoplastic chemotherapy: Secondary | ICD-10-CM | POA: Insufficient documentation

## 2018-11-24 DIAGNOSIS — Z7982 Long term (current) use of aspirin: Secondary | ICD-10-CM | POA: Insufficient documentation

## 2018-11-24 NOTE — Progress Notes (Signed)
Patient not seen by MD. Patient checked in by RN. Patient walked out without being seeing md.

## 2018-11-24 NOTE — Telephone Encounter (Signed)
Pt has the option of seeing Raquel Sarna if Dr Ancil Boozer is booked

## 2018-11-24 NOTE — Telephone Encounter (Signed)
Refill request for general medication: Omeprazole 20 mg  Last office visit: 06/14/2018  Last physical exam: 11/09/2017   Follow-ups on file. None indicated

## 2018-11-24 NOTE — Telephone Encounter (Signed)
Called [phone] @ 12:05 not able to leave message due to mailbox being full. Pt needs an appt

## 2018-12-09 ENCOUNTER — Other Ambulatory Visit: Payer: Self-pay | Admitting: Family Medicine

## 2018-12-09 DIAGNOSIS — K219 Gastro-esophageal reflux disease without esophagitis: Secondary | ICD-10-CM

## 2018-12-13 ENCOUNTER — Inpatient Hospital Stay (HOSPITAL_BASED_OUTPATIENT_CLINIC_OR_DEPARTMENT_OTHER): Payer: Managed Care, Other (non HMO) | Admitting: Internal Medicine

## 2018-12-13 ENCOUNTER — Other Ambulatory Visit: Payer: Self-pay

## 2018-12-13 VITALS — BP 139/73 | HR 112 | Temp 97.6°F | Resp 20 | Ht 62.0 in | Wt 191.0 lb

## 2018-12-13 DIAGNOSIS — Z87891 Personal history of nicotine dependence: Secondary | ICD-10-CM

## 2018-12-13 DIAGNOSIS — Z170 Estrogen receptor positive status [ER+]: Secondary | ICD-10-CM

## 2018-12-13 DIAGNOSIS — M8580 Other specified disorders of bone density and structure, unspecified site: Secondary | ICD-10-CM

## 2018-12-13 DIAGNOSIS — Z79899 Other long term (current) drug therapy: Secondary | ICD-10-CM | POA: Diagnosis not present

## 2018-12-13 DIAGNOSIS — Z79811 Long term (current) use of aromatase inhibitors: Secondary | ICD-10-CM

## 2018-12-13 DIAGNOSIS — Z7982 Long term (current) use of aspirin: Secondary | ICD-10-CM

## 2018-12-13 DIAGNOSIS — Z9221 Personal history of antineoplastic chemotherapy: Secondary | ICD-10-CM | POA: Diagnosis not present

## 2018-12-13 DIAGNOSIS — C50511 Malignant neoplasm of lower-outer quadrant of right female breast: Secondary | ICD-10-CM

## 2018-12-13 DIAGNOSIS — Z923 Personal history of irradiation: Secondary | ICD-10-CM

## 2018-12-13 NOTE — Progress Notes (Signed)
[place] [facility] OFFICE PROGRESS NOTE  Patient Care Team: Steele Sizer, MD as PCP - General (Family Medicine) Christene Lye, MD (General Surgery) Steele Sizer, MD as Attending Physician (Family Medicine) Rico Junker, RN as Oncology Nurse Navigator Cammie Sickle, MD as Consulting Physician (Internal Medicine)  Cancer Staging No matching staging information was found for the patient.   Oncology History   # DEC 2017- RIGHT BREAST Stage II [pT1cpN1sn; screening] ER/PR- Pos- 90%; her 2 NEU- NEG s/p Lumpect & SLNBx; Dr.Sankar ]; JAN 2018- ddAC -T x12. AUG 2nd [finished RT].  # AUG 21st 2018- Start TAM; Nov 2018- Stopped sec intol/mood swings; arthralgias];  DEC 2019- Arimidex 1 mg/day   ---------------------------------------------------------  # MRI liver- hemagiomas  # Genetic counseling- DICER-1 VUS**  # TAH & BSO [Dr.Hall; [facility]; 2014]; smoking   DIAGNOSIS: BREAST CA  STAGE:   II      ;GOALS: cure  CURRENT/MOST RECENT THERAPY: Anastrazole      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, estrogen receptor positive ([place])      INTERVAL HISTORY:  Shelby Barry 53 y.o.  female pleasant patient above history of stage II breast cancer ER PR positive currently on anastrozole is in for follow-up.  Patient continues to have chronic joint pains which are fairly stable.  Not any worse.  Continues to have intermittent hot flashes.  Stable.  She states to be compliant with her anastrozole.   Review of Systems  Constitutional: Positive for malaise/fatigue. Negative for chills, diaphoresis, fever and weight loss.  HENT: Negative for nosebleeds and sore throat.   Eyes: Negative for double vision.  Respiratory: Negative for cough, hemoptysis, sputum production, shortness of breath and wheezing.   Cardiovascular: Negative for chest pain, palpitations, orthopnea and leg swelling.  Gastrointestinal: Negative for abdominal  pain, blood in stool, constipation, diarrhea, heartburn, melena, nausea and vomiting.  Genitourinary: Negative for dysuria, frequency and urgency.  Musculoskeletal: Positive for joint pain and myalgias. Negative for back pain.  Skin: Negative.  Negative for itching and rash.  Neurological: Negative for dizziness, tingling, focal weakness, weakness and headaches.  Endo/Heme/Allergies: Does not bruise/bleed easily.  Psychiatric/Behavioral: The patient is nervous/anxious.       PAST MEDICAL HISTORY :  Past Medical History:  Diagnosis Date  . Anemia   . Anxiety   . Breast cancer ([place]) 09/2016   right breast  . Bursitis of right shoulder   . Cancer [facility])    breast  . 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 Cataract, bilateral   . Chemotherapy induced nausea and vomiting   . Cold    states she has had it for 2 months  . Depression   . GERD (gastroesophageal reflux disease)    uses baking soda for symptoms  . Insomnia   . Lumbago   . Personal history of chemotherapy 11/22/2016  . Personal history of radiation therapy 01/20/2017  . Vitamin D deficiency     PAST SURGICAL HISTORY :   Past Surgical History:  Procedure Laterality Date  . ABDOMINAL HYSTERECTOMY  04/20/2014  . BILATERAL SALPINGECTOMY    . BREAST BIOPSY Right 09/27/2016   positive  . BREAST LUMPECTOMY Right 10/12/2016   positive  . BREAST LUMPECTOMY WITH SENTINEL LYMPH NODE BIOPSY Right 10/12/2016   Procedure: BREAST LUMPECTOMY WITH SENTINEL LYMPH NODE BX;  Surgeon: Christene Lye, MD;  Location: [facility];  Service: General;  Laterality: Right;  . COLONOSCOPY WITH PROPOFOL N/A 05/20/2016   Procedure: COLONOSCOPY WITH PROPOFOL;  Surgeon: Lucilla Lame, MD;  Location: [place];  Service: Endoscopy;  Laterality: N/A;  . ECTOPIC PREGNANCY SURGERY    . ENDOMETRIAL ABLATION    . PORTACATH PLACEMENT Left 11/04/2016   Procedure: INSERTION  PORT-A-CATH;  Surgeon: Christene Lye, MD;  Location: [facility];  Service: General;  Laterality: Left;  Left subclavian vein  . TUBAL LIGATION      FAMILY HISTORY :   Family History  Problem Relation Age of Onset  . Depression Father   . Obesity Father   . Alcohol abuse Brother   . Seizures Brother   . Breast cancer Sister 5    SOCIAL HISTORY:   Social History   Tobacco Use  . Smoking status: Former Smoker    Packs/day: 0.50    Years: 20.00    Pack years: 10.00    Types: Cigarettes    Last attempt to quit: 08/23/2016    Years since quitting: 2.3  . Smokeless tobacco: Never Used  Substance Use Topics  . Alcohol use: Yes    Alcohol/week: 0.0 standard drinks    Comment: occassional  . Drug use: No    ALLERGIES:  is allergic to adhesive [tape] and nickel.  MEDICATIONS:  Current Outpatient Medications  Medication Sig Dispense Refill  . acetaminophen (TYLENOL) 325 MG tablet Take 325 mg by mouth every 6 (six) hours as needed for fever.    Marland Kitchen albuterol (PROVENTIL HFA;VENTOLIN HFA) 108 (90 Base) MCG/ACT inhaler Inhale 2 puffs into the lungs every 4 (four) hours as needed for wheezing or shortness of breath. 1 Inhaler 1  . Investigational aspirin/placebo 300 MG tablet Alliance [id number] Take 1 tablet by mouth daily. Take with food or a full glass of water.  Do not crush enteric coated tablets. 180 tablet 0  . meloxicam (MOBIC) 15 MG tablet TAKE 1 TABLET BY MOUTH  DAILY 90 tablet 0  . Multiple Vitamin (MULTIVITAMIN WITH MINERALS) TABS tablet Take 1 tablet by mouth daily. One-A-Day    . omeprazole (PRILOSEC) 20 MG capsule TAKE 1 CAPSULE BY MOUTH  DAILY 90 capsule 0  . anastrozole (ARIMIDEX) 1 MG tablet TAKE 1 TABLET BY MOUTH  DAILY 90 tablet 0   No current facility-administered medications for this visit.     PHYSICAL EXAMINATION: ECOG PERFORMANCE STATUS: 0 - Asymptomatic  BP 139/73 (Patient Position: Sitting)   Pulse (!) 112   Temp 97.6 F (36.4 C) (Oral)   Resp 20    Ht '5\' 2"'  (1.575 m)   Wt 191 lb (86.6 kg)   BMI 34.93 kg/m   Filed Weights   12/13/18 1508  Weight: 191 lb (86.6 kg)    Physical Exam  Constitutional: She is oriented to person, place, and time and well-developed, well-nourished, and in no distress.  HENT:  Head: Normocephalic and atraumatic.  Mouth/Throat: Oropharynx is clear and moist. No oropharyngeal exudate.  Eyes: Pupils are equal, round, and reactive to light.  Neck: Normal range of motion. Neck supple.  Cardiovascular: Normal rate and regular rhythm.  Pulmonary/Chest: No respiratory distress. She has no wheezes.  Abdominal: Soft. Bowel sounds are normal. She exhibits no distension and no mass. There is no abdominal tenderness. There is no rebound and no guarding.  Musculoskeletal: Normal range of motion.        General: No tenderness or edema.  Neurological: She is alert and oriented to person, place, and time.  Skin: Skin is warm.  Psychiatric: Affect normal.      LABORATORY DATA:  I have reviewed the data as listed    Component Value Date/Time   NA 144 05/26/2018 1057   K 4.6 05/26/2018 1057   CL 105 05/26/2018 1057   CO2 23 05/26/2018 1057   GLUCOSE 98 05/26/2018 1057   GLUCOSE 102 (H) 02/08/2018 0838   BUN 9 05/26/2018 1057   CREATININE 0.73 05/26/2018 1057   CALCIUM 9.7 05/26/2018 1057   PROT 6.3 05/26/2018 1057   ALBUMIN 4.4 05/26/2018 1057   AST 21 05/26/2018 1057   ALT 30 05/26/2018 1057   ALKPHOS 112 05/26/2018 1057   BILITOT 0.2 05/26/2018 1057   GFRNONAA 95 05/26/2018 1057   GFRAA 110 05/26/2018 1057    No results found for: SPEP, UPEP  Lab Results  Component Value Date   WBC 4.9 05/31/2017   NEUTROABS 3.3 05/31/2017   HGB 13.3 05/31/2017   HCT 39.7 05/31/2017   MCV 85.9 05/31/2017   PLT 233 05/31/2017      Chemistry      Component Value Date/Time   NA 144 05/26/2018 1057   K 4.6 05/26/2018 1057   CL 105 05/26/2018 1057   CO2 23 05/26/2018 1057   BUN 9 05/26/2018 1057    CREATININE 0.73 05/26/2018 1057      Component Value Date/Time   CALCIUM 9.7 05/26/2018 1057   ALKPHOS 112 05/26/2018 1057   AST 21 05/26/2018 1057   ALT 30 05/26/2018 1057   BILITOT 0.2 05/26/2018 1057       RADIOGRAPHIC STUDIES: I have personally reviewed the radiological images as listed and agreed with the findings in the report. No results found.   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  # Clinically no evidence of recurrence.  Continue anastrozole. Marland Kitchen   #Continues to be in the ABC trial/also on trial involving lifestyle changes.  Stable  #Osteopenia T score -2.1; recommend calcium plus vitamin D.  # Joint pain/back pain-secondary to Arimidex.  Stable  # Hot flashes grade 1; monitor for now. S stable  # DISPOSITION:  # follow up in - 1st week of April / no labs- Dr.B   No orders of the defined types were placed in this encounter.  All questions were answered. The patient knows to call the clinic with any problems, questions or concerns.      Cammie Sickle, MD 01/14/2019 6:21 PM

## 2018-12-13 NOTE — Assessment & Plan Note (Addendum)
#  Breast cancer-stage II ER/PR positive HER-2/neu negative-stable.  # Clinically no evidence of recurrence.  Continue anastrozole. .   #Continues to be in the ABC trial/also on trial involving lifestyle changes.  Stable  #Osteopenia T score -2.1; recommend calcium plus vitamin D.  # Joint pain/back pain-secondary to Arimidex.  Stable  # Hot flashes grade 1; monitor for now. S stable  # DISPOSITION:  # follow up in - 1st week of April / no labs- Dr.B 

## 2018-12-27 ENCOUNTER — Other Ambulatory Visit: Payer: Self-pay | Admitting: Internal Medicine

## 2019-01-14 NOTE — Progress Notes (Signed)
Pt left without being seen.

## 2019-02-06 ENCOUNTER — Encounter: Payer: Self-pay | Admitting: *Deleted

## 2019-02-06 DIAGNOSIS — Z170 Estrogen receptor positive status [ER+]: Secondary | ICD-10-CM

## 2019-02-06 NOTE — Research (Signed)
Received t/c from patient Shelby Barry yesterday afternoon reporting that she is almost out of her study drug for the [id number] ABC study and will not have enough to last until her next appointment. Patient made arrangements to pick up her study drug today prior to going to work. She preferred not to come into the [facility] due to having to be screened for COVID-19. Study drug was dispensed from the pharmacy and Aspirin/placebo 300mg  tabs, bottle of 200 tabs with patient ID [id number] was taken outside to patient in her vehicle along with 6 months of new medication calendars. Patient did not return her old calendars but plans to bring them to her next MD visit on 02/21/2019 - giving her time to complete the March calendar. Study drug was verified by both myself and Dicie Beam, the pharmacist, prior to dispensing to patient. She has instructions on taking the medication and is aware that she can contact me for any questions. Yolande Jolly, BSN, MHA, OCN 02/06/2019 4:04 PM

## 2019-02-20 ENCOUNTER — Other Ambulatory Visit: Payer: Self-pay

## 2019-02-21 ENCOUNTER — Encounter: Payer: Self-pay | Admitting: *Deleted

## 2019-02-21 ENCOUNTER — Inpatient Hospital Stay: Payer: Managed Care, Other (non HMO) | Attending: Internal Medicine | Admitting: Internal Medicine

## 2019-02-21 DIAGNOSIS — C50511 Malignant neoplasm of lower-outer quadrant of right female breast: Secondary | ICD-10-CM

## 2019-02-21 DIAGNOSIS — Z170 Estrogen receptor positive status [ER+]: Secondary | ICD-10-CM

## 2019-02-21 NOTE — Progress Notes (Unsigned)
I connected with @NAME  @ on 02/21/19 at  2:15 PM EDTby telephone and verified that I am speaking with the patient using 2 identifiers.  # LOCATION:  Patient:*** Provider: ***  I discussed the limitations, risks, security and privacy concerns of performing an evaluation and management service by telephone and the availability of in person appointments.  I also discussed with the patient that there may be a patient responsible charge related to the service.  The patient expressed understanding and agrees to proceed.  History of present illness:Shelby Barry 53 y.o.  female with history of   Observation/objective:  Assessment and plan: No problem-specific Assessment & Plan notes found for this encounter.    Follow-up instructions:  I discussed the assessment and treatment plan with the patient.  The patient was provided an opportunity to ask questions and all were answered.  The patient agreed with the plan and demonstrated understanding of instructions.  The patient was advised to call back or seek an in person evaluation if the symptoms worsen or if the condition fails to improve as anticipated.  I provided ***minutes of non-face-to-face time during this encounter   Dr. Charlaine Dalton [facility] at [facility] 02/21/2019 9:28 AM

## 2019-02-21 NOTE — Progress Notes (Signed)
T/C made to [employer] for purpose of following up for ABC and BWEL Research studies. She will have a Tele-visit with Dr. Rogue Bussing later this afternoon in following with COVID-19 clinic guidelines for this patient's acuity.  BWEL 12 month visit: Ms. Nordquist was also interviewed for her 47 month BWEL [id number] study follow up. Patient reports she has not been successful with this study, but has also not been motivate. States her coach Ulice Dash gets frustrated with her.  Solicited AE's for BWEL were assessed and patient denies experiencing any fractures, sprains, tendon or ligament injuries,and has no thad any orthopedic surgery during the past 6 months. She did have a bone density test performed on 11/06/2018 and was found to have some osteopenia. Patient has developed a cough, which she reports is chronic due to allergies this time of year. States she is taking Tylenol sinus for the cough and nasal drainage. Reports she has not taken Arimidex since 12/17/2018 and states she has not reported this to Dr. Rogue Bussing. Stressed that she needs to tell him this today. Ms. Latella reports she is only taking a multi-vitamin, fish oil, omeprazole and her study drug. Reports she is not taking Arimidex, Mobic or Trazadone; which are all still on her active medication list. States hot flashes and insomnia are about the same, and continue to experience occasional vomiting when she eat fatty foods. Ms. Purnell was informed that because her visit today is being performed remotely, we will schedule a time after COVID-19 restrictions have been lifted to have her come to clinic for study required weight and hip/waist measurements. 18 month study questionnaire (Version 01/23/18, Update #05) was completed via phone call today. Bilateral diagnostic mammogram was performed on 11/06/18 and was negative for disease recurrence. Ms. Jefcoat verifies receiving her study materials via mail and her coach contacts her via her mobile phone. She denies  questions or concerns about the study for this visit but was encouraged to call if any problems should arise. 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

## 2019-02-21 NOTE — Progress Notes (Signed)
T/C made to [employer] for purpose of following up for ABC and BWEL Research studies. She will have a Tele-visit with Dr. Rogue Bussing later this afternoon in following with COVID-19 clinic guidelines for this patient's acuity.  ABC 18 Month follow up: Ms. Meche continues to participate in the [facility] [id number] study, and she ran out of study drug approximately 2 weeks ago. Ms. Urista contacted the research nurse and a new bottle of study drug Apsirin/placebo was dispensed to her on 02/06/2019. Ms. Berringer was also given medication calendars for the next 6 months. She was to return her past 6 months of calendars at today's visit, but this exchange will be rescheduled when COVID-19 restrictions are lifted. Informed Ms. Guirguis that we will schedule a research only clinic visit in order to check her weight and vital signs. Patient reports no change in her performance status and she continues to work full-time. Sh denies any medication changes and states she is taking the study drug daily as instructed. Solicited adverse events were assessed and patient denies experiencing any GI bleeding, intracranial hemorrhage, epistaxis, hematuria, dyspepsia, gastritis, or further bruising. State she has taken Ibuprofen on 3 different occasions in the past 6 months, but generally takes Tylenol. Patient has developed a cough, which she reports is chronic due to allergies this time of year. States she is taking Tylenol sinus for the cough and nasal drainage. Reports she has not taken Arimidex since 12/17/2018 and states she has not reported this to Dr. Rogue Bussing. Stressed that she needs to tell him this today. Ms. Rickles reports she is only taking a multi-vitamin, fish oil, omeprazole and her study drug. Reports she is not taking Arimidex, Mobic or Trazadone; which are all still on her active medication list. States hot flashes and insomnia are about the same, and continue to experience occasional vomiting when she eat fatty foods. Patient  did have her mammogram performed in December, 2019 and it was negative.She also had a bone density performed and this showed that she has some osteopenia. Her call with Dr. Rogue Bussing will occur at 2:15 this afternoon.  Ms. Jacuinde was instructed to call if she has any questions/concerns. 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

## 2019-02-22 ENCOUNTER — Encounter: Payer: Self-pay | Admitting: Internal Medicine

## 2019-02-22 ENCOUNTER — Inpatient Hospital Stay (HOSPITAL_BASED_OUTPATIENT_CLINIC_OR_DEPARTMENT_OTHER): Payer: Managed Care, Other (non HMO) | Admitting: Internal Medicine

## 2019-02-22 ENCOUNTER — Other Ambulatory Visit: Payer: Self-pay

## 2019-02-22 DIAGNOSIS — M8580 Other specified disorders of bone density and structure, unspecified site: Secondary | ICD-10-CM

## 2019-02-22 DIAGNOSIS — Z170 Estrogen receptor positive status [ER+]: Secondary | ICD-10-CM

## 2019-02-22 DIAGNOSIS — C50511 Malignant neoplasm of lower-outer quadrant of right female breast: Secondary | ICD-10-CM

## 2019-02-22 DIAGNOSIS — Z006 Encounter for examination for normal comparison and control in clinical research program: Secondary | ICD-10-CM

## 2019-02-22 NOTE — Progress Notes (Signed)
I connected with _0  @ on 02/22/19 at 10:00 AM EDTby telephone and verified that I am speaking with the patient using 2 identifiers.  # LOCATION:  Patient: home Provider: home  I discussed the limitations, risks, security and privacy concerns of performing an evaluation and management service by telephone and the availability of in person appointments.  I also discussed with the patient that there may be a patient responsible charge related to the service.  The patient expressed understanding and agrees to proceed.  History of present illness:Shelby Barry 53 y.o.  female with history of stage II ER PR positive breast cancer.  Patient has been currently off anastrozole, since January 2020 because of joint pains/myalgias.  She also experiencing fatigue on it.  Since being off anastrozole patient noted to have improvement of her joint pains myalgias.  Her fatigue is improved.  More recently patient noted to have stuffy nose; sneezing and coughing.  No fevers or chills or body aches.    Observation/objective: None.  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) # Breast cancer-stage II ER/PR positive HER-2/neu negative-stable.  #Clinically no concerns for recurrence.  Patient currently off anastrozole because of poor tolerance [see discussion below].  Given patient preference continue off therapy for now.  Will reevaluate in 2 months  #Body aches/joint pains-secondary to AI.  Currently improved.  #Continues to be in the ABC trial/also on trial involving lifestyle changes.  Stable  #Osteopenia T score -2.1; continue calcium plus vitamin D.  # Joint pain/back pain-secondary to Arimidex.  Currently improved off Arimidex.  # Hot flashes grade 1; monitor for now improved.  #Seasonal allergies-recommend Claritin D.  Do not suspect coronavirus infection.  # DISPOSITION:  # follow up in MD no labs in 2 months-  Dr.b    Follow-up instructions:  I discussed the assessment and treatment plan with the patient.  The patient was provided an opportunity to ask questions and all were answered.  The patient agreed with the plan and demonstrated understanding of instructions.  The patient was advised to call back or seek an in person evaluation if the symptoms worsen or if the condition fails to improve as anticipated.  I provided 5 minutes of non-face-to-face time during this encounter   Dr. Charlaine Dalton [facility] at [facility] 02/22/2019 11:06 AM

## 2019-02-22 NOTE — Assessment & Plan Note (Addendum)
#  Breast cancer-stage II ER/PR positive HER-2/neu negative-stable.  #Clinically no concerns for recurrence.  Patient currently off anastrozole because of poor tolerance [see discussion below].  Given patient preference continue off therapy for now.  Will reevaluate in 2 months  #Body aches/joint pains-secondary to AI.  Currently improved.  #Continues to be in the ABC trial/also on trial involving lifestyle changes.  Stable  #Osteopenia T score -2.1; continue calcium plus vitamin D.  # Joint pain/back pain-secondary to Arimidex.  Currently improved off Arimidex.  # Hot flashes grade 1; monitor for now improved.  #Seasonal allergies-recommend Claritin D.  Do not suspect coronavirus infection.  # DISPOSITION:  # follow up in MD no labs in 2 months- Dr.b 

## 2019-03-23 ENCOUNTER — Other Ambulatory Visit: Payer: Self-pay | Admitting: Internal Medicine

## 2019-03-27 ENCOUNTER — Other Ambulatory Visit: Payer: Self-pay | Admitting: Family Medicine

## 2019-03-27 DIAGNOSIS — K219 Gastro-esophageal reflux disease without esophagitis: Secondary | ICD-10-CM

## 2019-03-27 NOTE — Telephone Encounter (Signed)
Tried calling pt to schedule an appt Mailbox is full.

## 2019-03-30 ENCOUNTER — Other Ambulatory Visit: Payer: Self-pay | Admitting: Internal Medicine

## 2019-04-25 ENCOUNTER — Inpatient Hospital Stay: Payer: Managed Care, Other (non HMO) | Admitting: Internal Medicine

## 2019-06-05 ENCOUNTER — Other Ambulatory Visit: Payer: Self-pay

## 2019-06-06 ENCOUNTER — Inpatient Hospital Stay: Payer: Managed Care, Other (non HMO) | Admitting: Internal Medicine

## 2019-06-26 ENCOUNTER — Other Ambulatory Visit: Payer: Self-pay

## 2019-06-27 ENCOUNTER — Inpatient Hospital Stay: Payer: Managed Care, Other (non HMO) | Attending: Internal Medicine | Admitting: Internal Medicine

## 2019-06-27 ENCOUNTER — Encounter: Payer: Self-pay | Admitting: *Deleted

## 2019-06-27 ENCOUNTER — Telehealth: Payer: Self-pay | Admitting: *Deleted

## 2019-06-27 NOTE — Assessment & Plan Note (Deleted)
#  Breast cancer-stage II ER/PR positive HER-2/neu negative-stable.  #Clinically no concerns for recurrence.  Patient currently off anastrozole because of poor tolerance [see discussion below].  Given patient preference continue off therapy for now.  Will reevaluate in 2 months  #Body aches/joint pains-secondary to AI.  Currently improved.  #Continues to be in the ABC trial/also on trial involving lifestyle changes.  Stable  #Osteopenia T score -2.1; continue calcium plus vitamin D.  # Joint pain/back pain-secondary to Arimidex.  Currently improved off Arimidex.  # Hot flashes grade 1; monitor for now improved.  #Seasonal allergies-recommend Claritin D.  Do not suspect coronavirus infection.  # DISPOSITION:  # follow up in MD no labs in 2 months- Dr.b

## 2019-06-27 NOTE — Telephone Encounter (Signed)
unable to leave vm- vm box is full.  Patient no showed for today's visit. Will send letter to patient.

## 2019-06-27 NOTE — Progress Notes (Deleted)
[place] OFFICE PROGRESS NOTE  Patient Care Team: Steele Sizer, MD as PCP - General (Family Medicine) Christene Lye, MD (General Surgery) Steele Sizer, MD as Attending Physician (Family Medicine) Rico Junker, RN as Oncology Nurse Navigator Cammie Sickle, MD as Consulting Physician (Internal Medicine)  Cancer Staging No matching staging information was found for the patient.   Oncology History Overview Note  # DEC 2017- RIGHT BREAST Stage II [pT1cpN1sn; screening] ER/PR- Pos- 90%; her 2 NEU- NEG s/p Lumpect & SLNBx; Dr.Sankar ]; JAN 2018- ddAC -T x12. AUG 2nd [finished RT].  # AUG 21st 2018- Start TAM; Nov 2018- Stopped sec intol/mood swings; arthralgias];  DEC 2019- Arimidex 1 mg/day; STOPPED JAN 2020 sec to intolerarnce.   ---------------------------------------------------------  # MRI liver- hemagiomas  # Genetic counseling- DICER-1 VUS**  # TAH & BSO [Dr.Hall; [facility]; 2014]; smoking   DIAGNOSIS: BREAST CA  STAGE:   II      ;GOALS: cure  CURRENT/MOST RECENT THERAPY: Anastrazole    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, estrogen receptor positive ([name])      INTERVAL HISTORY:  Shelby Barry 53 y.o.  female pleasant patient above history of stage II breast cancer ER PR positive currently on anastrozole is in for follow-up.  Patient continues to have chronic joint pains which are fairly stable.  Not any worse.  Continues to have intermittent hot flashes.  Stable.  She states to be compliant with her anastrozole.   Review of Systems  Constitutional: Positive for malaise/fatigue. Negative for chills, diaphoresis, fever and weight loss.  HENT: Negative for nosebleeds and sore throat.   Eyes: Negative for double vision.  Respiratory: Negative for cough, hemoptysis, sputum production, shortness of breath and wheezing.   Cardiovascular: Negative for chest pain, palpitations, orthopnea and leg  swelling.  Gastrointestinal: Negative for abdominal pain, blood in stool, constipation, diarrhea, heartburn, melena, nausea and vomiting.  Genitourinary: Negative for dysuria, frequency and urgency.  Musculoskeletal: Positive for joint pain and myalgias. Negative for back pain.  Skin: Negative.  Negative for itching and rash.  Neurological: Negative for dizziness, tingling, focal weakness, weakness and headaches.  Endo/Heme/Allergies: Does not bruise/bleed easily.  Psychiatric/Behavioral: The patient is nervous/anxious.       PAST MEDICAL HISTORY :  Past Medical History:  Diagnosis Date  . Anemia   . Anxiety   . Breast cancer ([place]) 09/2016   right breast  . Bursitis of right shoulder   . Cancer [facility])    breast  . 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 Cataract, bilateral   . Chemotherapy induced nausea and vomiting   . Cold    states she has had it for 2 months  . Depression   . GERD (gastroesophageal reflux disease)    uses baking soda for symptoms  . Insomnia   . Lumbago   . Personal history of chemotherapy 11/22/2016  . Personal history of radiation therapy 01/20/2017  . Vitamin D deficiency     PAST SURGICAL HISTORY :   Past Surgical History:  Procedure Laterality Date  . ABDOMINAL HYSTERECTOMY  04/20/2014  . BILATERAL SALPINGECTOMY    . BREAST BIOPSY Right 09/27/2016   positive  . BREAST LUMPECTOMY Right 10/12/2016   positive  . BREAST LUMPECTOMY WITH SENTINEL LYMPH NODE BIOPSY Right 10/12/2016   Procedure: BREAST LUMPECTOMY WITH SENTINEL LYMPH NODE BX;  Surgeon: Christene Lye, MD;  Location:  [facility] ORS;  Service: General;  Laterality: Right;  . COLONOSCOPY WITH PROPOFOL N/A 05/20/2016   Procedure: COLONOSCOPY WITH PROPOFOL;  Surgeon: Lucilla Lame, MD;  Location: [place];  Service: Endoscopy;  Laterality: N/A;  . ECTOPIC PREGNANCY SURGERY    . ENDOMETRIAL ABLATION    . PORTACATH  PLACEMENT Left 11/04/2016   Procedure: INSERTION PORT-A-CATH;  Surgeon: Christene Lye, MD;  Location: [facility] ORS;  Service: General;  Laterality: Left;  Left subclavian vein  . TUBAL LIGATION      FAMILY HISTORY :   Family History  Problem Relation Age of Onset  . Depression Father   . Obesity Father   . Alcohol abuse Brother   . Seizures Brother   . Breast cancer Sister 57    SOCIAL HISTORY:   Social History   Tobacco Use  . Smoking status: Former Smoker    Packs/day: 0.50    Years: 20.00    Pack years: 10.00    Types: Cigarettes    Quit date: 08/23/2016    Years since quitting: 2.8  . Smokeless tobacco: Never Used  Substance Use Topics  . Alcohol use: Yes    Alcohol/week: 0.0 standard drinks    Comment: occassional  . Drug use: No    ALLERGIES:  is allergic to adhesive [tape] and nickel.  MEDICATIONS:  Current Outpatient Medications  Medication Sig Dispense Refill  . acetaminophen (TYLENOL) 325 MG tablet Take 325 mg by mouth every 6 (six) hours as needed for fever.    Marland Kitchen albuterol (PROVENTIL HFA;VENTOLIN HFA) 108 (90 Base) MCG/ACT inhaler Inhale 2 puffs into the lungs every 4 (four) hours as needed for wheezing or shortness of breath. 1 Inhaler 1  . anastrozole (ARIMIDEX) 1 MG tablet TAKE 1 TABLET BY MOUTH  DAILY 90 tablet 0  . Investigational aspirin/placebo 300 MG tablet [facility] [id number] Take 1 tablet by mouth daily. Take with food or a full glass of water.  Do not crush enteric coated tablets. 180 tablet 0  . meloxicam (MOBIC) 15 MG tablet TAKE 1 TABLET BY MOUTH  DAILY 90 tablet 0  . Multiple Vitamin (MULTIVITAMIN WITH MINERALS) TABS tablet Take 1 tablet by mouth daily. One-A-Day    . omeprazole (PRILOSEC) 20 MG capsule TAKE 1 CAPSULE BY MOUTH  DAILY 90 capsule 0   No current facility-administered medications for this visit.     PHYSICAL EXAMINATION: ECOG PERFORMANCE STATUS: 0 - Asymptomatic  There were no vitals taken for this visit.  There were no  vitals filed for this visit.  Physical Exam  Constitutional: She is oriented to person, place, and time and well-developed, well-nourished, and in no distress.  HENT:  Head: Normocephalic and atraumatic.  Mouth/Throat: Oropharynx is clear and moist. No oropharyngeal exudate.  Eyes: Pupils are equal, round, and reactive to light.  Neck: Normal range of motion. Neck supple.  Cardiovascular: Normal rate and regular rhythm.  Pulmonary/Chest: No respiratory distress. She has no wheezes.  Abdominal: Soft. Bowel sounds are normal. She exhibits no distension and no mass. There is no abdominal tenderness. There is no rebound and no guarding.  Musculoskeletal: Normal range of motion.        General: No tenderness or edema.  Neurological: She is alert and oriented to person, place, and time.  Skin: Skin is warm.  Psychiatric: Affect normal.      LABORATORY DATA:  I have reviewed the data as listed    Component Value Date/Time   NA 144 05/26/2018 1057  K 4.6 05/26/2018 1057   CL 105 05/26/2018 1057   CO2 23 05/26/2018 1057   GLUCOSE 98 05/26/2018 1057   GLUCOSE 102 (H) 02/08/2018 0838   BUN 9 05/26/2018 1057   CREATININE 0.73 05/26/2018 1057   CALCIUM 9.7 05/26/2018 1057   PROT 6.3 05/26/2018 1057   ALBUMIN 4.4 05/26/2018 1057   AST 21 05/26/2018 1057   ALT 30 05/26/2018 1057   ALKPHOS 112 05/26/2018 1057   BILITOT 0.2 05/26/2018 1057   GFRNONAA 95 05/26/2018 1057   GFRAA 110 05/26/2018 1057    No results found for: SPEP, UPEP  Lab Results  Component Value Date   WBC 4.9 05/31/2017   NEUTROABS 3.3 05/31/2017   HGB 13.3 05/31/2017   HCT 39.7 05/31/2017   MCV 85.9 05/31/2017   PLT 233 05/31/2017      Chemistry      Component Value Date/Time   NA 144 05/26/2018 1057   K 4.6 05/26/2018 1057   CL 105 05/26/2018 1057   CO2 23 05/26/2018 1057   BUN 9 05/26/2018 1057   CREATININE 0.73 05/26/2018 1057      Component Value Date/Time   CALCIUM 9.7 05/26/2018 1057    ALKPHOS 112 05/26/2018 1057   AST 21 05/26/2018 1057   ALT 30 05/26/2018 1057   BILITOT 0.2 05/26/2018 1057       RADIOGRAPHIC STUDIES: I have personally reviewed the radiological images as listed and agreed with the findings in the report. No results found.   ASSESSMENT & PLAN:  No problem-specific Assessment & Plan notes found for this encounter.   No orders of the defined types were placed in this encounter.  All questions were answered. The patient knows to call the clinic with any problems, questions or concerns.      Cammie Sickle, MD 06/27/2019 12:47 PM

## 2019-08-06 ENCOUNTER — Other Ambulatory Visit: Payer: Self-pay | Admitting: *Deleted

## 2019-08-06 DIAGNOSIS — C50511 Malignant neoplasm of lower-outer quadrant of right female breast: Secondary | ICD-10-CM

## 2019-08-07 ENCOUNTER — Encounter: Payer: Self-pay | Admitting: Internal Medicine

## 2019-08-07 ENCOUNTER — Other Ambulatory Visit: Payer: Self-pay

## 2019-08-07 NOTE — Progress Notes (Signed)
Patient stated that she is not currently taking her anastrozole. Patient also stated that she had been with a lowe back pain for the past month with hurting herself or doing any strenuous work and almost every other day with a headache.

## 2019-08-08 ENCOUNTER — Other Ambulatory Visit: Payer: Self-pay

## 2019-08-08 ENCOUNTER — Inpatient Hospital Stay: Payer: Managed Care, Other (non HMO)

## 2019-08-08 ENCOUNTER — Inpatient Hospital Stay: Payer: Managed Care, Other (non HMO) | Attending: Internal Medicine | Admitting: Internal Medicine

## 2019-08-08 DIAGNOSIS — Z006 Encounter for examination for normal comparison and control in clinical research program: Secondary | ICD-10-CM | POA: Insufficient documentation

## 2019-08-08 DIAGNOSIS — Z170 Estrogen receptor positive status [ER+]: Secondary | ICD-10-CM

## 2019-08-08 DIAGNOSIS — Z853 Personal history of malignant neoplasm of breast: Secondary | ICD-10-CM | POA: Insufficient documentation

## 2019-08-08 DIAGNOSIS — C50511 Malignant neoplasm of lower-outer quadrant of right female breast: Secondary | ICD-10-CM | POA: Diagnosis not present

## 2019-08-08 DIAGNOSIS — F17210 Nicotine dependence, cigarettes, uncomplicated: Secondary | ICD-10-CM | POA: Insufficient documentation

## 2019-08-08 DIAGNOSIS — M8580 Other specified disorders of bone density and structure, unspecified site: Secondary | ICD-10-CM | POA: Insufficient documentation

## 2019-08-08 DIAGNOSIS — N951 Menopausal and female climacteric states: Secondary | ICD-10-CM | POA: Insufficient documentation

## 2019-08-08 DIAGNOSIS — J42 Unspecified chronic bronchitis: Secondary | ICD-10-CM | POA: Insufficient documentation

## 2019-08-08 LAB — CBC WITH DIFFERENTIAL/PLATELET
Abs Immature Granulocytes: 0.01 10*3/uL (ref 0.00–0.07)
Basophils Absolute: 0 10*3/uL (ref 0.0–0.1)
Basophils Relative: 0 %
Eosinophils Absolute: 0.2 10*3/uL (ref 0.0–0.5)
Eosinophils Relative: 3 %
HCT: 42.6 % (ref 36.0–46.0)
Hemoglobin: 14 g/dL (ref 12.0–15.0)
Immature Granulocytes: 0 %
Lymphocytes Relative: 26 %
Lymphs Abs: 1.8 10*3/uL (ref 0.7–4.0)
MCH: 28.6 pg (ref 26.0–34.0)
MCHC: 32.9 g/dL (ref 30.0–36.0)
MCV: 86.9 fL (ref 80.0–100.0)
Monocytes Absolute: 0.4 10*3/uL (ref 0.1–1.0)
Monocytes Relative: 6 %
Neutro Abs: 4.4 10*3/uL (ref 1.7–7.7)
Neutrophils Relative %: 65 %
Platelets: 293 10*3/uL (ref 150–400)
RBC: 4.9 MIL/uL (ref 3.87–5.11)
RDW: 13.5 % (ref 11.5–15.5)
WBC: 6.9 10*3/uL (ref 4.0–10.5)
nRBC: 0 % (ref 0.0–0.2)

## 2019-08-08 LAB — COMPREHENSIVE METABOLIC PANEL
ALT: 29 U/L (ref 0–44)
AST: 22 U/L (ref 15–41)
Albumin: 4.2 g/dL (ref 3.5–5.0)
Alkaline Phosphatase: 99 U/L (ref 38–126)
Anion gap: 8 (ref 5–15)
BUN: 13 mg/dL (ref 6–20)
CO2: 30 mmol/L (ref 22–32)
Calcium: 9.4 mg/dL (ref 8.9–10.3)
Chloride: 102 mmol/L (ref 98–111)
Creatinine, Ser: 0.74 mg/dL (ref 0.44–1.00)
GFR calc Af Amer: >60 mL/min (ref >60)
GFR calc non Af Amer: >60 mL/min (ref >60)
Glucose, Bld: 100 mg/dL — ABNORMAL HIGH (ref 70–99)
Potassium: 4.3 mmol/L (ref 3.5–5.1)
Sodium: 140 mmol/L (ref 135–145)
Total Bilirubin: 0.7 mg/dL (ref 0.3–1.2)
Total Protein: 7.2 g/dL (ref 6.5–8.1)

## 2019-08-08 NOTE — Progress Notes (Signed)
[place] OFFICE PROGRESS NOTE  Patient Care Team: Steele Sizer, MD as PCP - General (Family Medicine) Christene Lye, MD (General Surgery) Steele Sizer, MD as Attending Physician (Family Medicine) Rico Junker, RN as Oncology Nurse Navigator Cammie Sickle, MD as Consulting Physician (Internal Medicine)  Cancer Staging No matching staging information was found for the patient.   Oncology History Overview Note  # DEC 2017- RIGHT BREAST Stage II [pT1cpN1sn; screening] ER/PR- Pos- 90%; her 2 NEU- NEG s/p Lumpect & SLNBx; Dr.Sankar ]; JAN 2018- ddAC -T x12. AUG 2nd [finished RT].  # AUG 21st 2018- Start TAM; Nov 2018- Stopped sec intol/mood swings; arthralgias];  DEC 2019- Arimidex 1 mg/day; STOPPED JAN 2020 sec to intolerarnce.   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  female pleasant patient above history of stage II breast cancer ER PR positive currently on anastrozole is in for follow-up/ ABC-be well clinical studies.   Patient quit taking anastrozole since last visit.     She noted to have significant improvement of her symptoms -fatigue /hot flashes joint pains etc.  She feels back to baseline.  She does note of shortness of breath on exertion and also wheezing with coughing.  This is chronic.  No fevers.  Review of Systems  Constitutional: Positive for malaise/fatigue. Negative for chills, diaphoresis, fever and weight loss.  HENT: Negative for nosebleeds and sore throat.   Eyes: Negative for double vision.  Respiratory: Positive for cough and  wheezing. Negative for hemoptysis and sputum production.   Cardiovascular: Negative for chest pain, palpitations, orthopnea and leg swelling.  Gastrointestinal: Negative for abdominal pain, blood in stool, constipation, diarrhea, heartburn, melena, nausea and vomiting.  Genitourinary: Negative for dysuria, frequency and urgency.  Musculoskeletal: Negative for back pain.  Skin: Negative.  Negative for itching and rash.  Neurological: Negative for dizziness, tingling, focal weakness, weakness and headaches.  Endo/Heme/Allergies: Does not bruise/bleed easily.      PAST MEDICAL HISTORY :  Past Medical History:  Diagnosis Date  . Anemia   . Anxiety   . Breast cancer ([place]) 09/2016   right breast  . Bursitis of right shoulder   . Cancer [facility])    breast  . 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 Cataract, bilateral   . Chemotherapy induced nausea and vomiting   . Cold    states she has had it for 2 months  . Depression   . GERD (gastroesophageal reflux disease)    uses baking soda for symptoms  . Insomnia   . Lumbago   . Personal history of chemotherapy 11/22/2016  . Personal history of radiation therapy 01/20/2017  . Vitamin D deficiency     PAST SURGICAL HISTORY :   Past Surgical History:  Procedure Laterality Date  . ABDOMINAL HYSTERECTOMY  04/20/2014  . BILATERAL SALPINGECTOMY    . BREAST BIOPSY Right 09/27/2016   positive  . BREAST LUMPECTOMY Right 10/12/2016   positive  . BREAST LUMPECTOMY WITH SENTINEL LYMPH NODE BIOPSY Right 10/12/2016   Procedure: BREAST LUMPECTOMY WITH  SENTINEL LYMPH NODE BX;  Surgeon: Christene Lye, MD;  Location: [facility];  Service: General;  Laterality: Right;  . COLONOSCOPY WITH PROPOFOL N/A 05/20/2016   Procedure: COLONOSCOPY WITH PROPOFOL;  Surgeon: Lucilla Lame, MD;  Location: [place];  Service: Endoscopy;  Laterality: N/A;  . ECTOPIC PREGNANCY SURGERY    .  ENDOMETRIAL ABLATION    . PORTACATH PLACEMENT Left 11/04/2016   Procedure: INSERTION PORT-A-CATH;  Surgeon: Christene Lye, MD;  Location: [facility];  Service: General;  Laterality: Left;  Left subclavian vein  . TUBAL LIGATION      FAMILY HISTORY :   Family History  Problem Relation Age of Onset  . Depression Father   . Obesity Father   . Alcohol abuse Brother   . Seizures Brother   . Breast cancer Sister 50    SOCIAL HISTORY:   Social History   Tobacco Use  . Smoking status: Former Smoker    Packs/day: 0.50    Years: 20.00    Pack years: 10.00    Types: Cigarettes    Quit date: 08/23/2016    Years since quitting: 2.9  . Smokeless tobacco: Never Used  Substance Use Topics  . Alcohol use: Yes    Alcohol/week: 0.0 standard drinks    Comment: occassional  . Drug use: No    ALLERGIES:  is allergic to adhesive [tape] and nickel.  MEDICATIONS:  Current Outpatient Medications  Medication Sig Dispense Refill  . acetaminophen (TYLENOL) 325 MG tablet Take 325 mg by mouth every 6 (six) hours as needed for fever.    Marland Kitchen albuterol (PROVENTIL HFA;VENTOLIN HFA) 108 (90 Base) MCG/ACT inhaler Inhale 2 puffs into the lungs every 4 (four) hours as needed for wheezing or shortness of breath. 1 Inhaler 1  . Investigational aspirin/placebo 300 MG tablet [facility] [id number] Take 1 tablet by mouth daily. Take with food or a full glass of water.  Do not crush enteric coated tablets. 180 tablet 0  . meloxicam (MOBIC) 15 MG tablet TAKE 1 TABLET BY MOUTH  DAILY 90 tablet 0  . Multiple Vitamin (MULTIVITAMIN WITH MINERALS) TABS tablet Take 1 tablet by mouth daily. One-A-Day    . anastrozole (ARIMIDEX) 1 MG tablet TAKE 1 TABLET BY MOUTH  DAILY (Patient not taking: Reported on 08/07/2019) 90 tablet 0  . omeprazole (PRILOSEC) 20 MG capsule TAKE 1 CAPSULE BY MOUTH  DAILY (Patient not taking: Reported on 08/07/2019) 90 capsule 0   No current facility-administered medications for this visit.      PHYSICAL EXAMINATION: ECOG PERFORMANCE STATUS: 0 - Asymptomatic  BP 122/70 (BP Location: Left Arm, Patient Position: Sitting, Cuff Size: Normal)   Pulse 62   Temp (!) 95.3 F (35.2 C) (Tympanic)   Resp 20   Ht 5' 1.5" (1.562 m)   Wt 192 lb (87.1 kg)   BMI 35.69 kg/m   Filed Weights   08/08/19 1022  Weight: 192 lb (87.1 kg)    Physical Exam  Constitutional: She is oriented to person, place, and time and well-developed, well-nourished, and in no distress.  HENT:  Head: Normocephalic and atraumatic.  Mouth/Throat: Oropharynx is clear and moist. No oropharyngeal exudate.  Eyes: Pupils are equal, round, and reactive to light.  Neck: Normal range of motion. Neck supple.  Cardiovascular: Normal rate and regular rhythm.  Pulmonary/Chest: No respiratory distress. She has no wheezes.  Wheezing scattered bilaterally.  Abdominal: Soft. Bowel sounds are normal. She exhibits no distension and no mass. There is no abdominal  tenderness. There is no rebound and no guarding.  Musculoskeletal: Normal range of motion.        General: No tenderness or edema.  Neurological: She is alert and oriented to person, place, and time.  Skin: Skin is warm.  Right and left BREAST exam (in the presence of nurse)- no unusual skin changes or dominant masses felt. Surgical scars noted.    Psychiatric: Affect normal.    LABORATORY DATA:  I have reviewed the data as listed    Component Value Date/Time   NA 140 08/08/2019 0957   NA 144 05/26/2018 1057   K 4.3 08/08/2019 0957   CL 102 08/08/2019 0957   CO2 30 08/08/2019 0957   GLUCOSE 100 (H) 08/08/2019 0957   BUN 13 08/08/2019 0957   BUN 9 05/26/2018 1057   CREATININE 0.74 08/08/2019 0957   CALCIUM 9.4 08/08/2019 0957   PROT 7.2 08/08/2019 0957   PROT 6.3 05/26/2018 1057   ALBUMIN 4.2 08/08/2019 0957   ALBUMIN 4.4 05/26/2018 1057   AST 22 08/08/2019 0957   ALT 29 08/08/2019 0957   ALKPHOS 99 08/08/2019 0957   BILITOT 0.7 08/08/2019 0957    BILITOT 0.2 05/26/2018 1057   GFRNONAA >60 08/08/2019 0957   GFRAA >60 08/08/2019 0957    No results found for: SPEP, UPEP  Lab Results  Component Value Date   WBC 6.9 08/08/2019   NEUTROABS 4.4 08/08/2019   HGB 14.0 08/08/2019   HCT 42.6 08/08/2019   MCV 86.9 08/08/2019   PLT 293 08/08/2019      Chemistry      Component Value Date/Time   NA 140 08/08/2019 0957   NA 144 05/26/2018 1057   K 4.3 08/08/2019 0957   CL 102 08/08/2019 0957   CO2 30 08/08/2019 0957   BUN 13 08/08/2019 0957   BUN 9 05/26/2018 1057   CREATININE 0.74 08/08/2019 0957      Component Value Date/Time   CALCIUM 9.4 08/08/2019 0957   ALKPHOS 99 08/08/2019 0957   AST 22 08/08/2019 0957   ALT 29 08/08/2019 0957   BILITOT 0.7 08/08/2019 0957   BILITOT 0.2 05/26/2018 1057       RADIOGRAPHIC STUDIES: I have personally reviewed the radiological images as listed and agreed with the findings in the report. No results found.   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) # Breast cancer-stage II ER/PR positive HER-2/neu negative-stable  #Patient discontinued anastrozole because of poor tolerance.  Patient is not interested in any other alternative anti-estrogen therapy.  She is not interested in engaging in any further discussion regarding this.  She understands the risk of recurrence.  #Body aches/joint pains-off AI improved.  #Continues to be in the ABC trial/also on trial involving lifestyle changes.  Labs done today.  Discussed with clinical trials nurse  #Osteopenia T score -2.1; continue calcium plus vitamin D  # Hot flashes grade 1; off AI improved.  # Discussed with the patient regarding the ill effects of smoking- counseled against smoking; patient- interested. Recommend talking to her PCP regarding smoking cessation.   #Chronic bronchitis-wheezing; discussed smoking cessation continue inhalers.  # DISPOSITION:  # Dec  2020- mammogram # follow up in 6 months-MD; no labs- Dr.B   Orders Placed This Encounter  Procedures  . US Breast Limited Uni Left Inc Axilla    Standing Status:   Future    Standing Expiration Date:   10/07/2020  Order Specific Question:   Reason for Exam (SYMPTOM  OR DIAGNOSIS REQUIRED)    Answer:   history of breast cancer    Order Specific Question:   Preferred imaging location?    Answer:   [facility]  . US Breast Limited Uni Right Inc Axilla    Standing Status:   Future    Standing Expiration Date:   10/07/2020    Order Specific Question:   Reason for Exam (SYMPTOM  OR DIAGNOSIS REQUIRED)    Answer:   history of breast cancer    Order Specific Question:   Preferred imaging location?    Answer:   [facility]  . MM DIAG BREAST TOMO BILATERAL    Standing Status:   Future    Standing Expiration Date:   08/07/2020    Order Specific Question:   Reason for Exam (SYMPTOM  OR DIAGNOSIS REQUIRED)    Answer:   history of breast cancer    Order Specific Question:   Is the patient pregnant?    Answer:   No    Order Specific Question:   Preferred imaging location?    Answer:   [facility]   All questions were answered. The patient knows to call the clinic with any problems, questions or concerns.      Cammie Sickle, MD 08/08/2019 12:45 PM

## 2019-08-08 NOTE — Assessment & Plan Note (Addendum)
#  Breast cancer-stage II ER/PR positive HER-2/neu negative-stable  #Patient discontinued anastrozole because of poor tolerance.  Patient is not interested in any other alternative anti-estrogen therapy.  She is not interested in engaging in any further discussion regarding this.  She understands the risk of recurrence.  #Body aches/joint pains-off AI improved.  #Continues to be in the ABC trial/also on trial involving lifestyle changes.  Labs done today.  Discussed with clinical trials nurse  #Osteopenia T score -2.1; continue calcium plus vitamin D  # Hot flashes grade 1; off AI improved.  # Discussed with the patient regarding the ill effects of smoking- counseled against smoking; patient- interested. Recommend talking to her PCP regarding smoking cessation.   #Chronic bronchitis-wheezing; discussed smoking cessation continue inhalers.  # DISPOSITION:  # Dec 2020- mammogram # follow up in 6 months-MD; no labs- Dr.B

## 2019-08-08 NOTE — Research (Signed)
BWEL 24 month visit. Ms. Lodes presented to the clinic this morning for her 21 month BWEL [id number] study visit. Her weight was checked twice with patient wearing light weight clothing and shoes removed. She weighed 80.2kg both times, which is about 12 lbs less  than her last clinic visit on 12/11/18 . Her waist and hip circumference measurements were obtained twice each.  Her average hip circumference was 113.0 cm and average waist circumference was 98.0 cm. Patient reports she is trying to exercise more and feels better with increased activity. She has been working 60 to 80 hours weekly recently at her job.  Dr. Rogue Bussing examined the patient and is ordering her annual mammogram in December.  She is no longer taking her Arimidex with reported symptoms resolved.  Patient also experiencing a trace of edema in BLE and has a bruise in on her left foot arch from a pedicure with massage that is healing and turning yellow now.  Patient reports some mild indigestion without emesis that she takes some baking soda for.  Solicited AE's for BWEL were assessed and patient denies experiencing any of these events.  Ms. Keitt also completed her BWEL Follow Up study Questionnaire (Version 01/23/18, Update #05) while in clinic.  She is scheduled to see Dr. Rogue Bussing in 6 months following her mammogram. Ms. Matyas verifies receiving her study materials via mail and her coach contacts her via her mobile phone.  She denies questions or concerns about the study for this visit but was encouraged to call if any problems should arise. 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  Vomiting  1 unknown 08/08/2019  unrelated  When she eats fatty foods  [name], RN, BSN, OCN 08/08/2019  1236 pm

## 2019-08-08 NOTE — Research (Signed)
24 month visit for [facility] [id number] Ms. Pike presented to the clinic this morning for her 24 month visit for ABC [id number].   She was dispensed 200 tablets of Aspirin/Placebo in on, 200 count bottle. Patient did not return her old pill bottle and was instructed to keep it since she didn't bring it back for this visit due to Covid restrictions. Her drug diaries were collected at the same time that new drug was dispensed. Based on the calendars returned and patient's report, she had only missed two doses of the Aspirin/placebo, and she states she did take occasional BC Powders and Meloxicam once every 4-5 months. Reminded patient to try to avoid taking Aspirin, Ibuprofen, Aleve or other drugs in this class while she is on study. Solicited AE's reviewed with patient and she reports some bruising on her left inner ball of her foot, which she attributes to getting a pedicure and massage that was too rough. She denies any other bleeding events. She was assessed in the clinic by Dr. Rogue Bussing today with no new complaints. She  reports not taking her Arimidex and feeling much better without any side effects now. She reports a headache from time to time, attributed to not having her coffee this am and fasting since 930 pm last night. States she continues to have reflux but not with emesis and she is no longer taking any rx for this other than baking soda.Her next mammogram will be scheduled for December this year. Patient completed her 24 month protocol questionnaire booklet. Ms. Martzall was instructed to call if she has any questions/concerns. AEs and Solicited AEs are as follows:   Study/Protocol: [id number] ABC Cycle: 18-24 months Event Grade Onset Date Resolved Date Drug Name Attribution Treatment Comments  Bruising *Solicited AE 1   Aspirin/placebo possible none Pt reports her new dog is responsible  Bil. Pedal edema 1    unlikely none   Insomnia 1 Medical history   unrelated Has Trazodone but not taking    Hot Flashes 1  08/08/2019  unrelated    Neuropathy in fingers 1  08/08/2019  unrelated none States this is from chemo  Vomitting 1  08/08/2019  unrelated  When she eats fatty foods  Indigestion 1 Medical History   Unrelated Baking Soda Has prilosec but not taking.   [name], RN, BSN, OCN 08/08/2019     1236pm

## 2019-08-24 ENCOUNTER — Ambulatory Visit: Payer: Managed Care, Other (non HMO) | Attending: Radiation Oncology | Admitting: Radiation Oncology

## 2019-09-07 ENCOUNTER — Encounter: Payer: Managed Care, Other (non HMO) | Admitting: Family Medicine

## 2019-09-07 NOTE — Progress Notes (Deleted)
Name: Shelby Barry   MRN: [id number]    DOB: Jul 16, 1966   Date:09/07/2019       Progress Note  Subjective  Chief Complaint  No chief complaint on file.   HPI  Patient presents for annual CPE. Smoking ? ***  Diet: *** Exercise: ***   USPSTF grade A and B recommendations    Office Visit from 06/14/2018 in [facility]  AUDIT-C Score  2     Depression: Phq 9 is  {Desc; negative/positive:13464::"negative"} Depression screen [facility] 2/9 06/14/2018 05/18/2018 11/09/2017 11/09/2017 09/20/2017  Decreased Interest 0 0 3 3 0  Down, Depressed, Hopeless 2 0 3 2 0  PHQ - 2 Score 2 0 6 5 0  Altered sleeping 1 - 3 - -  Tired, decreased energy 3 - 3 - -  Change in appetite 0 - 3 - -  Feeling bad or failure about yourself  1 - 3 - -  Trouble concentrating 2 - 3 - -  Moving slowly or fidgety/restless 1 - 0 - -  Suicidal thoughts 1 - 0 - -  PHQ-9 Score 11 - 21 - -  Difficult doing work/chores Somewhat difficult - Very difficult - -   Hypertension: BP Readings from Last 3 Encounters:  08/08/19 122/70  12/13/18 139/73  11/24/18 140/84   Obesity: Wt Readings from Last 3 Encounters:  08/08/19 192 lb (87.1 kg)  12/13/18 191 lb (86.6 kg)  11/24/18 188 lb (85.3 kg)   BMI Readings from Last 3 Encounters:  08/08/19 35.69 kg/m  12/13/18 34.93 kg/m  11/24/18 34.39 kg/m     Hep C Screening: negative 05/2018  STD testing and prevention (HIV/chl/gon/syphilis): *** Intimate partner violence:*** Sexual History/Pain during Intercourse: Menstrual History/LMP/Abnormal Bleeding:  Incontinence Symptoms:   Breast cancer:  - Last Mammogram: 10/2018, already ordered for 12/17 by Dr. Rogue Bussing  - BRCA gene screening: ***   Osteoporosis Screening: 10/2018 , osteopenia with low FRAX score  Cervical cancer screening: s/p hysterectomy 2015   Skin cancer: discussed atypical lesions  Colorectal cancer: repeat in 2027  Lung cancer:   Low Dose CT Chest recommended if Age 62-80  years, 30 pack-year currently smoking OR have quit w/in 15years. Patient does not qualify.   ECG: done in 2006   Advanced Care Planning: A voluntary discussion about advance care planning including the explanation and discussion of advance directives.  Discussed health care proxy and Living will, and the patient was able to identify a health care proxy as ***.  Patient {DOES_DOES TDS:28768} have a living will at present time. If patient does have living will, I have requested they bring this to the clinic to be scanned in to their chart.  Lipids: Lab Results  Component Value Date   CHOL 186 05/26/2018   Lab Results  Component Value Date   HDL 56 05/26/2018   Lab Results  Component Value Date   LDLCALC 100 (H) 05/26/2018   Lab Results  Component Value Date   TRIG 148 05/26/2018   Lab Results  Component Value Date   CHOLHDL 3.3 05/26/2018   No results found for: LDLDIRECT  Glucose: Glucose  Date Value Ref Range Status  05/26/2018 98 65 - 99 mg/dL Final   Glucose, Bld  Date Value Ref Range Status  08/08/2019 100 (H) 70 - 99 mg/dL Final  02/08/2018 102 (H) 65 - 99 mg/dL Final  05/31/2017 118 (H) 65 - 99 mg/dL Final    Patient Active Problem List   Diagnosis  Date Noted  . Obesity (BMI 30.0-34.9) 05/18/2018  . Liver lesion 11/09/2016  . Cough in adult 10/29/2016  . 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 Recurrent major depression-severe ([place]) 08/22/2015  . Bursitis of shoulder 08/22/2015  . Bilateral cataracts 08/22/2015  . Circadian rhythm sleep disorder, 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  . Vitamin D deficiency 08/22/2015  . LBP (low back pain) 08/22/2015  . GAD (generalized anxiety disorder) 08/22/2015  . History of anemia 08/17/2007  . Insomnia 08/16/2007    Past Surgical History:  Procedure Laterality Date  . ABDOMINAL HYSTERECTOMY  04/20/2014  . BILATERAL SALPINGECTOMY    . BREAST BIOPSY Right  09/27/2016   positive  . BREAST LUMPECTOMY Right 10/12/2016   positive  . BREAST LUMPECTOMY WITH SENTINEL LYMPH NODE BIOPSY Right 10/12/2016   Procedure: BREAST LUMPECTOMY WITH SENTINEL LYMPH NODE BX;  Surgeon: Christene Lye, MD;  Location: [facility];  Service: General;  Laterality: Right;  . COLONOSCOPY WITH PROPOFOL N/A 05/20/2016   Procedure: COLONOSCOPY WITH PROPOFOL;  Surgeon: Lucilla Lame, MD;  Location: [facility];  Service: Endoscopy;  Laterality: N/A;  . ECTOPIC PREGNANCY SURGERY    . ENDOMETRIAL ABLATION    . PORTACATH PLACEMENT Left 11/04/2016   Procedure: INSERTION PORT-A-CATH;  Surgeon: Christene Lye, MD;  Location: [facility];  Service: General;  Laterality: Left;  Left subclavian vein  . TUBAL LIGATION      Family History  Problem Relation Age of Onset  . Depression Father   . Obesity Father   . Alcohol abuse Brother   . Seizures Brother   . Breast cancer Sister 84    Social History   Socioeconomic History  . Marital status: Significant Other    Spouse name: John  . Number of children: 2  . Years of education: Not on file  . Highest education level: Associate degree: academic program  Occupational History  . Occupation: specimen process specialist    Employer: [employer]  Social Needs  . Financial resource strain: Not hard at all  . Food insecurity    Worry: Never true    Inability: Never true  . Transportation needs    Medical: No    Non-medical: No  Tobacco Use  . Smoking status: Former Smoker    Packs/day: 0.50    Years: 20.00    Pack years: 10.00    Types: Cigarettes    Quit date: 08/23/2016    Years since quitting: 3.0  . Smokeless tobacco: Never Used  Substance and Sexual Activity  . Alcohol use: Yes    Alcohol/week: 0.0 standard drinks    Comment: occassional  . Drug use: No  . Sexual activity: Yes    Partners: Male  Lifestyle  . Physical activity    Days per week: 3 days    Minutes per session: 20 min  . Stress: To  some extent  Relationships  . Social connections    Talks on phone: More than three times a week    Gets together: More than three times a week    Attends religious service: 1 to 4 times per year    Active member of club or organization: No    Attends meetings of clubs or organizations: Never    Relationship status: Living with partner  . Intimate partner violence    Fear of current or ex partner: No    Emotionally abused: No    Physically abused: No  Forced sexual activity: No  Other Topics Concern  . Not on file  Social History Narrative   She is living with  [name] ( boyfriend ) for the past two years, and his 71 yo daughter just moved in with them Fall 2017. Her mother is doing drugs.    Works as [employer]     Current Outpatient Medications:  .  acetaminophen (TYLENOL) 325 MG tablet, Take 325 mg by mouth every 6 (six) hours as needed for fever., Disp: , Rfl:  .  albuterol (PROVENTIL HFA;VENTOLIN HFA) 108 (90 Base) MCG/ACT inhaler, Inhale 2 puffs into the lungs every 4 (four) hours as needed for wheezing or shortness of breath., Disp: 1 Inhaler, Rfl: 1 .  anastrozole (ARIMIDEX) 1 MG tablet, TAKE 1 TABLET BY MOUTH  DAILY (Patient not taking: Reported on 08/07/2019), Disp: 90 tablet, Rfl: 0 .  Investigational aspirin/placebo 300 MG tablet Alliance [id number], Take 1 tablet by mouth daily. Take with food or a full glass of water.  Do not crush enteric coated tablets., Disp: 180 tablet, Rfl: 0 .  meloxicam (MOBIC) 15 MG tablet, TAKE 1 TABLET BY MOUTH  DAILY, Disp: 90 tablet, Rfl: 0 .  Multiple Vitamin (MULTIVITAMIN WITH MINERALS) TABS tablet, Take 1 tablet by mouth daily. One-A-Day, Disp: , Rfl:  .  omeprazole (PRILOSEC) 20 MG capsule, TAKE 1 CAPSULE BY MOUTH  DAILY (Patient not taking: Reported on 08/07/2019), Disp: 90 capsule, Rfl: 0  Allergies  Allergen Reactions  . Adhesive [Tape] Other (See Comments)    Blisters/rash  PLEASE USE PAPER TAPE ONLY  . [place]     PT CAN WEAR GOLD,  STERLING SILVER WITH NO PROBLEM-PT HAS NEVER HAD ANY PROBLEMS IN THE OR     ROS  Constitutional: Negative for fever or weight change.  Respiratory: Negative for cough and shortness of breath.   Cardiovascular: Negative for chest pain or palpitations.  Gastrointestinal: Negative for abdominal pain, no bowel changes.  Musculoskeletal: Negative for gait problem or joint swelling.  Skin: Negative for rash.  Neurological: Negative for dizziness or headache.  No other specific complaints in a complete review of systems (except as listed in HPI above).  Objective  There were no vitals filed for this visit.  There is no height or weight on file to calculate BMI.  Physical Exam  Constitutional: Patient appears well-developed and well-nourished. No distress.  HENT: Head: Normocephalic and atraumatic. Ears: B TMs ok, no erythema or effusion; Nose: Nose normal. Mouth/Throat: Oropharynx is clear and moist. No oropharyngeal exudate.  Eyes: Conjunctivae and EOM are normal. Pupils are equal, round, and reactive to light. No scleral icterus.  Neck: Normal range of motion. Neck supple. No JVD present. No thyromegaly present.  Cardiovascular: Normal rate, regular rhythm and normal heart sounds.  No murmur heard. No BLE edema. Pulmonary/Chest: Effort normal and breath sounds normal. No respiratory distress. Abdominal: Soft. Bowel sounds are normal, no distension. There is no tenderness. no masses Breast: no lumps or masses, no nipple discharge or rashes *** FEMALE GENITALIA: *** External genitalia normal External urethra normal Vaginal vault normal without discharge or lesions Cervix normal without discharge or lesions Bimanual exam normal without masses RECTAL: not done  Musculoskeletal: Normal range of motion, no joint effusions. No gross deformities Neurological: he is alert and oriented to person, place, and time. No cranial nerve deficit. Coordination, balance, strength, speech and gait are  normal.  Skin: Skin is warm and dry. No rash noted. No erythema.  Psychiatric: Patient has a  normal mood and affect. behavior is normal. Judgment and thought content normal.  Recent Results (from the past 2160 hour(s))  Comprehensive metabolic panel     Status: Abnormal   Collection Time: 08/08/19  9:57 AM  Result Value Ref Range   Sodium 140 135 - 145 mmol/L   Potassium 4.3 3.5 - 5.1 mmol/L   Chloride 102 98 - 111 mmol/L   CO2 30 22 - 32 mmol/L   Glucose, Bld 100 (H) 70 - 99 mg/dL   BUN 13 6 - 20 mg/dL   Creatinine, Ser 0.74 0.44 - 1.00 mg/dL   Calcium 9.4 8.9 - 10.3 mg/dL   Total Protein 7.2 6.5 - 8.1 g/dL   Albumin 4.2 3.5 - 5.0 g/dL   AST 22 15 - 41 U/L   ALT 29 0 - 44 U/L   Alkaline Phosphatase 99 38 - 126 U/L   Total Bilirubin 0.7 0.3 - 1.2 mg/dL   GFR calc non Af Amer >60 >60 mL/min   GFR calc Af Amer >60 >60 mL/min   Anion gap 8 5 - 15    Comment: Performed at [facility], [address] [place], [place] [address]  CBC with Differential     Status: None   Collection Time: 08/08/19  9:57 AM  Result Value Ref Range   WBC 6.9 4.0 - 10.5 K/uL   RBC 4.90 3.87 - 5.11 MIL/uL   Hemoglobin 14.0 12.0 - 15.0 g/dL   HCT 42.6 36.0 - 46.0 %   MCV 86.9 80.0 - 100.0 fL   MCH 28.6 26.0 - 34.0 pg   MCHC 32.9 30.0 - 36.0 g/dL   RDW 13.5 11.5 - 15.5 %   Platelets 293 150 - 400 K/uL   nRBC 0.0 0.0 - 0.2 %   Neutrophils Relative % 65 %   Neutro Abs 4.4 1.7 - 7.7 K/uL   Lymphocytes Relative 26 %   Lymphs Abs 1.8 0.7 - 4.0 K/uL   Monocytes Relative 6 %   Monocytes Absolute 0.4 0.1 - 1.0 K/uL   Eosinophils Relative 3 %   Eosinophils Absolute 0.2 0.0 - 0.5 K/uL   Basophils Relative 0 %   Basophils Absolute 0.0 0.0 - 0.1 K/uL   Immature Granulocytes 0 %   Abs Immature Granulocytes 0.01 0.00 - 0.07 K/uL    Comment: Performed at [facility], [address] [place], [place] [address]     Fall Risk: Fall Risk  06/14/2018 05/18/2018 11/09/2017 09/20/2017 11/02/2016   Falls in the past year? _0    ***  Functional Status Survey:   ***  Assessment & Plan  There are no diagnoses linked to this encounter.  -USPSTF grade A and B recommendations reviewed with patient; age-appropriate recommendations, preventive care, screening tests, etc discussed and encouraged; healthy living encouraged; see AVS for patient education given to patient -Discussed importance of 150 minutes of physical activity weekly, eat two servings of fish weekly, eat one serving of tree nuts ( cashews, pistachios, pecans, almonds.Marland Kitchen) every other day, eat 6 servings of fruit/vegetables daily and drink plenty of water and avoid sweet beverages.   -Reviewed Health Maintenance: ***

## 2019-10-09 ENCOUNTER — Encounter: Payer: Self-pay | Admitting: Family Medicine

## 2019-10-09 ENCOUNTER — Other Ambulatory Visit: Payer: Self-pay

## 2019-10-09 ENCOUNTER — Ambulatory Visit (INDEPENDENT_AMBULATORY_CARE_PROVIDER_SITE_OTHER): Payer: Managed Care, Other (non HMO) | Admitting: Family Medicine

## 2019-10-09 VITALS — BP 134/82 | HR 100 | Temp 96.1°F | Resp 18 | Ht 62.5 in | Wt 181.3 lb

## 2019-10-09 DIAGNOSIS — K219 Gastro-esophageal reflux disease without esophagitis: Secondary | ICD-10-CM | POA: Diagnosis not present

## 2019-10-09 DIAGNOSIS — F325 Major depressive disorder, single episode, in full remission: Secondary | ICD-10-CM

## 2019-10-09 DIAGNOSIS — M79644 Pain in right finger(s): Secondary | ICD-10-CM

## 2019-10-09 DIAGNOSIS — Z131 Encounter for screening for diabetes mellitus: Secondary | ICD-10-CM

## 2019-10-09 DIAGNOSIS — Z130 Encounter for screening for diseases of the blood and blood-forming organs and certain disorders involving the immune mechanism: Secondary | ICD-10-CM

## 2019-10-09 DIAGNOSIS — Z23 Encounter for immunization: Secondary | ICD-10-CM | POA: Diagnosis not present

## 2019-10-09 DIAGNOSIS — E559 Vitamin D deficiency, unspecified: Secondary | ICD-10-CM

## 2019-10-09 DIAGNOSIS — E669 Obesity, unspecified: Secondary | ICD-10-CM

## 2019-10-09 DIAGNOSIS — R41840 Attention and concentration deficit: Secondary | ICD-10-CM

## 2019-10-09 DIAGNOSIS — Z0000 Encounter for general adult medical examination without abnormal findings: Secondary | ICD-10-CM | POA: Diagnosis not present

## 2019-10-09 DIAGNOSIS — Z13220 Encounter for screening for lipoid disorders: Secondary | ICD-10-CM

## 2019-10-09 DIAGNOSIS — Z853 Personal history of malignant neoplasm of breast: Secondary | ICD-10-CM

## 2019-10-09 DIAGNOSIS — E66811 Obesity, class 1: Secondary | ICD-10-CM

## 2019-10-09 MED ORDER — OMEPRAZOLE 20 MG PO CPDR: 20.0000 mg | DELAYED_RELEASE_CAPSULE | Freq: Every day | ORAL | 1 refills | Status: DC

## 2019-10-09 NOTE — Progress Notes (Signed)
Name: Shelby Barry   MRN: [id number]    DOB: [birth year]   Date:10/09/2019       Progress Note  Subjective  Chief Complaint  Chief Complaint  Patient presents with  . Annual Exam    HPI  Patient presents for annual CPE and follow up  Focus problems: she thinks she has ADD, she feels overwhelmed all the time, she is always pacing, busy at home, worries about not being able to stay focused on tasks, starts multiple projects without finishing. She cried during the visit but states not depressed   History of breast cancer: still seeing hematologist, stopped anastrozole months ago, tried Tamoxifen for about 6 months but also stopped in 2019 on her own . She is up to date with mammogram  GERD: she has been out of Omeprazole and takes otc sodium bicarbonate 5 times a day to control symptoms. She states worse when eating. She states any type of food is causing symptoms now. No nausea, vomiting or blood in stools. She lost 5 lbs in the past 18 months   Chronic bronchitis: she still has a cough and sometimes sob, she quit smoking in 2017. No wheezing   Diet: trying to eat more vegetables lately  Exercise: needs to increase to 150 minutes per week.   USPSTF grade A and B recommendations    Office Visit from 10/09/2019 in [facility]  AUDIT-C Score  3     Depression: Phq 9 is  negative Depression screen [facility] 2/9 10/09/2019 06/14/2018 05/18/2018 11/09/2017 11/09/2017  Decreased Interest 0 0 0 3 3  Down, Depressed, Hopeless 0 2 0 3 2  PHQ - 2 Score 0 2 0 6 5  Altered sleeping 0 1 - 3 -  Tired, decreased energy 1 3 - 3 -  Change in appetite 0 0 - 3 -  Feeling bad or failure about yourself  0 1 - 3 -  Trouble concentrating 3 2 - 3 -  Moving slowly or fidgety/restless 0 1 - 0 -  Suicidal thoughts 0 1 - 0 -  PHQ-9 Score 4 11 - 21 -  Difficult doing work/chores Very difficult Somewhat difficult - Very difficult -   Hypertension: BP Readings from Last 3 Encounters:   10/09/19 134/82  08/08/19 122/70  12/13/18 139/73   Obesity: Wt Readings from Last 3 Encounters:  10/09/19 181 lb 4.8 oz (82.2 kg)  08/08/19 192 lb (87.1 kg)  12/13/18 191 lb (86.6 kg)   BMI Readings from Last 3 Encounters:  10/09/19 32.63 kg/m  08/08/19 35.69 kg/m  12/13/18 34.93 kg/m   Hep C Screening: negative 2019  STD testing and prevention (HIV/chl/gon/syphilis): negative in 2019 and no intercourse since 2018  Intimate partner violence: negative screen  Sexual History/Pain during Intercourse: not sexually active since cancer diagnosis except for twice, he is morbid obese over 400 lbs Menstrual History/LMP/Abnormal Bleeding: s/p hysterectomy  Incontinence Symptoms: occasionally when she coughs or sneezing   Breast cancer:  - Last Mammogram: up to date  - BRCA gene screening: previous history of breast cancer sees oncologist   Osteoporosis: discussed high calcium and vitamin D supplementation  Cervical cancer screening: not indicated   Skin cancer: discussed atypical lesions  Colorectal cancer: up to date, due for repeat in 2027  Lung cancer:   Low Dose CT Chest recommended if Age 20-80 years, 10 pack-year currently smoking OR have quit w/in 15years. Patient does not qualify.   [id number]  Advanced Care Planning: A  voluntary discussion about advance care planning including the explanation and discussion of advance directives.  Discussed health care proxy and Living will, and the patient was able to identify a health care proxy as [place] .  Patient does not have a living will at present time.   Lipids: Lab Results  Component Value Date   CHOL 186 05/26/2018   Lab Results  Component Value Date   HDL 56 05/26/2018   Lab Results  Component Value Date   LDLCALC 100 (H) 05/26/2018   Lab Results  Component Value Date   TRIG 148 05/26/2018   Lab Results  Component Value Date   CHOLHDL 3.3 05/26/2018   No results found for:  LDLDIRECT  Glucose: Glucose  Date Value Ref Range Status  05/26/2018 98 65 - 99 mg/dL Final   Glucose, Bld  Date Value Ref Range Status  08/08/2019 100 (H) 70 - 99 mg/dL Final  02/08/2018 102 (H) 65 - 99 mg/dL Final  05/31/2017 118 (H) 65 - 99 mg/dL Final    Patient Active Problem List   Diagnosis Date Noted  . Obesity (BMI 30.0-34.9) 05/18/2018  . Liver lesion 11/09/2016  . Chronic bronchitis ([place]) 10/29/2016  . 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 Recurrent major depression-severe ([place]) 08/22/2015  . Bursitis of shoulder 08/22/2015  . Bilateral cataracts 08/22/2015  . Circadian rhythm sleep disorder, 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  . Vitamin D deficiency 08/22/2015  . LBP (low back pain) 08/22/2015  . GAD (generalized anxiety disorder) 08/22/2015  . History of anemia 08/17/2007  . Insomnia 08/16/2007    Past Surgical History:  Procedure Laterality Date  . ABDOMINAL HYSTERECTOMY  04/20/2014  . BILATERAL SALPINGECTOMY    . BREAST BIOPSY Right 09/27/2016   positive  . BREAST LUMPECTOMY Right 10/12/2016   positive  . BREAST LUMPECTOMY WITH SENTINEL LYMPH NODE BIOPSY Right 10/12/2016   Procedure: BREAST LUMPECTOMY WITH SENTINEL LYMPH NODE BX;  Surgeon: Christene Lye, MD;  Location: [facility];  Service: General;  Laterality: Right;  . COLONOSCOPY WITH PROPOFOL N/A 05/20/2016   Procedure: COLONOSCOPY WITH PROPOFOL;  Surgeon: Lucilla Lame, MD;  Location: [place];  Service: Endoscopy;  Laterality: N/A;  . ECTOPIC PREGNANCY SURGERY    . ENDOMETRIAL ABLATION    . PORTACATH PLACEMENT Left 11/04/2016   Procedure: INSERTION PORT-A-CATH;  Surgeon: Christene Lye, MD;  Location: [facility];  Service: General;  Laterality: Left;  Left subclavian vein  . TUBAL LIGATION      Family History  Problem Relation Age of Onset  . Heart disease Mother   . Depression Father   . Obesity Father   .  Diabetes Father   . Alcohol abuse Father   . COPD Father   . Alcohol abuse Brother   . Seizures Brother   . Breast cancer Sister 64    Social History   Socioeconomic History  . Marital status: Significant Other    Spouse name: John  . Number of children: 2  . Years of education: Not on file  . Highest education level: Associate degree: academic program  Occupational History  . Occupation: specimen process specialist    Employer: [employer]  Social Needs  . Financial resource strain: Not hard at all  . Food insecurity    Worry: Never true    Inability: Never true  . Transportation needs    Medical: No    Non-medical: No  Tobacco Use  . Smoking status: Former Smoker    Packs/day: 0.50    Years: 20.00    Pack years: 10.00    Types: Cigarettes    Quit date: 08/23/2016    Years since quitting: 3.1  . Smokeless tobacco: Never Used  Substance and Sexual Activity  . Alcohol use: Yes    Alcohol/week: 0.0 standard drinks    Comment: occassional  . Drug use: No  . Sexual activity: Yes    Partners: Male  Lifestyle  . Physical activity    Days per week: 3 days    Minutes per session: 30 min  . Stress: To some extent  Relationships  . Social connections    Talks on phone: More than three times a week    Gets together: More than three times a week    Attends religious service: 1 to 4 times per year    Active member of club or organization: No    Attends meetings of clubs or organizations: Never    Relationship status: Living with partner  . Intimate partner violence    Fear of current or ex partner: No    Emotionally abused: No    Physically abused: No    Forced sexual activity: No  Other Topics Concern  . Not on file  Social History Narrative   She is living with  [name] ( boyfriend ) for the past two years, and his 78 yo daughter just moved in with them Fall 2017. Her mother is doing drugs.    Works as [employer]     Current Outpatient Medications:  .  acetaminophen  (TYLENOL) 325 MG tablet, Take 325 mg by mouth every 6 (six) hours as needed for fever., Disp: , Rfl:  .  albuterol (PROVENTIL HFA;VENTOLIN HFA) 108 (90 Base) MCG/ACT inhaler, Inhale 2 puffs into the lungs every 4 (four) hours as needed for wheezing or shortness of breath., Disp: 1 Inhaler, Rfl: 1 .  Investigational aspirin/placebo 300 MG tablet Alliance [id number], Take 1 tablet by mouth daily. Take with food or a full glass of water.  Do not crush enteric coated tablets., Disp: 180 tablet, Rfl: 0 .  meloxicam (MOBIC) 15 MG tablet, TAKE 1 TABLET BY MOUTH  DAILY, Disp: 90 tablet, Rfl: 0 .  Multiple Vitamin (MULTIVITAMIN WITH MINERALS) TABS tablet, Take 1 tablet by mouth daily. One-A-Day, Disp: , Rfl:  .  anastrozole (ARIMIDEX) 1 MG tablet, TAKE 1 TABLET BY MOUTH  DAILY (Patient not taking: Reported on 10/09/2019), Disp: 90 tablet, Rfl: 0 .  omeprazole (PRILOSEC) 20 MG capsule, TAKE 1 CAPSULE BY MOUTH  DAILY (Patient not taking: Reported on 10/09/2019), Disp: 90 capsule, Rfl: 0  Allergies  Allergen Reactions  . Adhesive [Tape] Other (See Comments)    Blisters/rash  PLEASE USE PAPER TAPE ONLY  . Nickel     PT CAN WEAR GOLD, STERLING SILVER WITH NO PROBLEM-PT HAS NEVER HAD ANY PROBLEMS IN THE OR     ROS  Constitutional: Negative for fever or weight change.  Respiratory: Negative for cough and shortness of breath.   Cardiovascular: Negative for chest pain or palpitations.  Gastrointestinal: Negative for abdominal pain, no bowel changes.  Musculoskeletal: Negative for gait problem or joint swelling.  Skin: Negative for rash.  Neurological: Negative for dizziness or headache.  No other specific complaints in a complete review of systems (except as listed in HPI above).  Objective  Vitals:   10/09/19 1120  BP: 134/82  Pulse: 100  Resp:  18  Temp: (!) 96.1 F (35.6 C)  TempSrc: Temporal  SpO2: 98%  Weight: 181 lb 4.8 oz (82.2 kg)  Height: 5' 2.5" (1.588 m)    Body mass index is 32.63  kg/m.  Physical Exam  Constitutional: Patient appears well-developed and obese No distress.  HENT: Head: Normocephalic and atraumatic. Ears: B TMs ok, no erythema or effusion; Nose: Nose normal. Mouth/Throat: Oropharynx is clear and moist. No oropharyngeal exudate.  Eyes: Conjunctivae and EOM are normal. Pupils are equal, round, and reactive to light. No scleral icterus.  Neck: Normal range of motion. Neck supple. No JVD present. No thyromegaly present.  Cardiovascular: Normal rate, regular rhythm and normal heart sounds.  No murmur heard. No BLE edema. Pulmonary/Chest: Effort normal and breath sounds normal. No respiratory distress. Abdominal: Soft. Bowel sounds are normal, no distension. There is no tenderness. no masses Breast: no lumps or masses, no nipple discharge or rashes FEMALE GENITALIA:  Not done RECTAL: not done Musculoskeletal: Normal range of motion, no joint effusions. No gross deformities Neurological: he is alert and oriented to person, place, and time. No cranial nerve deficit. Coordination, balance, strength, speech and gait are normal.  Skin: Skin is warm and dry. No rash noted. No erythema.  Psychiatric: Patient has a normal mood and affect. behavior is normal. Judgment and thought content normal.  Recent Results (from the past 2160 hour(s))  Comprehensive metabolic panel     Status: Abnormal   Collection Time: 08/08/19  9:57 AM  Result Value Ref Range   Sodium 140 135 - 145 mmol/L   Potassium 4.3 3.5 - 5.1 mmol/L   Chloride 102 98 - 111 mmol/L   CO2 30 22 - 32 mmol/L   Glucose, Bld 100 (H) 70 - 99 mg/dL   BUN 13 6 - 20 mg/dL   Creatinine, Ser 0.74 0.44 - 1.00 mg/dL   Calcium 9.4 8.9 - 10.3 mg/dL   Total Protein 7.2 6.5 - 8.1 g/dL   Albumin 4.2 3.5 - 5.0 g/dL   AST 22 15 - 41 U/L   ALT 29 0 - 44 U/L   Alkaline Phosphatase 99 38 - 126 U/L   Total Bilirubin 0.7 0.3 - 1.2 mg/dL   GFR calc non Af Amer >60 >60 mL/min   GFR calc Af Amer >60 >60 mL/min   Anion  gap 8 5 - 15    Comment: Performed at [facility], [address] [place], [place] [address]  CBC with Differential     Status: None   Collection Time: 08/08/19  9:57 AM  Result Value Ref Range   WBC 6.9 4.0 - 10.5 K/uL   RBC 4.90 3.87 - 5.11 MIL/uL   Hemoglobin 14.0 12.0 - 15.0 g/dL   HCT 42.6 36.0 - 46.0 %   MCV 86.9 80.0 - 100.0 fL   MCH 28.6 26.0 - 34.0 pg   MCHC 32.9 30.0 - 36.0 g/dL   RDW 13.5 11.5 - 15.5 %   Platelets 293 150 - 400 K/uL   nRBC 0.0 0.0 - 0.2 %   Neutrophils Relative % 65 %   Neutro Abs 4.4 1.7 - 7.7 K/uL   Lymphocytes Relative 26 %   Lymphs Abs 1.8 0.7 - 4.0 K/uL   Monocytes Relative 6 %   Monocytes Absolute 0.4 0.1 - 1.0 K/uL   Eosinophils Relative 3 %   Eosinophils Absolute 0.2 0.0 - 0.5 K/uL   Basophils Relative 0 %   Basophils Absolute 0.0 0.0 -  0.1 K/uL   Immature Granulocytes 0 %   Abs Immature Granulocytes 0.01 0.00 - 0.07 K/uL    Comment: Performed at [facility], [address] [place], [place] [address]     Fall Risk: Fall Risk  10/09/2019 06/14/2018 05/18/2018 11/09/2017 09/20/2017  Falls in the past year? 0 No No No No  Number falls in past yr: 0 - - - -  Injury with Fall? 0 - - - -     Functional Status Survey: Is the patient deaf or have difficulty hearing?: No Does the patient have difficulty seeing, even when wearing glasses/contacts?: No Does the patient have difficulty concentrating, remembering, or making decisions?: No Does the patient have difficulty walking or climbing stairs?: No Does the patient have difficulty dressing or bathing?: No Does the patient have difficulty doing errands alone such as visiting a doctor's office or shopping?: No   Assessment & Plan  1. Well adult exam  - Lipid panel - CBC with Differential/Platelet - Comprehensive metabolic panel - Hemoglobin A1c - VITAMIN D 25 Hydroxy (Vit-D Deficiency, Fractures) - Vitamin B12 - TSH  2. History of right breast cancer  Seeing Dr.  Ignatius Specking   3. Needs flu shot  Refused  4. Need for Tdap vaccination  - Tdap vaccine greater than or equal to 7yo IM  5. Vitamin D deficiency  - VITAMIN D 25 Hydroxy (Vit-D Deficiency, Fractures)  6. Obesity (BMI 30.0-34.9)  Discussed with the patient the risk posed by an increased BMI. Discussed importance of portion control, calorie counting and at least 150 minutes of physical activity weekly. Avoid sweet beverages and drink more water. Eat at least 6 servings of fruit and vegetables daily   7. Concentration deficit  - TSH - Ambulatory referral to Psychology  8. Screening for deficiency anemia  - CBC with Differential/Platelet  9. Diabetes mellitus screening  - Hemoglobin A1c  10. Lipid screening  - Lipid panel  11. Gastroesophageal reflux disease without esophagitis  - omeprazole (PRILOSEC) 20 MG capsule; Take 1 capsule (20 mg total) by mouth daily.  Dispense: 90 capsule; Refill: 1  12. Pain of right thumb  Takes meloxciam very seldom  13. Major depression in remission [facility])  - Ambulatory referral to Psychology   -USPSTF grade A and B recommendations reviewed with patient; age-appropriate recommendations, preventive care, screening tests, etc discussed and encouraged; healthy living encouraged; see AVS for patient education given to patient -Discussed importance of 150 minutes of physical activity weekly, eat two servings of fish weekly, eat one serving of tree nuts ( cashews, pistachios, pecans, almonds.Marland Kitchen) every other day, eat 6 servings of fruit/vegetables daily and drink plenty of water and avoid sweet beverages.

## 2019-10-09 NOTE — Patient Instructions (Signed)
She will call insurance and find out if shingrix is covered by her insurance   Preventive Care 53-53 Years Old, Female Preventive care refers to visits with your health care provider and lifestyle choices that can promote health and wellness. This includes:  A yearly physical exam. This may also be called an annual well check.  Regular dental visits and eye exams.  Immunizations.  Screening for certain conditions.  Healthy lifestyle choices, such as eating a healthy diet, getting regular exercise, not using drugs or products that contain nicotine and tobacco, and limiting alcohol use. What can I expect for my preventive care visit? Physical exam Your health care provider will check your:  Height and weight. This may be used to calculate body mass index (BMI), which tells if you are at a healthy weight.  Heart rate and blood pressure.  Skin for abnormal spots. Counseling Your health care provider may ask you questions about your:  Alcohol, tobacco, and drug use.  Emotional well-being.  Home and relationship well-being.  Sexual activity.  Eating habits.  Work and work Statistician.  Method of birth control.  Menstrual cycle.  Pregnancy history. What immunizations do I need?  Influenza (flu) vaccine  This is recommended every year. Tetanus, diphtheria, and pertussis (Tdap) vaccine  You may need a Td booster every 10 years. Varicella (chickenpox) vaccine  You may need this if you have not been vaccinated. Zoster (shingles) vaccine  You may need this after age 53. Measles, mumps, and rubella (MMR) vaccine  You may need at least one dose of MMR if you were born in 1957 or later. You may also need a second dose. Pneumococcal conjugate (PCV13) vaccine  You may need this if you have certain conditions and were not previously vaccinated. Pneumococcal polysaccharide (PPSV23) vaccine  You may need one or two doses if you smoke cigarettes or if you have certain  conditions. Meningococcal conjugate (MenACWY) vaccine  You may need this if you have certain conditions. Hepatitis A vaccine  You may need this if you have certain conditions or if you travel or work in places where you may be exposed to hepatitis A. Hepatitis B vaccine  You may need this if you have certain conditions or if you travel or work in places where you may be exposed to hepatitis B. Haemophilus influenzae type b (Hib) vaccine  You may need this if you have certain conditions. Human papillomavirus (HPV) vaccine  If recommended by your health care provider, you may need three doses over 6 months. You may receive vaccines as individual doses or as more than one vaccine together in one shot (combination vaccines). Talk with your health care provider about the risks and benefits of combination vaccines. What tests do I need? Blood tests  Lipid and cholesterol levels. These may be checked every 5 years, or more frequently if you are over 53 years old.  Hepatitis C test.  Hepatitis B test. Screening  Lung cancer screening. You may have this screening every year starting at age 53 if you have a 30-pack-year history of smoking and currently smoke or have quit within the past 15 years.  Colorectal cancer screening. All adults should have this screening starting at age 53 and continuing until age 38. Your health care provider may recommend screening at age 46 if you are at increased risk. You will have tests every 1-10 years, depending on your results and the type of screening test.  Diabetes screening. This is done by checking your  blood sugar (glucose) after you have not eaten for a while (fasting). You may have this done every 1-3 years.  Mammogram. This may be done every 1-2 years. Talk with your health care provider about when you should start having regular mammograms. This may depend on whether you have a family history of breast cancer.  BRCA-related cancer screening. This  may be done if you have a family history of breast, ovarian, tubal, or peritoneal cancers.  Pelvic exam and Pap test. This may be done every 3 years starting at age 53. Starting at age 53, this may be done every 5 years if you have a Pap test in combination with an HPV test. Other tests  Sexually transmitted disease (STD) testing.  Bone density scan. This is done to screen for osteoporosis. You may have this scan if you are at high risk for osteoporosis. Follow these instructions at home: Eating and drinking  Eat a diet that includes fresh fruits and vegetables, whole grains, lean protein, and low-fat dairy.  Take vitamin and mineral supplements as recommended by your health care provider.  Do not drink alcohol if: ? Your health care provider tells you not to drink. ? You are pregnant, may be pregnant, or are planning to become pregnant.  If you drink alcohol: ? Limit how much you have to 0-1 drink a day. ? Be aware of how much alcohol is in your drink. In the [place] one drink equals one 12 oz bottle of beer (355 mL), one 5 oz glass of wine (148 mL), or one 1 oz glass of hard liquor (44 mL). Lifestyle  Take daily care of your teeth and gums.  Stay active. Exercise for at least 30 minutes on 5 or more days each week.  Do not use any products that contain nicotine or tobacco, such as cigarettes, e-cigarettes, and chewing tobacco. If you need help quitting, ask your health care provider.  If you are sexually active, practice safe sex. Use a condom or other form of birth control (contraception) in order to prevent pregnancy and STIs (sexually transmitted infections).  If told by your health care provider, take low-dose aspirin daily starting at age 53. What's next?  Visit your health care provider once a year for a well check visit.  Ask your health care provider how often you should have your eyes and teeth checked.  Stay up to date on all vaccines. This information is not  intended to replace advice given to you by your health care provider. Make sure you discuss any questions you have with your health care provider. Document Released: 12/05/2015 Document Revised: 07/20/2018 Document Reviewed: 07/20/2018 Elsevier Patient Education  2020 [employer].

## 2019-11-08 ENCOUNTER — Ambulatory Visit
Admission: RE | Admit: 2019-11-08 | Discharge: 2019-11-08 | Disposition: A | Discharge status: Home / Self Care | Payer: Managed Care, Other (non HMO) | Source: Ambulatory Visit | Attending: Internal Medicine | Admitting: Internal Medicine

## 2019-11-08 DIAGNOSIS — Z170 Estrogen receptor positive status [ER+]: Secondary | ICD-10-CM | POA: Diagnosis present

## 2019-11-08 DIAGNOSIS — C50511 Malignant neoplasm of lower-outer quadrant of right female breast: Secondary | ICD-10-CM | POA: Diagnosis not present

## 2020-02-05 ENCOUNTER — Other Ambulatory Visit: Payer: Self-pay

## 2020-02-06 ENCOUNTER — Inpatient Hospital Stay: Payer: Managed Care, Other (non HMO) | Admitting: Internal Medicine

## 2020-02-20 ENCOUNTER — Encounter: Payer: Self-pay | Admitting: Internal Medicine

## 2020-02-20 ENCOUNTER — Inpatient Hospital Stay: Payer: Managed Care, Other (non HMO) | Attending: Internal Medicine | Admitting: Internal Medicine

## 2020-02-20 ENCOUNTER — Other Ambulatory Visit: Payer: Self-pay

## 2020-02-20 VITALS — BP 117/77 | HR 91 | Temp 97.2°F | Resp 20 | Ht 62.5 in | Wt 185.0 lb

## 2020-02-20 DIAGNOSIS — C50511 Malignant neoplasm of lower-outer quadrant of right female breast: Secondary | ICD-10-CM

## 2020-02-20 DIAGNOSIS — R0602 Shortness of breath: Secondary | ICD-10-CM | POA: Diagnosis not present

## 2020-02-20 DIAGNOSIS — Z853 Personal history of malignant neoplasm of breast: Secondary | ICD-10-CM | POA: Diagnosis present

## 2020-02-20 DIAGNOSIS — Z006 Encounter for examination for normal comparison and control in clinical research program: Secondary | ICD-10-CM | POA: Insufficient documentation

## 2020-02-20 DIAGNOSIS — Z170 Estrogen receptor positive status [ER+]: Secondary | ICD-10-CM | POA: Diagnosis not present

## 2020-02-20 DIAGNOSIS — Z803 Family history of malignant neoplasm of breast: Secondary | ICD-10-CM | POA: Insufficient documentation

## 2020-02-20 DIAGNOSIS — Z87891 Personal history of nicotine dependence: Secondary | ICD-10-CM | POA: Insufficient documentation

## 2020-02-20 DIAGNOSIS — R062 Wheezing: Secondary | ICD-10-CM | POA: Diagnosis not present

## 2020-02-20 DIAGNOSIS — R05 Cough: Secondary | ICD-10-CM | POA: Insufficient documentation

## 2020-02-20 DIAGNOSIS — I890 Lymphedema, not elsewhere classified: Secondary | ICD-10-CM

## 2020-02-20 NOTE — Research (Addendum)
BWEL [id number] 30 month protocol visit:   Ms. Shelby Barry presented to the clinic this morning for her 30 month BWEL [id number] study visit. Her weight was checked twice with patient wearing light weight clothing and shoes removed. She weighed 185.6 lbs and 185.4 lbs consecutively. Her waist and hip circumference measurements were obtained twice each.  Her average hip circumference was 106 cm and average waist circumference was 108 cm. Patient reports mild right lymphedema and Dr. Rogue Bussing has referred her to the lymphedema clinic for evaluation.  Dr. Rogue Bussing examined the patient and is ordering her annual mammogram in December.  She is no longer taking her Arimidex with reported symptoms resolved.  Patient also experiencing trace of edema in bilateral lower extremities remains the same, she denies any fractures, bruising, bleeding, muscle issues. Solicited AE's for BWEL were assessed and patient denies experiencing any of these events.  [name] also completed her BWEL Follow Up study Questionnaire (Version 06/29/2019, Update #07) while in clinic.   She is scheduled to see Dr. Rogue Bussing in 6 months for protocol visit. Ms. Zerr verifies receiving her study materials via mail and her coach contacts her via her mobile phone.  She denies questions or concerns about the study for this visit but was encouraged to call if any problems should arise. 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  Mild right lymphedema 1 Reported 02/20/2020   Unrelated Physical therapy   Vomiting    1 unknown 08/08/2019  unrelated  When she eats fatty foods  [name], RN, BSN, OCN 02/20/2020 1145 am

## 2020-02-20 NOTE — Assessment & Plan Note (Addendum)
#  Breast cancer-stage II ER/PR positive HER-2/neu negative-OFF; enodcrine therapay-  STABLE. Mammogram 2020-WNL.   #Patient discontinued anastrozole because of poor tolerance.  Patient is not interested in any other alternative anti-estrogen therapy.  She is not interested in engaging in any further discussion regarding this.  She understands the risk of recurrence.  # right arm sweling- refer to [facility]   #Continues to be in the ABC trial/also on trial involving lifestyle changes.  Labs done today.  Discussed with clinical trials nurse  #Osteopenia T score -2.1; continue calcium plus vitamin   # Discussed with the patient regarding the ill effects of smoking- counseled against smoking; patient- interested. Recommend talking to her PCP regarding smoking cessation.   # DISPOSITION: # Lymphedema PT referral # follow up in 6 months-MD; no labs- Dr.B

## 2020-02-20 NOTE — Progress Notes (Signed)
[facility] OFFICE PROGRESS NOTE  Patient Care Team: Steele Sizer, MD as PCP - General (Family Medicine) Christene Lye, MD (General Surgery) Steele Sizer, MD as Attending Physician (Family Medicine) Rico Junker, RN as Oncology Nurse Navigator Cammie Sickle, MD as Consulting Physician (Internal Medicine)  Cancer Staging No matching staging information was found for the patient.   Oncology History Overview Note  # DEC 2017- RIGHT BREAST Stage II [pT1cpN1sn; screening] ER/PR- Pos- 90%; her 2 NEU- NEG s/p Lumpect & SLNBx; Dr.Sankar ]; JAN 2018- ddAC -T x12. AUG 2nd [finished RT].  # AUG 21st 2018- Start TAM; Nov 2018- Stopped sec intol/mood swings; arthralgias];  DEC 2019- Arimidex 1 mg/day; STOPPED JAN 2020 sec to intolerarnce.   ---------------------------------------------------------  # MRI liver- hemagiomas  # Genetic counseling- DICER-1 VUS**  # TAH & BSO [Dr.Hall; [facility]; 2014]; smoking   DIAGNOSIS: BREAST CA  STAGE:   II      ;GOALS: cure  CURRENT/MOST RECENT THERAPY: surveillaince    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, estrogen receptor positive (Shelby Barry)      INTERVAL HISTORY:  Shelby Barry 54 y.o.  female pleasant patient above history of stage II breast cancer ER PR positive currently on anastrozole is in for follow-up/ ABC-be well clinical studies.   Patient quit taking anastrozole since last visit.     She noted to have significant improvement of her symptoms -fatigue /hot flashes joint pains etc.  She feels back to baseline.  She does note of shortness of breath on exertion and also wheezing with coughing.  This is chronic.  No fevers.  Review of Systems  Constitutional: Positive for malaise/fatigue. Negative for chills, diaphoresis, fever and weight loss.  HENT: Negative for nosebleeds and sore throat.   Eyes: Negative for double vision.  Respiratory: Positive for cough and  wheezing. Negative for hemoptysis and sputum production.   Cardiovascular: Negative for chest pain, palpitations, orthopnea and leg swelling.  Gastrointestinal: Negative for abdominal pain, blood in stool, constipation, diarrhea, heartburn, melena, nausea and vomiting.  Genitourinary: Negative for dysuria, frequency and urgency.  Musculoskeletal: Negative for back pain.  Skin: Negative.  Negative for itching and rash.  Neurological: Negative for dizziness, tingling, focal weakness, weakness and headaches.  Endo/Heme/Allergies: Does not bruise/bleed easily.      PAST MEDICAL HISTORY :  Past Medical History:  Diagnosis Date  . Anemia   . Anxiety   . Breast cancer ([place]) 09/2016   right breast  . Bursitis of right shoulder   . Cancer [facility])    breast  . 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, estrogen receptor positive ([place])   . Cataract, bilateral   . Chemotherapy induced nausea and vomiting   . Cold    states she has had it for 2 months  . Depression   . GERD (gastroesophageal reflux disease)    uses baking soda for symptoms  . Insomnia   . Lumbago   . Personal history of chemotherapy 11/22/2016  . Personal history of radiation therapy 01/20/2017  . Vitamin D deficiency     PAST SURGICAL HISTORY :   Past Surgical History:  Procedure Laterality Date  . ABDOMINAL HYSTERECTOMY  04/20/2014  . BILATERAL SALPINGECTOMY    . BREAST BIOPSY Right 09/27/2016   positive  . BREAST LUMPECTOMY Right 10/12/2016   positive  . BREAST LUMPECTOMY WITH SENTINEL LYMPH NODE BIOPSY Right 10/12/2016   Procedure: BREAST LUMPECTOMY WITH  SENTINEL LYMPH NODE BX;  Surgeon: Christene Lye, MD;  Location: [facility];  Service: General;  Laterality: Right;  . COLONOSCOPY WITH PROPOFOL N/A 05/20/2016   Procedure: COLONOSCOPY WITH PROPOFOL;  Surgeon: Lucilla Lame, MD;  Location: [facility];  Service: Endoscopy;  Laterality: N/A;  . ECTOPIC PREGNANCY SURGERY    .  ENDOMETRIAL ABLATION    . PORTACATH PLACEMENT Left 11/04/2016   Procedure: INSERTION PORT-A-CATH;  Surgeon: Christene Lye, MD;  Location: [facility] OR[facility];  Service: General;  Laterality: Left;  Left subclavian vein  . TUBAL LIGATION      FAMILY HISTORY :   Family History  Problem Relation Age of Onset  . Heart disease Mother   . Depression Father   . Obesity Father   . Diabetes Father   . Alcohol abuse Father   . COPD Father   . Alcohol abuse Brother   . Seizures Brother   . Breast cancer Sister 62    SOCIAL HISTORY:   Social History   Tobacco Use  . Smoking status: Former Smoker    Packs/day: 0.50    Years: 20.00    Pack years: 10.00    Types: Cigarettes    Quit date: 08/23/2016    Years since quitting: 3.4  . Smokeless tobacco: Never Used  Substance Use Topics  . Alcohol use: Yes    Alcohol/week: 0.0 standard drinks    Comment: occassional  . Drug use: No    ALLERGIES:  is allergic to adhesive [tape] and nickel.  MEDICATIONS:  Current Outpatient Medications  Medication Sig Dispense Refill  . acetaminophen (TYLENOL) 325 MG tablet Take 325 mg by mouth every 6 (six) hours as needed for fever.    . Investigational aspirin/placebo 300 MG tablet Alliance [id number] Take 1 tablet by mouth daily. Take with food or a full glass of water.  Do not crush enteric coated tablets. 180 tablet 0  . meloxicam (MOBIC) 15 MG tablet TAKE 1 TABLET BY MOUTH  DAILY 90 tablet 0  . Multiple Vitamin (MULTIVITAMIN WITH MINERALS) TABS tablet Take 1 tablet by mouth daily. One-A-Day    . omeprazole (PRILOSEC) 20 MG capsule Take 1 capsule (20 mg total) by mouth daily. 90 capsule 1   No current facility-administered medications for this visit.    PHYSICAL EXAMINATION: ECOG PERFORMANCE STATUS: 0 - Asymptomatic  There were no vitals taken for this visit.  There were no vitals filed for this visit.  Physical Exam  Constitutional: She is oriented to person, place, and time and well-developed,  well-nourished, and in no distress.  HENT:  Head: Normocephalic and atraumatic.  Mouth/Throat: Oropharynx is clear and moist. No oropharyngeal exudate.  Eyes: Pupils are equal, round, and reactive to light.  Cardiovascular: Normal rate and regular rhythm.  Pulmonary/Chest: No respiratory distress. She has no wheezes.  Wheezing scattered bilaterally.  Abdominal: Soft. Bowel sounds are normal. She exhibits no distension and no mass. There is no abdominal tenderness. There is no rebound and no guarding.  Musculoskeletal:        General: No tenderness or edema. Normal range of motion.     Cervical back: Normal range of motion and neck supple.  Neurological: She is alert and oriented to person, place, and time.  Skin: Skin is warm.  Right and left BREAST exam (in the presence of nurse)- no unusual skin changes or dominant masses felt. Surgical scars noted.    Psychiatric: Affect normal.    LABORATORY DATA:  I have reviewed  the data as listed    Component Value Date/Time   NA 140 08/08/2019 0957   NA 144 05/26/2018 1057   K 4.3 08/08/2019 0957   CL 102 08/08/2019 0957   CO2 30 08/08/2019 0957   GLUCOSE 100 (H) 08/08/2019 0957   BUN 13 08/08/2019 0957   BUN 9 05/26/2018 1057   CREATININE 0.74 08/08/2019 0957   CALCIUM 9.4 08/08/2019 0957   PROT 7.2 08/08/2019 0957   PROT 6.3 05/26/2018 1057   ALBUMIN 4.2 08/08/2019 0957   ALBUMIN 4.4 05/26/2018 1057   AST 22 08/08/2019 0957   ALT 29 08/08/2019 0957   ALKPHOS 99 08/08/2019 0957   BILITOT 0.7 08/08/2019 0957   BILITOT 0.2 05/26/2018 1057   GFRNONAA >60 08/08/2019 0957   GFRAA >60 08/08/2019 0957    No results found for: SPEP, UPEP  Lab Results  Component Value Date   WBC 6.9 08/08/2019   NEUTROABS 4.4 08/08/2019   HGB 14.0 08/08/2019   HCT 42.6 08/08/2019   MCV 86.9 08/08/2019   PLT 293 08/08/2019      Chemistry      Component Value Date/Time   NA 140 08/08/2019 0957   NA 144 05/26/2018 1057   K 4.3 08/08/2019  0957   CL 102 08/08/2019 0957   CO2 30 08/08/2019 0957   BUN 13 08/08/2019 0957   BUN 9 05/26/2018 1057   CREATININE 0.74 08/08/2019 0957      Component Value Date/Time   CALCIUM 9.4 08/08/2019 0957   ALKPHOS 99 08/08/2019 0957   AST 22 08/08/2019 0957   ALT 29 08/08/2019 0957   BILITOT 0.7 08/08/2019 0957   BILITOT 0.2 05/26/2018 1057       RADIOGRAPHIC STUDIES: I have personally reviewed the radiological images as listed and agreed with the findings in the report. No results found.   ASSESSMENT & PLAN:  No problem-specific Assessment & Plan notes found for this encounter.   No orders of the defined types were placed in this encounter.  All questions were answered. The patient knows to call the clinic with any problems, questions or concerns.      Cammie Sickle, MD 02/20/2020 8:41 AM

## 2020-02-20 NOTE — Progress Notes (Signed)
Research RN called patient to remind her about her appointment today with Dr. Rogue Bussing. I reminded the patient to bring her ABC pill bottles and medication diaries with her to her appointment today for IP exchange. She was thankful for the reminder call, time verified for the appointment, she states she will be here today. [name], RN, BSN, OCN 02/20/2020 0840 am

## 2020-02-20 NOTE — Research (Signed)
[id number] BWEL Protocol Re-consent Visit:  Patient in to cancer center this am for scheduled protocol visit with Dr. Rogue Bussing. Consent / [place] for [id number] BWEL version date 06/29/2019 presented to the patient at this time in it's entirety. Specific explanation of the protocol treatment, alternatives, potential risks, side effects, and potential benefits were reviewed in detail. The patient understands all the changes in the protocol for which re-consent is necessary. The patient did agree to participate in the optional Body Composition Sub-study. The patients questions were answered to her reported satisfaction at this time. A copy of the signed / dated informed consent were provided to the patient for her records.   [name], RN, BSN, OCN 02/20/2020 1145 am

## 2020-02-23 LAB — HEMOGLOBIN A1C
Est. average glucose Bld gHb Est-mCnc: 105 mg/dL
Hgb A1c MFr Bld: 5.3 % (ref 4.8–5.6)

## 2020-02-23 LAB — CBC WITH DIFFERENTIAL/PLATELET
Basophils Absolute: 0.1 10*3/uL (ref 0.0–0.2)
Basos: 1 %
EOS (ABSOLUTE): 0.3 10*3/uL (ref 0.0–0.4)
Eos: 4 %
Hematocrit: 41.8 % (ref 34.0–46.6)
Hemoglobin: 13.8 g/dL (ref 11.1–15.9)
Immature Grans (Abs): 0 10*3/uL (ref 0.0–0.1)
Immature Granulocytes: 0 %
Lymphocytes Absolute: 2.1 10*3/uL (ref 0.7–3.1)
Lymphs: 31 %
MCH: 28.2 pg (ref 26.6–33.0)
MCHC: 33 g/dL (ref 31.5–35.7)
MCV: 86 fL (ref 79–97)
Monocytes Absolute: 0.4 10*3/uL (ref 0.1–0.9)
Monocytes: 6 %
Neutrophils Absolute: 4 10*3/uL (ref 1.4–7.0)
Neutrophils: 58 %
Platelets: 301 10*3/uL (ref 150–450)
RBC: 4.89 x10E6/uL (ref 3.77–5.28)
RDW: 12.6 % (ref 11.7–15.4)
WBC: 6.8 10*3/uL (ref 3.4–10.8)

## 2020-02-23 LAB — COMPREHENSIVE METABOLIC PANEL
ALT: 23 IU/L (ref 0–32)
AST: 22 IU/L (ref 0–40)
Albumin/Globulin Ratio: 2.3 — ABNORMAL HIGH (ref 1.2–2.2)
Albumin: 4.3 g/dL (ref 3.8–4.9)
Alkaline Phosphatase: 107 IU/L (ref 39–117)
BUN/Creatinine Ratio: 16 (ref 9–23)
BUN: 12 mg/dL (ref 6–24)
Bilirubin Total: 0.3 mg/dL (ref 0.0–1.2)
CO2: 23 mmol/L (ref 20–29)
Calcium: 9.6 mg/dL (ref 8.7–10.2)
Chloride: 101 mmol/L (ref 96–106)
Creatinine, Ser: 0.73 mg/dL (ref 0.57–1.00)
GFR calc Af Amer: 108 mL/min/{1.73_m2} (ref >59)
GFR calc non Af Amer: 94 mL/min/{1.73_m2} (ref >59)
Globulin, Total: 1.9 g/dL (ref 1.5–4.5)
Glucose: 93 mg/dL (ref 65–99)
Potassium: 4.4 mmol/L (ref 3.5–5.2)
Sodium: 141 mmol/L (ref 134–144)
Total Protein: 6.2 g/dL (ref 6.0–8.5)

## 2020-02-23 LAB — LIPID PANEL
Chol/HDL Ratio: 3.1 ratio (ref 0.0–4.4)
Cholesterol, Total: 185 mg/dL (ref 100–199)
HDL: 59 mg/dL (ref >39)
LDL Chol Calc (NIH): 106 mg/dL — ABNORMAL HIGH (ref 0–99)
Triglycerides: 112 mg/dL (ref 0–149)
VLDL Cholesterol Cal: 20 mg/dL (ref 5–40)

## 2020-02-23 LAB — VITAMIN D 25 HYDROXY (VIT D DEFICIENCY, FRACTURES): Vit D, 25-Hydroxy: 31.4 ng/mL (ref 30.0–100.0)

## 2020-02-23 LAB — TSH: TSH: 2.76 u[IU]/mL (ref 0.450–4.500)

## 2020-02-23 LAB — VITAMIN B12: Vitamin B-12: 852 pg/mL (ref 232–1245)

## 2020-02-27 ENCOUNTER — Other Ambulatory Visit: Payer: Self-pay

## 2020-02-27 ENCOUNTER — Ambulatory Visit (INDEPENDENT_AMBULATORY_CARE_PROVIDER_SITE_OTHER): Payer: Managed Care, Other (non HMO) | Admitting: Family Medicine

## 2020-02-27 ENCOUNTER — Encounter: Payer: Self-pay | Admitting: Family Medicine

## 2020-02-27 VITALS — BP 126/68 | HR 93 | Temp 97.3°F | Resp 16 | Ht 62.5 in | Wt 186.9 lb

## 2020-02-27 DIAGNOSIS — E559 Vitamin D deficiency, unspecified: Secondary | ICD-10-CM

## 2020-02-27 DIAGNOSIS — K219 Gastro-esophageal reflux disease without esophagitis: Secondary | ICD-10-CM | POA: Diagnosis not present

## 2020-02-27 DIAGNOSIS — J4489 Other specified chronic obstructive pulmonary disease: Secondary | ICD-10-CM

## 2020-02-27 DIAGNOSIS — F902 Attention-deficit hyperactivity disorder, combined type: Secondary | ICD-10-CM

## 2020-02-27 DIAGNOSIS — E669 Obesity, unspecified: Secondary | ICD-10-CM | POA: Diagnosis not present

## 2020-02-27 DIAGNOSIS — F339 Major depressive disorder, recurrent, unspecified: Secondary | ICD-10-CM

## 2020-02-27 DIAGNOSIS — J449 Chronic obstructive pulmonary disease, unspecified: Secondary | ICD-10-CM

## 2020-02-27 DIAGNOSIS — E66811 Obesity, class 1: Secondary | ICD-10-CM

## 2020-02-27 MED ORDER — LORATADINE 10 MG PO TABS: 10.0000 mg | ORAL_TABLET | Freq: Every day | ORAL | 2 refills | Status: DC

## 2020-02-27 MED ORDER — ALBUTEROL SULFATE HFA 108 (90 BASE) MCG/ACT IN AERS: 2.0000 | INHALATION_SPRAY | Freq: Four times a day (QID) | RESPIRATORY_TRACT | 0 refills | Status: DC | PRN

## 2020-02-27 MED ORDER — BREO ELLIPTA 100-25 MCG/INH IN AEPB: 1.0000 | INHALATION_SPRAY | Freq: Every day | RESPIRATORY_TRACT | 2 refills | Status: DC

## 2020-02-27 MED ORDER — AMPHETAMINE-DEXTROAMPHET ER 15 MG PO CP24: 15.0000 mg | ORAL_CAPSULE | ORAL | 0 refills | Status: DC

## 2020-02-27 NOTE — Progress Notes (Addendum)
Name: Shelby Barry   MRN: [id number]    DOB: Jan 20, 1966   Date:02/27/2020       Progress Note  Subjective  Chief Complaint  Chief Complaint  Patient presents with  . Medication Refill  . Depression  . Gastroesophageal Reflux    Well controlled  . Focus problems    Seen [facility] and brought paperwork from Dr. Araceli Bouche evaluation  . Bronchitis    HPI  ADD combined type: she told me she always felt that she had ADD so we referred her for formal testing , she saw [facility] in [place] and the diagnosis was positive for ADD She feels overwhelmed all the time, she is always pacing, busy at home, worries about not being able to stay focused on tasks, starts multiple projects without finishing. Depression screen was ambiguus. Discussed multiple options and classes of medication. She would like to try Adderal first. Discussed possible side effects of medication.    History of breast cancer: still seeing hematologist, stopped anastrozole months ago, tried Tamoxifen for about 6 months but also stopped in 2019 on her own . She is up to date with mammogram, last visit with Dr. Burlene Arnt recently   GERD: she has been taking Omeprazole daily, she states she has heartburn and epigastric pain if she skips one dose of medication. Discussed possible side effects Symptoms were less severe when she had lost weight.   Chronic bronchitis/asthma: she still has a cough and sometimes sob, she quit smoking in 2017. No wheezing. She states that she also has allergies and she coughs more this time of the year. She worked in her yard yesterday and has noticed increase in cough today some phlegm this am, no fever or chills . She is not ready to get COVID-19 vaccine  Patient Active Problem List   Diagnosis Date Noted  . Obesity (BMI 30.0-34.9) 05/18/2018  . Liver lesion 11/09/2016  . Chronic bronchitis ([place]) 10/29/2016  . Carcinoma of lower-outer quadrant of right breast in  female, estrogen receptor positive ([place]) 10/26/2016  . Recurrent major depression-severe ([place]) 08/22/2015  . Bursitis of shoulder 08/22/2015  . Bilateral cataracts 08/22/2015  . Circadian rhythm sleep disorder, 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  . Vitamin D deficiency 08/22/2015  . LBP (low back pain) 08/22/2015  . GAD (generalized anxiety disorder) 08/22/2015  . History of anemia 08/17/2007  . Insomnia 08/16/2007    Past Surgical History:  Procedure Laterality Date  . ABDOMINAL HYSTERECTOMY  04/20/2014  . BILATERAL SALPINGECTOMY    . BREAST BIOPSY Right 09/27/2016   positive  . BREAST LUMPECTOMY Right 10/12/2016   positive  . BREAST LUMPECTOMY WITH SENTINEL LYMPH NODE BIOPSY Right 10/12/2016   Procedure: BREAST LUMPECTOMY WITH SENTINEL LYMPH NODE BX;  Surgeon: Christene Lye, MD;  Location: [facility];  Service: General;  Laterality: Right;  . COLONOSCOPY WITH PROPOFOL N/A 05/20/2016   Procedure: COLONOSCOPY WITH PROPOFOL;  Surgeon: Lucilla Lame, MD;  Location: [facility];  Service: Endoscopy;  Laterality: N/A;  . ECTOPIC PREGNANCY SURGERY    . ENDOMETRIAL ABLATION    . PORTACATH PLACEMENT Left 11/04/2016   Procedure: INSERTION PORT-A-CATH;  Surgeon: Christene Lye, MD;  Location: [facility];  Service: General;  Laterality: Left;  Left subclavian vein  . TUBAL LIGATION      Family History  Problem Relation Age of Onset  . Heart disease Mother   . Depression Father   . Obesity Father   . Diabetes Father   .  Alcohol abuse Father   . COPD Father   . Alcohol abuse Brother   . Seizures Brother   . Breast cancer Sister 64    Social History   Tobacco Use  . Smoking status: Former Smoker    Packs/day: 0.50    Years: 20.00    Pack years: 10.00    Types: Cigarettes    Quit date: 08/23/2016    Years since quitting: 3.5  . Smokeless tobacco: Never Used  Substance Use Topics  . Alcohol use: Yes    Alcohol/week: 0.0 standard drinks    Comment: occassional      Current Outpatient Medications:  .  acetaminophen (TYLENOL) 325 MG tablet, Take 325 mg by mouth every 6 (six) hours as needed for fever., Disp: , Rfl:  .  Investigational aspirin/placebo 300 MG tablet Alliance [id number], Take 1 tablet by mouth daily. Take with food or a full glass of water.  Do not crush enteric coated tablets., Disp: 180 tablet, Rfl: 0 .  meloxicam (MOBIC) 15 MG tablet, TAKE 1 TABLET BY MOUTH  DAILY, Disp: 90 tablet, Rfl: 0 .  Multiple Vitamin (MULTIVITAMIN WITH MINERALS) TABS tablet, Take 1 tablet by mouth daily. One-A-Day, Disp: , Rfl:  .  omeprazole (PRILOSEC) 20 MG capsule, Take 1 capsule (20 mg total) by mouth daily., Disp: 90 capsule, Rfl: 1  Allergies  Allergen Reactions  . Adhesive [Tape] Other (See Comments)    Blisters/rash  PLEASE USE PAPER TAPE ONLY  . Nickel     PT CAN WEAR GOLD, STERLING SILVER WITH NO PROBLEM-PT HAS NEVER HAD ANY PROBLEMS IN THE OR    I personally reviewed active problem list, medication list, allergies, family history, social history, health maintenance with the patient/caregiver today.   ROS  Constitutional: Negative for fever or weight change.  Respiratory: Negative for cough and shortness of breath.   Cardiovascular: Negative for chest pain or palpitations.  Gastrointestinal: Negative for abdominal pain, no bowel changes.  Musculoskeletal: Negative for gait problem or joint swelling.  Skin: Negative for rash.  Neurological: Negative for dizziness or headache.  No other specific complaints in a complete review of systems (except as listed in HPI above).  Objective  Vitals:   02/27/20 1139  BP: 126/68  Pulse: 93  Resp: 16  Temp: (!) 97.3 F (36.3 C)  TempSrc: Temporal  SpO2: 96%  Weight: 186 lb 14.4 oz (84.8 kg)  Height: 5' 2.5" (1.588 m)    Body mass index is 33.64 kg/m.  Physical Exam  Constitutional: Patient appears well-developed and well-nourished. Obese No distress.  HEENT: head atraumatic,  normocephalic, pupils equal and reactive to light, Cardiovascular: Normal rate, regular rhythm and normal heart sounds.  No murmur heard. No BLE edema. Pulmonary/Chest: Effort normal , in and expiratory wheezing  No respiratory distress. Abdominal: Soft.  There is no tenderness. Psychiatric: Patient has a normal mood and affect. behavior is normal. Judgment and thought content normal.  Recent Results (from the past 2160 hour(s))  Lipid panel     Status: Abnormal   Collection Time: 02/22/20  1:31 PM  Result Value Ref Range   Cholesterol, Total 185 100 - 199 mg/dL   Triglycerides 112 0 - 149 mg/dL   HDL 59 >39 mg/dL   VLDL Cholesterol Cal 20 5 - 40 mg/dL   LDL Chol Calc (NIH) 106 (H) 0 - 99 mg/dL   Chol/HDL Ratio 3.1 0.0 - 4.4 ratio    Comment:  T. Chol/HDL Ratio                                             Men  Women                               1/2 Avg.Risk  3.4    3.3                                   Avg.Risk  5.0    4.4                                2X Avg.Risk  9.6    7.1                                3X Avg.Risk 23.4   11.0   CBC with Differential/Platelet     Status: None   Collection Time: 02/22/20  1:31 PM  Result Value Ref Range   WBC 6.8 3.4 - 10.8 x10E3/uL   RBC 4.89 3.77 - 5.28 x10E6/uL   Hemoglobin 13.8 11.1 - 15.9 g/dL   Hematocrit 41.8 34.0 - 46.6 %   MCV 86 79 - 97 fL   MCH 28.2 26.6 - 33.0 pg   MCHC 33.0 31.5 - 35.7 g/dL   RDW 12.6 11.7 - 15.4 %   Platelets 301 150 - 450 x10E3/uL   Neutrophils 58 Not Estab. %   Lymphs 31 Not Estab. %   Monocytes 6 Not Estab. %   Eos 4 Not Estab. %   Basos 1 Not Estab. %   Neutrophils Absolute 4.0 1.4 - 7.0 x10E3/uL   Lymphocytes Absolute 2.1 0.7 - 3.1 x10E3/uL   Monocytes Absolute 0.4 0.1 - 0.9 x10E3/uL   EOS (ABSOLUTE) 0.3 0.0 - 0.4 x10E3/uL   Basophils Absolute 0.1 0.0 - 0.2 x10E3/uL   Immature Granulocytes 0 Not Estab. %   Immature Grans (Abs) 0.0 0.0 - 0.1 x10E3/uL  Comprehensive  metabolic panel     Status: Abnormal   Collection Time: 02/22/20  1:31 PM  Result Value Ref Range   Glucose 93 65 - 99 mg/dL   BUN 12 6 - 24 mg/dL   Creatinine, Ser 0.73 0.57 - 1.00 mg/dL   GFR calc non Af Amer 94 >59 mL/min/1.73   GFR calc Af Amer 108 >59 mL/min/1.73   BUN/Creatinine Ratio 16 9 - 23   Sodium 141 134 - 144 mmol/L   Potassium 4.4 3.5 - 5.2 mmol/L   Chloride 101 96 - 106 mmol/L   CO2 23 20 - 29 mmol/L   Calcium 9.6 8.7 - 10.2 mg/dL   Total Protein 6.2 6.0 - 8.5 g/dL   Albumin 4.3 3.8 - 4.9 g/dL   Globulin, Total 1.9 1.5 - 4.5 g/dL   Albumin/Globulin Ratio 2.3 (H) 1.2 - 2.2   Bilirubin Total 0.3 0.0 - 1.2 mg/dL   Alkaline Phosphatase 107 39 - 117 IU/L   AST 22 0 - 40 IU/L   ALT 23 0 - 32 IU/L  Hemoglobin A1c     Status: None   Collection Time: 02/22/20  1:31  PM  Result Value Ref Range   Hgb A1c MFr Bld 5.3 4.8 - 5.6 %    Comment:          Prediabetes: 5.7 - 6.4          Diabetes: >6.4          Glycemic control for adults with diabetes: <7.0    Est. average glucose Bld gHb Est-mCnc 105 mg/dL  VITAMIN D 25 Hydroxy (Vit-D Deficiency, Fractures)     Status: None   Collection Time: 02/22/20  1:31 PM  Result Value Ref Range   Vit D, 25-Hydroxy 31.4 30.0 - 100.0 ng/mL    Comment: Vitamin D deficiency has been defined by the [place] and an Endocrine Society practice guideline as a level of serum 25-OH vitamin D less than 20 ng/mL (1,2). The Endocrine Society went on to further define vitamin D insufficiency as a level between 21 and 29 ng/mL (2). 1. IOM (Institute of Medicine). 2010. Dietary reference    intakes for calcium and D. [place]: The    [employer]. 2. Holick MF, Binkley [place], Bischoff-Ferrari HA, et al.    Evaluation, treatment, and prevention of vitamin D    deficiency: an Endocrine Society clinical practice    guideline. JCEM. 2011 Jul; 96(7):1911-30.   Vitamin B12     Status: None   Collection Time: 02/22/20  1:31 PM   Result Value Ref Range   Vitamin B-12 852 232 - 1,245 pg/mL  TSH     Status: None   Collection Time: 02/22/20  1:31 PM  Result Value Ref Range   TSH 2.760 0.450 - 4.500 uIU/mL      PHQ2/9: Depression screen [facility] 2/9 02/27/2020 10/09/2019 06/14/2018 05/18/2018 11/09/2017  Decreased Interest 1 0 0 0 3  Down, Depressed, Hopeless 0 0 2 0 3  PHQ - 2 Score 1 0 2 0 6  Altered sleeping 2 0 1 - 3  Tired, decreased energy 1 1 3  - 3  Change in appetite 0 0 0 - 3  Feeling bad or failure about yourself  0 0 1 - 3  Trouble concentrating 3 3 2  - 3  Moving slowly or fidgety/restless 0 0 1 - 0  Suicidal thoughts 0 0 1 - 0  PHQ-9 Score 7 4 11  - 21  Difficult doing work/chores Not difficult at all Very difficult Somewhat difficult - Very difficult    phq 9 is positive   Fall Risk: Fall Risk  02/27/2020 10/09/2019 06/14/2018 05/18/2018 11/09/2017  Falls in the past year? 0 0 No No No  Number falls in past yr: 0 0 - - -  Injury with Fall? 0 0 - - -      Functional Status Survey: Is the patient deaf or have difficulty hearing?: No Does the patient have difficulty seeing, even when wearing glasses/contacts?: No Does the patient have difficulty concentrating, remembering, or making decisions?: No Does the patient have difficulty walking or climbing stairs?: No Does the patient have difficulty dressing or bathing?: No Does the patient have difficulty doing errands alone such as visiting a doctor's office or shopping?: No   Assessment & Plan  1. Attention deficit hyperactivity disorder (ADHD), combined type  - amphetamine-dextroamphetamine (ADDERALL XR) 15 MG 24 hr capsule; Take 1 capsule by mouth every morning.  Dispense: 30 capsule; Refill: 0  2. GERD without esophagitis  Stable   3. Vitamin D deficiency  Continue vitamin D daily   4. Obesity (  BMI 30.0-34.9)  Discussed with the patient the risk posed by an increased BMI. Discussed importance of portion control, calorie counting and at  least 150 minutes of physical activity weekly. Avoid sweet beverages and drink more water. Eat at least 6 servings of fruit and vegetables daily   5. 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  Dispense: 60 each; Refill: 2 - albuterol (VENTOLIN HFA) 108 (90 Base) MCG/ACT inhaler; Inhale 2 puffs into the lungs every 6 (six) hours as needed for wheezing or shortness of breath.  Dispense: 18 g; Refill: 0 - loratadine (CLARITIN) 10 MG tablet; Take 1 tablet (10 mg total) by mouth daily.  Dispense: 30 tablet; Refill: 2

## 2020-02-27 NOTE — Addendum Note (Signed)
Addended by: Steele Sizer F on: 02/27/2020 12:28 PM   Modules accepted: Orders

## 2020-03-03 ENCOUNTER — Encounter: Payer: Self-pay | Admitting: Family Medicine

## 2020-03-04 ENCOUNTER — Ambulatory Visit: Payer: Managed Care, Other (non HMO) | Admitting: Occupational Therapy

## 2020-03-07 ENCOUNTER — Telehealth: Payer: Self-pay | Admitting: Family Medicine

## 2020-03-07 NOTE — Telephone Encounter (Signed)
Prior authorization number: [id number]   Pt is missing her refill, she states that PA is needed   amphetamine-dextroamphetamine (ADDERALL XR) 15 MG 24 hr capsule  [place] [place] [address]  Phone: [phone] Fax: [phone]

## 2020-03-10 NOTE — Telephone Encounter (Signed)
Yes. I just have to fax this sheet off.

## 2020-03-12 ENCOUNTER — Ambulatory Visit: Payer: Managed Care, Other (non HMO) | Attending: Internal Medicine | Admitting: Occupational Therapy

## 2020-03-12 ENCOUNTER — Other Ambulatory Visit: Payer: Self-pay

## 2020-03-12 ENCOUNTER — Encounter: Payer: Self-pay | Admitting: Occupational Therapy

## 2020-03-12 DIAGNOSIS — N644 Mastodynia: Secondary | ICD-10-CM | POA: Diagnosis present

## 2020-03-12 DIAGNOSIS — I890 Lymphedema, not elsewhere classified: Secondary | ICD-10-CM | POA: Diagnosis not present

## 2020-03-12 NOTE — Therapy (Signed)
[facility] [address], [place], [address] Phone: [phone]   Fax:  [phone]  Occupational Therapy Evaluation  Patient Details  Name: Shelby Barry MRN: [id number] Date of Birth: Apr 01, 1966 Referring Provider (OT): Dr Rogue Bussing   Encounter Date: 03/12/2020  OT End of Session - 03/12/20 1852    Visit Number  1    Number of Visits  6    Date for OT Re-Evaluation  04/23/20    OT Start Time  [id number]    OT Stop Time  1440    OT Time Calculation (min)  45 min    Activity Tolerance  Patient tolerated treatment well    Behavior During Therapy  [facility] for tasks assessed/performed       Past Medical History:  Diagnosis Date  . Anemia   . Anxiety   . Breast cancer ([place]) 09/2016   right breast  . Bursitis of right shoulder   . Cancer [facility])    breast  . 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 Cataract, bilateral   . Chemotherapy induced nausea and vomiting   . Cold    states she has had it for 2 months  . Depression   . GERD (gastroesophageal reflux disease)    uses baking soda for symptoms  . Insomnia   . Lumbago   . Personal history of chemotherapy 11/22/2016  . Personal history of radiation therapy 01/20/2017  . Vitamin D deficiency     Past Surgical History:  Procedure Laterality Date  . ABDOMINAL HYSTERECTOMY  04/20/2014  . BILATERAL SALPINGECTOMY    . BREAST BIOPSY Right 09/27/2016   positive  . BREAST LUMPECTOMY Right 10/12/2016   positive  . BREAST LUMPECTOMY WITH SENTINEL LYMPH NODE BIOPSY Right 10/12/2016   Procedure: BREAST LUMPECTOMY WITH SENTINEL LYMPH NODE BX;  Surgeon: Christene Lye, MD;  Location: [facility];  Service: General;  Laterality: Right;  . COLONOSCOPY WITH PROPOFOL N/A 05/20/2016   Procedure: COLONOSCOPY WITH PROPOFOL;  Surgeon: Lucilla Lame, MD;  Location: [place];  Service: Endoscopy;   Laterality: N/A;  . ECTOPIC PREGNANCY SURGERY    . ENDOMETRIAL ABLATION    . PORTACATH PLACEMENT Left 11/04/2016   Procedure: INSERTION PORT-A-CATH;  Surgeon: Christene Lye, MD;  Location: [facility];  Service: General;  Laterality: Left;  Left subclavian vein  . TUBAL LIGATION      There were no vitals filed for this visit.  Subjective Assessment - 03/12/20 1843    Subjective   I feel like my R breast at times swollen, shooting pain - better if I massage it since the radiation and my upper arm feels like it hands and flappy more    Pertinent History  Pt had 10/12/16 R breast CA with lumpectomy , per pt 3 ln removed - had radiation in June/July and chemo - feels like her arm is getting more swollen and breast , under arm - she do not want it to get larger - she did move and pack boxes this past frew months    Patient Stated Goals  I do not want my R arm to get big and get the shooting pain better in my breast    Currently in Pain?  No/denies        [facility] OT Assessment - 03/12/20 0001      Assessment   Medical Diagnosis  R UE and thoracic  lymphedema     Referring Provider (OT)  Dr Rogue Bussing    Onset Date/Surgical Date  01/21/20    Hand Dominance  Right      Home  Environment   Lives With  --   Boyfriend     Prior Function   Vocation  Full time employment    Leisure  [employer], yardwork, dogs, house work       AROM   Overall AROM Comments  AROM for shoulders WNL , strength 5/5       LYMPHEDEMA/ONCOLOGY QUESTIONNAIRE - 03/12/20 1841      Right Upper Extremity Lymphedema   15 cm Proximal to Olecranon Process  38.5 cm    10 cm Proximal to Olecranon Process  34 cm    Olecranon Process  29 cm    15 cm Proximal to Ulnar Styloid Process  27.2 cm    10 cm Proximal to Ulnar Styloid Process  24.5 cm    Just Proximal to Ulnar Styloid Process  18 cm    Across Hand at [employer]  19.4 cm    At [place] of 2nd Digit  6.7 cm    At [facility] of Thumb  6.5 cm      Left Upper  Extremity Lymphedema   15 cm Proximal to Olecranon Process  36 cm    10 cm Proximal to Olecranon Process  32 cm    Olecranon Process  27 cm    15 cm Proximal to Ulnar Styloid Process  27 cm    10 cm Proximal to Ulnar Styloid Process  25 cm    Just Proximal to Ulnar Styloid Process  17.9 cm    Across Hand at [employer]  19 cm    At [place] of 2nd Digit  6.5 cm    At [facility] of Thumb  6.5 cm        Soft tissue massage -circles to hard areas on R breast prior to MLD  Do manual lymph drainage once each day to help decrease swelling.  This should take you about 20 minutes depending on the size of your limb.  For Right Arm:  Felicie Morn yourself at the base of your neck and do 8 small circles, and 2 fingers behind clavicle 8 x  . Do 8 semicircles at left armpit and right groin . Pump across chest from right to left 8 times . Pump down the right side of trunk from armpit to groin 8 times . Pump up the outside of right upper arm 8 times . Pump across chest from R to L 8 times . Pump  down the right side of trunk from armpit to groin 8 times . Do 8 semicircles at left armpit and right groin 8 times . Repeat nr.1  SLOW and LIGHT with only your palm NOT FINGERTIPS If help at home can replace or do also massage over upper back from right to left , when doing chest from right to left.   Recommend getting jovipak breast pad for wearing under bra or camisole - and unilateral post mastectomy one  And over the counter compression sleeve to wear during day as much as she can until appt with me                OT Education - 03/12/20 1852    Education Details  findings of eval and HEP    Person(s) Educated  Patient    Methods  Explanation;Demonstration;Verbal cues;Tactile cues;Handout  Comprehension  Returned demonstration;Verbalized understanding;Verbal cues required          OT Long Term Goals - 03/12/20 1856      OT LONG TERM GOAL #1   Title  Pt to be independent in homeprogram  to decrease fibrosis in R breast, lymphedema in thoracic and R UE    Baseline  no knowlledge    Time  4    Period  Weeks    Status  New    Target Date  04/09/20      OT LONG TERM GOAL #2   Title  Pt to be independent in use of compression garments to decrease and maintain circumference in R breast and UE    Baseline  increase by 2 cm at R elbow ,and upper arm 2.3 cm - fibrotic tissue in R breast under nipple , heavy breast and shooting pain at times    Time  6    Period  Weeks    Status  New    Target Date  04/23/20            Plan - 03/12/20 1853    Clinical Impression Statement  Pt present at OT eval with R UE and thoracic lymphedema - appear to get worsen since she moved and picked up/packed boxes - pt has some fibrotic, tender and hard areas on R breast below nipple, and with episodes of shooting pain at times , and her R upper arm is increase by 2 cm at elbow , 2.5 cm at upper arm - pt with stage 2 lymphedema - pt ed on self MLD and can benefit form CDT by OT to decrease circumference, pain , fibrosis in breast and fit with correct compression garments    OT Occupational Profile and History  Problem Focused Assessment - Including review of records relating to presenting problem    Occupational performance deficits (Please refer to evaluation for details):  ADL's;IADL's;Play    Body Structure / Function / Physical Skills  ADL;Scar mobility;IADL;Pain;Edema    Rehab Potential  Good    Clinical Decision Making  Limited treatment options, no task modification necessary    Comorbidities Affecting Occupational Performance:  None    Modification or Assistance to Complete Evaluation   No modification of tasks or assist necessary to complete eval    OT Frequency  1x / week    OT Duration  6 weeks    OT Treatment/Interventions  Self-care/ADL training;Manual lymph drainage;Patient/family education;Compression bandaging;Manual Therapy;DME and/or AE instruction;Scar mobilization    Plan   assess progress and doing with HEP    OT Home Exercise Plan  see pt instruction    Consulted and Agree with Plan of Care  Patient       Patient will benefit from skilled therapeutic intervention in order to improve the following deficits and impairments:   Body Structure / Function / Physical Skills: ADL, Scar mobility, IADL, Pain, Edema       Visit Diagnosis: Lymphedema, not elsewhere classified - Plan: Ot plan of care cert/re-cert  Breast pain, left - Plan: Ot plan of care cert/re-cert    Problem List Patient Active Problem List   Diagnosis Date Noted  . Obesity (BMI 30.0-34.9) 05/18/2018  . Liver lesion 11/09/2016  . Chronic bronchitis ([place]) 10/29/2016  . 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 Recurrent major depression-severe ([place]) 08/22/2015  . Bursitis of shoulder 08/22/2015  . Bilateral cataracts 08/22/2015  .  Circadian rhythm sleep disorder, 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  . Vitamin D deficiency 08/22/2015  . LBP (low back pain) 08/22/2015  . GAD (generalized anxiety disorder) 08/22/2015  . History of anemia 08/17/2007  . Insomnia 08/16/2007    Rosalyn Gess OTR/l,CLT 03/12/2020, 7:01 PM  [facility] [address] [place], [address] Phone: [phone]   Fax:  [phone]  Name: IRINE CHAMBLISS MRN: [id number] Date of Birth: [birth year]

## 2020-03-12 NOTE — Patient Instructions (Signed)
Soft tissue massage -circles to hard areas on R breast prior to MLD  Do manual lymph drainage once each day to help decrease swelling.  This should take you about 20 minutes depending on the size of your limb.  For Right Arm:  Felicie Morn yourself at the base of your neck and do 8 small circles, and 2 fingers behind clavicle 8 x  . Do 8 semicircles at left armpit and right groin . Pump across chest from right to left 8 times . Pump down the right side of trunk from armpit to groin 8 times . Pump up the outside of right upper arm 8 times . Pump across chest from R to L 8 times . Pump  down the right side of trunk from armpit to groin 8 times . Do 8 semicircles at left armpit and right groin 8 times . Repeat nr.1  SLOW and LIGHT with only your palm NOT FINGERTIPS If help at home can replace or do also massage over upper back from right to left , when doing chest from right to left.   Recommend getting jovipak breast pad for wearing under bra or camisole - and unilateral post mastectomy one  And over the counter compression sleeve to wear during day as much as she can until appt with me

## 2020-03-13 ENCOUNTER — Encounter: Payer: Self-pay | Admitting: Internal Medicine

## 2020-03-14 MED ORDER — INV-ASPIRIN/PLACEBO 300 MG TABS ALLIANCE A011502: 1.0000 | ORAL_TABLET | Freq: Every day | ORAL | 0 refills | Status: DC

## 2020-03-20 ENCOUNTER — Ambulatory Visit: Payer: Managed Care, Other (non HMO) | Admitting: Occupational Therapy

## 2020-03-20 NOTE — Research (Signed)
30 month visit for ABC [id number] : LATE Entry Shelby Barry presented to the clinic this morning for her 30 month protocol visit for ABC [id number] unaccompanied She was assessed in the clinic by Dr. Rogue Bussing today with no new complaints, he is ordering her next Mammogram to be completed in December. The patient does report some mild right lymphedema, Dr. Rogue Bussing is referring her to the lymphedema clinic. Patient was reminded to try to avoid taking Aspirin, Ibuprofen, Aleve or other drugs in this class while she is on study. She denies any  bleeding events. She  reports not taking her Arimidex and feeling much better without any side effects now. States she continues to have reflux but not with emesis and she is no longer taking any rx for this other than baking soda.She denies any fractures, bruising, bleeding, muscle issues or any other symptoms related to aspirin / placebo.The patient returned her one bottle of IP for the next cycle at this time.  Her drug diaries were collected at the same time that new drug was dispensed. RX # [id number] exp: 10/21/2020 300 mg capsules, # 600 ASA / Placebo and medication diaries for the next 6 months provided to the patient.  Shelby Barry was instructed to call if she has any questions/concerns. AEs and Solicited AEs are as follows:   Study/Protocol: [id number] ABC Cycle: 30 months Event Grade Onset Date Resolved Date Drug Name Attribution Treatment Comments  Lymphedema 1 02/20/2020   unrelated Lymphedema clinic   Bruising *Solicited AE 1  123456 Aspirin/placebo possible none   Bil. Pedal edema 1    unlikely none   Insomnia 1 Medical history   unrelated Has Trazodone but not taking   Hot Flashes 1  08/08/2019  unrelated    Neuropathy in fingers 1  08/08/2019  unrelated none States this is from chemo  Vomitting 1  08/08/2019  unrelated  When she eats fatty foods  Indigestion 1 Medical History   Unrelated Baking Soda Has prilosec but not taking.   [name], RN, BSN, OCN 03/20/2020 late entry for 02/20/2020

## 2020-03-21 ENCOUNTER — Other Ambulatory Visit: Payer: Self-pay | Admitting: Family Medicine

## 2020-03-21 DIAGNOSIS — J449 Chronic obstructive pulmonary disease, unspecified: Secondary | ICD-10-CM

## 2020-03-21 NOTE — Telephone Encounter (Signed)
Requested medication (s) are due for refill today: no  Requested medication (s) are on the active medication list: yes  Last refill: 02/27/2020  Future visit scheduled: yes  Notes to clinic:   One inhaler should last at least one month. If the patient is requesting refills earlier, contact the patient to check for uncontrolled symptoms     Requested Prescriptions  Pending Prescriptions Disp Refills   albuterol (VENTOLIN HFA) 108 (90 Base) MCG/ACT inhaler [Pharmacy Med Name: ALBUTEROL HFA INH (200 PUFFS)8.5GM] 8.5 g     Sig: INHALE 2 PUFFS INTO THE LUNGS EVERY 6 HOURS AS NEEDED FOR WHEEZING OR SHORTNESS OF BREATH      Pulmonology:  Beta Agonists Failed - 03/21/2020 10:14 AM      Failed - One inhaler should last at least one month. If the patient is requesting refills earlier, contact the patient to check for uncontrolled symptoms.      Passed - Valid encounter within last 12 months    Recent Outpatient Visits           3 weeks ago Attention deficit hyperactivity disorder (ADHD), combined type   [facility] [place], Drue Stager, MD   5 months ago Gastroesophageal reflux disease without esophagitis   [facility] Steele Sizer, MD   1 year ago Major depression, recurrent, chronic [facility])   [facility] Steele Sizer, MD   1 year ago Cough   [facility] Arnetha Courser, MD   2 years ago Well woman exam   [facility] Steele Sizer, MD       Future Appointments             In 2 weeks Steele Sizer, MD [facility], [facility]

## 2020-03-24 ENCOUNTER — Other Ambulatory Visit: Payer: Self-pay | Admitting: Family Medicine

## 2020-03-24 DIAGNOSIS — J449 Chronic obstructive pulmonary disease, unspecified: Secondary | ICD-10-CM

## 2020-03-28 ENCOUNTER — Ambulatory Visit: Payer: Managed Care, Other (non HMO) | Admitting: Family Medicine

## 2020-04-07 ENCOUNTER — Ambulatory Visit: Payer: Managed Care, Other (non HMO) | Admitting: Family Medicine

## 2020-04-08 ENCOUNTER — Other Ambulatory Visit: Payer: Self-pay

## 2020-04-08 ENCOUNTER — Encounter: Payer: Self-pay | Admitting: Family Medicine

## 2020-04-08 ENCOUNTER — Ambulatory Visit (INDEPENDENT_AMBULATORY_CARE_PROVIDER_SITE_OTHER): Payer: Managed Care, Other (non HMO) | Admitting: Family Medicine

## 2020-04-08 VITALS — BP 128/72 | HR 100 | Temp 97.6°F | Resp 16 | Ht 63.0 in | Wt 186.8 lb

## 2020-04-08 DIAGNOSIS — F902 Attention-deficit hyperactivity disorder, combined type: Secondary | ICD-10-CM | POA: Diagnosis not present

## 2020-04-08 MED ORDER — AMPHETAMINE-DEXTROAMPHET ER 20 MG PO CP24: 20.0000 mg | ORAL_CAPSULE | ORAL | 0 refills | Status: DC

## 2020-04-08 NOTE — Progress Notes (Signed)
Name: Shelby Barry   MRN: [id number]    DOB: 11-Sep-1966   Date:04/08/2020       Progress Note  Subjective  Chief Complaint  Chief Complaint  Patient presents with  . 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  Discussed multiple options and classes of medication during her last visit. She was started on Adderal XR 15 mg, she feels like it works well, she does not take it daily - sometimes skips when off work, however medication not lasting her entire shift. She works over 12 hours three days week, she also has a part time job doing cleaning / pressure washing/painting and it also helps with that type of activity. Discussed going up on the dose or adding immediate release prn and she chose the former. No side effects of stimulant, appetite is normal, no palpitation.    Patient Active Problem List   Diagnosis Date Noted  . Obesity (BMI 30.0-34.9) 05/18/2018  . Liver lesion 11/09/2016  . Chronic bronchitis ([place]) 10/29/2016  . 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 Recurrent major depression-severe ([place]) 08/22/2015  . Bursitis of shoulder 08/22/2015  . Bilateral cataracts 08/22/2015  . Circadian rhythm sleep disorder, 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  . Vitamin D deficiency 08/22/2015  . LBP (low back pain) 08/22/2015  . GAD (generalized anxiety disorder) 08/22/2015  . History of anemia 08/17/2007  . Insomnia 08/16/2007    Past Surgical History:  Procedure Laterality Date  . ABDOMINAL HYSTERECTOMY  04/20/2014  . BILATERAL SALPINGECTOMY    . BREAST BIOPSY Right  09/27/2016   positive  . BREAST LUMPECTOMY Right 10/12/2016   positive  . BREAST LUMPECTOMY WITH SENTINEL LYMPH NODE BIOPSY Right 10/12/2016   Procedure: BREAST LUMPECTOMY WITH SENTINEL LYMPH NODE BX;  Surgeon: Christene Lye, MD;  Location: [facility];  Service: General;  Laterality: Right;  . COLONOSCOPY WITH PROPOFOL N/A 05/20/2016   Procedure: COLONOSCOPY WITH PROPOFOL;  Surgeon: Lucilla Lame, MD;  Location: [place];  Service: Endoscopy;  Laterality: N/A;  . ECTOPIC PREGNANCY SURGERY    . ENDOMETRIAL ABLATION    . PORTACATH PLACEMENT Left 11/04/2016   Procedure: INSERTION PORT-A-CATH;  Surgeon: Christene Lye, MD;  Location: [facility];  Service: General;  Laterality: Left;  Left subclavian vein  . TUBAL LIGATION      Family History  Problem Relation Age of Onset  . Heart disease Mother   . Depression Father   . Obesity Father   . Diabetes Father   . Alcohol abuse Father   . COPD Father   . Alcohol abuse Brother   . Seizures Brother   . Breast cancer Sister 20    Social History   Tobacco Use  . Smoking status: Former Smoker    Packs/day: 0.50    Years: 20.00    Pack years: 10.00    Types: Cigarettes    Quit date: 08/23/2016    Years since quitting: 3.6  . Smokeless tobacco: Never Used  Substance Use Topics  . Alcohol use: Yes    Alcohol/week:  0.0 standard drinks    Comment: occassional     Current Outpatient Medications:  .  acetaminophen (TYLENOL) 325 MG tablet, Take 325 mg by mouth every 6 (six) hours as needed for fever., Disp: , Rfl:  .  albuterol (VENTOLIN HFA) 108 (90 Base) MCG/ACT inhaler, INHALE 2 PUFFS INTO THE LUNGS EVERY 6 HOURS AS NEEDED FOR WHEEZING OR SHORTNESS OF BREATH, Disp: 8.5 g, Rfl: 0 .  amphetamine-dextroamphetamine (ADDERALL XR) 15 MG 24 hr capsule, Take 1 capsule by mouth every morning., Disp: 30 capsule, Rfl: 0 .  fluticasone furoate-vilanterol (BREO ELLIPTA) 100-25 MCG/INH AEPB, Inhale 1 puff into the lungs daily.,  Disp: 60 each, Rfl: 2 .  Investigational aspirin/placebo 300 MG tablet Alliance [id number], Take 1 tablet by mouth daily. Take with food or a full glass of water.  Do not crush enteric coated tablets., Disp: 180 tablet, Rfl: 0 .  loratadine (CLARITIN) 10 MG tablet, Take 1 tablet (10 mg total) by mouth daily., Disp: 30 tablet, Rfl: 2 .  meloxicam (MOBIC) 15 MG tablet, TAKE 1 TABLET BY MOUTH  DAILY, Disp: 90 tablet, Rfl: 0 .  Multiple Vitamin (MULTIVITAMIN WITH MINERALS) TABS tablet, Take 1 tablet by mouth daily. One-A-Day, Disp: , Rfl:  .  omeprazole (PRILOSEC) 20 MG capsule, Take 1 capsule (20 mg total) by mouth daily., Disp: 90 capsule, Rfl: 1  Allergies  Allergen Reactions  . Adhesive [Tape] Other (See Comments)    Blisters/rash  PLEASE USE PAPER TAPE ONLY  . Nickel     PT CAN WEAR GOLD, STERLING SILVER WITH NO PROBLEM-PT HAS NEVER HAD ANY PROBLEMS IN THE OR    I personally reviewed active problem list, medication list, allergies, family history, social history, health maintenance with the patient/caregiver today.   ROS  Constitutional: Negative for fever or weight change.  Respiratory: Negative for cough and shortness of breath.   Cardiovascular: Negative for chest pain or palpitations.  Gastrointestinal: Negative for abdominal pain, no bowel changes.  Musculoskeletal: Negative for gait problem or joint swelling.  Skin: Negative for rash.  Neurological: Negative for dizziness or headache.  No other specific complaints in a complete review of systems (except as listed in HPI above).  Objective  Vitals:   04/08/20 1150  BP: 128/72  Pulse: 100  Resp: 16  Temp: 97.6 F (36.4 C)  TempSrc: Temporal  SpO2: 96%  Weight: 186 lb 12.8 oz (84.7 kg)  Height: 5\' 3"  (1.6 m)    Body mass index is 33.09 kg/m.  Physical Exam  Constitutional: Patient appears well-developed and well-nourished. Obese  No distress.  HEENT: head atraumatic, normocephalic, pupils equal and reactive to  light,  neck supple Cardiovascular: Normal rate, regular rhythm and normal heart sounds.  No murmur heard. No BLE edema. Pulmonary/Chest: Effort normal and breath sounds normal. No respiratory distress. Abdominal: Soft.  There is no tenderness. Psychiatric: Patient has a normal mood and affect. behavior is normal. Judgment and thought content normal.  Recent Results (from the past 2160 hour(s))  Lipid panel     Status: Abnormal   Collection Time: 02/22/20  1:31 PM  Result Value Ref Range   Cholesterol, Total 185 100 - 199 mg/dL   Triglycerides 112 0 - 149 mg/dL   HDL 59 >39 mg/dL   VLDL Cholesterol Cal 20 5 - 40 mg/dL   LDL Chol Calc (NIH) 106 (H) 0 - 99 mg/dL   Chol/HDL Ratio 3.1 0.0 - 4.4 ratio    Comment:  T. Chol/HDL Ratio                                             Men  Women                               1/2 Avg.Risk  3.4    3.3                                   Avg.Risk  5.0    4.4                                2X Avg.Risk  9.6    7.1                                3X Avg.Risk 23.4   11.0   CBC with Differential/Platelet     Status: None   Collection Time: 02/22/20  1:31 PM  Result Value Ref Range   WBC 6.8 3.4 - 10.8 x10E3/uL   RBC 4.89 3.77 - 5.28 x10E6/uL   Hemoglobin 13.8 11.1 - 15.9 g/dL   Hematocrit 41.8 34.0 - 46.6 %   MCV 86 79 - 97 fL   MCH 28.2 26.6 - 33.0 pg   MCHC 33.0 31.5 - 35.7 g/dL   RDW 12.6 11.7 - 15.4 %   Platelets 301 150 - 450 x10E3/uL   Neutrophils 58 Not Estab. %   Lymphs 31 Not Estab. %   Monocytes 6 Not Estab. %   Eos 4 Not Estab. %   Basos 1 Not Estab. %   Neutrophils Absolute 4.0 1.4 - 7.0 x10E3/uL   Lymphocytes Absolute 2.1 0.7 - 3.1 x10E3/uL   Monocytes Absolute 0.4 0.1 - 0.9 x10E3/uL   EOS (ABSOLUTE) 0.3 0.0 - 0.4 x10E3/uL   Basophils Absolute 0.1 0.0 - 0.2 x10E3/uL   Immature Granulocytes 0 Not Estab. %   Immature Grans (Abs) 0.0 0.0 - 0.1 x10E3/uL  Comprehensive metabolic panel     Status: Abnormal    Collection Time: 02/22/20  1:31 PM  Result Value Ref Range   Glucose 93 65 - 99 mg/dL   BUN 12 6 - 24 mg/dL   Creatinine, Ser 0.73 0.57 - 1.00 mg/dL   GFR calc non Af Amer 94 >59 mL/min/1.73   GFR calc Af Amer 108 >59 mL/min/1.73   BUN/Creatinine Ratio 16 9 - 23   Sodium 141 134 - 144 mmol/L   Potassium 4.4 3.5 - 5.2 mmol/L   Chloride 101 96 - 106 mmol/L   CO2 23 20 - 29 mmol/L   Calcium 9.6 8.7 - 10.2 mg/dL   Total Protein 6.2 6.0 - 8.5 g/dL   Albumin 4.3 3.8 - 4.9 g/dL   Globulin, Total 1.9 1.5 - 4.5 g/dL   Albumin/Globulin Ratio 2.3 (H) 1.2 - 2.2   Bilirubin Total 0.3 0.0 - 1.2 mg/dL   Alkaline Phosphatase 107 39 - 117 IU/L   AST 22 0 - 40 IU/L   ALT 23 0 - 32 IU/L  Hemoglobin A1c     Status: None   Collection Time: 02/22/20  1:31  PM  Result Value Ref Range   Hgb A1c MFr Bld 5.3 4.8 - 5.6 %    Comment:          Prediabetes: 5.7 - 6.4          Diabetes: >6.4          Glycemic control for adults with diabetes: <7.0    Est. average glucose Bld gHb Est-mCnc 105 mg/dL  VITAMIN D 25 Hydroxy (Vit-D Deficiency, Fractures)     Status: None   Collection Time: 02/22/20  1:31 PM  Result Value Ref Range   Vit D, 25-Hydroxy 31.4 30.0 - 100.0 ng/mL    Comment: Vitamin D deficiency has been defined by the [place] and an Endocrine Society practice guideline as a level of serum 25-OH vitamin D less than 20 ng/mL (1,2). The Endocrine Society went on to further define vitamin D insufficiency as a level between 21 and 29 ng/mL (2). 1. IOM (Institute of Medicine). 2010. Dietary reference    intakes for calcium and D. [place]: The    [employer]. 2. Holick MF, [place], Bischoff-Ferrari HA, et al.    Evaluation, treatment, and prevention of vitamin D    deficiency: an Endocrine Society clinical practice    guideline. JCEM. 2011 Jul; 96(7):1911-30.   Vitamin B12     Status: None   Collection Time: 02/22/20  1:31 PM  Result Value Ref Range   Vitamin  B-12 852 232 - 1,245 pg/mL  TSH     Status: None   Collection Time: 02/22/20  1:31 PM  Result Value Ref Range   TSH 2.760 0.450 - 4.500 uIU/mL      PHQ2/9: Depression screen [facility] 2/9 04/08/2020 02/27/2020 10/09/2019 06/14/2018 05/18/2018  Decreased Interest 0 1 0 0 0  Down, Depressed, Hopeless 0 0 0 2 0  PHQ - 2 Score 0 1 0 2 0  Altered sleeping 0 2 0 1 -  Tired, decreased energy 0 1 1 3  -  Change in appetite 0 0 0 0 -  Feeling bad or failure about yourself  0 0 0 1 -  Trouble concentrating 0 3 3 2  -  Moving slowly or fidgety/restless 0 0 0 1 -  Suicidal thoughts 0 0 0 1 -  PHQ-9 Score 0 7 4 11  -  Difficult doing work/chores Not difficult at all Not difficult at all Very difficult Somewhat difficult -    phq 9 is negative   Fall Risk: Fall Risk  04/08/2020 02/27/2020 10/09/2019 06/14/2018 05/18/2018  Falls in the past year? 0 0 0 No No  Number falls in past yr: 0 0 0 - -  Injury with Fall? 0 0 0 - -  Follow up Falls evaluation completed - - - -      Assessment & Plan  1. Attention deficit hyperactivity disorder (ADHD), combined type  - amphetamine-dextroamphetamine (ADDERALL XR) 20 MG 24 hr capsule; Take 1 capsule (20 mg total) by mouth every morning.  Dispense: 30 capsule; Refill: 0 - amphetamine-dextroamphetamine (ADDERALL XR) 20 MG 24 hr capsule; Take 1 capsule (20 mg total) by mouth every morning.  Dispense: 30 capsule; Refill: 0

## 2020-04-09 ENCOUNTER — Other Ambulatory Visit: Payer: Self-pay | Admitting: Family Medicine

## 2020-04-09 DIAGNOSIS — K219 Gastro-esophageal reflux disease without esophagitis: Secondary | ICD-10-CM

## 2020-04-30 ENCOUNTER — Other Ambulatory Visit: Payer: Self-pay | Admitting: Family Medicine

## 2020-04-30 DIAGNOSIS — J4489 Other specified chronic obstructive pulmonary disease: Secondary | ICD-10-CM

## 2020-04-30 DIAGNOSIS — J449 Chronic obstructive pulmonary disease, unspecified: Secondary | ICD-10-CM

## 2020-04-30 NOTE — Telephone Encounter (Signed)
Requested Prescriptions  Pending Prescriptions Disp Refills  . albuterol (VENTOLIN HFA) 108 (90 Base) MCG/ACT inhaler [Pharmacy Med Name: ALBUTEROL HFA INH (200 PUFFS)8.5GM] 8.5 g 0    Sig: INHALE 2 PUFFS INTO THE LUNGS EVERY 6 HOURS AS NEEDED FOR WHEEZING OR SHORTNESS OF BREATH     Pulmonology:  Beta Agonists Failed - 04/30/2020  5:10 PM      Failed - One inhaler should last at least one month. If the patient is requesting refills earlier, contact the patient to check for uncontrolled symptoms.      Passed - Valid encounter within last 12 months    Recent Outpatient Visits          3 weeks ago Attention deficit hyperactivity disorder (ADHD), combined type   [facility] Steele Sizer, MD   2 months ago Attention deficit hyperactivity disorder (ADHD), combined type   [facility] [place], Drue Stager, MD   6 months ago Gastroesophageal reflux disease without esophagitis   [facility] Steele Sizer, MD   1 year ago Major depression, recurrent, chronic [facility])   [facility] Steele Sizer, MD   1 year ago Cough   [facility] Arnetha Courser, MD      Future Appointments            In 2 months Steele Sizer, MD [facility], [place]           last ordered 03/21/20

## 2020-05-21 ENCOUNTER — Telehealth: Payer: Self-pay

## 2020-05-21 NOTE — Telephone Encounter (Signed)
Error

## 2020-05-30 ENCOUNTER — Other Ambulatory Visit: Payer: Self-pay | Admitting: Family Medicine

## 2020-05-30 DIAGNOSIS — J449 Chronic obstructive pulmonary disease, unspecified: Secondary | ICD-10-CM

## 2020-05-30 DIAGNOSIS — J4489 Other specified chronic obstructive pulmonary disease: Secondary | ICD-10-CM

## 2020-07-04 ENCOUNTER — Other Ambulatory Visit: Payer: Self-pay | Admitting: Family Medicine

## 2020-07-04 DIAGNOSIS — F902 Attention-deficit hyperactivity disorder, combined type: Secondary | ICD-10-CM

## 2020-07-04 NOTE — Telephone Encounter (Signed)
Medication Refill - Medication: amphetamine-dextroamphetamine (ADDERALL XR) 20 MG 24 hr capsule   Has the patient contacted their pharmacy? Yes.   (Agent: If no, request that the patient contact the pharmacy for the refill.) (Agent: If yes, when and what did the pharmacy advise?)  Preferred Pharmacy (with phone number or street name):  [facility] [place] - [address] [facility]  [address] [place] [address]  Phone: [phone] Fax: [phone]    Agent: Please be advised that RX refills may take up to 3 business days. We ask that you follow-up with your pharmacy.

## 2020-07-14 ENCOUNTER — Encounter: Payer: Self-pay | Admitting: Family Medicine

## 2020-07-14 ENCOUNTER — Ambulatory Visit (INDEPENDENT_AMBULATORY_CARE_PROVIDER_SITE_OTHER): Payer: Managed Care, Other (non HMO) | Admitting: Family Medicine

## 2020-07-14 ENCOUNTER — Other Ambulatory Visit: Payer: Self-pay

## 2020-07-14 VITALS — BP 120/82 | HR 71 | Temp 97.6°F | Resp 14 | Ht 61.5 in | Wt 179.7 lb

## 2020-07-14 DIAGNOSIS — F902 Attention-deficit hyperactivity disorder, combined type: Secondary | ICD-10-CM | POA: Diagnosis not present

## 2020-07-14 DIAGNOSIS — E559 Vitamin D deficiency, unspecified: Secondary | ICD-10-CM

## 2020-07-14 DIAGNOSIS — K219 Gastro-esophageal reflux disease without esophagitis: Secondary | ICD-10-CM | POA: Diagnosis not present

## 2020-07-14 DIAGNOSIS — E66811 Obesity, class 1: Secondary | ICD-10-CM

## 2020-07-14 DIAGNOSIS — F339 Major depressive disorder, recurrent, unspecified: Secondary | ICD-10-CM

## 2020-07-14 DIAGNOSIS — J449 Chronic obstructive pulmonary disease, unspecified: Secondary | ICD-10-CM | POA: Diagnosis not present

## 2020-07-14 DIAGNOSIS — E669 Obesity, unspecified: Secondary | ICD-10-CM

## 2020-07-14 DIAGNOSIS — J4489 Other specified chronic obstructive pulmonary disease: Secondary | ICD-10-CM

## 2020-07-14 MED ORDER — AMPHETAMINE-DEXTROAMPHET ER 20 MG PO CP24: 20.0000 mg | ORAL_CAPSULE | Freq: Every day | ORAL | 0 refills | Status: DC

## 2020-07-14 MED ORDER — AMPHETAMINE-DEXTROAMPHET ER 20 MG PO CP24: 20.0000 mg | ORAL_CAPSULE | ORAL | 0 refills | Status: DC

## 2020-07-14 NOTE — Patient Instructions (Signed)

## 2020-07-14 NOTE — Progress Notes (Signed)
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 in [place] and the diagnosis was positive for ADD She feels overwhelmed all the time, she is always pacing, busy at home, worries about not being able to stay focused on tasks, starts multiple projects without finishing.  She was started on Adderal XR 15 mg earlier this year,but is not on 20 mg because initial dose was not lasting all day, currently lasts until about 4 , but still able to focus while at home in the evening. No affecting her sleep No side effects of medications such palpitation, but she has noticed lack of appetite in the middle of the day , she lost 8 lbs since last visit.   History of breast cancer: still seeing hematologist, stopped anastrozole months ago, tried Tamoxifen for about 6 months but also stopped in 2019 on her own . She is up to date with mammogram, still under the care of oncologist   GERD: she has been taking Omeprazole daily, she states she has heartburn and epigastric pain if she skips one dose of medication. Discussed long term side effects    Chronic bronchitis/asthma: she still has a cough a couple of times a week, but usually when eating - shocking like, she quit smoking in 2017. Very seldom has wheezing. She states that she also has allergies , strong scents triggers a cough, also when working in her yard. She was given breo but too expensive, she is back on rescue inhaler a couple of times a week.   Patient Active Problem List   Diagnosis Date Noted   Obesity (BMI 30.0-34.9) 05/18/2018   Liver lesion 11/09/2016   Chronic bronchitis ([place]) 10/29/2016   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,  estrogen receptor positive ([place]) 10/26/2016   Recurrent major depression-severe ([place]) 08/22/2015   Bursitis of shoulder 08/22/2015   Bilateral cataracts 08/22/2015   Circadian rhythm sleep disorder, 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   Vitamin D deficiency 08/22/2015   LBP (low back pain) 08/22/2015   GAD (generalized anxiety disorder) 08/22/2015   History of anemia 08/17/2007   Insomnia 08/16/2007    Past Surgical History:  Procedure Laterality Date   ABDOMINAL HYSTERECTOMY  04/20/2014   BILATERAL SALPINGECTOMY     BREAST BIOPSY Right 09/27/2016   positive   BREAST LUMPECTOMY Right 10/12/2016   positive   BREAST LUMPECTOMY WITH SENTINEL LYMPH NODE BIOPSY Right 10/12/2016   Procedure: BREAST LUMPECTOMY WITH SENTINEL LYMPH NODE BX;  Surgeon: Christene Lye, MD;  Location: [facility];  Service: General;  Laterality: Right;   COLONOSCOPY WITH PROPOFOL N/A 05/20/2016   Procedure: COLONOSCOPY WITH PROPOFOL;  Surgeon: Lucilla Lame, MD;  Location: [place];  Service: Endoscopy;  Laterality: N/A;   ECTOPIC PREGNANCY SURGERY     ENDOMETRIAL ABLATION     PORTACATH PLACEMENT Left 11/04/2016   Procedure: INSERTION PORT-A-CATH;  Surgeon: Christene Lye, MD;  Location: [facility];  Service: General;  Laterality: Left;  Left subclavian vein   TUBAL LIGATION      Family History  Problem Relation Age of Onset   Heart disease Mother  Depression Father    Obesity Father    Diabetes Father    Alcohol abuse Father    COPD Father    Alcohol abuse Brother    Seizures Brother    Breast cancer Sister 72    Social History   Tobacco Use   Smoking status: Former Smoker    Packs/day: 0.50    Years: 20.00    Pack years: 10.00    Types: Cigarettes    Quit date: 08/23/2016    Years since quitting: 3.8   Smokeless tobacco: Never Used  Substance Use Topics   Alcohol use: Yes    Alcohol/week: 0.0 standard drinks    Comment: occassional     Current  Outpatient Medications:    acetaminophen (TYLENOL) 325 MG tablet, Take 325 mg by mouth every 6 (six) hours as needed for fever., Disp: , Rfl:    albuterol (VENTOLIN HFA) 108 (90 Base) MCG/ACT inhaler, INHALE 2 PUFFS INTO THE LUNGS EVERY 6 HOURS AS NEEDED FOR WHEEZING OR SHORTNESS OF BREATH, Disp: 8.5 g, Rfl: 2   amphetamine-dextroamphetamine (ADDERALL XR) 20 MG 24 hr capsule, Take 1 capsule (20 mg total) by mouth every morning., Disp: 30 capsule, Rfl: 0   amphetamine-dextroamphetamine (ADDERALL XR) 20 MG 24 hr capsule, Take 1 capsule (20 mg total) by mouth every morning., Disp: 30 capsule, Rfl: 0   Investigational aspirin/placebo 300 MG tablet Alliance [id number], Take 1 tablet by mouth daily. Take with food or a full glass of water.  Do not crush enteric coated tablets., Disp: 180 tablet, Rfl: 0   loratadine (CLARITIN) 10 MG tablet, Take 1 tablet (10 mg total) by mouth daily., Disp: 30 tablet, Rfl: 2   meloxicam (MOBIC) 15 MG tablet, TAKE 1 TABLET BY MOUTH  DAILY, Disp: 90 tablet, Rfl: 0   Multiple Vitamin (MULTIVITAMIN WITH MINERALS) TABS tablet, Take 1 tablet by mouth daily. One-A-Day, Disp: , Rfl:    omeprazole (PRILOSEC) 20 MG capsule, TAKE 1 CAPSULE(20 MG) BY MOUTH DAILY, Disp: 90 capsule, Rfl: 1  Allergies  Allergen Reactions   Adhesive [Tape] Other (See Comments)    Blisters/rash  PLEASE USE PAPER TAPE ONLY   Nickel     PT CAN WEAR GOLD, STERLING SILVER WITH NO PROBLEM-PT HAS NEVER HAD ANY PROBLEMS IN THE OR    I personally reviewed active problem list, medication list, allergies, family history, social history, health maintenance with the patient/caregiver today.   ROS  Constitutional: Negative for fever ., positive for weight change.  Respiratory: positive  for intermittent cough but no  shortness of breath.   Cardiovascular: Negative for chest pain or palpitations.  Gastrointestinal: Negative for abdominal pain, no bowel changes.  Musculoskeletal: Negative for gait  problem or joint swelling.  Skin: Negative for rash.  Neurological: Negative for dizziness or headache.  No other specific complaints in a complete review of systems (except as listed in HPI above).   Objective  Vitals:   07/14/20 1339  BP: 120/82  Pulse: 71  Resp: 14  Temp: 97.6 F (36.4 C)  TempSrc: Oral  SpO2: 99%  Weight: 179 lb 11.2 oz (81.5 kg)  Height: 5' 1.5" (1.562 m)    Body mass index is 33.4 kg/m.  Physical Exam  Constitutional: Patient appears well-developed and well-nourished. Obese No distress.  HEENT: head atraumatic, normocephalic, pupils equal and reactive to light, eneck supple Cardiovascular: Normal rate, regular rhythm and normal heart sounds.  No murmur heard. No BLE edema. Pulmonary/Chest: Effort normal and breath sounds normal.  No respiratory distress. Abdominal: Soft.  There is no tenderness. Psychiatric: Patient has a normal mood and affect. behavior is normal. Judgment and thought content normal.  PHQ2/9: Depression screen [facility] 2/9 07/14/2020 04/08/2020 02/27/2020 10/09/2019 06/14/2018  Decreased Interest 1 0 1 0 0  Down, Depressed, Hopeless 1 0 0 0 2  PHQ - 2 Score 2 0 1 0 2  Altered sleeping 1 0 2 0 1  Tired, decreased energy 1 0 1 1 3   Change in appetite 0 0 0 0 0  Feeling bad or failure about yourself  0 0 0 0 1  Trouble concentrating 0 0 3 3 2   Moving slowly or fidgety/restless 0 0 0 0 1  Suicidal thoughts 0 0 0 0 1  PHQ-9 Score 4 0 7 4 11   Difficult doing work/chores Not difficult at all Not difficult at all Not difficult at all Very difficult Somewhat difficult    phq 9 is negative   Fall Risk: Fall Risk  07/14/2020 04/08/2020 02/27/2020 10/09/2019 06/14/2018  Falls in the past year? 0 0 0 0 No  Number falls in past yr: 0 0 0 0 -  Injury with Fall? 0 0 0 0 -  Follow up - Falls evaluation completed - - -     Functional Status Survey: Is the patient deaf or have difficulty hearing?: Yes Does the patient have difficulty seeing, even  when wearing glasses/contacts?: No Does the patient have difficulty concentrating, remembering, or making decisions?: Yes Does the patient have difficulty walking or climbing stairs?: No Does the patient have difficulty dressing or bathing?: No Does the patient have difficulty doing errands alone such as visiting a doctor's office or shopping?: No    Assessment & Plan  1. Attention deficit hyperactivity disorder (ADHD), combined type  - amphetamine-dextroamphetamine (ADDERALL XR) 20 MG 24 hr capsule; Take 1 capsule (20 mg total) by mouth every morning.  Dispense: 30 capsule; Refill: 0 - amphetamine-dextroamphetamine (ADDERALL XR) 20 MG 24 hr capsule; Take 1 capsule (20 mg total) by mouth every morning.  Dispense: 30 capsule; Refill: 0 - amphetamine-dextroamphetamine (ADDERALL XR) 20 MG 24 hr capsule; Take 1 capsule (20 mg total) by mouth daily.  Dispense: 30 capsule; Refill: 0  2. GERD without esophagitis  Under control   3. 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 Major depression, recurrent, chronic (HCC)  Stable, ,mild symptoms   6. Obesity (BMI 30.0-34.9)  Discussed with the patient the risk posed by an increased BMI. Discussed importance of portion control, calorie counting and at least 150 minutes of physical activity weekly. Avoid sweet beverages and drink more water. Eat at least 6 servings of fruit and vegetables daily

## 2020-08-20 ENCOUNTER — Inpatient Hospital Stay: Payer: Managed Care, Other (non HMO) | Attending: Internal Medicine | Admitting: Internal Medicine

## 2020-08-20 NOTE — Assessment & Plan Note (Deleted)
#  Breast cancer-stage II ER/PR positive HER-2/neu negative-OFF; enodcrine therapay-  STABLE. Mammogram 2020-WNL.   #Patient discontinued anastrozole because of poor tolerance.  Patient is not interested in any other alternative anti-estrogen therapy.  She is not interested in engaging in any further discussion regarding this.  She understands the risk of recurrence.  # right arm sweling- refer to [facility]   #Continues to be in the ABC trial/also on trial involving lifestyle changes.  Labs done today.  Discussed with clinical trials nurse  #Osteopenia T score -2.1; continue calcium plus vitamin   # Discussed with the patient regarding the ill effects of smoking- counseled against smoking; patient- interested. Recommend talking to her PCP regarding smoking cessation.   # DISPOSITION: # Lymphedema PT referral # follow up in 6 months-MD; no labs- Dr.B

## 2020-08-20 NOTE — Progress Notes (Deleted)
**Barry De-Identified via Barry** [place] OFFICE PROGRESS Barry  Patient Care Team: Steele Sizer, MD as PCP - General (Family Medicine) Christene Lye, MD (General Surgery) Steele Sizer, MD as Attending Physician (Family Medicine) Rico Junker, RN as Oncology Nurse Navigator Cammie Sickle, MD as Consulting Physician (Internal Medicine)  Cancer Staging No matching staging information was found for Shelby patient.   Oncology History Overview Barry  # DEC 2017- RIGHT BREAST Stage II [pT1cpN1sn; screening] ER/PR- Pos- 90%; her 2 NEU- NEG s/p Lumpect & SLNBx; Dr.Sankar ]; JAN 2018- ddAC -T x12. AUG 2nd [finished RT].  # AUG 21st 2018- Start TAM; Nov 2018- Stopped sec intol/mood swings; arthralgias];  DEC 2019- Arimidex 1 mg/day; STOPPED JAN 2020 sec to intolerarnce.   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  female pleasant patient above history of stage II breast cancer ER PR positive currently on anastrozole is Shelby for follow-up/ ABC-be well clinical studies.   Patient quit taking anastrozole since last visit.     She noted to have significant improvement of her symptoms -fatigue /hot flashes joint pains etc.  She feels back to baseline.  She does Barry of shortness of breath on exertion and also wheezing with coughing.  This is chronic.  No fevers.  Review of Systems  Constitutional: Positive for malaise/fatigue. Negative for chills, diaphoresis, fever and weight loss.  HENT: Negative for nosebleeds and sore throat.   Eyes: Negative for double vision.  Respiratory: Positive for cough and  wheezing. Negative for hemoptysis and sputum production.   Cardiovascular: Negative for chest pain, palpitations, orthopnea and leg swelling.  Gastrointestinal: Negative for abdominal pain, blood Shelby stool, constipation, diarrhea, heartburn, melena, nausea and vomiting.  Genitourinary: Negative for dysuria, frequency and urgency.  Musculoskeletal: Negative for back pain.  Skin: Negative.  Negative for itching and rash.  Neurological: Negative for dizziness, tingling, focal weakness, weakness and headaches.  Endo/Heme/Allergies: Does not bruise/bleed easily.      PAST MEDICAL HISTORY :  Past Medical History:  Diagnosis Date  . Anemia   . Anxiety   . Breast cancer ([place]) 09/2016   right breast  . Bursitis of right shoulder   . Cancer [facility])    breast  . 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 Cataract, bilateral   . Chemotherapy induced nausea and vomiting   . Cold    states she has had it for 2 months  . Depression   . GERD (gastroesophageal reflux disease)    uses baking soda for symptoms  . Insomnia   . Lumbago   . Personal history of chemotherapy 11/22/2016  . Personal history of radiation therapy 01/20/2017  . Vitamin D deficiency     PAST SURGICAL HISTORY :   Past Surgical History:  Procedure Laterality Date  . ABDOMINAL HYSTERECTOMY  04/20/2014  . BILATERAL SALPINGECTOMY    . BREAST BIOPSY Right 09/27/2016   positive  . BREAST LUMPECTOMY Right 10/12/2016   positive  . BREAST LUMPECTOMY WITH SENTINEL LYMPH NODE BIOPSY Right 10/12/2016   Procedure: BREAST LUMPECTOMY WITH  SENTINEL LYMPH NODE BX;  Surgeon: Christene Lye, MD;  Location: [facility] OR[facility];  Service: General;  Laterality: Right;  . COLONOSCOPY WITH PROPOFOL N/A 05/20/2016   Procedure: COLONOSCOPY WITH PROPOFOL;  Surgeon: Lucilla Lame, MD;  Location: [facility];  Service: Endoscopy;  Laterality: N/A;  . ECTOPIC PREGNANCY SURGERY    .  ENDOMETRIAL ABLATION    . PORTACATH PLACEMENT Left 11/04/2016   Procedure: INSERTION PORT-A-CATH;  Surgeon: Christene Lye, MD;  Location: [facility];  Service: General;  Laterality: Left;  Left subclavian vein  . TUBAL LIGATION      FAMILY HISTORY :   Family History  Problem Relation Age of Onset  . Heart disease Mother   . Depression Father   . Obesity Father   . Diabetes Father   . Alcohol abuse Father   . COPD Father   . Alcohol abuse Brother   . Seizures Brother   . Breast cancer Sister 26    SOCIAL HISTORY:   Social History   Tobacco Use  . Smoking status: Former Smoker    Packs/day: 0.50    Years: 20.00    Pack years: 10.00    Types: Cigarettes    Quit date: 08/23/2016    Years since quitting: 3.9  . Smokeless tobacco: Never Used  Vaping Use  . Vaping Use: Never used  Substance Use Topics  . Alcohol use: Yes    Alcohol/week: 0.0 standard drinks    Comment: occassional  . Drug use: No    ALLERGIES:  is allergic to adhesive [tape] and nickel.  MEDICATIONS:  Current Outpatient Medications  Medication Sig Dispense Refill  . acetaminophen (TYLENOL) 325 MG tablet Take 325 mg by mouth every 6 (six) hours as needed for fever.    Marland Kitchen albuterol (VENTOLIN HFA) 108 (90 Base) MCG/ACT inhaler INHALE 2 PUFFS INTO Shelby LUNGS EVERY 6 HOURS AS NEEDED FOR WHEEZING OR SHORTNESS OF BREATH 8.5 g 2  . amphetamine-dextroamphetamine (ADDERALL XR) 20 MG 24 hr capsule Take 1 capsule (20 mg total) by mouth every morning. 30 capsule 0  . amphetamine-dextroamphetamine (ADDERALL XR) 20 MG 24 hr capsule Take 1 capsule (20 mg total) by mouth every morning. 30 capsule 0  . amphetamine-dextroamphetamine (ADDERALL XR) 20 MG 24 hr capsule Take 1 capsule (20 mg total) by mouth daily. 30 capsule 0  . Investigational aspirin/placebo 300 MG tablet Alliance [id number] Take 1 tablet by mouth daily. Take with food or a full glass of water.  Do not crush enteric coated tablets. 180 tablet 0  . loratadine  (CLARITIN) 10 MG tablet Take 1 tablet (10 mg total) by mouth daily. 30 tablet 2  . meloxicam (MOBIC) 15 MG tablet TAKE 1 TABLET BY MOUTH  DAILY 90 tablet 0  . Multiple Vitamin (MULTIVITAMIN WITH MINERALS) TABS tablet Take 1 tablet by mouth daily. One-A-Day    . omeprazole (PRILOSEC) 20 MG capsule TAKE 1 CAPSULE(20 MG) BY MOUTH DAILY 90 capsule 1   No current facility-administered medications for this visit.    PHYSICAL EXAMINATION: ECOG PERFORMANCE STATUS: 0 - Asymptomatic  There were no vitals taken for this visit.  There were no vitals filed for this visit.  Physical Exam HENT:     Head: Normocephalic and atraumatic.     Mouth/Throat:     Pharynx: No oropharyngeal exudate.  Eyes:     Pupils: Pupils are equal, round, and reactive to light.  Cardiovascular:     Rate and Rhythm: Normal rate and regular rhythm.  Pulmonary:     Effort: No respiratory distress.     Breath sounds: No wheezing.  Abdominal:     General: Bowel sounds are normal. There is no distension.     Palpations: Abdomen is soft. There is no mass.     Tenderness: There is no abdominal tenderness. There is no guarding or rebound.  Musculoskeletal:        General: No tenderness. Normal range of motion.     Cervical back: Normal range of motion and neck supple.  Skin:    General: Skin is warm.     Comments: Right and left BREAST exam (Shelby Shelby presence of nurse)- no unusual skin changes or dominant masses felt. Surgical scars noted.    Neurological:     Mental Status: She is alert and oriented to person, place, and time.  Psychiatric:        Mood and Affect: Affect normal.     LABORATORY DATA:  I have reviewed Shelby data as listed    Component Value Date/Time   NA 141 02/22/2020 1331   K 4.4 02/22/2020 1331   CL 101 02/22/2020 1331   CO2 23 02/22/2020 1331   GLUCOSE 93 02/22/2020 1331   GLUCOSE 100 (H) 08/08/2019 0957   BUN 12 02/22/2020 1331   CREATININE 0.73 02/22/2020 1331   CALCIUM 9.6 02/22/2020  1331   PROT 6.2 02/22/2020 1331   ALBUMIN 4.3 02/22/2020 1331   AST 22 02/22/2020 1331   ALT 23 02/22/2020 1331   ALKPHOS 107 02/22/2020 1331   BILITOT 0.3 02/22/2020 1331   GFRNONAA 94 02/22/2020 1331   GFRAA 108 02/22/2020 1331    No results found for: SPEP, UPEP  Lab Results  Component Value Date   WBC 6.8 02/22/2020   NEUTROABS 4.0 02/22/2020   HGB 13.8 02/22/2020   HCT 41.8 02/22/2020   MCV 86 02/22/2020   PLT 301 02/22/2020      Chemistry      Component Value Date/Time   NA 141 02/22/2020 1331   K 4.4 02/22/2020 1331   CL 101 02/22/2020 1331   CO2 23 02/22/2020 1331   BUN 12 02/22/2020 1331   CREATININE 0.73 02/22/2020 1331      Component Value Date/Time   CALCIUM 9.6 02/22/2020 1331   ALKPHOS 107 02/22/2020 1331   AST 22 02/22/2020 1331   ALT 23 02/22/2020 1331   BILITOT 0.3 02/22/2020 1331       RADIOGRAPHIC STUDIES: I have personally reviewed Shelby radiological images as listed and agreed with Shelby findings Shelby Shelby report. No results found.   ASSESSMENT & PLAN:  No problem-specific Assessment & Plan notes found for this encounter.   No orders of Shelby defined types were placed Shelby this encounter.  All questions were answered. Shelby patient knows to call Shelby clinic with any problems, questions or concerns.      Cammie Sickle, MD 08/20/2020 8:43 AM

## 2020-08-26 ENCOUNTER — Encounter: Payer: Self-pay | Admitting: Internal Medicine

## 2020-08-26 NOTE — Progress Notes (Signed)
Patient called for pre assessment. She denies pain or concerns at this time.  

## 2020-08-27 ENCOUNTER — Inpatient Hospital Stay: Payer: Managed Care, Other (non HMO) | Attending: Internal Medicine | Admitting: Internal Medicine

## 2020-08-27 ENCOUNTER — Other Ambulatory Visit: Payer: Self-pay

## 2020-08-27 ENCOUNTER — Encounter: Payer: Self-pay | Admitting: *Deleted

## 2020-08-27 DIAGNOSIS — Z170 Estrogen receptor positive status [ER+]: Secondary | ICD-10-CM | POA: Diagnosis not present

## 2020-08-27 DIAGNOSIS — M8580 Other specified disorders of bone density and structure, unspecified site: Secondary | ICD-10-CM | POA: Diagnosis not present

## 2020-08-27 DIAGNOSIS — C50511 Malignant neoplasm of lower-outer quadrant of right female breast: Secondary | ICD-10-CM | POA: Diagnosis not present

## 2020-08-27 NOTE — Research (Addendum)
BWEL 36 month visit. Shelby Barry presented to the clinic this morning for her 26 month follow up visit for the [id number] BWEL study. Her weight was checked twice with patient wearing light weight summer clothing and shoes removed. She weighed 167.2 lbs or 75.8kg both times, which is down about 20 lbs since she began the study. Her waist and hip circumference measurements were obtained twice each.  Her hip circumference was 108.0 cm both times and waist circumference was 92.0 and 91.0 cm respectively. BMI calculated at 31.1 today. Patient reports she is still trying to exercise more and has started lifting weights. She completed the 36 month BWEL study questionnaires while in clinic.  Dr. Rogue Bussing examined the patient and is ordering her annual mammogram in December.  She is no longer taking her Arimidex with reported symptoms resolved. Pedal edema has resolved, but patient reports occasional bruising continues and this is her norm. Solicited AEs assessed and she denies any fractures, sprains, tendon or ligament injuries or any orthopedic surgeries. Patient does reports some occasional indigestion for which she takes omeprazole. She also reports occasional vomiting (without nausea) and states this occurs after she eats/drinks dairy products.  She is scheduled to see Dr. Rogue Bussing again in 6 months and states her next mammogram is due in December. She denies questions or concerns about the study for this visit but was encouraged to call if any problems should arise. AEs and solicited AEs as follows:   Study/Protocol: [id number] BWEL Cycle: 36 months Event Grade Onset Date Resolved Date Drug Name Attribution Treatment Comments  Bruising 1 07/26/18   Aspirin/placebo possible  States she bruises easily  Insomnia 1 Medical History   unrelated Trazodone but not taking   Indigestion 1 Medical History   Unrelated omeprazole   Vomiting  1 unknown   unrelated  After eating dairy or fatty foods  Shelby Barry,  BSN, MHA, OCN 08/27/2020 11:29 AM

## 2020-08-27 NOTE — Assessment & Plan Note (Addendum)
#  Breast cancer-stage II ER/PR positive HER-2/neu negative; declines Enodcrine therapy sec to intolerance.STABLE. DEC Mammogram 2020-WNL.   # Continues to be in the ABC trial/also on trial involving lifestyle changes. Discussed with clinical trials nurse  #Osteopenia T score -2.1; continue calcium plus vitamin   # declines- flu shot.     # DISPOSITION: #mammogram dec 2021 # follow up in 6 months-MD; cbc/cmp-- Dr.B

## 2020-08-27 NOTE — Progress Notes (Signed)
[name] OFFICE PROGRESS NOTE  Patient Care Team: Steele Sizer, MD as PCP - General (Family Medicine) Christene Lye, MD (General Surgery) Steele Sizer, MD as Attending Physician (Family Medicine) Rico Junker, RN as Oncology Nurse Navigator Cammie Sickle, MD as Consulting Physician (Internal Medicine)  Cancer Staging No matching staging information was found for the patient.   Oncology History Overview Note  # DEC 2017- RIGHT BREAST Stage II [pT1cpN1sn; screening] ER/PR- Pos- 90%; her 2 NEU- NEG s/p Lumpect & SLNBx; Dr.Sankar ]; JAN 2018- ddAC -T x12. AUG 2nd [finished RT].  # AUG 21st 2018- Start TAM; Nov 2018- Stopped sec intol/mood swings; arthralgias];  DEC 2019- Arimidex 1 mg/day; STOPPED JAN 2020 sec to intolerarnce.   ---------------------------------------------------------  # MRI liver- hemagiomas  # Genetic counseling- DICER-1 VUS**  # TAH & BSO [Dr.Hall; [facility]; 2014]; smoking   DIAGNOSIS: BREAST CA  STAGE:   II      ;GOALS: cure  CURRENT/MOST RECENT THERAPY: surveillaince    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, estrogen receptor positive ([place])      INTERVAL HISTORY:  Shelby Barry 54 y.o.  female pleasant patient above history of stage II breast cancer ER PR positive currently NOT on adjuvant endocrine therapy [sec to intolerance] is in for follow-up/ ABC-be well clinical studies.   Patient states her joint pains fatigue heart pressure is improved since she stopped taking her endocrine therapy.   She denies any new onset of cough or shortness of breath or fevers or chills.  Review of Systems  Constitutional: Positive for malaise/fatigue. Negative for chills, diaphoresis, fever and weight loss.  HENT: Negative for nosebleeds and sore throat.   Eyes: Negative for double vision.  Respiratory: Negative for hemoptysis and sputum production.   Cardiovascular: Negative for chest  pain, palpitations, orthopnea and leg swelling.  Gastrointestinal: Negative for abdominal pain, blood in stool, constipation, diarrhea, heartburn, melena, nausea and vomiting.  Genitourinary: Negative for dysuria, frequency and urgency.  Musculoskeletal: Negative for back pain.  Skin: Negative.  Negative for itching and rash.  Neurological: Negative for dizziness, tingling, focal weakness, weakness and headaches.  Endo/Heme/Allergies: Does not bruise/bleed easily.      PAST MEDICAL HISTORY :  Past Medical History:  Diagnosis Date  . Anemia   . Anxiety   . Breast cancer ([place]) 09/2016   right breast  . Bursitis of right shoulder   . Cancer [facility])    breast  . 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, estrogen receptor positive ([place])   . Cataract, bilateral   . Chemotherapy induced nausea and vomiting   . Cold    states she has had it for 2 months  . Depression   . GERD (gastroesophageal reflux disease)    uses baking soda for symptoms  . Insomnia   . Lumbago   . Personal history of chemotherapy 11/22/2016  . Personal history of radiation therapy 01/20/2017  . Vitamin D deficiency     PAST SURGICAL HISTORY :   Past Surgical History:  Procedure Laterality Date  . ABDOMINAL HYSTERECTOMY  04/20/2014  . BILATERAL SALPINGECTOMY    . BREAST BIOPSY Right 09/27/2016   positive  . BREAST LUMPECTOMY Right 10/12/2016   positive  . BREAST LUMPECTOMY WITH SENTINEL LYMPH NODE BIOPSY Right 10/12/2016   Procedure: BREAST LUMPECTOMY WITH SENTINEL LYMPH NODE BX;  Surgeon: Christene Lye, MD;  Location: [facility];  Service: General;  Laterality:  Right;  Marland Kitchen COLONOSCOPY WITH PROPOFOL N/A 05/20/2016   Procedure: COLONOSCOPY WITH PROPOFOL;  Surgeon: Lucilla Lame, MD;  Location: [facility];  Service: Endoscopy;  Laterality: N/A;  . ECTOPIC PREGNANCY SURGERY    . ENDOMETRIAL ABLATION    . PORTACATH PLACEMENT Left 11/04/2016   Procedure: INSERTION PORT-A-CATH;   Surgeon: Christene Lye, MD;  Location: [facility];  Service: General;  Laterality: Left;  Left subclavian vein  . TUBAL LIGATION      FAMILY HISTORY :   Family History  Problem Relation Age of Onset  . Heart disease Mother   . Depression Father   . Obesity Father   . Diabetes Father   . Alcohol abuse Father   . COPD Father   . Alcohol abuse Brother   . Seizures Brother   . Breast cancer Sister 46    SOCIAL HISTORY:   Social History   Tobacco Use  . Smoking status: Former Smoker    Packs/day: 0.50    Years: 20.00    Pack years: 10.00    Types: Cigarettes    Quit date: 08/23/2016    Years since quitting: 4.2  . Smokeless tobacco: Never Used  Vaping Use  . Vaping Use: Never used  Substance Use Topics  . Alcohol use: Yes    Alcohol/week: 0.0 standard drinks    Comment: occassional  . Drug use: No    ALLERGIES:  is allergic to adhesive [tape] and nickel.  MEDICATIONS:  Current Outpatient Medications  Medication Sig Dispense Refill  . acetaminophen (TYLENOL) 325 MG tablet Take 325 mg by mouth every 6 (six) hours as needed for fever.    Marland Kitchen albuterol (VENTOLIN HFA) 108 (90 Base) MCG/ACT inhaler INHALE 2 PUFFS INTO THE LUNGS EVERY 6 HOURS AS NEEDED FOR WHEEZING OR SHORTNESS OF BREATH 8.5 g 2  . amphetamine-dextroamphetamine (ADDERALL XR) 20 MG 24 hr capsule Take 1 capsule (20 mg total) by mouth every morning. 30 capsule 0  . amphetamine-dextroamphetamine (ADDERALL XR) 20 MG 24 hr capsule Take 1 capsule (20 mg total) by mouth every morning. 30 capsule 0  . amphetamine-dextroamphetamine (ADDERALL XR) 20 MG 24 hr capsule Take 1 capsule (20 mg total) by mouth daily. 30 capsule 0  . Investigational aspirin/placebo 300 MG tablet Alliance [id number] Take 1 tablet by mouth daily. Take with food or a full glass of water.  Do not crush enteric coated tablets. (Patient not taking: Reported on 10/22/2020) 180 tablet 0  . loratadine (CLARITIN) 10 MG tablet Take 1 tablet (10 mg total) by  mouth daily. 30 tablet 2  . meloxicam (MOBIC) 15 MG tablet TAKE 1 TABLET BY MOUTH  DAILY 90 tablet 0  . Multiple Vitamin (MULTIVITAMIN WITH MINERALS) TABS tablet Take 1 tablet by mouth daily. One-A-Day    . omeprazole (PRILOSEC) 20 MG capsule TAKE 1 CAPSULE(20 MG) BY MOUTH DAILY 90 capsule 0   No current facility-administered medications for this visit.    PHYSICAL EXAMINATION: ECOG PERFORMANCE STATUS: 0 - Asymptomatic  BP (!) 142/81 (Patient Position: Sitting)   Pulse 79   Temp (!) 97 F (36.1 C) (Tympanic)   Resp 20   Ht 5' 1.5" (1.562 m)   Wt 174 lb 14.4 oz (79.3 kg)   SpO2 97% Comment: room air  BMI 32.51 kg/m   Filed Weights   08/27/20 1023  Weight: 174 lb 14.4 oz (79.3 kg)    Physical Exam HENT:     Head: Normocephalic and atraumatic.  Mouth/Throat:     Pharynx: No oropharyngeal exudate.  Eyes:     Pupils: Pupils are equal, round, and reactive to light.  Cardiovascular:     Rate and Rhythm: Normal rate and regular rhythm.  Pulmonary:     Effort: No respiratory distress.     Breath sounds: No wheezing.  Abdominal:     General: Bowel sounds are normal. There is no distension.     Palpations: Abdomen is soft. There is no mass.     Tenderness: There is no abdominal tenderness. There is no guarding or rebound.  Musculoskeletal:        General: No tenderness. Normal range of motion.     Cervical back: Normal range of motion and neck supple.  Skin:    General: Skin is warm.     Comments: Right and left BREAST exam (in the presence of nurse)- no unusual skin changes or dominant masses felt. Surgical scars noted.    Neurological:     Mental Status: She is alert and oriented to person, place, and time.  Psychiatric:        Mood and Affect: Affect normal.     LABORATORY DATA:  I have reviewed the data as listed    Component Value Date/Time   NA 141 02/22/2020 1331   K 4.4 02/22/2020 1331   CL 101 02/22/2020 1331   CO2 23 02/22/2020 1331   GLUCOSE 93  02/22/2020 1331   GLUCOSE 100 (H) 08/08/2019 0957   BUN 12 02/22/2020 1331   CREATININE 0.73 02/22/2020 1331   CALCIUM 9.6 02/22/2020 1331   PROT 6.2 02/22/2020 1331   ALBUMIN 4.3 02/22/2020 1331   AST 22 02/22/2020 1331   ALT 23 02/22/2020 1331   ALKPHOS 107 02/22/2020 1331   BILITOT 0.3 02/22/2020 1331   GFRNONAA 94 02/22/2020 1331   GFRAA 108 02/22/2020 1331    No results found for: SPEP, UPEP  Lab Results  Component Value Date   WBC 6.8 02/22/2020   NEUTROABS 4.0 02/22/2020   HGB 13.8 02/22/2020   HCT 41.8 02/22/2020   MCV 86 02/22/2020   PLT 301 02/22/2020      Chemistry      Component Value Date/Time   NA 141 02/22/2020 1331   K 4.4 02/22/2020 1331   CL 101 02/22/2020 1331   CO2 23 02/22/2020 1331   BUN 12 02/22/2020 1331   CREATININE 0.73 02/22/2020 1331      Component Value Date/Time   CALCIUM 9.6 02/22/2020 1331   ALKPHOS 107 02/22/2020 1331   AST 22 02/22/2020 1331   ALT 23 02/22/2020 1331   BILITOT 0.3 02/22/2020 1331       RADIOGRAPHIC STUDIES: I have personally reviewed the radiological images as listed and agreed with the findings in the report. No results found.   ASSESSMENT & PLAN:  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, estrogen receptor positive ([place]) # Breast cancer-stage II ER/PR positive HER-2/neu negative; declines Enodcrine therapy sec to intolerance.STABLE. DEC Mammogram 2020-WNL.   # Continues to be in the ABC trial/also on trial involving lifestyle changes. Discussed with clinical trials nurse  #Osteopenia T score -2.1; continue calcium plus vitamin   # declines- flu shot.     # DISPOSITION: #mammogram dec 2021 # follow up in 6 months-MD; cbc/cmp-- Dr.B   Orders Placed This Encounter  Procedures  . MM DIAG BREAST TOMO BILATERAL    Standing Status:   Future    Number of Occurrences:  1    Standing Expiration Date:   08/27/2021    Order Specific Question:   Reason for Exam (SYMPTOM  OR DIAGNOSIS  REQUIRED)    Answer:   hx of breast cancer    Order Specific Question:   Is the patient pregnant?    Answer:   No    Order Specific Question:   Preferred imaging location?    Answer:   [facility]  . CBC with Differential/Platelet    Standing Status:   Future    Standing Expiration Date:   08/27/2021  . Comprehensive metabolic panel    Standing Status:   Future    Standing Expiration Date:   08/27/2021   All questions were answered. The patient knows to call the clinic with any problems, questions or concerns.      Cammie Sickle, MD 12/09/2020 3:24 PM

## 2020-08-27 NOTE — Research (Signed)
36 month visit for [facility] [id number] Shelby Barry presented to the clinic this morning for her 36 month visit for the [id number] ABC study. Patient returned her old bottle of Aspirin/placebo, but states she forgot to bring her medication calendars with her. Stressed to her that we need those as well and she states she will drop them off for [name] this week. She was dispensed a new bottle of 300mg  Aspirin/Placebo, #200 with the patient specific bottle labeled  With #[id number] - verified by Andria Rhein and myself before dispensing to Shelby Barry. Patient returned #44 tablets in her old pill bottle and in order to avoid any confusion, this was not returned to her since it will expire on 10/21/20. She was provided new drug diaries to last her through March, 2022. Patient denies having taken any Aspirin, Ibuprofen, Aleve or any other NSAID during the past 6 months and was reminded to try to avoid these if possible while she is on study. Solicited AE's reviewed with patient and she reports some mild, scattered bruising, which she states is normal for her. She has a small bruise on her left upper arm and another faint bruise on her leg. She denies any other bleeding events. She was assessed in the clinic by Dr. Rogue Bussing today with no new complaints. B/P was 142/81 and HR 79. Her weight continues to trend down and was 75.84kg. Her next mammogram will be scheduled for December this year. Shelby Barry was instructed to call if she has any questions/concerns. AEs and Solicited AEs are as follows:   Study/Protocol: [id number] ABC Cycle: 36 months Event Grade Onset Date Resolved Date Drug Name Attribution Treatment Comments  Bruising *Solicited AE 1 05/29/2422  Aspirin/placebo possible none Reports this is her norm  Insomnia 1 Medical history   unrelated Trazodone but not taking   Vomiting 1 unknown   unrelated  After eating dairy or fatty foods  Indigestion 1 Medical History   Unrelated omeprazole Has prilosec but not taking.   Yolande Jolly, BSN, MHA, OCN 08/27/2020 11:08 AM

## 2020-09-04 ENCOUNTER — Telehealth: Payer: Self-pay

## 2020-09-04 NOTE — Telephone Encounter (Signed)
Call placed to patient to remind her to return her [id number] medication diaries for the protocol from the last cycle she completed. The patient was thankful for the reminder call as she had forgotten and states she will bring them by the cancer center either today or tomorrow.  [name], RN, BSN, OCN Date: 09/04/2020 Time: 1200 pm

## 2020-10-03 ENCOUNTER — Other Ambulatory Visit: Payer: Self-pay | Admitting: Family Medicine

## 2020-10-03 ENCOUNTER — Telehealth: Payer: Self-pay

## 2020-10-03 DIAGNOSIS — K219 Gastro-esophageal reflux disease without esophagitis: Secondary | ICD-10-CM

## 2020-10-03 NOTE — Telephone Encounter (Signed)
Call placed to patient this morning. Reminded patient to bring in her medication IP diaries from her last visit. She states she just found them yesterday and plans to bring them in today. Research nurse thanked the patient for being flexible to bring them to the clinic.  [name], RN, BSN, OCN Date: 10/03/20 Time: 10:56 AM

## 2020-10-06 ENCOUNTER — Other Ambulatory Visit: Payer: Self-pay | Admitting: Family Medicine

## 2020-10-06 DIAGNOSIS — F902 Attention-deficit hyperactivity disorder, combined type: Secondary | ICD-10-CM

## 2020-10-06 NOTE — Telephone Encounter (Signed)
Medication Refill - Medication: Adderall  Has the patient contacted their pharmacy? No. (Agent: If no, request that the patient contact the pharmacy for the refill.) (Agent: If yes, when and what did the pharmacy advise?)  Preferred Pharmacy (with phone number or street name): [id number] [address], [place] AT [facility]  Agent: Please be advised that RX refills may take up to 3 business days. We ask that you follow-up with your pharmacy.

## 2020-10-06 NOTE — Telephone Encounter (Signed)
Please review for refill. Refill not delegated per protocol.  Last refill: 09/11/20 #30 Next OV: 10/14/20

## 2020-10-10 ENCOUNTER — Other Ambulatory Visit: Payer: Self-pay

## 2020-10-10 DIAGNOSIS — F902 Attention-deficit hyperactivity disorder, combined type: Secondary | ICD-10-CM

## 2020-10-10 NOTE — Telephone Encounter (Signed)
Pt called and is requesting to have nurse or PCP call back to explain this. Pt states that she has not had medication refilled but one time since last visit. Please advise.

## 2020-10-14 ENCOUNTER — Ambulatory Visit: Payer: Managed Care, Other (non HMO) | Admitting: Family Medicine

## 2020-10-22 ENCOUNTER — Telehealth: Payer: Self-pay

## 2020-10-22 DIAGNOSIS — Z170 Estrogen receptor positive status [ER+]: Secondary | ICD-10-CM

## 2020-10-22 DIAGNOSIS — C50511 Malignant neoplasm of lower-outer quadrant of right female breast: Secondary | ICD-10-CM

## 2020-10-22 NOTE — Telephone Encounter (Cosign Needed)
[id number] "ABC" Study Discontinuation Notification:  Research RN called patient and informed her of the study being discontinued due to futility of results, they had found no difference in patients taking study drug / placebo in regards to preventing cancer coming back than any other patients not taking the study drug. The patient was informed to stop taking her study drug at this time. The study will continue to monitor her at specified scheduled visits until further notice. The patient was informed to continue taking any current cancer medications and continue follow up with her doctor. Encouraged the patient to bring in any leftover study drug to the research department along with any remaining medication logs that she has. The patient expressed her understanding and did not have any questions concerning the information provided to her. She knows that she can return her study drug at the next appointment scheduled if that would be more convenient for her.  [name] 10/22/20 1:36 PM

## 2020-11-11 ENCOUNTER — Inpatient Hospital Stay: Admission: RE | Admit: 2020-11-11 | Payer: Managed Care, Other (non HMO) | Source: Ambulatory Visit

## 2020-11-13 ENCOUNTER — Other Ambulatory Visit: Payer: Self-pay

## 2020-11-13 ENCOUNTER — Ambulatory Visit
Admission: RE | Admit: 2020-11-13 | Discharge: 2020-11-13 | Disposition: A | Discharge status: Home / Self Care | Payer: Managed Care, Other (non HMO) | Source: Ambulatory Visit | Attending: Internal Medicine | Admitting: Internal Medicine

## 2020-11-13 DIAGNOSIS — Z170 Estrogen receptor positive status [ER+]: Secondary | ICD-10-CM | POA: Diagnosis present

## 2020-11-13 DIAGNOSIS — C50511 Malignant neoplasm of lower-outer quadrant of right female breast: Secondary | ICD-10-CM | POA: Diagnosis present

## 2020-12-15 NOTE — Progress Notes (Signed)
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  She was started on Adderal XR 15 mg earlier this year,but is not on 20 mg because initial dose was not lasting all day, currently lasts until about 4 , but still able to focus while at home in the evening. No affecting her sleep No side effects of medications such palpitation, but she has noticed lack of appetite in the middle of the day , she lost another 16 lbs since last visit, she is not sure if due to depression.   Major depression chronic recurrent: she has a long history of depression over the years, her father committed suicide when she was in her 4's. She has been very overwhelmed. Broke up with her long term relationship, now his daughter her boyfriend and dogs moved in with her. She also feels pressured at work. Mother also moved in with her. She does not have any respite.Marland Kitchen always has something to do. She may need to take her mother back to nursing home. She has noticed crying spells again. Discussed anti-depressants. She took zoloft many years ago she thinks it made her feel numb. We will try low dose lexapro   History of breast cancer: still seeing hematologist, stopped anastrozole months ago, tried Tamoxifen for about 6 months but also stopped in 2019 on her own . She is up to date with mammogram, last mammogram was 11/21/2020   GERD: she has been taking Omeprazole daily, she states she has heartburn and epigastric pain if she skips one dose of medication. She is on the  lower dose of Omeprazole and symptoms are controlled    Chronic bronchitis/asthma: she still has a cough a couple of times a week, but usually when eating - shocking like, she quit smoking in 2017.  She states no recent episodes of sob or wheezing.   Right ear crackling: going on for the past few weeks, no hearing loss, no pain, no fever or chills.    Patient Active Problem List   Diagnosis Date Noted  . Obesity (BMI 30.0-34.9) 05/18/2018  . Liver lesion 11/09/2016  . Chronic bronchitis ([place]) 10/29/2016  . 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 Recurrent major depression-severe ([place]) 08/22/2015  . Bursitis of shoulder 08/22/2015  . Bilateral cataracts 08/22/2015  . Circadian rhythm sleep disorder, 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  . Vitamin D deficiency 08/22/2015  . LBP (low back pain) 08/22/2015  . GAD (generalized anxiety disorder) 08/22/2015  . History of anemia 08/17/2007  . Insomnia 08/16/2007    Past Surgical History:  Procedure Laterality Date  . ABDOMINAL HYSTERECTOMY  04/20/2014  . BILATERAL SALPINGECTOMY    . BREAST BIOPSY Right 09/27/2016   positive  . BREAST LUMPECTOMY Right 10/12/2016   positive  . BREAST LUMPECTOMY WITH SENTINEL LYMPH NODE BIOPSY Right 10/12/2016   Procedure: BREAST LUMPECTOMY WITH SENTINEL LYMPH NODE BX;  Surgeon: Christene Lye, MD;  Location: [facility];  Service: General;  Laterality: Right;  . COLONOSCOPY WITH PROPOFOL N/A 05/20/2016   Procedure: COLONOSCOPY WITH PROPOFOL;  Surgeon: Lucilla Lame, MD;  Location: [facility];  Service: Endoscopy;  Laterality: N/A;  . ECTOPIC PREGNANCY SURGERY    . ENDOMETRIAL ABLATION    . PORTACATH PLACEMENT Left 11/04/2016   Procedure: INSERTION PORT-A-CATH;  Surgeon: Christene Lye, MD;  Location: [facility];  Service: General;  Laterality: Left;  Left subclavian vein  . TUBAL LIGATION      Family History  Problem Relation  Age of Onset  . Heart disease Mother   . Depression Father   . Obesity Father   . Diabetes Father   . Alcohol abuse Father   . COPD Father   . Alcohol abuse Brother   . Seizures Brother   . Breast cancer Sister 52    Social History   Tobacco Use  . Smoking status: Former Smoker    Packs/day: 0.50    Years: 20.00    Pack years: 10.00    Types: Cigarettes    Quit date: 08/23/2016    Years since quitting: 4.3  . Smokeless tobacco: Never Used  Substance Use Topics  . Alcohol use: Yes    Alcohol/week: 0.0 standard drinks    Comment: occassional     Current Outpatient Medications:  .  acetaminophen (TYLENOL) 325 MG tablet, Take 325 mg by mouth every 6 (six) hours as needed for fever., Disp: , Rfl:  .  albuterol (VENTOLIN HFA) 108 (90 Base) MCG/ACT inhaler, INHALE 2 PUFFS INTO THE LUNGS EVERY 6 HOURS AS NEEDED FOR WHEEZING OR SHORTNESS OF BREATH, Disp: 8.5 g, Rfl: 2 .  loratadine (CLARITIN) 10 MG tablet, Take 1 tablet (10 mg total) by mouth daily., Disp: 30 tablet, Rfl: 2 .  meloxicam (MOBIC) 15 MG tablet, TAKE 1 TABLET BY MOUTH  DAILY, Disp: 90 tablet, Rfl: 0 .  Multiple Vitamin (MULTIVITAMIN WITH MINERALS) TABS tablet, Take 1 tablet by mouth daily. One-A-Day, Disp: , Rfl:  .  amphetamine-dextroamphetamine (ADDERALL XR) 20 MG 24 hr capsule, Take 1 capsule (20 mg total) by mouth every morning., Disp: 30 capsule, Rfl: 0 .  amphetamine-dextroamphetamine (ADDERALL XR) 20 MG 24 hr capsule, Take 1 capsule (20 mg total) by mouth every morning., Disp: 30 capsule, Rfl: 0 .  amphetamine-dextroamphetamine (ADDERALL XR) 20 MG 24 hr capsule, Take 1 capsule (20 mg total) by mouth daily., Disp: 30 capsule, Rfl: 0 .  omeprazole (PRILOSEC) 20 MG capsule, Take 1 capsule (20 mg total) by mouth daily., Disp: 90 capsule, Rfl: 1  Allergies  Allergen Reactions  . Adhesive [Tape] Other (See Comments)    Blisters/rash  PLEASE USE PAPER TAPE ONLY  . Nickel     PT CAN WEAR GOLD, STERLING SILVER WITH NO  PROBLEM-PT HAS NEVER HAD ANY PROBLEMS IN THE OR    I personally reviewed active problem list, medication list, allergies, family history, social history, health maintenance with the patient/caregiver today.   ROS  Constitutional: Negative for fever, positive for  weight change.  Respiratory: Negative for cough and shortness of breath.   Cardiovascular: Negative for chest pain or palpitations.  Gastrointestinal: positive for intermittent right lower abdominal pain,  no bowel changes.  Musculoskeletal: Negative for gait problem or joint swelling.  Skin: Negative for rash.  Neurological: Negative for dizziness or headache.  No other specific complaints in a complete review of systems (except as listed in HPI above).  Objective  Vitals:  12/16/20 0846  BP: 138/74  Pulse: 81  Resp: 16  Temp: 97.7 F (36.5 C)  TempSrc: Oral  SpO2: 96%  Weight: 163 lb 12.8 oz (74.3 kg)  Height: 5\' 2"  (1.575 m)    Body mass index is 29.96 kg/m.  Physical Exam  Constitutional: Patient appears well-developed and well-nourished. Overweight. No distress.  HEENT: head atraumatic, normocephalic, pupils equal and reactive to light,  neck supple Cardiovascular: Normal rate, regular rhythm and normal heart sounds.  No murmur heard. No BLE edema. Pulmonary/Chest: Effort normal and breath sounds normal. No respiratory distress. Abdominal: Soft.  There is tenderness near her right iliac crest , no pelvic pain during palpation, she wants to hold off on evaluation for now, she will discuss it with oncologist in April  Psychiatric: Patient has a normal mood and affect. behavior is normal. Judgment and thought content normal.  PHQ2/9: Depression screen [facility] 2/9 12/16/2020 07/14/2020 04/08/2020 02/27/2020 10/09/2019  Decreased Interest 0 1 0 1 0  Down, Depressed, Hopeless 0 1 0 0 0  PHQ - 2 Score 0 2 0 1 0  Altered sleeping - 1 0 2 0  Tired, decreased energy - 1 0 1 1  Change in appetite - 0 0 0 0  Feeling bad  or failure about yourself  - 0 0 0 0  Trouble concentrating - 0 0 3 3  Moving slowly or fidgety/restless - 0 0 0 0  Suicidal thoughts - 0 0 0 0  PHQ-9 Score - 4 0 7 4  Difficult doing work/chores - Not difficult at all Not difficult at all Not difficult at all Very difficult    phq 9 is negative   Fall Risk: Fall Risk  12/16/2020 07/14/2020 04/08/2020 02/27/2020 10/09/2019  Falls in the past year? 0 0 0 0 0  Number falls in past yr: 0 0 0 0 0  Injury with Fall? 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 Attention deficit hyperactivity disorder (ADHD), combined type  - amphetamine-dextroamphetamine (ADDERALL XR) 20 MG 24 hr capsule; Take 1 capsule (20 mg total) by mouth every morning.  Dispense: 30 capsule; Refill: 0 - amphetamine-dextroamphetamine (ADDERALL XR) 20 MG 24 hr capsule; Take 1 capsule (20 mg total) by mouth every morning.  Dispense: 30 capsule; Refill: 0 - amphetamine-dextroamphetamine (ADDERALL XR) 20 MG 24 hr capsule; Take 1 capsule (20 mg total) by mouth daily.  Dispense: 30 capsule; Refill: 0  3. Major depression, recurrent, chronic (HCC)  - escitalopram (LEXAPRO) 10 MG tablet; Take 0.5-1 tablets (5-10 mg total) by mouth daily.  Dispense: 30 tablet; Refill: 0  4. Vitamin D deficiency   5. Gastroesophageal reflux disease without esophagitis  - omeprazole (PRILOSEC) 20 MG capsule; Take 1 capsule (20 mg total) by mouth daily.   Dispense: 90 capsule; Refill: 1  6. Eustachian tube dysfunction, right  - fluticasone (FLONASE) 50 MCG/ACT nasal spray; Place 2 sprays into both nostrils daily.  Dispense: 16  g; Refill: 1

## 2020-12-16 ENCOUNTER — Ambulatory Visit: Payer: Managed Care, Other (non HMO) | Admitting: Family Medicine

## 2020-12-16 ENCOUNTER — Other Ambulatory Visit: Payer: Self-pay

## 2020-12-16 ENCOUNTER — Other Ambulatory Visit: Payer: Self-pay | Admitting: Family Medicine

## 2020-12-16 ENCOUNTER — Encounter: Payer: Self-pay | Admitting: Family Medicine

## 2020-12-16 VITALS — BP 138/74 | HR 81 | Temp 97.7°F | Resp 16 | Ht 62.0 in | Wt 163.8 lb

## 2020-12-16 DIAGNOSIS — F339 Major depressive disorder, recurrent, unspecified: Secondary | ICD-10-CM

## 2020-12-16 DIAGNOSIS — K219 Gastro-esophageal reflux disease without esophagitis: Secondary | ICD-10-CM

## 2020-12-16 DIAGNOSIS — H6981 Other specified disorders of Eustachian tube, right ear: Secondary | ICD-10-CM

## 2020-12-16 DIAGNOSIS — F902 Attention-deficit hyperactivity disorder, combined type: Secondary | ICD-10-CM

## 2020-12-16 DIAGNOSIS — E559 Vitamin D deficiency, unspecified: Secondary | ICD-10-CM

## 2020-12-16 DIAGNOSIS — J449 Chronic obstructive pulmonary disease, unspecified: Secondary | ICD-10-CM | POA: Diagnosis not present

## 2020-12-16 MED ORDER — ESCITALOPRAM OXALATE 10 MG PO TABS: 5.0000 mg | ORAL_TABLET | Freq: Every day | ORAL | 0 refills | Status: DC

## 2020-12-16 MED ORDER — FLUTICASONE PROPIONATE 50 MCG/ACT NA SUSP: 2.0000 | Freq: Every day | NASAL | 1 refills | Status: DC

## 2020-12-16 MED ORDER — AMPHETAMINE-DEXTROAMPHET ER 20 MG PO CP24: 20.0000 mg | ORAL_CAPSULE | Freq: Every day | ORAL | 0 refills | Status: DC

## 2020-12-16 MED ORDER — AMPHETAMINE-DEXTROAMPHET ER 20 MG PO CP24: 20.0000 mg | ORAL_CAPSULE | ORAL | 0 refills | Status: DC

## 2020-12-16 MED ORDER — OMEPRAZOLE 20 MG PO CPDR: 20.0000 mg | DELAYED_RELEASE_CAPSULE | Freq: Every day | ORAL | 1 refills | Status: DC

## 2020-12-16 NOTE — Telephone Encounter (Signed)
    Notes to clinic:  Requesting a 90 day supply   Requested Prescriptions  Pending Prescriptions Disp Refills   fluticasone (FLONASE) 50 MCG/ACT nasal spray [Pharmacy Med Name: FLUTICASONE 50MCG NASAL SP (120) RX] 48 g     Sig: SHAKE LIQUID AND USE 2 SPRAYS IN EACH NOSTRIL DAILY      Ear, Nose, and Throat: Nasal Preparations - Corticosteroids Passed - 12/16/2020  9:32 AM      Passed - Valid encounter within last 12 months    Recent Outpatient Visits           Today COPD with asthma [facility])   [facility] Steele Sizer, MD   5 months ago GERD without esophagitis   [facility] Steele Sizer, MD   8 months ago Attention deficit hyperactivity disorder (ADHD), combined type   [facility] Steele Sizer, MD   9 months ago Attention deficit hyperactivity disorder (ADHD), combined type   [facility] Steele Sizer, MD   1 year ago Gastroesophageal reflux disease without esophagitis   [facility] Steele Sizer, MD       Future Appointments             In 3 months Ancil Boozer, Drue Stager, MD [facility], [facility]

## 2021-01-05 ENCOUNTER — Other Ambulatory Visit: Payer: Self-pay | Admitting: Family Medicine

## 2021-01-05 DIAGNOSIS — K219 Gastro-esophageal reflux disease without esophagitis: Secondary | ICD-10-CM

## 2021-01-13 ENCOUNTER — Other Ambulatory Visit: Payer: Self-pay | Admitting: Family Medicine

## 2021-01-13 DIAGNOSIS — F339 Major depressive disorder, recurrent, unspecified: Secondary | ICD-10-CM

## 2021-02-25 ENCOUNTER — Other Ambulatory Visit: Payer: Managed Care, Other (non HMO)

## 2021-02-25 ENCOUNTER — Ambulatory Visit: Payer: Managed Care, Other (non HMO) | Admitting: Internal Medicine

## 2021-03-16 NOTE — Progress Notes (Signed)
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  She was started on Adderal XR 15 mg but it was wearing off too early , now on 20 mg and lasts until about 4 pm . No side effects of medications such palpitation , but her heart rate is up today. She always had problems falling asleep, took Ambien in the past and mind is busy, we will try Temazepam   Major depression chronic recurrent: she has a long history of depression over the years, her father committed suicide when she was in her 41's. She has been very overwhelmed. Broke up with her long term relationship, now his daughter her boyfriend and dogs moved in with her. She also feels pressured at work. She placed her mother in the nursing home, her brother in jail, but still worries at night. She states lexapro 10 mg caused her to be too sleepy, she is only taking half pill daily, we will decrease the dose for now   History of breast cancer: still seeing hematologist, stopped anastrozole months ago, tried Tamoxifen for about 6 months but also stopped in 2019 on her own . She is up to date with mammogram, last mammogram was 11/21/2020 , she will go back for follow up in June  GERD: she has been taking Omeprazole daily, she states she has heartburn and epigastric pain if she skips one dose of medication. She is on the lower dose of Omeprazole and symptoms are controlled  She needs a refill   Chronic bronchitis/asthma: she still has a cough a couple of times a week, but usually when eating - chokes  at times,  she quit smoking in 2017.  She states no recent episodes of sob or wheezing. She uses Ventolin prn only ata most once a week   Patient Active Problem List   Diagnosis Date Noted  . Obesity (BMI 30.0-34.9) 05/18/2018  . Liver lesion 11/09/2016  . Chronic bronchitis ([place]) 10/29/2016  . 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 Recurrent major depression-severe ([place]) 08/22/2015  . Bursitis of shoulder 08/22/2015  . Bilateral cataracts 08/22/2015  . Circadian rhythm sleep disorder, 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  . Vitamin D deficiency 08/22/2015  . LBP (low back pain) 08/22/2015  . GAD (generalized anxiety disorder) 08/22/2015  . History of anemia 08/17/2007  . Insomnia 08/16/2007    Past Surgical History:  Procedure Laterality Date  . ABDOMINAL HYSTERECTOMY  04/20/2014  . BILATERAL SALPINGECTOMY    . BREAST BIOPSY Right 09/27/2016   positive  . BREAST LUMPECTOMY Right 10/12/2016   positive  . BREAST LUMPECTOMY WITH SENTINEL LYMPH NODE BIOPSY Right 10/12/2016   Procedure: BREAST LUMPECTOMY WITH SENTINEL LYMPH NODE BX;  Surgeon: Christene Lye, MD;  Location: [facility];  Service: General;  Laterality: Right;  . COLONOSCOPY WITH PROPOFOL N/A 05/20/2016   Procedure: COLONOSCOPY WITH PROPOFOL;  Surgeon: Lucilla Lame, MD;  Location: [place];  Service: Endoscopy;  Laterality: N/A;  . ECTOPIC PREGNANCY SURGERY    . ENDOMETRIAL ABLATION    . PORTACATH PLACEMENT Left 11/04/2016   Procedure: INSERTION PORT-A-CATH;  Surgeon: Christene Lye, MD;  Location: [facility];  Service: General;  Laterality: Left;  Left subclavian vein  . TUBAL LIGATION      Family History  Problem Relation Age of Onset  . Heart disease Mother   . Depression Father   . Obesity Father   . Diabetes Father   . Alcohol abuse Father   . COPD Father   . Alcohol abuse Brother   . Seizures Brother   . Breast cancer Sister 67     Social History   Tobacco Use  . Smoking status: Former Smoker    Packs/day: 0.50    Years: 20.00    Pack years: 10.00    Types: Cigarettes    Quit date: 08/23/2016    Years since quitting: 4.5  . Smokeless tobacco: Never Used  Substance Use Topics  . Alcohol use: Yes    Alcohol/week: 0.0 standard drinks    Comment: occassional     Current Outpatient Medications:  .  acetaminophen (TYLENOL) 325 MG tablet, Take 325 mg by mouth every 6 (six) hours as needed for fever., Disp: , Rfl:  .  albuterol (VENTOLIN HFA) 108 (90 Base) MCG/ACT inhaler, INHALE 2 PUFFS INTO THE LUNGS EVERY 6 HOURS AS NEEDED FOR WHEEZING OR SHORTNESS OF BREATH, Disp: 8.5 g, Rfl: 2 .  amphetamine-dextroamphetamine (ADDERALL XR) 20 MG 24 hr capsule, Take 1 capsule (20 mg total) by mouth every morning., Disp: 30 capsule, Rfl: 0 .  amphetamine-dextroamphetamine (ADDERALL XR) 20 MG 24 hr capsule, Take 1 capsule (20 mg total) by mouth every morning., Disp: 30 capsule, Rfl: 0 .  amphetamine-dextroamphetamine (ADDERALL XR) 20 MG 24 hr capsule, Take 1 capsule (20 mg total) by mouth daily., Disp: 30 capsule, Rfl: 0 .  escitalopram (LEXAPRO) 10 MG tablet, TAKE 1/2 TO 1 TABLET(5 TO 10 MG) BY MOUTH DAILY, Disp: 90 tablet, Rfl: 0 .  fluticasone (FLONASE) 50 MCG/ACT nasal spray, SHAKE LIQUID AND USE 2 SPRAYS IN EACH NOSTRIL DAILY, Disp: 48 g, Rfl: 0 .  loratadine (CLARITIN) 10 MG tablet, Take 1 tablet (10 mg total) by mouth daily., Disp: 30 tablet, Rfl: 2 .  meloxicam (MOBIC) 15 MG tablet, TAKE 1 TABLET BY MOUTH  DAILY, Disp: 90 tablet, Rfl: 0 .  Multiple Vitamin (MULTIVITAMIN WITH MINERALS) TABS tablet, Take 1 tablet by mouth daily. One-A-Day, Disp: , Rfl:  .  omeprazole (PRILOSEC) 20 MG capsule, TAKE 1 CAPSULE(20 MG) BY MOUTH DAILY, Disp: 90 capsule, Rfl: 0  Allergies  Allergen Reactions  . Adhesive [Tape] Other (See Comments)    Blisters/rash  PLEASE USE PAPER TAPE ONLY  . Nickel     PT CAN WEAR GOLD, STERLING SILVER  WITH NO PROBLEM-PT HAS NEVER HAD ANY PROBLEMS IN THE OR    I personally reviewed active problem list, medication list, allergies, family history, social history, health maintenance with the patient/caregiver today.   ROS  Constitutional: Negative for fever or weight change.  Respiratory: Negative for cough and shortness of breath.   Cardiovascular: Negative for chest pain or palpitations.  Gastrointestinal: Negative for abdominal pain, no bowel changes.  Musculoskeletal: Negative for gait problem or joint swelling.  Skin: Negative for rash.  Neurological: Negative for dizziness or headache.  No other specific complaints in a complete review of systems (except as listed in HPI above).  Objective  Vitals:   03/17/21 1343  BP: 136/84  Pulse: (!) 114  Resp: 16  Temp: 98.2 F (36.8 C)  TempSrc: Oral  SpO2: 95%  Weight: 163 lb (73.9 kg)  Height: 5\' 1"  (1.549 m)    Body mass index is 30.8 kg/m.  Physical Exam  Constitutional: Patient appears well-developed and well-nourished. Obese  No distress.  HEENT: head atraumatic, normocephalic, pupils equal and reactive to light,  neck supple Cardiovascular: Normal rate, regular rhythm and normal heart sounds.  No murmur heard. No BLE edema. Pulmonary/Chest: Effort normal and breath sounds normal. No respiratory distress. Abdominal: Soft.  There is no tenderness. Muscular skeletal: gave reassurance that the bump she felt was xyphoid process  Psychiatric: Patient has a normal mood and affect. behavior is normal. Judgment and thought content normal.  PHQ2/9: Depression screen [facility] 2/9 03/17/2021 12/16/2020 07/14/2020 04/08/2020 02/27/2020  Decreased Interest 1 0 1 0 1  Down, Depressed, Hopeless 0 0 1 0 0  PHQ - 2 Score 1 0 2 0 1  Altered sleeping 3 - 1 0 2  Tired, decreased energy 1 - 1 0 1  Change in appetite 0 - 0 0 0  Feeling bad or failure about yourself  0 - 0 0 0  Trouble concentrating 0 - 0 0 3  Moving slowly or fidgety/restless 0  - 0 0 0  Suicidal thoughts 0 - 0 0 0  PHQ-9 Score 5 - 4 0 7  Difficult doing work/chores - - Not difficult at all Not difficult at all Not difficult at all  Some recent data might be hidden    phq 9 is positive   Fall Risk: Fall Risk  03/17/2021 12/16/2020 07/14/2020 04/08/2020 02/27/2020  Falls in the past year? 0 0 0 0 0  Number falls in past yr: 0 0 0 0 0  Injury with Fall? 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 Attention deficit hyperactivity disorder (ADHD), combined type  - amphetamine-dextroamphetamine (ADDERALL XR) 20 MG 24 hr capsule; Take 1 capsule (20 mg total) by mouth every morning.  Dispense: 30 capsule; Refill: 0 - amphetamine-dextroamphetamine (ADDERALL XR) 20 MG 24 hr capsule; Take 1 capsule (20 mg total) by mouth every morning.  Dispense: 30 capsule; Refill: 0 - amphetamine-dextroamphetamine (ADDERALL XR) 20 MG 24 hr capsule; Take 1 capsule (20 mg total) by mouth daily.  Dispense: 30 capsule; Refill: 0  3. Gastroesophageal reflux disease without esophagitis  - omeprazole (PRILOSEC) 20 MG capsule; Take 1 capsule (20 mg total) by mouth daily.  Dispense: 90 capsule; Refill: 0  4. Major depression, recurrent, chronic (HCC)  - escitalopram (LEXAPRO) 5 MG tablet; Take 1 tablet (5 mg total) by mouth daily.  Dispense: 90 tablet; Refill: 0  5. Obesity (BMI 30.0-34.9)  Discussed with the patient the  risk posed by an increased BMI. Discussed importance of portion control, calorie counting and at least  150 minutes of physical activity weekly. Avoid sweet beverages and drink more water. Eat at least 6 servings of fruit and vegetables daily   6. Vitamin D deficiency  Advised to resume vitamin D supplementation   7. GERD without esophagitis  Doing well on lower dose of PPI   8. Psychophysiological insomnia  - temazepam (RESTORIL) 15 MG capsule; Take 1 capsule (15 mg total) by mouth at bedtime as needed for sleep.  Dispense: 30 capsule; Refill: 2  9. History of right breast cancer  Keep follow up with oncologist June 2022

## 2021-03-17 ENCOUNTER — Other Ambulatory Visit: Payer: Self-pay

## 2021-03-17 ENCOUNTER — Ambulatory Visit: Payer: Managed Care, Other (non HMO) | Admitting: Family Medicine

## 2021-03-17 ENCOUNTER — Encounter: Payer: Self-pay | Admitting: Family Medicine

## 2021-03-17 VITALS — BP 136/84 | HR 114 | Temp 98.2°F | Resp 16 | Ht 61.0 in | Wt 163.0 lb

## 2021-03-17 DIAGNOSIS — K219 Gastro-esophageal reflux disease without esophagitis: Secondary | ICD-10-CM

## 2021-03-17 DIAGNOSIS — J4489 Other specified chronic obstructive pulmonary disease: Secondary | ICD-10-CM

## 2021-03-17 DIAGNOSIS — F339 Major depressive disorder, recurrent, unspecified: Secondary | ICD-10-CM

## 2021-03-17 DIAGNOSIS — J449 Chronic obstructive pulmonary disease, unspecified: Secondary | ICD-10-CM

## 2021-03-17 DIAGNOSIS — E559 Vitamin D deficiency, unspecified: Secondary | ICD-10-CM

## 2021-03-17 DIAGNOSIS — F902 Attention-deficit hyperactivity disorder, combined type: Secondary | ICD-10-CM | POA: Diagnosis not present

## 2021-03-17 DIAGNOSIS — F5104 Psychophysiologic insomnia: Secondary | ICD-10-CM

## 2021-03-17 DIAGNOSIS — E669 Obesity, unspecified: Secondary | ICD-10-CM

## 2021-03-17 DIAGNOSIS — E66811 Obesity, class 1: Secondary | ICD-10-CM

## 2021-03-17 DIAGNOSIS — Z853 Personal history of malignant neoplasm of breast: Secondary | ICD-10-CM

## 2021-03-17 MED ORDER — ESCITALOPRAM OXALATE 5 MG PO TABS: 5.0000 mg | ORAL_TABLET | Freq: Every day | ORAL | 0 refills | Status: DC

## 2021-03-17 MED ORDER — OMEPRAZOLE 20 MG PO CPDR: 20.0000 mg | DELAYED_RELEASE_CAPSULE | Freq: Every day | ORAL | 0 refills | Status: DC

## 2021-03-17 MED ORDER — AMPHETAMINE-DEXTROAMPHET ER 20 MG PO CP24: 20.0000 mg | ORAL_CAPSULE | ORAL | 0 refills | Status: DC

## 2021-03-17 MED ORDER — TEMAZEPAM 15 MG PO CAPS: 15.0000 mg | ORAL_CAPSULE | Freq: Every evening | ORAL | 2 refills | Status: DC | PRN

## 2021-03-17 MED ORDER — AMPHETAMINE-DEXTROAMPHET ER 20 MG PO CP24: 20.0000 mg | ORAL_CAPSULE | Freq: Every day | ORAL | 0 refills | Status: DC

## 2021-04-22 ENCOUNTER — Other Ambulatory Visit: Payer: Self-pay

## 2021-04-22 ENCOUNTER — Encounter: Payer: Self-pay | Admitting: Internal Medicine

## 2021-04-22 ENCOUNTER — Inpatient Hospital Stay: Payer: Managed Care, Other (non HMO) | Attending: Internal Medicine

## 2021-04-22 ENCOUNTER — Inpatient Hospital Stay (HOSPITAL_BASED_OUTPATIENT_CLINIC_OR_DEPARTMENT_OTHER): Payer: Managed Care, Other (non HMO) | Admitting: Internal Medicine

## 2021-04-22 DIAGNOSIS — Z170 Estrogen receptor positive status [ER+]: Secondary | ICD-10-CM

## 2021-04-22 DIAGNOSIS — Z006 Encounter for examination for normal comparison and control in clinical research program: Secondary | ICD-10-CM

## 2021-04-22 DIAGNOSIS — Z87891 Personal history of nicotine dependence: Secondary | ICD-10-CM | POA: Insufficient documentation

## 2021-04-22 DIAGNOSIS — C50511 Malignant neoplasm of lower-outer quadrant of right female breast: Secondary | ICD-10-CM | POA: Insufficient documentation

## 2021-04-22 DIAGNOSIS — M8580 Other specified disorders of bone density and structure, unspecified site: Secondary | ICD-10-CM | POA: Diagnosis not present

## 2021-04-22 DIAGNOSIS — R5383 Other fatigue: Secondary | ICD-10-CM | POA: Insufficient documentation

## 2021-04-22 LAB — CBC WITH DIFFERENTIAL/PLATELET
Abs Immature Granulocytes: 0.01 10*3/uL (ref 0.00–0.07)
Basophils Absolute: 0 10*3/uL (ref 0.0–0.1)
Basophils Relative: 1 %
Eosinophils Absolute: 0.2 10*3/uL (ref 0.0–0.5)
Eosinophils Relative: 3 %
HCT: 39.1 % (ref 36.0–46.0)
Hemoglobin: 12.8 g/dL (ref 12.0–15.0)
Immature Granulocytes: 0 %
Lymphocytes Relative: 32 %
Lymphs Abs: 2.2 10*3/uL (ref 0.7–4.0)
MCH: 27.2 pg (ref 26.0–34.0)
MCHC: 32.7 g/dL (ref 30.0–36.0)
MCV: 83.2 fL (ref 80.0–100.0)
Monocytes Absolute: 0.4 10*3/uL (ref 0.1–1.0)
Monocytes Relative: 6 %
Neutro Abs: 4.1 10*3/uL (ref 1.7–7.7)
Neutrophils Relative %: 58 %
Platelets: 296 10*3/uL (ref 150–400)
RBC: 4.7 MIL/uL (ref 3.87–5.11)
RDW: 14.5 % (ref 11.5–15.5)
WBC: 7 10*3/uL (ref 4.0–10.5)
nRBC: 0 % (ref 0.0–0.2)

## 2021-04-22 LAB — COMPREHENSIVE METABOLIC PANEL
ALT: 23 U/L (ref 0–44)
AST: 22 U/L (ref 15–41)
Albumin: 4.2 g/dL (ref 3.5–5.0)
Alkaline Phosphatase: 90 U/L (ref 38–126)
Anion gap: 11 (ref 5–15)
BUN: 9 mg/dL (ref 6–20)
CO2: 26 mmol/L (ref 22–32)
Calcium: 9.2 mg/dL (ref 8.9–10.3)
Chloride: 102 mmol/L (ref 98–111)
Creatinine, Ser: 0.63 mg/dL (ref 0.44–1.00)
GFR, Estimated: >60 mL/min (ref >60)
Glucose, Bld: 110 mg/dL — ABNORMAL HIGH (ref 70–99)
Potassium: 4.2 mmol/L (ref 3.5–5.1)
Sodium: 139 mmol/L (ref 135–145)
Total Bilirubin: 0.5 mg/dL (ref 0.3–1.2)
Total Protein: 6.6 g/dL (ref 6.5–8.1)

## 2021-04-22 NOTE — Progress Notes (Signed)
[place] OFFICE PROGRESS NOTE  Patient Care Team: Steele Sizer, MD as PCP - General (Family Medicine) Christene Lye, MD (General Surgery) Steele Sizer, MD as Attending Physician (Family Medicine) Rico Junker, RN as Oncology Nurse Navigator Cammie Sickle, MD as Consulting Physician (Internal Medicine)  Cancer Staging No matching staging information was found for the patient.   Oncology History Overview Note  # DEC 2017- RIGHT BREAST Stage II [pT1cpN1sn; screening] ER/PR- Pos- 90%; her 2 NEU- NEG s/p Lumpect & SLNBx; Dr.Sankar ]; JAN 2018- ddAC -T x12. AUG 2nd [finished RT].  # AUG 21st 2018- Start TAM; Nov 2018- Stopped sec intol/mood swings; arthralgias];  DEC 2019- Arimidex 1 mg/day; STOPPED JAN 2020 sec to intolerarnce.   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  female pleasant patient above history of stage II breast cancer ER PR positive currently NOT on adjuvant endocrine therapy [sec to intolerance] is in for follow-up/ ABC-be well clinical studies.   Continues to have chronic fatigue.  Not any worse.  Denies any worsening joint pains.  No new shortness of breath or cough.  No fever and chills.  Review of Systems  Constitutional: Positive for malaise/fatigue. Negative for chills, diaphoresis, fever and weight loss.  HENT: Negative for nosebleeds and sore throat.   Eyes: Negative for double vision.  Respiratory: Negative for hemoptysis and sputum production.   Cardiovascular: Negative for chest pain, palpitations,  orthopnea and leg swelling.  Gastrointestinal: Negative for abdominal pain, blood in stool, constipation, diarrhea, heartburn, melena, nausea and vomiting.  Genitourinary: Negative for dysuria, frequency and urgency.  Musculoskeletal: Negative for back pain.  Skin: Negative.  Negative for itching and rash.  Neurological: Negative for dizziness, tingling, focal weakness, weakness and headaches.  Endo/Heme/Allergies: Does not bruise/bleed easily.      PAST MEDICAL HISTORY :  Past Medical History:  Diagnosis Date  . Anemia   . Anxiety   . Breast cancer ([place]) 09/2016   right breast  . Bursitis of right shoulder   . Cancer [facility])    breast  . 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 Cataract, bilateral   . Chemotherapy induced nausea and vomiting   . Cold    states she has had it for 2 months  . Depression   . GERD (gastroesophageal reflux disease)    uses baking soda for symptoms  . Insomnia   . Lumbago   . Personal history of chemotherapy 11/22/2016  . Personal history of radiation therapy 01/20/2017  . Vitamin D deficiency     PAST SURGICAL HISTORY :   Past Surgical History:  Procedure Laterality Date  . ABDOMINAL HYSTERECTOMY  04/20/2014  . BILATERAL SALPINGECTOMY    . BREAST BIOPSY Right 09/27/2016   positive  . BREAST LUMPECTOMY Right 10/12/2016   positive  . BREAST LUMPECTOMY WITH SENTINEL LYMPH NODE BIOPSY Right 10/12/2016   Procedure: BREAST LUMPECTOMY WITH SENTINEL LYMPH NODE BX;  Surgeon: Christene Lye, MD;  Location: [facility];  Service: General;  Laterality: Right;  .  COLONOSCOPY WITH PROPOFOL N/A 05/20/2016   Procedure: COLONOSCOPY WITH PROPOFOL;  Surgeon: Darren Wohl, MD;  Location: [facility];  Service: Endoscopy;  Laterality: N/A;  . ECTOPIC PREGNANCY SURGERY    . ENDOMETRIAL ABLATION    . PORTACATH PLACEMENT Left 11/04/2016   Procedure: INSERTION PORT-A-CATH;  Surgeon: Seeplaputhur G  Sankar, MD;  Location: [facility];  Service: General;  Laterality: Left;  Left subclavian vein  . TUBAL LIGATION      FAMILY HISTORY :   Family History  Problem Relation Age of Onset  . Heart disease Mother   . Depression Father   . Obesity Father   . Diabetes Father   . Alcohol abuse Father   . COPD Father   . Alcohol abuse Brother   . Seizures Brother   . Breast cancer Sister 40    SOCIAL HISTORY:   Social History   Tobacco Use  . Smoking status: Former Smoker    Packs/day: 0.50    Years: 20.00    Pack years: 10.00    Types: Cigarettes    Quit date: 08/23/2016    Years since quitting: 4.6  . Smokeless tobacco: Never Used  Vaping Use  . Vaping Use: Never used  Substance Use Topics  . Alcohol use: Yes    Alcohol/week: 0.0 standard drinks    Comment: occassional  . Drug use: No    ALLERGIES:  is allergic to adhesive [tape] and nickel.  MEDICATIONS:  Current Outpatient Medications  Medication Sig Dispense Refill  . acetaminophen (TYLENOL) 325 MG tablet Take 325 mg by mouth every 6 (six) hours as needed for fever.    . albuterol (VENTOLIN HFA) 108 (90 Base) MCG/ACT inhaler INHALE 2 PUFFS INTO THE LUNGS EVERY 6 HOURS AS NEEDED FOR WHEEZING OR SHORTNESS OF BREATH 8.5 g 2  . amphetamine-dextroamphetamine (ADDERALL XR) 20 MG 24 hr capsule Take 1 capsule (20 mg total) by mouth every morning. 30 capsule 0  . fluticasone (FLONASE) 50 MCG/ACT nasal spray SHAKE LIQUID AND USE 2 SPRAYS IN EACH NOSTRIL DAILY 48 g 0  . loratadine (CLARITIN) 10 MG tablet Take 1 tablet (10 mg total) by mouth daily. 30 tablet 2  . meloxicam (MOBIC) 15 MG tablet TAKE 1 TABLET BY MOUTH  DAILY 90 tablet 0  . Multiple Vitamin (MULTIVITAMIN WITH MINERALS) TABS tablet Take 1 tablet by mouth daily. One-A-Day    . omeprazole (PRILOSEC) 20 MG capsule Take 1 capsule (20 mg total) by mouth daily. 90 capsule 0  . temazepam (RESTORIL) 15 MG capsule Take 1 capsule (15 mg total) by mouth at bedtime as needed for  sleep. 30 capsule 2  . amphetamine-dextroamphetamine (ADDERALL XR) 20 MG 24 hr capsule Take 1 capsule (20 mg total) by mouth daily. 30 capsule 0  . escitalopram (LEXAPRO) 5 MG tablet Take 1 tablet (5 mg total) by mouth daily. (Patient not taking: Reported on 04/22/2021) 90 tablet 0   No current facility-administered medications for this visit.    PHYSICAL EXAMINATION: ECOG PERFORMANCE STATUS: 0 - Asymptomatic  BP 134/78 (BP Location: Left Arm, Patient Position: Sitting)   Pulse 90   Temp 98 F (36.7 C) (Tympanic)   Resp 16   Wt 74.4 kg   SpO2 97%   BMI 30.99 kg/m   Filed Weights   04/22/21 1439  Weight: 74.4 kg    Physical Exam HENT:     Head: Normocephalic and atraumatic.     Mouth/Throat:     Pharynx: No oropharyngeal exudate.    Eyes:     Pupils: Pupils are equal, round, and reactive to light.  Cardiovascular:     Rate and Rhythm: Normal rate and regular rhythm.  Pulmonary:     Effort: No respiratory distress.     Breath sounds: No wheezing.  Abdominal:     General: Bowel sounds are normal. There is no distension.     Palpations: Abdomen is soft. There is no mass.     Tenderness: There is no abdominal tenderness. There is no guarding or rebound.  Musculoskeletal:        General: No tenderness. Normal range of motion.     Cervical back: Normal range of motion and neck supple.  Skin:    General: Skin is warm.     Comments: Right and left BREAST exam (in the presence of nurse)- no unusual skin changes or dominant masses felt. Surgical scars noted.    Neurological:     Mental Status: She is alert and oriented to person, place, and time.  Psychiatric:        Mood and Affect: Affect normal.     LABORATORY DATA:  I have reviewed the data as listed    Component Value Date/Time   NA 139 04/22/2021 1410   NA 141 02/22/2020 1331   K 4.2 04/22/2021 1410   CL 102 04/22/2021 1410   CO2 26 04/22/2021 1410   GLUCOSE 110 (H) 04/22/2021 1410   BUN 9 04/22/2021 1410    BUN 12 02/22/2020 1331   CREATININE 0.63 04/22/2021 1410   CALCIUM 9.2 04/22/2021 1410   PROT 6.6 04/22/2021 1410   PROT 6.2 02/22/2020 1331   ALBUMIN 4.2 04/22/2021 1410   ALBUMIN 4.3 02/22/2020 1331   AST 22 04/22/2021 1410   ALT 23 04/22/2021 1410   ALKPHOS 90 04/22/2021 1410   BILITOT 0.5 04/22/2021 1410   BILITOT 0.3 02/22/2020 1331   GFRNONAA >60 04/22/2021 1410   GFRAA 108 02/22/2020 1331    No results found for: SPEP, UPEP  Lab Results  Component Value Date   WBC 7.0 04/22/2021   NEUTROABS 4.1 04/22/2021   HGB 12.8 04/22/2021   HCT 39.1 04/22/2021   MCV 83.2 04/22/2021   PLT 296 04/22/2021      Chemistry      Component Value Date/Time   NA 139 04/22/2021 1410   NA 141 02/22/2020 1331   K 4.2 04/22/2021 1410   CL 102 04/22/2021 1410   CO2 26 04/22/2021 1410   BUN 9 04/22/2021 1410   BUN 12 02/22/2020 1331   CREATININE 0.63 04/22/2021 1410      Component Value Date/Time   CALCIUM 9.2 04/22/2021 1410   ALKPHOS 90 04/22/2021 1410   AST 22 04/22/2021 1410   ALT 23 04/22/2021 1410   BILITOT 0.5 04/22/2021 1410   BILITOT 0.3 02/22/2020 1331       RADIOGRAPHIC STUDIES: I have personally reviewed the radiological images as listed and agreed with the findings in the report. No results found.   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 STABLE. DEC Mammogram 2021-WNL.   # Continues to be in the ABC trial/also on trial involving lifestyle changes. Discussed with clinical trials nurse  #Osteopenia T score -2.1; continue calcium plus vitamin; plan bone density at next visit.  # Fatigue-.  Unclear etiology.  # DISPOSITION: #mammogram dec 2021/ BMD  #  follow up in 7 months-MD; cbc/cmp; ca 27-29-- Dr.B   Orders Placed This Encounter  Procedures  . DG Bone Density    Standing  Status:   Future    Standing Expiration Date:   04/22/2022    Order Specific Question:   Reason for Exam (SYMPTOM  OR DIAGNOSIS REQUIRED)    Answer:   History of osteopenia    Order Specific Question:   Is the patient pregnant?    Answer:   No    Order Specific Question:   Preferred imaging location?    Answer:   [facility]  . CBC    Standing Status:   Future    Standing Expiration Date:   04/22/2022  . Comprehensive metabolic panel    Standing Status:   Future    Standing Expiration Date:   04/22/2022  . CA 27.29 (SERIAL MONITOR)    Standing Status:   Future    Standing Expiration Date:   04/22/2022   All questions were answered. The patient knows to call the clinic with any problems, questions or concerns.       R , MD 04/23/2021 4:13 PM  

## 2021-04-22 NOTE — Research (Signed)
42 month / End of Treatment visit for ABC [id number] Shelby Barry presented to the clinic this morning for her 42 month visit for the [id number] ABC study. Patient did not return her medication diaries or pill bottles. She states that she thinks she threw the pill bottles away, but she will try and find the medication diaries.  Stressed to her that we need those as well and she states she will drop them off as soon as she finds them.  Patient states that she stopped her study drug the day she was notified by the research department of the study closure on October 22, 2020.  Solicited AE's reviewed with patient and she reports some mild, scattered bruising, which she states this continues to be normal for her.  She denies any other bleeding events. She was assessed in the clinic by Dr. Rogue Bussing today with no new complaints. The patient was informed that she was receiving Aspirin and not a placebo on the clinical trial.  Her next mammogram will be scheduled for December this year. Shelby Barry was instructed to call if she has any questions/concerns. The follow up scheduled for the ABC protocol was reviewed with the patient and will be annually or until the protocol states otherwise. The patient is in agreement with the protocol plan.   AEs and Solicited AEs are as follows:   Study/Protocol: [id number] ABC Cycle: 42 months Event Grade Onset Date Resolved Date Drug Name Attribution Treatment Comments  Bruising *Solicited AE 1 06/23/4234  Aspirin/placebo possible none Reports this is her norm  Insomnia 0 Medical history 04/22/2021  unrelated Trazodone but not taking   Vomiting 0 unknown 04/22/2021  unrelated  After eating dairy or fatty foods  Indigestion 1 Medical History   Unrelated omeprazole Has prilosec but not taking.  Shelby Fruit, RN 04/22/21 2:40 PM

## 2021-04-22 NOTE — Assessment & Plan Note (Addendum)
#  Breast cancer-stage II ER/PR positive HER-2/neu negative; declines Enodcrine therapy sec to intolerance. STABLE. DEC Mammogram 2021-WNL.   # Continues to be in the ABC trial/also on trial involving lifestyle changes. Discussed with clinical trials nurse  #Osteopenia T score -2.1-STABLE; continue calcium plus vitamin; plan bone density at next visit.  # Fatigue-.  Unclear etiology.  Defer to PCP.  # DISPOSITION: #mammogram dec 2021/ BMD  # follow up in 7 months-MD; cbc/cmp; ca 27-29-- Dr.B

## 2021-04-22 NOTE — Progress Notes (Signed)
Pt in for follow up, denies any concerns today. 

## 2021-04-23 ENCOUNTER — Other Ambulatory Visit: Payer: Self-pay | Admitting: *Deleted

## 2021-04-23 DIAGNOSIS — C50511 Malignant neoplasm of lower-outer quadrant of right female breast: Secondary | ICD-10-CM

## 2021-04-23 DIAGNOSIS — Z170 Estrogen receptor positive status [ER+]: Secondary | ICD-10-CM

## 2021-05-18 ENCOUNTER — Other Ambulatory Visit: Payer: Self-pay | Admitting: Family Medicine

## 2021-05-18 DIAGNOSIS — F339 Major depressive disorder, recurrent, unspecified: Secondary | ICD-10-CM

## 2021-06-15 ENCOUNTER — Other Ambulatory Visit: Payer: Self-pay | Admitting: Family Medicine

## 2021-06-15 DIAGNOSIS — F902 Attention-deficit hyperactivity disorder, combined type: Secondary | ICD-10-CM

## 2021-06-15 NOTE — Progress Notes (Signed)
Name: Shelby Barry   MRN: [id number]    DOB: [birth year]   Date:06/16/2021       Progress Note  Subjective  Chief Complaint  Follow Up  HPI  ADD combined type: she told me she always felt that she had ADD so we referred her for formal testing , she saw [facility] in [place] and the diagnosis was positive for ADD She feels overwhelmed all the time, she is always pacing, busy at home, worries about not being able to stay focused on tasks, starts multiple projects without finishing.   She is on Adderal XR 20 mg, she only takes when she wakes up very early. She denies side effects of medications  Major depression chronic recurrent: she has a long history of depression over the years, her father committed suicide when she was in her 23's. She is rending her house, she stays in [place] with her son and the days she is working she stays with her sister in [place].  She also feels pressured at work. She placed her mother in the nursing home, her brother in jail, but still worries at night. She states lexapro 10 mg caused her to be too sleepy, we tried half dose but still felt groggy the following day, Temazepam helps with sleep but still makes her feel groggy , she would like to try taking Ambien again. Explained it can cause tachyphylaxis and should only take it prn    History of breast cancer: still seeing hematologist, stopped anastrozole months ago, tried Tamoxifen for about 6 months but also stopped in 2019 on her own . She is up to date with mammogram, last mammogram was 11/21/2020 ,she saw oncologist in June and will have repeat mammogram in [place]    GERD: she has been taking Omeprazole daily, she states she has heartburn and epigastric pain if she skips one dose of medication. She is on the lower dose of Omeprazole and symptoms are controlled. Unchanged    Chronic bronchitis/asthma: she still has a cough a couple of times a week, mostly dry now , but usually when eating - chokes at  times,  she quit smoking in 2017.  She states no recent episodes of sob or wheezing. 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, estrogen receptor positive ([place]) 10/26/2016   Recurrent major depression-severe ([place]) 08/22/2015   Bursitis of shoulder 08/22/2015   Bilateral cataracts 08/22/2015   Circadian rhythm sleep disorder, 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   Vitamin D deficiency 08/22/2015   LBP (low back pain) 08/22/2015   GAD (generalized anxiety disorder) 08/22/2015   History of anemia 08/17/2007   Insomnia 08/16/2007    Past Surgical History:  Procedure Laterality Date   ABDOMINAL HYSTERECTOMY  04/20/2014   BILATERAL SALPINGECTOMY     BREAST BIOPSY Right 09/27/2016   positive   BREAST LUMPECTOMY Right 10/12/2016   positive   BREAST LUMPECTOMY WITH SENTINEL LYMPH NODE BIOPSY Right 10/12/2016   Procedure: BREAST LUMPECTOMY WITH SENTINEL LYMPH NODE BX;  Surgeon: Christene Lye, MD;  Location: [facility];  Service: General;  Laterality: Right;   COLONOSCOPY WITH PROPOFOL N/A 05/20/2016   Procedure: COLONOSCOPY WITH PROPOFOL;  Surgeon: Lucilla Lame, MD;  Location: [facility];  Service: Endoscopy;  Laterality:  N/A;   ECTOPIC PREGNANCY SURGERY     ENDOMETRIAL ABLATION     PORTACATH PLACEMENT Left 11/04/2016   Procedure: INSERTION PORT-A-CATH;  Surgeon: Christene Lye, MD;  Location: [facility];  Service: General;  Laterality: Left;  Left subclavian vein   TUBAL LIGATION      Family History  Problem Relation Age of Onset   Heart disease Mother    Depression Father    Obesity Father    Diabetes Father    Alcohol abuse Father    COPD Father    Alcohol abuse Brother    Seizures Brother    Breast cancer Sister 72    Social History   Tobacco  Use   Smoking status: Former    Packs/day: 0.50    Years: 20.00    Pack years: 10.00    Types: Cigarettes    Quit date: 08/23/2016    Years since quitting: 4.8   Smokeless tobacco: Never  Substance Use Topics   Alcohol use: Yes    Alcohol/week: 0.0 standard drinks    Comment: occassional     Current Outpatient Medications:    acetaminophen (TYLENOL) 325 MG tablet, Take 325 mg by mouth every 6 (six) hours as needed for fever., Disp: , Rfl:    albuterol (VENTOLIN HFA) 108 (90 Base) MCG/ACT inhaler, INHALE 2 PUFFS INTO THE LUNGS EVERY 6 HOURS AS NEEDED FOR WHEEZING OR SHORTNESS OF BREATH, Disp: 8.5 g, Rfl: 2   amphetamine-dextroamphetamine (ADDERALL XR) 20 MG 24 hr capsule, Take 1 capsule (20 mg total) by mouth every morning., Disp: 30 capsule, Rfl: 0   amphetamine-dextroamphetamine (ADDERALL XR) 20 MG 24 hr capsule, Take 1 capsule (20 mg total) by mouth daily., Disp: 30 capsule, Rfl: 0   fluticasone (FLONASE) 50 MCG/ACT nasal spray, SHAKE LIQUID AND USE 2 SPRAYS IN EACH NOSTRIL DAILY, Disp: 48 g, Rfl: 0   loratadine (CLARITIN) 10 MG tablet, Take 1 tablet (10 mg total) by mouth daily., Disp: 30 tablet, Rfl: 2   meloxicam (MOBIC) 15 MG tablet, TAKE 1 TABLET BY MOUTH  DAILY, Disp: 90 tablet, Rfl: 0   Multiple Vitamin (MULTIVITAMIN WITH MINERALS) TABS tablet, Take 1 tablet by mouth daily. One-A-Day, Disp: , Rfl:    omeprazole (PRILOSEC) 20 MG capsule, Take 1 capsule (20 mg total) by mouth daily., Disp: 90 capsule, Rfl: 0  Allergies  Allergen Reactions   Adhesive [Tape] Other (See Comments)    Blisters/rash  PLEASE USE PAPER TAPE ONLY   Nickel     PT CAN WEAR GOLD, STERLING SILVER WITH NO PROBLEM-PT HAS NEVER HAD ANY PROBLEMS IN THE OR    I personally reviewed active problem list, medication list, allergies, family history, social history, health maintenance with the patient/caregiver today.   ROS  Constitutional: Negative for fever , positive for  weight change.  Respiratory:  Negative for cough and shortness of breath.   Cardiovascular: Negative for chest pain or palpitations.  Gastrointestinal: Negative for abdominal pain, no bowel changes.  Musculoskeletal: Negative for gait problem or joint swelling.  Skin: Negative for rash.  Neurological: Negative for dizziness or headache.  No other specific complaints in a complete review of systems (except as listed in HPI above).   Objective  Vitals:   06/16/21 1331  BP: 122/70  Pulse: 96  Resp: 16  Temp: 98.1 F (36.7 C)  SpO2: 97%  Weight: 169 lb (76.7 kg)  Height: '5\' 1"'$  (1.549 m)    Body mass index is 31.93 kg/m.  Physical Exam  Constitutional: Patient appears well-developed and well-nourished. Obese  No distress.  HEENT: head atraumatic, normocephalic, pupils equal and reactive to light, neck supple Cardiovascular: Normal rate, regular rhythm and normal heart sounds.  No murmur heard. No BLE edema. Pulmonary/Chest: Effort normal and breath sounds normal. No respiratory distress. Abdominal: Soft.  There is no tenderness. Psychiatric: Patient has a normal mood and affect. behavior is normal. Judgment and thought content normal.   Recent Results (from the past 2160 hour(s))  Comprehensive metabolic panel     Status: Abnormal   Collection Time: 04/22/21  2:10 PM  Result Value Ref Range   Sodium 139 135 - 145 mmol/L   Potassium 4.2 3.5 - 5.1 mmol/L   Chloride 102 98 - 111 mmol/L   CO2 26 22 - 32 mmol/L   Glucose, Bld 110 (H) 70 - 99 mg/dL    Comment: Glucose reference range applies only to samples taken after fasting for at least 8 hours.   BUN 9 6 - 20 mg/dL   Creatinine, Ser 0.63 0.44 - 1.00 mg/dL   Calcium 9.2 8.9 - 10.3 mg/dL   Total Protein 6.6 6.5 - 8.1 g/dL   Albumin 4.2 3.5 - 5.0 g/dL   AST 22 15 - 41 U/L   ALT 23 0 - 44 U/L   Alkaline Phosphatase 90 38 - 126 U/L   Total Bilirubin 0.5 0.3 - 1.2 mg/dL   GFR, Estimated >60 >60 mL/min    Comment: (NOTE) Calculated using the CKD-EPI  Creatinine Equation (2021)    Anion gap 11 5 - 15    Comment: Performed at [facility], [address] [place], [place] [address]  CBC with Differential/Platelet     Status: None   Collection Time: 04/22/21  2:10 PM  Result Value Ref Range   WBC 7.0 4.0 - 10.5 K/uL   RBC 4.70 3.87 - 5.11 MIL/uL   Hemoglobin 12.8 12.0 - 15.0 g/dL   HCT 39.1 36.0 - 46.0 %   MCV 83.2 80.0 - 100.0 fL   MCH 27.2 26.0 - 34.0 pg   MCHC 32.7 30.0 - 36.0 g/dL   RDW 14.5 11.5 - 15.5 %   Platelets 296 150 - 400 K/uL   nRBC 0.0 0.0 - 0.2 %   Neutrophils Relative % 58 %   Neutro Abs 4.1 1.7 - 7.7 K/uL   Lymphocytes Relative 32 %   Lymphs Abs 2.2 0.7 - 4.0 K/uL   Monocytes Relative 6 %   Monocytes Absolute 0.4 0.1 - 1.0 K/uL   Eosinophils Relative 3 %   Eosinophils Absolute 0.2 0.0 - 0.5 K/uL   Basophils Relative 1 %   Basophils Absolute 0.0 0.0 - 0.1 K/uL   Immature Granulocytes 0 %   Abs Immature Granulocytes 0.01 0.00 - 0.07 K/uL    Comment: Performed at [facility] [address] [place], [place] [address]    PHQ2/9: Depression screen [facility] 2/9 06/16/2021 03/17/2021 12/16/2020 07/14/2020 04/08/2020  Decreased Interest 0 1 0 1 0  Down, Depressed, Hopeless 0 0 0 1 0  PHQ - 2 Score 0 1 0 2 0  Altered sleeping 3 3 - 1 0  Tired, decreased energy 3 1 - 1 0  Change in appetite 0 0 - 0 0  Feeling bad or failure about yourself  0 0 - 0 0  Trouble concentrating 0 0 - 0 0  Moving slowly or fidgety/restless 0 0 - 0 0  Suicidal thoughts  0 0 - 0 0  PHQ-9 Score 6 5 - 4 0  Difficult doing work/chores - - - Not difficult at all Not difficult at all  Some recent data might be hidden    phq 9 is positive   Fall Risk: Fall Risk  06/16/2021 03/17/2021 12/16/2020 07/14/2020 04/08/2020  Falls in the past year? 0 0 0 0 0  Number falls in past yr: 0 0 0 0 0  Injury with Fall? 0 0 0 0 0  Follow up - - - - Falls evaluation completed     Functional Status Survey: Is the patient deaf or have difficulty  hearing?: No Does the patient have difficulty seeing, even when wearing glasses/contacts?: No Does the patient have difficulty concentrating, remembering, or making decisions?: Yes Does the patient have difficulty walking or climbing stairs?: No Does the patient have difficulty dressing or bathing?: No Does the patient have difficulty doing errands alone such as visiting a doctor's office or shopping?: No    Assessment & Plan  1. Attention deficit hyperactivity disorder (ADHD), combined type  Continue medication , forgot to pick up last rx   2. Psychophysiological insomnia  We will try Ambien again  3. Gastroesophageal reflux disease without esophagitis  - omeprazole (PRILOSEC) 20 MG capsule; Take 1 capsule (20 mg total) by mouth daily.  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   5. GERD without esophagitis   6. Major depression, recurrent, chronic (HCC)  Discussed other SSRI medications, could not tolerate lexapro we will try low dose zoloft   25 mg at first   7. Obesity (BMI 30.0-34.9)  Discussed with the patient the risk posed by an increased BMI. Discussed importance of portion control, calorie counting and at least 150 minutes of physical activity weekly. Avoid sweet beverages and drink more water. Eat at least 6 servings of fruit and vegetables daily    8. Vitamin D deficiency

## 2021-06-15 NOTE — Telephone Encounter (Signed)
Pt is requesting a refill on her Adderall.  She has an appt tomorrow.  [facility] [address]  CB#

## 2021-06-15 NOTE — Telephone Encounter (Signed)
Requested medication (s) are due for refill today: yes  Requested medication (s) are on the active medication list: yes  Last refill:  03/17/2021  Future visit scheduled: yes  Notes to clinic:  this refill cannot be delegated    Requested Prescriptions  Pending Prescriptions Disp Refills   amphetamine-dextroamphetamine (ADDERALL XR) 20 MG 24 hr capsule 30 capsule 0    Sig: Take 1 capsule (20 mg total) by mouth every morning.      Not Delegated - Psychiatry:  Stimulants/ADHD Failed - 06/15/2021  9:33 AM      Failed - This refill cannot be delegated      Failed - Urine Drug Screen completed in last 360 days      Passed - Valid encounter within last 3 months    Recent Outpatient Visits           3 months ago COPD with asthma [facility])   [facility] Steele Sizer, MD   6 months ago COPD with asthma [facility])   [facility] Steele Sizer, MD   11 months ago GERD without esophagitis   [facility] Steele Sizer, MD   1 year ago Attention deficit hyperactivity disorder (ADHD), combined type   [facility] Steele Sizer, MD   1 year ago Attention deficit hyperactivity disorder (ADHD), combined type   [facility] Steele Sizer, MD       Future Appointments             Tomorrow Steele Sizer, MD [facility]

## 2021-06-16 ENCOUNTER — Ambulatory Visit: Payer: Managed Care, Other (non HMO) | Admitting: Family Medicine

## 2021-06-16 ENCOUNTER — Other Ambulatory Visit: Payer: Self-pay

## 2021-06-16 ENCOUNTER — Encounter: Payer: Self-pay | Admitting: Family Medicine

## 2021-06-16 VITALS — BP 122/70 | HR 96 | Temp 98.1°F | Resp 16 | Ht 61.0 in | Wt 169.0 lb

## 2021-06-16 DIAGNOSIS — F902 Attention-deficit hyperactivity disorder, combined type: Secondary | ICD-10-CM

## 2021-06-16 DIAGNOSIS — F339 Major depressive disorder, recurrent, unspecified: Secondary | ICD-10-CM

## 2021-06-16 DIAGNOSIS — F5104 Psychophysiologic insomnia: Secondary | ICD-10-CM

## 2021-06-16 DIAGNOSIS — K219 Gastro-esophageal reflux disease without esophagitis: Secondary | ICD-10-CM | POA: Diagnosis not present

## 2021-06-16 DIAGNOSIS — J449 Chronic obstructive pulmonary disease, unspecified: Secondary | ICD-10-CM

## 2021-06-16 DIAGNOSIS — E66811 Obesity, class 1: Secondary | ICD-10-CM

## 2021-06-16 DIAGNOSIS — E559 Vitamin D deficiency, unspecified: Secondary | ICD-10-CM

## 2021-06-16 DIAGNOSIS — J4489 Other specified chronic obstructive pulmonary disease: Secondary | ICD-10-CM

## 2021-06-16 DIAGNOSIS — E669 Obesity, unspecified: Secondary | ICD-10-CM

## 2021-06-16 MED ORDER — ZOLPIDEM TARTRATE 5 MG PO TABS: 5.0000 mg | ORAL_TABLET | Freq: Every evening | ORAL | 2 refills | Status: DC | PRN

## 2021-06-16 MED ORDER — SERTRALINE HCL 25 MG PO TABS: 25.0000 mg | ORAL_TABLET | Freq: Every day | ORAL | 2 refills | Status: DC

## 2021-06-16 MED ORDER — OMEPRAZOLE 20 MG PO CPDR: 20.0000 mg | DELAYED_RELEASE_CAPSULE | Freq: Every day | ORAL | 1 refills | Status: DC

## 2021-07-31 ENCOUNTER — Inpatient Hospital Stay: Payer: Managed Care, Other (non HMO) | Attending: Internal Medicine | Admitting: Internal Medicine

## 2021-07-31 ENCOUNTER — Encounter: Payer: Self-pay | Admitting: *Deleted

## 2021-07-31 ENCOUNTER — Other Ambulatory Visit: Payer: Self-pay

## 2021-07-31 VITALS — BP 143/75 | HR 84 | Temp 98.5°F | Resp 20 | Wt 171.0 lb

## 2021-07-31 DIAGNOSIS — R5383 Other fatigue: Secondary | ICD-10-CM | POA: Insufficient documentation

## 2021-07-31 DIAGNOSIS — C50511 Malignant neoplasm of lower-outer quadrant of right female breast: Secondary | ICD-10-CM | POA: Insufficient documentation

## 2021-07-31 DIAGNOSIS — R1011 Right upper quadrant pain: Secondary | ICD-10-CM | POA: Diagnosis not present

## 2021-07-31 DIAGNOSIS — R112 Nausea with vomiting, unspecified: Secondary | ICD-10-CM | POA: Diagnosis not present

## 2021-07-31 DIAGNOSIS — Z170 Estrogen receptor positive status [ER+]: Secondary | ICD-10-CM

## 2021-07-31 DIAGNOSIS — Z006 Encounter for examination for normal comparison and control in clinical research program: Secondary | ICD-10-CM | POA: Diagnosis not present

## 2021-07-31 DIAGNOSIS — R5382 Chronic fatigue, unspecified: Secondary | ICD-10-CM | POA: Insufficient documentation

## 2021-07-31 DIAGNOSIS — M8580 Other specified disorders of bone density and structure, unspecified site: Secondary | ICD-10-CM | POA: Insufficient documentation

## 2021-07-31 DIAGNOSIS — Z87891 Personal history of nicotine dependence: Secondary | ICD-10-CM | POA: Diagnosis not present

## 2021-07-31 NOTE — Progress Notes (Signed)
[facility] OFFICE PROGRESS NOTE  Patient Care Team: Steele Sizer, MD as PCP - General (Family Medicine) Christene Lye, MD (General Surgery) Steele Sizer, MD as Attending Physician (Family Medicine) Rico Junker, RN as Oncology Nurse Navigator Cammie Sickle, MD as Consulting Physician (Internal Medicine)  Cancer Staging No matching staging information was found for the patient.   Oncology History Overview Note  # DEC 2017- RIGHT BREAST Stage II [pT1cpN1sn; screening] ER/PR- Pos- 90%; her 2 NEU- NEG s/p Lumpect & SLNBx; Dr.Sankar ]; JAN 2018- ddAC -T x12. AUG 2nd [finished RT].  # AUG 21st 2018- Start TAM; Nov 2018- Stopped sec intol/mood swings; arthralgias];  DEC 2019- Arimidex 1 mg/day; STOPPED JAN 2020 sec to intolerarnce.   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  female pleasant patient above history of stage II breast cancer ER PR positive currently NOT on adjuvant endocrine therapy [sec to intolerance] is in for follow-up/ ABC-be well clinical studies.   Complains of abdominal pain right upper quadrant 2-3 times a week x 1-2 months. No aggravating or releiving factors; 3-4/10; nausea 5 times a week.   Continues to have chronic fatigue.  Not any worse.  Denies any worsening joint pains.  No new shortness of breath or cough.  No fever and chills.  Review of Systems  Constitutional:  Positive for malaise/fatigue. Negative for chills, diaphoresis, fever and weight loss.  HENT:  Negative for nosebleeds and sore throat.   Eyes:   Negative for double vision.  Respiratory:  Negative for hemoptysis and sputum production.   Cardiovascular:  Negative for chest pain, palpitations, orthopnea and leg swelling.  Gastrointestinal:  Positive for abdominal pain, nausea and vomiting. Negative for blood in stool, constipation, diarrhea, heartburn and melena.  Genitourinary:  Negative for dysuria, frequency and urgency.  Musculoskeletal:  Negative for back pain.  Skin: Negative.  Negative for itching and rash.  Neurological:  Negative for dizziness, tingling, focal weakness, weakness and headaches.  Endo/Heme/Allergies:  Does not bruise/bleed easily.     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, estrogen receptor positive ([place])    Cataract, bilateral    Chemotherapy induced nausea and vomiting    Cold    states she has had it for 2 months   Depression    GERD (gastroesophageal reflux disease)    uses baking soda for symptoms   Insomnia    Lumbago    Personal history of chemotherapy 11/22/2016   Personal history of radiation therapy 01/20/2017   Vitamin D deficiency     PAST SURGICAL HISTORY :   Past Surgical History:  Procedure Laterality Date   ABDOMINAL HYSTERECTOMY  04/20/2014   BILATERAL SALPINGECTOMY     BREAST BIOPSY Right 09/27/2016   positive   BREAST LUMPECTOMY Right 10/12/2016  positive   BREAST LUMPECTOMY WITH SENTINEL LYMPH NODE BIOPSY Right 10/12/2016   Procedure: BREAST LUMPECTOMY WITH SENTINEL LYMPH NODE BX;  Surgeon: Christene Lye, MD;  Location: [facility];  Service: General;  Laterality: Right;   COLONOSCOPY WITH PROPOFOL N/A 05/20/2016   Procedure: COLONOSCOPY WITH PROPOFOL;  Surgeon: Lucilla Lame, MD;  Location: [facility];  Service: Endoscopy;  Laterality: N/A;   ECTOPIC  PREGNANCY SURGERY     ENDOMETRIAL ABLATION     PORTACATH PLACEMENT Left 11/04/2016   Procedure: INSERTION PORT-A-CATH;  Surgeon: Christene Lye, MD;  Location: [facility];  Service: General;  Laterality: Left;  Left subclavian vein   TUBAL LIGATION      FAMILY HISTORY :   Family History  Problem Relation Age of Onset   Heart disease Mother    Depression Father    Obesity Father    Diabetes Father    Alcohol abuse Father    COPD Father    Alcohol abuse Brother    Seizures Brother    Breast cancer Sister 4    SOCIAL HISTORY:   Social History   Tobacco Use   Smoking status: Former    Packs/day: 0.50    Years: 20.00    Pack years: 10.00    Types: Cigarettes    Quit date: 08/23/2016    Years since quitting: 4.9   Smokeless tobacco: Never  Vaping Use   Vaping Use: Never used  Substance Use Topics   Alcohol use: Yes    Alcohol/week: 0.0 standard drinks    Comment: occassional   Drug use: No    ALLERGIES:  is allergic to adhesive [tape] and nickel.  MEDICATIONS:  Current Outpatient Medications  Medication Sig Dispense Refill   albuterol (VENTOLIN HFA) 108 (90 Base) MCG/ACT inhaler INHALE 2 PUFFS INTO THE LUNGS EVERY 6 HOURS AS NEEDED FOR WHEEZING OR SHORTNESS OF BREATH 8.5 g 2   amphetamine-dextroamphetamine (ADDERALL XR) 20 MG 24 hr capsule Take 1 capsule (20 mg total) by mouth daily. 30 capsule 0   Multiple Vitamin (MULTIVITAMIN WITH MINERALS) TABS tablet Take 1 tablet by mouth daily. One-A-Day     omeprazole (PRILOSEC) 20 MG capsule Take 1 capsule (20 mg total) by mouth daily. 90 capsule 1   sertraline (ZOLOFT) 25 MG tablet Take 1 tablet (25 mg total) by mouth daily. 30 tablet 2   zolpidem (AMBIEN) 5 MG tablet Take 1 tablet (5 mg total) by mouth at bedtime as needed for sleep. 45 tablet 2   acetaminophen (TYLENOL) 325 MG tablet Take 325 mg by mouth every 6 (six) hours as needed for fever. (Patient not taking: Reported on 07/31/2021)     fluticasone (FLONASE) 50  MCG/ACT nasal spray SHAKE LIQUID AND USE 2 SPRAYS IN EACH NOSTRIL DAILY (Patient not taking: Reported on 07/31/2021) 48 g 0   loratadine (CLARITIN) 10 MG tablet Take 1 tablet (10 mg total) by mouth daily. (Patient not taking: Reported on 07/31/2021) 30 tablet 2   meloxicam (MOBIC) 15 MG tablet TAKE 1 TABLET BY MOUTH  DAILY (Patient not taking: Reported on 07/31/2021) 90 tablet 0   No current facility-administered medications for this visit.    PHYSICAL EXAMINATION: ECOG PERFORMANCE STATUS: 0 - Asymptomatic  BP (!) 143/75   Pulse 84   Temp 98.5 F (36.9 C) (Tympanic)   Resp 20   Wt 171 lb (77.6 kg)   BMI 32.31 kg/m   Filed Weights   07/31/21 1450  Weight: 171 lb (77.6 kg)  Physical Exam HENT:     Head: Normocephalic and atraumatic.     Mouth/Throat:     Pharynx: No oropharyngeal exudate.  Eyes:     Pupils: Pupils are equal, round, and reactive to light.  Cardiovascular:     Rate and Rhythm: Normal rate and regular rhythm.  Pulmonary:     Effort: No respiratory distress.     Breath sounds: No wheezing.  Abdominal:     General: Bowel sounds are normal. There is no distension.     Palpations: Abdomen is soft. There is no mass.     Tenderness: There is abdominal tenderness. There is no guarding or rebound.     Comments: Mild tenderness in the right upper quadrant.  Musculoskeletal:        General: No tenderness. Normal range of motion.     Cervical back: Normal range of motion and neck supple.  Skin:    General: Skin is warm.     Comments:    Neurological:     Mental Status: She is alert and oriented to person, place, and time.  Psychiatric:        Mood and Affect: Affect normal.    LABORATORY DATA:  I have reviewed the data as listed    Component Value Date/Time   NA 139 04/22/2021 1410   NA 141 02/22/2020 1331   K 4.2 04/22/2021 1410   CL 102 04/22/2021 1410   CO2 26 04/22/2021 1410   GLUCOSE 110 (H) 04/22/2021 1410   BUN 9 04/22/2021 1410   BUN 12 02/22/2020  1331   CREATININE 0.63 04/22/2021 1410   CALCIUM 9.2 04/22/2021 1410   PROT 6.6 04/22/2021 1410   PROT 6.2 02/22/2020 1331   ALBUMIN 4.2 04/22/2021 1410   ALBUMIN 4.3 02/22/2020 1331   AST 22 04/22/2021 1410   ALT 23 04/22/2021 1410   ALKPHOS 90 04/22/2021 1410   BILITOT 0.5 04/22/2021 1410   BILITOT 0.3 02/22/2020 1331   GFRNONAA >60 04/22/2021 1410   GFRAA 108 02/22/2020 1331    No results found for: SPEP, UPEP  Lab Results  Component Value Date   WBC 7.0 04/22/2021   NEUTROABS 4.1 04/22/2021   HGB 12.8 04/22/2021   HCT 39.1 04/22/2021   MCV 83.2 04/22/2021   PLT 296 04/22/2021      Chemistry      Component Value Date/Time   NA 139 04/22/2021 1410   NA 141 02/22/2020 1331   K 4.2 04/22/2021 1410   CL 102 04/22/2021 1410   CO2 26 04/22/2021 1410   BUN 9 04/22/2021 1410   BUN 12 02/22/2020 1331   CREATININE 0.63 04/22/2021 1410      Component Value Date/Time   CALCIUM 9.2 04/22/2021 1410   ALKPHOS 90 04/22/2021 1410   AST 22 04/22/2021 1410   ALT 23 04/22/2021 1410   BILITOT 0.5 04/22/2021 1410   BILITOT 0.3 02/22/2020 1331       RADIOGRAPHIC STUDIES: I have personally reviewed the radiological images as listed and agreed with the findings in the report. No results found.   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 STABLE. DEC Mammogram 2021-WNL.   # Continues to be in the ABC trial-finished /; Be well- estyle changes. Discussed with clinical trials nurse  # right upper abdominal pain/ Nausea-voimiting- ? Lactose intolerance versus other causes.  LFTs normal.  Recommend ultrasound of the abdomen.  Refer to GI.  #Osteopenia T score -2.1-STABLE; continue calcium plus vitamin; plan bone density at next visit.  # Fatigue- Unclear etiology.  Defer to PCP.  # DISPOSITION:  # US  abdomen ASAP  # refer to GI re: right upper abdominal pain/ Nausea-voimiting  # follow up in 6 months-MD; cbc/cmp; ca 27-29-- Dr.B   Orders Placed This Encounter  Procedures   US ABDOMEN LIMITED RUQ (LIVER/GB)    Standing Status:   Future    Standing Expiration Date:   07/31/2022    Order Specific Question:   Reason for Exam (SYMPTOM  OR DIAGNOSIS REQUIRED)    Answer:   right upper abdominal pain    Order Specific Question:   Preferred imaging location?    Answer:   [facility]   All questions were answered. The patient knows to call the clinic with any problems, questions or concerns.      Cammie Sickle, MD 07/31/2021 9:00 PM

## 2021-07-31 NOTE — Progress Notes (Signed)
Patient reports intermittent abdominal discomfort in right lower quadrant. Described as a sharpe pain. Pt states that she has at least 5 episodes of vomiting per week. Pt has noted some shortness of breath when ambulating stairs.

## 2021-07-31 NOTE — Research (Signed)
BWEL, Month 48 Visit  07/31/21 14:40PM  Patient Shelby Barry presents today unaccompanied for her 34 Month visit on the [place] study.  Consent Addendum - Patient was provided with the consent addendum today for her review ([facility] active date 07/09/2021 for protocol version 03/11/21).  This RN discussed with the patient the purpose of the consent addendum, voluntary and optional nature of the addendum, and explained the optional samples and questionnaire would not be completed at this visit, but rather at Year 5 visit.  The patient decided to take the addendum home with her to review and make a decision prior to her visit next year.    Questionnaires - Patient was provided with study questionnaires and completed these before seeing the provider today.  This RN reviewed for completeness.  Adverse Events - AEs and Solicited AEs are no longer collected at this point in the patient's trial participation.  Weight - This RN collected the patient's two times, in light clothing and without shoes.  Both measurements were 77.1kg.  Waist and Hip Circumference -  Waist and Hip circumference were measured twice by this RN, per protocol.  Circumferences listed below: Waist - 93.5cm, 94.0cm Hip - 110.0cm, 110.5cm  H&P - Patient is seen by Dr. Rogue Bussing today for, please see his documentation in the Office Visit Encounter from today.  Patient's other vital signs are likewise documented in that encounter.  Visit Planning - Patient is not due for another BWEL visit until next year.  This RN thanked the patient for her ongoing participation in the [place] study.  Patient denied any questions or concerns related to trial participation.  Doreatha Martin, RN, BSN, [facility] 07/31/2021 3:20 PM

## 2021-07-31 NOTE — Assessment & Plan Note (Addendum)
#  Breast cancer-stage II ER/PR positive HER-2/neu negative; declines Enodcrine therapy sec to intolerance. STABLE. DEC Mammogram 2021-WNL.   # Continues to be in the ABC trial-finished /; Be well- estyle changes. Discussed with clinical trials nurse  # right upper abdominal pain/ Nausea-voimiting- ? Lactose intolerance versus other causes.  LFTs normal.  Recommend ultrasound of the abdomen.  Refer to GI.  #Osteopenia T score -2.1-STABLE; continue calcium plus vitamin; plan bone density at next visit.  # Fatigue- Unclear etiology.  Defer to PCP.  # DISPOSITION:  # US abdomen ASAP  # refer to GI re: right upper abdominal pain/ Nausea-voimiting  # follow up in 6 months-MD; cbc/cmp; ca 27-29-- Dr.B

## 2021-08-05 ENCOUNTER — Ambulatory Visit
Admission: RE | Admit: 2021-08-05 | Discharge: 2021-08-05 | Disposition: A | Discharge status: Home / Self Care | Payer: Managed Care, Other (non HMO) | Source: Ambulatory Visit | Attending: Internal Medicine | Admitting: Internal Medicine

## 2021-08-05 ENCOUNTER — Other Ambulatory Visit: Payer: Self-pay

## 2021-08-05 DIAGNOSIS — C50511 Malignant neoplasm of lower-outer quadrant of right female breast: Secondary | ICD-10-CM | POA: Diagnosis present

## 2021-08-05 DIAGNOSIS — R1011 Right upper quadrant pain: Secondary | ICD-10-CM | POA: Insufficient documentation

## 2021-08-05 DIAGNOSIS — Z170 Estrogen receptor positive status [ER+]: Secondary | ICD-10-CM | POA: Insufficient documentation

## 2021-08-14 ENCOUNTER — Encounter: Payer: Self-pay | Admitting: Internal Medicine

## 2021-08-24 ENCOUNTER — Telehealth: Payer: Self-pay | Admitting: Family Medicine

## 2021-08-24 DIAGNOSIS — F902 Attention-deficit hyperactivity disorder, combined type: Secondary | ICD-10-CM

## 2021-08-24 NOTE — Telephone Encounter (Signed)
Medication Refill - Medication: Adderall  Has the patient contacted their pharmacy? Yes.   Pt states that contacted the [facility] and they did not have medication on file. Pt states that she would like to try new pharmacy and is requesting to have this sent in. Please advise.  (Agent: If no, request that the patient contact the pharmacy for the refill.) (Agent: If yes, when and what did the pharmacy advise?)  Preferred Pharmacy (with phone number or street name):  [facility] (N), [place] [address]  [place] [address]  Phone: [phone] Fax: [phone]  Hours: Not open 24 hours   Has the patient been seen for an appointment in the last year OR does the patient have an upcoming appointment? Yes.    Agent: Please be advised that RX refills may take up to 3 business days. We ask that you follow-up with your pharmacy.

## 2021-08-25 ENCOUNTER — Other Ambulatory Visit: Payer: Self-pay | Admitting: Family Medicine

## 2021-08-25 DIAGNOSIS — F902 Attention-deficit hyperactivity disorder, combined type: Secondary | ICD-10-CM

## 2021-08-25 MED ORDER — AMPHETAMINE-DEXTROAMPHET ER 20 MG PO CP24: 20.0000 mg | ORAL_CAPSULE | Freq: Every day | ORAL | 0 refills | Status: DC

## 2021-08-25 NOTE — Telephone Encounter (Signed)
Requested medication (s) are due for refill today -yes  Requested medication (s) are on the active medication list -yes  Future visit scheduled -no  Last refill: 03/17/21 #30  Notes to clinic: Request for RF: non delegated Rx  Requested Prescriptions  Pending Prescriptions Disp Refills   amphetamine-dextroamphetamine (ADDERALL XR) 20 MG 24 hr capsule 30 capsule 0    Sig: Take 1 capsule (20 mg total) by mouth daily.     Not Delegated - Psychiatry:  Stimulants/ADHD Failed - 08/24/2021  5:18 PM      Failed - This refill cannot be delegated      Failed - Urine Drug Screen completed in last 360 days      Passed - Valid encounter within last 3 months    Recent Outpatient Visits           2 months ago COPD with asthma [facility])   [facility] Steele Sizer, MD   5 months ago COPD with asthma [facility])   [facility] Steele Sizer, MD   8 months ago COPD with asthma [facility])   [facility] Steele Sizer, MD   1 year ago GERD without esophagitis   [facility], Drue Stager, MD   1 year ago Attention deficit hyperactivity disorder (ADHD), combined type   [facility] Steele Sizer, MD                 Requested Prescriptions  Pending Prescriptions Disp Refills   amphetamine-dextroamphetamine (ADDERALL XR) 20 MG 24 hr capsule 30 capsule 0    Sig: Take 1 capsule (20 mg total) by mouth daily.     Not Delegated - Psychiatry:  Stimulants/ADHD Failed - 08/24/2021  5:18 PM      Failed - This refill cannot be delegated      Failed - Urine Drug Screen completed in last 360 days      Passed - Valid encounter within last 3 months    Recent Outpatient Visits           2 months ago COPD with asthma [facility])   [facility] Steele Sizer, MD   5 months ago COPD with asthma [facility])   [facility] Steele Sizer, MD   8 months ago COPD with asthma [facility])    [facility] Steele Sizer, MD   1 year ago GERD without esophagitis   [facility] Steele Sizer, MD   1 year ago Attention deficit hyperactivity disorder (ADHD), combined type   [facility] Steele Sizer, MD

## 2021-08-25 NOTE — Telephone Encounter (Signed)
Mb full please schedule appt

## 2021-08-25 NOTE — Telephone Encounter (Signed)
Patient adamant about not making another appointment because she is well and was just in August(which I dont see, tells me July)and  she is just trying to get her medication. I did inform her tha 30 day supply was sent. She wants it sent to  [place] ([address]), [place] Phone:  [phone]  Fax:  [phone]

## 2021-08-25 NOTE — Telephone Encounter (Signed)
Pt called in stating she just had an appt, and PCP told her the authorization was good when she came in quoted "august" appt, I did not see any appts in august, but pt is requesting a call back because she has not been on this medication in months. Please advise.

## 2021-08-26 ENCOUNTER — Other Ambulatory Visit: Payer: Self-pay

## 2021-08-26 ENCOUNTER — Other Ambulatory Visit: Payer: Self-pay | Admitting: Family Medicine

## 2021-08-26 DIAGNOSIS — F902 Attention-deficit hyperactivity disorder, combined type: Secondary | ICD-10-CM

## 2021-08-26 MED ORDER — AMPHETAMINE-DEXTROAMPHET ER 20 MG PO CP24: 20.0000 mg | ORAL_CAPSULE | Freq: Every day | ORAL | 0 refills | Status: DC

## 2021-08-26 NOTE — Telephone Encounter (Signed)
Spoke with patient and explained Adderall is a controlled medication and a strict schedule must be followed in order to comply with government regulations. She has to be seen every 3 months in order to have prescriptions for this medication. She cannot "hold" them and not take as prescribed and get them off schedule as it was not prescribed PRN. Patient was very agitated and would not verbalize understanding. Every effort was made to explain/offer assistance. She was advised her medication was sent to [facility] per her request but if she wanted further refill she absolutely has to be seen next month.

## 2021-09-02 ENCOUNTER — Other Ambulatory Visit: Payer: Self-pay | Admitting: *Deleted

## 2021-09-02 ENCOUNTER — Telehealth: Payer: Self-pay | Admitting: *Deleted

## 2021-09-02 DIAGNOSIS — R110 Nausea: Secondary | ICD-10-CM

## 2021-09-02 DIAGNOSIS — G8929 Other chronic pain: Secondary | ICD-10-CM

## 2021-09-02 DIAGNOSIS — R1011 Right upper quadrant pain: Secondary | ICD-10-CM

## 2021-09-02 DIAGNOSIS — C50511 Malignant neoplasm of lower-outer quadrant of right female breast: Secondary | ICD-10-CM

## 2021-09-02 DIAGNOSIS — Z170 Estrogen receptor positive status [ER+]: Secondary | ICD-10-CM

## 2021-09-02 NOTE — Telephone Encounter (Signed)
Referral for GI entered.

## 2021-09-02 NOTE — Telephone Encounter (Signed)
Patient called stating that she tried to get apt with [facility] as  per Dr B request for her to see GI , but was told that we need to send a referral to them for her to get an appointment.

## 2021-10-17 ENCOUNTER — Ambulatory Visit
Admission: EM | Admit: 2021-10-17 | Discharge: 2021-10-17 | Disposition: A | Discharge status: Home / Self Care | Payer: Managed Care, Other (non HMO) | Attending: Physician Assistant | Admitting: Physician Assistant

## 2021-10-17 ENCOUNTER — Encounter: Payer: Self-pay | Admitting: Emergency Medicine

## 2021-10-17 ENCOUNTER — Other Ambulatory Visit: Payer: Self-pay

## 2021-10-17 DIAGNOSIS — J209 Acute bronchitis, unspecified: Secondary | ICD-10-CM

## 2021-10-17 DIAGNOSIS — R0602 Shortness of breath: Secondary | ICD-10-CM | POA: Diagnosis not present

## 2021-10-17 DIAGNOSIS — R051 Acute cough: Secondary | ICD-10-CM

## 2021-10-17 MED ORDER — DOXYCYCLINE HYCLATE 100 MG PO CAPS: 100.0000 mg | ORAL_CAPSULE | Freq: Two times a day (BID) | ORAL | 0 refills | Status: AC

## 2021-10-17 MED ORDER — PROMETHAZINE-DM 6.25-15 MG/5ML PO SYRP: 5.0000 mL | ORAL_SOLUTION | Freq: Four times a day (QID) | ORAL | 0 refills | Status: DC | PRN

## 2021-10-17 MED ORDER — PREDNISONE 20 MG PO TABS: 40.0000 mg | ORAL_TABLET | Freq: Every day | ORAL | 0 refills | Status: AC

## 2021-10-17 MED ORDER — ALBUTEROL SULFATE HFA 108 (90 BASE) MCG/ACT IN AERS: 1.0000 | INHALATION_SPRAY | Freq: Four times a day (QID) | RESPIRATORY_TRACT | 0 refills | Status: DC | PRN

## 2021-10-17 NOTE — Discharge Instructions (Signed)
-  I have sent antibiotics, prednisone, inhaler and cough medicine the pharmacy.  You have bronchitis.  As we discussed it is often viral but given your history, we will cover you with antibiotics. - If you have a fever, worsening cough or increased breathing difficulty you should be seen again.  Go to emergency department for any severe acute worsening of her symptoms.

## 2021-10-17 NOTE — ED Provider Notes (Signed)
[facility]    CSN: [id number] Arrival date & time: 10/17/21  1010      History   Chief Complaint Chief Complaint  Patient presents with   Cough   Shortness of Breath    HPI Shelby Barry is a 55 y.o. female presenting for 5 to 6-day history of cough that has now become productive of yellowish sputum, fatigue, wheezing and shortness of breath.  Symptoms have worsened from onset.  No sick contacts or known exposure to COVID-19 or influenza.  Patient says she does not have a fever.  Patient reports a history of bronchitis but says its been a while since she has had bronchitis.  In her medical history does list COPD and asthma but patient denies both.  She does however have an albuterol inhaler to use as needed but says it is expired.  Has not been using them.  Has been trying over-the-counter cough medicine without improvement in symptoms.  Other past medical history significant for breast cancer.  Former smoker. No other complaints today.  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, estrogen receptor positive ([place])    Cataract, bilateral    Chemotherapy induced nausea and vomiting    Cold    states she has had it for 2 months   Depression    GERD (gastroesophageal reflux disease)    uses baking soda for symptoms   Insomnia    Lumbago    Personal history of chemotherapy 11/22/2016   Personal history of radiation therapy 01/20/2017   Vitamin D deficiency     Patient Active Problem List   Diagnosis Date Noted   Obesity (BMI 30.0-34.9) 05/18/2018   Liver lesion 11/09/2016   Chronic bronchitis ([place]) 10/29/2016   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, estrogen receptor positive ([place]) 10/26/2016   Recurrent major depression-severe ([place]) 08/22/2015    Bursitis of shoulder 08/22/2015   Bilateral cataracts 08/22/2015   Circadian rhythm sleep disorder, 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   Vitamin D deficiency 08/22/2015   LBP (low back pain) 08/22/2015   GAD (generalized anxiety disorder) 08/22/2015   History of anemia 08/17/2007   Insomnia 08/16/2007    Past Surgical History:  Procedure Laterality Date   ABDOMINAL HYSTERECTOMY  04/20/2014   BILATERAL SALPINGECTOMY     BREAST BIOPSY Right 09/27/2016   positive   BREAST LUMPECTOMY Right 10/12/2016   positive   BREAST LUMPECTOMY WITH SENTINEL LYMPH NODE BIOPSY Right 10/12/2016   Procedure: BREAST LUMPECTOMY WITH SENTINEL LYMPH NODE BX;  Surgeon: Christene Lye, MD;  Location: [facility];  Service: General;  Laterality: Right;   COLONOSCOPY WITH PROPOFOL N/A 05/20/2016   Procedure: COLONOSCOPY WITH PROPOFOL;  Surgeon: Lucilla Lame, MD;  Location: [facility];  Service: Endoscopy;  Laterality: N/A;   ECTOPIC PREGNANCY SURGERY     ENDOMETRIAL ABLATION     PORTACATH PLACEMENT Left 11/04/2016   Procedure: INSERTION PORT-A-CATH;  Surgeon: Christene Lye, MD;  Location: [facility];  Service: General;  Laterality: Left;  Left subclavian vein   TUBAL LIGATION      OB History     Gravida  3   Para  2   Term  2   Preterm  AB  1   Living  2      SAB      IAB      Ectopic  1   Multiple  0   Live Births           Obstetric Comments  1st Menstrual Cycle:  13 1st Pregnancy:  18           Home Medications    Prior to Admission medications   Medication Sig Start Date End Date Taking? Authorizing Provider  albuterol (VENTOLIN HFA) 108 (90 Base) MCG/ACT inhaler Inhale 1-2 puffs into the lungs every 6 (six) hours as needed for wheezing or shortness of breath. 10/17/21  Yes Danton Clap, PA-C  amphetamine-dextroamphetamine (ADDERALL XR) 20 MG 24 hr capsule Take 1 capsule (20 mg total) by mouth daily. 08/26/21  Yes Sowles, Drue Stager, MD  doxycycline  (VIBRAMYCIN) 100 MG capsule Take 1 capsule (100 mg total) by mouth 2 (two) times daily for 7 days. 10/17/21 10/24/21 Yes Danton Clap, PA-C  loratadine (CLARITIN) 10 MG tablet Take 1 tablet (10 mg total) by mouth daily. 02/27/20  Yes Sowles, Drue Stager, MD  meloxicam (MOBIC) 15 MG tablet TAKE 1 TABLET BY MOUTH  DAILY 08/02/18  Yes Sowles, Drue Stager, MD  Multiple Vitamin (MULTIVITAMIN WITH MINERALS) TABS tablet Take 1 tablet by mouth daily. One-A-Day   Yes [provider]  omeprazole (PRILOSEC) 20 MG capsule Take 1 capsule (20 mg total) by mouth daily. 06/16/21  Yes Sowles, Drue Stager, MD  predniSONE (DELTASONE) 20 MG tablet Take 2 tablets (40 mg total) by mouth daily for 5 days. 10/17/21 10/22/21 Yes Danton Clap, PA-C  promethazine-dextromethorphan (PROMETHAZINE-DM) 6.25-15 MG/5ML syrup Take 5 mLs by mouth 4 (four) times daily as needed for cough. 10/17/21  Yes Danton Clap, PA-C  acetaminophen (TYLENOL) 325 MG tablet Take 325 mg by mouth every 6 (six) hours as needed for fever. Patient not taking: Reported on 07/31/2021    [provider]  sertraline (ZOLOFT) 25 MG tablet Take 1 tablet (25 mg total) by mouth daily. 06/16/21   Steele Sizer, MD  zolpidem (AMBIEN) 5 MG tablet Take 1 tablet (5 mg total) by mouth at bedtime as needed for sleep. 06/16/21   Steele Sizer, MD    Family History Family History  Problem Relation Age of Onset   Heart disease Mother    Depression Father    Obesity Father    Diabetes Father    Alcohol abuse Father    COPD Father    Alcohol abuse Brother    Seizures Brother    Breast cancer Sister 109    Social History Social History   Tobacco Use   Smoking status: Former    Packs/day: 0.50    Years: 20.00    Pack years: 10.00    Types: Cigarettes    Quit date: 08/23/2016    Years since quitting: 5.1   Smokeless tobacco: Never  Vaping Use   Vaping Use: Never used  Substance Use Topics   Alcohol use: Yes    Alcohol/week: 0.0 standard drinks     Comment: occassional   Drug use: No     Allergies   Adhesive [tape] and Nickel   Review of Systems Review of Systems  Constitutional:  Positive for fatigue. Negative for chills, diaphoresis and fever.  HENT:  Positive for congestion. Negative for ear pain, rhinorrhea, sinus pressure, sinus pain and sore throat.   Respiratory:  Positive for cough, shortness of breath and  wheezing.   Cardiovascular:  Negative for chest pain and leg swelling.  Gastrointestinal:  Negative for abdominal pain, nausea and vomiting.  Musculoskeletal:  Positive for myalgias. Negative for arthralgias.  Skin:  Negative for rash.  Neurological:  Negative for weakness and headaches.  Hematological:  Negative for adenopathy.    Physical Exam Triage Vital Signs ED Triage Vitals  Enc Vitals Group     BP 10/17/21 1059 125/63     Pulse Rate 10/17/21 1059 86     Resp 10/17/21 1059 15     Temp 10/17/21 1059 98.1 F (36.7 C)     Temp Source 10/17/21 1059 Oral     SpO2 10/17/21 1059 97 %     Weight 10/17/21 1056 174 lb (78.9 kg)     Height 10/17/21 1056 5' 1.5" (1.562 m)     Head Circumference --      Peak Flow --      Pain Score 10/17/21 1056 0     Pain Loc --      Pain Edu? --      Excl. in [place]? --    No data found.  Updated Vital Signs BP 125/63 (BP Location: Left Arm)   Pulse 86   Temp 98.1 F (36.7 C) (Oral)   Resp 15   Ht 5' 1.5" (1.562 m)   Wt 174 lb (78.9 kg)   SpO2 97%   BMI 32.34 kg/m      Physical Exam Vitals and nursing note reviewed.  Constitutional:      General: She is not in acute distress.    Appearance: Normal appearance. She is well-developed. She is ill-appearing. She is not toxic-appearing.  HENT:     Head: Normocephalic and atraumatic.     Nose: Congestion present.     Mouth/Throat:     Mouth: Mucous membranes are moist.     Pharynx: Oropharynx is clear.  Eyes:     General: No scleral icterus.       Right eye: No discharge.        Left eye: No discharge.      Conjunctiva/sclera: Conjunctivae normal.  Cardiovascular:     Rate and Rhythm: Normal rate and regular rhythm.     Heart sounds: Normal heart sounds.  Pulmonary:     Effort: Pulmonary effort is normal. No respiratory distress.     Breath sounds: Wheezing and rhonchi present.     Comments: Diffuse wheezing and rhonchi throughout all lung fields. Musculoskeletal:     Cervical back: Neck supple.  Skin:    General: Skin is dry.  Neurological:     General: No focal deficit present.     Mental Status: She is alert. Mental status is at baseline.     Motor: No weakness.     Gait: Gait normal.  Psychiatric:        Mood and Affect: Mood normal.        Behavior: Behavior normal.        Thought Content: Thought content normal.     UC Treatments / Results  Labs (all labs ordered are listed, but only abnormal results are displayed) Labs Reviewed - No data to display  EKG   Radiology No results found.  Procedures Procedures (including critical care time)  Medications Ordered in UC Medications - No data to display  Initial Impression / Assessment and Plan / UC Course  I have reviewed the triage vital signs and the nursing notes.  Pertinent labs & imaging  results that were available during my care of the patient were reviewed by me and considered in my medical decision making (see chart for details).   55 year old female presenting for 5 to 6-day history of productive cough, nasal congestion, fatigue, wheezing and shortness of breath.  Vitals are stable.  She is ill-appearing but nontoxic.  Nasal congestion on exam as well as diffuse wheezing and rhonchi throughout chest.  She also coughs many times during exam.  Review of her chart does show mention of asthma and COPD but patient denies it to me.  We will cover her for potential COPD exacerbation with doxycycline, prednisone and I have refilled her albuterol inhaler and also sent in Promethazine DM for cough.  Advised supportive  care with increasing rest and fluids.  Reviewed return and ED precautions.   Final Clinical Impressions(s) / UC Diagnoses   Final diagnoses:  Acute bronchitis, unspecified organism  Acute cough  Shortness of breath     Discharge Instructions      -I have sent antibiotics, prednisone, inhaler and cough medicine the pharmacy.  You have bronchitis.  As we discussed it is often viral but given your history, we will cover you with antibiotics. - If you have a fever, worsening cough or increased breathing difficulty you should be seen again.  Go to emergency department for any severe acute worsening of her symptoms.     ED Prescriptions     Medication Sig Dispense Auth. Provider   doxycycline (VIBRAMYCIN) 100 MG capsule Take 1 capsule (100 mg total) by mouth 2 (two) times daily for 7 days. 14 capsule Danton Clap, PA-C   promethazine-dextromethorphan (PROMETHAZINE-DM) 6.25-15 MG/5ML syrup Take 5 mLs by mouth 4 (four) times daily as needed for cough. 118 mL Laurene Footman B, PA-C   predniSONE (DELTASONE) 20 MG tablet Take 2 tablets (40 mg total) by mouth daily for 5 days. 10 tablet Laurene Footman B, PA-C   albuterol (VENTOLIN HFA) 108 (90 Base) MCG/ACT inhaler Inhale 1-2 puffs into the lungs every 6 (six) hours as needed for wheezing or shortness of breath. 1 g Danton Clap, PA-C      PDMP not reviewed this encounter.   Danton Clap, PA-C 10/17/21 1245

## 2021-10-17 NOTE — ED Triage Notes (Signed)
Patient c/o cough, chest congestion and SOB that started on Monday.  Patient denies fevers.

## 2021-10-26 ENCOUNTER — Telehealth: Payer: Self-pay | Admitting: Family Medicine

## 2021-10-26 DIAGNOSIS — F902 Attention-deficit hyperactivity disorder, combined type: Secondary | ICD-10-CM

## 2021-10-26 DIAGNOSIS — F339 Major depressive disorder, recurrent, unspecified: Secondary | ICD-10-CM

## 2021-10-26 MED ORDER — SERTRALINE HCL 25 MG PO TABS: 25.0000 mg | ORAL_TABLET | Freq: Every day | ORAL | 0 refills | Status: DC

## 2021-10-26 NOTE — Telephone Encounter (Signed)
Requested medication (s) are due for refill today: yes  Requested medication (s) are on the active medication list: yes   Last refill:08/26/21 #30  0 refills  Future visit scheduled yes  11/20/21  Notes to clinic: Not delegated  Requested Prescriptions  Pending Prescriptions Disp Refills   amphetamine-dextroamphetamine (ADDERALL XR) 20 MG 24 hr capsule 30 capsule 0    Sig: Take 1 capsule (20 mg total) by mouth daily.     Not Delegated - Psychiatry:  Stimulants/ADHD Failed - 10/26/2021 10:20 AM      Failed - This refill cannot be delegated      Failed - Urine Drug Screen completed in last 360 days      Failed - Valid encounter within last 3 months    Recent Outpatient Visits           4 months ago COPD with asthma [facility])   [facility] Steele Sizer, MD   7 months ago COPD with asthma [facility])   [facility] Steele Sizer, MD   10 months ago COPD with asthma [facility])   [facility] Steele Sizer, MD   1 year ago GERD without esophagitis   [facility] Steele Sizer, MD   1 year ago Attention deficit hyperactivity disorder (ADHD), combined type   [facility] Steele Sizer, MD       Future Appointments             In 3 weeks Steele Sizer, MD [facility]            Signed Prescriptions Disp Refills   sertraline (ZOLOFT) 25 MG tablet 30 tablet 0    Sig: Take 1 tablet (25 mg total) by mouth daily.     Psychiatry:  Antidepressants - SSRI Passed - 10/26/2021 10:20 AM      Passed - Completed PHQ-2 or PHQ-9 in the last 360 days      Passed - Valid encounter within last 6 months    Recent Outpatient Visits           4 months ago COPD with asthma [facility])   [facility] Steele Sizer, MD   7 months ago COPD with asthma [facility])   [facility] Steele Sizer, MD   10 months ago COPD with asthma [facility])   [facility] Steele Sizer, MD   1 year ago GERD without esophagitis   [facility] Steele Sizer, MD   1 year ago Attention deficit hyperactivity disorder (ADHD), combined type   [facility] Steele Sizer, MD       Future Appointments             In 3 weeks Steele Sizer, MD [facility] [place]               c

## 2021-10-26 NOTE — Telephone Encounter (Signed)
Copied from [place] [id number]. Topic: Quick Communication - Rx Refill/Question >> Oct 26, 2021  9:21 AM Leward Quan A wrote: Medication: amphetamine-dextroamphetamine (ADDERALL XR) 20 MG 24 hr capsule, sertraline (ZOLOFT) 25 MG tablet    Has the patient contacted their pharmacy? Yes.  Need new Rx  (Agent: If no, request that the patient contact the pharmacy for the refill. If patient does not wish to contact the pharmacy document the reason why and proceed with request.) (Agent: If yes, when and what did the pharmacy advise?)  Preferred Pharmacy (with phone number or street name): [facility] [employer] [facility] [place] - [address]  Phone:  [phone] Fax:  [phone]    Has the patient been seen for an appointment in the last year OR does the patient have an upcoming appointment? Yes.    Agent: Please be advised that RX refills may take up to 3 business days. We ask that you follow-up with your pharmacy.

## 2021-11-02 ENCOUNTER — Telehealth: Payer: Self-pay

## 2021-11-02 NOTE — Telephone Encounter (Signed)
called pt to reschedule, vm is full & couldn't leave message/ sent letter in the mail 

## 2021-11-12 ENCOUNTER — Ambulatory Visit: Payer: Managed Care, Other (non HMO) | Admitting: Gastroenterology

## 2021-11-17 ENCOUNTER — Other Ambulatory Visit: Payer: Self-pay

## 2021-11-17 ENCOUNTER — Ambulatory Visit
Admission: RE | Admit: 2021-11-17 | Discharge: 2021-11-17 | Disposition: A | Discharge status: Home / Self Care | Payer: Managed Care, Other (non HMO) | Source: Ambulatory Visit | Attending: Internal Medicine | Admitting: Internal Medicine

## 2021-11-17 DIAGNOSIS — Z170 Estrogen receptor positive status [ER+]: Secondary | ICD-10-CM | POA: Diagnosis present

## 2021-11-17 DIAGNOSIS — C50511 Malignant neoplasm of lower-outer quadrant of right female breast: Secondary | ICD-10-CM | POA: Diagnosis present

## 2021-11-19 NOTE — Progress Notes (Signed)
Name: Shelby Barry   MRN: [id number]    DOB: [birth year]   Date:11/20/2021       Progress Note  Subjective  Chief Complaint  Medication Refill  HPI  ADD combined type: she told me she always felt that she had ADD so we referred her for formal testing , she saw [facility] in [place] and the diagnosis was positive for ADD She feels overwhelmed all the time, she is always pacing, busy at home, worries about not being able to stay focused on tasks She is on Adderal XR 20 mg, she only takes when she wakes up very early. She states medication helps her finishing what she starts that she was unable to do in the past .    Major depression chronic recurrent: she has a long history of depression over the years, her father committed suicide when she was in her 16's. She is renting her house, she stays in [place] with her son and the days she not working  and she stays with her sister in [place].when she is working   She placed her mother in the nursing home Cannot tolerate Lexapro, she was taking zoloft helped with symptoms but ran out medication and would like a refill.  She is on Ambien now and is able to fall and stay asleep , no side effects    History of breast cancer: still seeing hematologist, stopped anastrozole months ago, tried Tamoxifen for about 6 months but also stopped in 2019 on her own . She is up to date with mammogram, last mammogram was 12/22, she gets concerned when develops pain.    GERD: she has been taking Omeprazole daily, she states she has heartburn and epigastric pain if she skips one dose of medication. Stable.    Chronic bronchitis/asthma: she had a severe flare in Nov, went to urgent care, negative for flu and COVID-19. She was given prednisone and cough syrup. Currently doing well, no wheezing or cough. Denies SOB  RUQ pain and nausea: happening when she eats, oncologist ordered US that showed two small gallbladder polyps and she was referred to GI, however  appointment was re-schedule for Feb 2023. She has decrease portions to control the nausea. 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, estrogen receptor positive ([place]) 10/26/2016   Recurrent major depression-severe ([place]) 08/22/2015   Bursitis of shoulder 08/22/2015   Bilateral cataracts 08/22/2015   Circadian rhythm sleep disorder, 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   Vitamin D deficiency 08/22/2015   LBP (low back pain) 08/22/2015   GAD (generalized anxiety disorder) 08/22/2015   History of anemia 08/17/2007   Insomnia 08/16/2007    Past Surgical History:  Procedure Laterality Date   ABDOMINAL HYSTERECTOMY  04/20/2014   BILATERAL SALPINGECTOMY     BREAST BIOPSY Right 09/27/2016   positive   BREAST LUMPECTOMY Right 10/12/2016   positive   BREAST LUMPECTOMY WITH SENTINEL LYMPH NODE BIOPSY Right 10/12/2016   Procedure: BREAST LUMPECTOMY WITH SENTINEL LYMPH NODE BX;  Surgeon: Christene Lye, MD;  Location: [facility];  Service: General;  Laterality: Right;   COLONOSCOPY WITH PROPOFOL N/A 05/20/2016   Procedure: COLONOSCOPY WITH PROPOFOL;  Surgeon: Lucilla Lame, MD;  Location: [place];  Service: Endoscopy;  Laterality: N/A;   ECTOPIC PREGNANCY SURGERY  ENDOMETRIAL ABLATION     PORTACATH PLACEMENT Left 11/04/2016   Procedure: INSERTION PORT-A-CATH;  Surgeon: Christene Lye, MD;  Location: [facility];  Service: General;  Laterality: Left;  Left subclavian vein   TUBAL LIGATION      Family History  Problem Relation Age of Onset   Heart disease Mother    Depression Father    Obesity Father    Diabetes Father    Alcohol abuse Father    COPD Father    Alcohol abuse Brother    Seizures Brother    Breast cancer Sister 4    Social History   Tobacco Use   Smoking  status: Former    Packs/day: 0.50    Years: 20.00    Pack years: 10.00    Types: Cigarettes    Quit date: 08/23/2016    Years since quitting: 5.2   Smokeless tobacco: Never  Substance Use Topics   Alcohol use: Yes    Alcohol/week: 0.0 standard drinks    Comment: occassional     Current Outpatient Medications:    acetaminophen (TYLENOL) 325 MG tablet, Take 325 mg by mouth every 6 (six) hours as needed for fever., Disp: , Rfl:    albuterol (VENTOLIN HFA) 108 (90 Base) MCG/ACT inhaler, Inhale 1-2 puffs into the lungs every 6 (six) hours as needed for wheezing or shortness of breath., Disp: 1 g, Rfl: 0   amphetamine-dextroamphetamine (ADDERALL XR) 20 MG 24 hr capsule, Take 1 capsule (20 mg total) by mouth daily., Disp: 30 capsule, Rfl: 0   loratadine (CLARITIN) 10 MG tablet, Take 1 tablet (10 mg total) by mouth daily., Disp: 30 tablet, Rfl: 2   meloxicam (MOBIC) 15 MG tablet, TAKE 1 TABLET BY MOUTH  DAILY, Disp: 90 tablet, Rfl: 0   omeprazole (PRILOSEC) 20 MG capsule, Take 1 capsule (20 mg total) by mouth daily., Disp: 90 capsule, Rfl: 1   sertraline (ZOLOFT) 25 MG tablet, Take 1 tablet (25 mg total) by mouth daily., Disp: 30 tablet, Rfl: 0   zolpidem (AMBIEN) 5 MG tablet, Take 1 tablet (5 mg total) by mouth at bedtime as needed for sleep., Disp: 45 tablet, Rfl: 2   Multiple Vitamin (MULTIVITAMIN WITH MINERALS) TABS tablet, Take 1 tablet by mouth daily. One-A-Day (Patient not taking: Reported on 11/20/2021), Disp: , Rfl:    promethazine-dextromethorphan (PROMETHAZINE-DM) 6.25-15 MG/5ML syrup, Take 5 mLs by mouth 4 (four) times daily as needed for cough. (Patient not taking: Reported on 11/20/2021), Disp: 118 mL, Rfl: 0  Allergies  Allergen Reactions   Adhesive [Tape] Other (See Comments)    Blisters/rash  PLEASE USE PAPER TAPE ONLY   Nickel     PT CAN WEAR GOLD, STERLING SILVER WITH NO PROBLEM-PT HAS NEVER HAD ANY PROBLEMS IN THE OR    I personally reviewed active problem list,  medication list, allergies, family history, social history, health maintenance, with the patient/caregiver today.   ROS  Constitutional: Negative for fever or weight change.  Respiratory: Negative for cough and shortness of breath.   Cardiovascular: Negative for chest pain or palpitations.  Gastrointestinal: Negative for abdominal pain, no bowel changes.  Musculoskeletal: Negative for gait problem or joint swelling.  Skin: Negative for rash.  Neurological: Negative for dizziness or headache.  No other specific complaints in a complete review of systems (except as listed in HPI above).   Objective  Vitals:   11/20/21 1304  BP: 128/82  Pulse: 91  Resp: 16  Temp: 98.1 F (36.7 C)  SpO2: 97%  Weight: 176 lb (79.8 kg)  Height: 5\' 1"  (1.549 m)    Body mass index is 33.25 kg/m.  Physical Exam  Constitutional: Patient appears well-developed and well-nourished. Obese  No distress.  HEENT: head atraumatic, normocephalic, pupils equal and reactive to light, neck supple Cardiovascular: Normal rate, regular rhythm and normal heart sounds.  No murmur heard. No BLE edema. Pulmonary/Chest: Effort normal and breath sounds normal. No respiratory distress. Abdominal: Soft.  There is point  tenderness on right flank, negative CVA normal bowel sounds . Psychiatric: Patient has a normal mood and affect. behavior is normal. Judgment and thought content normal.   PHQ2/9: Depression screen [facility] 2/9 11/20/2021 06/16/2021 03/17/2021 12/16/2020 07/14/2020  Decreased Interest 0 0 1 0 1  Down, Depressed, Hopeless 0 0 0 0 1  PHQ - 2 Score 0 0 1 0 2  Altered sleeping 3 3 3  - 1  Tired, decreased energy 0 3 1 - 1  Change in appetite 0 0 0 - 0  Feeling bad or failure about yourself  0 0 0 - 0  Trouble concentrating 0 0 0 - 0  Moving slowly or fidgety/restless 0 0 0 - 0  Suicidal thoughts 0 0 0 - 0  PHQ-9 Score 3 6 5  - 4  Difficult doing work/chores - - - - Not difficult at all  Some recent data might  be hidden    phq 9 is negative   Fall Risk: Fall Risk  11/20/2021 06/16/2021 03/17/2021 12/16/2020 07/14/2020  Falls in the past year? 0 0 0 0 0  Number falls in past yr: 0 0 0 0 0  Injury with Fall? 0 0 0 0 0  Risk for fall due to : No Fall Risks - - - -  Follow up Falls prevention discussed - - - -      Functional Status Survey: Is the patient deaf or have difficulty hearing?: No Does the patient have difficulty seeing, even when wearing glasses/contacts?: No Does the patient have difficulty concentrating, remembering, or making decisions?: No Does the patient have difficulty walking or climbing stairs?: No Does the patient have difficulty dressing or bathing?: No Does the patient have difficulty doing errands alone such as visiting a doctor's office or shopping?: No    Assessment & Plan  1. COPD with asthma ([place])   2. Attention deficit hyperactivity disorder (ADHD), combined type  - amphetamine-dextroamphetamine (ADDERALL XR) 20 MG 24 hr capsule; Take 1 capsule (20 mg total) by mouth daily.  Dispense: 90 capsule; Refill: 0  3. GERD without esophagitis  4. Major depression, recurrent, chronic (HCC)  - sertraline (ZOLOFT) 50 MG tablet; Take 1 tablet (50 mg total) by mouth daily.  Dispense: 90 tablet; Refill: 0  5. Vitamin D deficiency  - VITAMIN D 25 Hydroxy (Vit-D Deficiency, Fractures)  6. Psychophysiological insomnia  - zolpidem (AMBIEN) 5 MG tablet; Take 1 tablet (5 mg total) by mouth at bedtime as needed for sleep.  Dispense: 90 tablet; Refill: 0  7. Obesity (BMI 30.0-34.9)  Discussed with the patient the risk posed by an increased BMI. Discussed importance of portion control, calorie counting and at least 150 minutes of physical activity weekly. Avoid sweet beverages and drink more water. Eat at least 6 servings of fruit and vegetables daily    8. Other fatigue  - CBC with Differential/Platelet - COMPLETE METABOLIC PANEL WITH GFR - TSH - Vitamin B12  9.  Hyperglycemia  - Hemoglobin A1c  10. Lipid screening  -  Lipid panel    11. RUQ pain  Keep follow up with GI

## 2021-11-20 ENCOUNTER — Encounter: Payer: Self-pay | Admitting: Family Medicine

## 2021-11-20 ENCOUNTER — Other Ambulatory Visit: Payer: Self-pay

## 2021-11-20 ENCOUNTER — Ambulatory Visit: Payer: Managed Care, Other (non HMO) | Admitting: Family Medicine

## 2021-11-20 ENCOUNTER — Other Ambulatory Visit: Payer: Self-pay | Admitting: Family Medicine

## 2021-11-20 VITALS — BP 128/82 | HR 91 | Temp 98.1°F | Resp 16 | Ht 61.0 in | Wt 176.0 lb

## 2021-11-20 DIAGNOSIS — F902 Attention-deficit hyperactivity disorder, combined type: Secondary | ICD-10-CM | POA: Diagnosis not present

## 2021-11-20 DIAGNOSIS — E559 Vitamin D deficiency, unspecified: Secondary | ICD-10-CM

## 2021-11-20 DIAGNOSIS — E669 Obesity, unspecified: Secondary | ICD-10-CM

## 2021-11-20 DIAGNOSIS — Z13220 Encounter for screening for lipoid disorders: Secondary | ICD-10-CM

## 2021-11-20 DIAGNOSIS — F339 Major depressive disorder, recurrent, unspecified: Secondary | ICD-10-CM | POA: Diagnosis not present

## 2021-11-20 DIAGNOSIS — K219 Gastro-esophageal reflux disease without esophagitis: Secondary | ICD-10-CM | POA: Diagnosis not present

## 2021-11-20 DIAGNOSIS — J449 Chronic obstructive pulmonary disease, unspecified: Secondary | ICD-10-CM | POA: Diagnosis not present

## 2021-11-20 DIAGNOSIS — R739 Hyperglycemia, unspecified: Secondary | ICD-10-CM

## 2021-11-20 DIAGNOSIS — J4489 Other specified chronic obstructive pulmonary disease: Secondary | ICD-10-CM

## 2021-11-20 DIAGNOSIS — E66811 Obesity, class 1: Secondary | ICD-10-CM

## 2021-11-20 DIAGNOSIS — R5383 Other fatigue: Secondary | ICD-10-CM

## 2021-11-20 DIAGNOSIS — F5104 Psychophysiologic insomnia: Secondary | ICD-10-CM

## 2021-11-20 DIAGNOSIS — R1011 Right upper quadrant pain: Secondary | ICD-10-CM

## 2021-11-20 MED ORDER — AMPHETAMINE-DEXTROAMPHET ER 20 MG PO CP24
20.0000 mg | ORAL_CAPSULE | Freq: Every day | ORAL | 0 refills | Status: DC
Start: 2021-11-20 — End: 2021-11-20

## 2021-11-20 MED ORDER — SERTRALINE HCL 50 MG PO TABS: 50.0000 mg | ORAL_TABLET | Freq: Every day | ORAL | 0 refills | Status: DC

## 2021-11-20 MED ORDER — ZOLPIDEM TARTRATE 5 MG PO TABS: 5.0000 mg | ORAL_TABLET | Freq: Every evening | ORAL | 0 refills | Status: DC | PRN

## 2021-11-20 MED ORDER — AMPHETAMINE-DEXTROAMPHET ER 20 MG PO CP24: 20.0000 mg | ORAL_CAPSULE | Freq: Every day | ORAL | 0 refills | Status: DC

## 2021-11-20 MED ORDER — AMPHETAMINE-DEXTROAMPHET ER 20 MG PO CP24
20.0000 mg | ORAL_CAPSULE | ORAL | 0 refills | Status: DC
Start: 2021-11-20 — End: 2021-12-29

## 2021-11-20 NOTE — Telephone Encounter (Signed)
Pharmacist, rebecca from [facility], states they are only able to fill 30 pills of adderal, for insurance to cover.

## 2021-11-21 ENCOUNTER — Encounter: Payer: Self-pay | Admitting: Internal Medicine

## 2021-11-21 LAB — COMPLETE METABOLIC PANEL WITH GFR
AG Ratio: 2.1 (calc) (ref 1.0–2.5)
ALT: 20 U/L (ref 6–29)
AST: 19 U/L (ref 10–35)
Albumin: 4.2 g/dL (ref 3.6–5.1)
Alkaline phosphatase (APISO): 97 U/L (ref 37–153)
BUN: 10 mg/dL (ref 7–25)
CO2: 28 mmol/L (ref 20–32)
Calcium: 9.2 mg/dL (ref 8.6–10.4)
Chloride: 105 mmol/L (ref 98–110)
Creat: 0.74 mg/dL (ref 0.50–1.03)
Globulin: 2 g/dL (calc) (ref 1.9–3.7)
Glucose, Bld: 88 mg/dL (ref 65–99)
Potassium: 4.4 mmol/L (ref 3.5–5.3)
Sodium: 141 mmol/L (ref 135–146)
Total Bilirubin: 0.4 mg/dL (ref 0.2–1.2)
Total Protein: 6.2 g/dL (ref 6.1–8.1)
eGFR: 95 mL/min/{1.73_m2} (ref >=60)

## 2021-11-21 LAB — TSH: TSH: 1.46 mIU/L

## 2021-11-21 LAB — CBC WITH DIFFERENTIAL/PLATELET
Absolute Monocytes: 476 cells/uL (ref 200–950)
Basophils Absolute: 42 cells/uL (ref 0–200)
Basophils Relative: 0.6 %
Eosinophils Absolute: 168 cells/uL (ref 15–500)
Eosinophils Relative: 2.4 %
HCT: 41.5 % (ref 35.0–45.0)
Hemoglobin: 13.4 g/dL (ref 11.7–15.5)
Lymphs Abs: 2436 cells/uL (ref 850–3900)
MCH: 26.7 pg — ABNORMAL LOW (ref 27.0–33.0)
MCHC: 32.3 g/dL (ref 32.0–36.0)
MCV: 82.7 fL (ref 80.0–100.0)
MPV: 9.1 fL (ref 7.5–12.5)
Monocytes Relative: 6.8 %
Neutro Abs: 3878 cells/uL (ref 1500–7800)
Neutrophils Relative %: 55.4 %
Platelets: 379 10*3/uL (ref 140–400)
RBC: 5.02 10*6/uL (ref 3.80–5.10)
RDW: 13.9 % (ref 11.0–15.0)
Total Lymphocyte: 34.8 %
WBC: 7 10*3/uL (ref 3.8–10.8)

## 2021-11-21 LAB — LIPID PANEL
Cholesterol: 199 mg/dL (ref <200)
HDL: 72 mg/dL (ref >=50)
LDL Cholesterol (Calc): 104 mg/dL (calc) — ABNORMAL HIGH
Non-HDL Cholesterol (Calc): 127 mg/dL (calc) (ref <130)
Total CHOL/HDL Ratio: 2.8 (calc) (ref <5.0)
Triglycerides: 136 mg/dL (ref <150)

## 2021-11-21 LAB — VITAMIN B12: Vitamin B-12: 454 pg/mL (ref 200–1100)

## 2021-11-21 LAB — HEMOGLOBIN A1C
Hgb A1c MFr Bld: 5.3 % of total Hgb (ref <5.7)
Mean Plasma Glucose: 105 mg/dL
eAG (mmol/L): 5.8 mmol/L

## 2021-11-21 LAB — VITAMIN D 25 HYDROXY (VIT D DEFICIENCY, FRACTURES): Vit D, 25-Hydroxy: 26 ng/mL — ABNORMAL LOW (ref 30–100)

## 2021-11-25 ENCOUNTER — Ambulatory Visit: Payer: Managed Care, Other (non HMO) | Admitting: Internal Medicine

## 2021-11-25 ENCOUNTER — Other Ambulatory Visit: Payer: Managed Care, Other (non HMO)

## 2021-12-29 ENCOUNTER — Telehealth: Payer: Self-pay

## 2021-12-29 ENCOUNTER — Other Ambulatory Visit: Payer: Self-pay

## 2021-12-29 ENCOUNTER — Ambulatory Visit: Payer: Managed Care, Other (non HMO) | Admitting: Gastroenterology

## 2021-12-29 ENCOUNTER — Encounter: Payer: Self-pay | Admitting: Gastroenterology

## 2021-12-29 ENCOUNTER — Telehealth: Payer: Self-pay | Admitting: Family Medicine

## 2021-12-29 VITALS — BP 133/80 | HR 103 | Temp 98.3°F | Ht 61.5 in | Wt 175.0 lb

## 2021-12-29 DIAGNOSIS — R1011 Right upper quadrant pain: Secondary | ICD-10-CM | POA: Diagnosis not present

## 2021-12-29 DIAGNOSIS — K824 Cholesterolosis of gallbladder: Secondary | ICD-10-CM

## 2021-12-29 DIAGNOSIS — F902 Attention-deficit hyperactivity disorder, combined type: Secondary | ICD-10-CM

## 2021-12-29 MED ORDER — DICYCLOMINE HCL 10 MG PO CAPS: 10.0000 mg | ORAL_CAPSULE | Freq: Three times a day (TID) | ORAL | 0 refills | Status: DC | PRN

## 2021-12-29 NOTE — Patient Instructions (Addendum)
Please go to the [facility] and pick up your oral contrast for your CT Scan. You could also get it when you go for your ultrasound.  Hemorrhoids Hemorrhoids are swollen veins that may develop: In the butt (rectum). These are called internal hemorrhoids. Around the opening of the butt (anus). These are called external hemorrhoids. Hemorrhoids can cause pain, itching, or bleeding. Most of the time, they do not cause serious problems. They usually get better with diet changes, lifestyle changes, and other home treatments. What are the causes? This condition may be caused by: Having trouble pooping (constipation). Pushing hard (straining) to poop. Watery poop (diarrhea). Pregnancy. Being very overweight (obese). Sitting for long periods of time. Heavy lifting or other activity that causes you to strain. Anal sex. Riding a bike for a long period of time. What are the signs or symptoms? Symptoms of this condition include: Pain. Itching or soreness in the butt. Bleeding from the butt. Leaking poop. Swelling in the area. One or more lumps around the opening of your butt. How is this diagnosed? A doctor can often diagnose this condition by looking at the affected area. The doctor may also: Do an exam that involves feeling the area with a gloved hand (digital rectal exam). Examine the area inside your butt using a small tube (anoscope). Order blood tests. This may be done if you have lost a lot of blood. Have you get a test that involves looking inside the colon using a flexible tube with a camera on the end (sigmoidoscopy or colonoscopy). How is this treated? This condition can usually be treated at home. Your doctor may tell you to change what you eat, make lifestyle changes, or try home treatments. If these do not help, procedures can be done to remove the hemorrhoids or make them smaller. These may involve: Placing rubber bands at the base of the hemorrhoids to cut off their blood  supply. Injecting medicine into the hemorrhoids to shrink them. Shining a type of light energy onto the hemorrhoids to cause them to fall off. Doing surgery to remove the hemorrhoids or cut off their blood supply. Follow these instructions at home: Eating and drinking  Eat foods that have a lot of fiber in them. These include whole grains, beans, nuts, fruits, and vegetables. Ask your doctor about taking products that have added fiber (fibersupplements). Reduce the amount of fat in your diet. You can do this by: Eating low-fat dairy products. Eating less red meat. Avoiding processed foods. Drink enough fluid to keep your pee (urine) pale yellow. Managing pain and swelling  Take a warm-water bath (sitz bath) for 20 minutes to ease pain. Do this 3-4 times a day. You may do this in a bathtub or using a portable sitz bath that fits over the toilet. If told, put ice on the painful area. It may be helpful to use ice between your warm baths. Put ice in a plastic bag. Place a towel between your skin and the bag. Leave the ice on for 20 minutes, 2-3 times a day. General instructions Take over-the-counter and prescription medicines only as told by your doctor. Medicated creams and medicines may be used as told. Exercise often. Ask your doctor how much and what kind of exercise is best for you. Go to the bathroom when you have the urge to poop. Do not wait. Avoid pushing too hard when you poop. Keep your butt dry and clean. Use wet toilet paper or moist towelettes after pooping. Do not  sit on the toilet for a long time. Keep all follow-up visits as told by your doctor. This is important. Contact a doctor if you: Have pain and swelling that do not get better with treatment or medicine. Have trouble pooping. Cannot poop. Have pain or swelling outside the area of the hemorrhoids. Get help right away if you have: Bleeding that will not stop. Summary Hemorrhoids are swollen veins in the butt or  around the opening of the butt. They can cause pain, itching, or bleeding. Eat foods that have a lot of fiber in them. These include whole grains, beans, nuts, fruits, and vegetables. Take a warm-water bath (sitz bath) for 20 minutes to ease pain. Do this 3-4 times a day. This information is not intended to replace advice given to you by your health care provider. Make sure you discuss any questions you have with your health care provider. Document Revised: 05/20/2021 Document Reviewed: 05/20/2021 Elsevier Patient Education  [place].

## 2021-12-29 NOTE — Progress Notes (Signed)
Jonathon Bellows MD, MRCP(U.K) [address]  [place]  [place], [place] [address]  Main: [phone]  Fax: [phone]   Gastroenterology Consultation  Referring Provider:     Cammie Sickle, * Primary Care Physician:  Steele Sizer, MD Primary Gastroenterologist:  Dr. Jonathon Bellows  Reason for Consultation:     Right upper quadrant pain, nausea vomiting        HPI:   Shelby Barry is a 56 y.o. y/o female referred for right upper quadrant pain  11/20/2021: LFT's normal. Hb 13.4 grams.   Right upper quadrant ultrasound in 08/11/2021 shows 2 small liver lesions most consistent with hemangioma.  Gallbladder polyps measuring 6 mm follow-up ultrasound in 6 months recommended   She sees Dr Rogue Bussing for breast cancer . S/p chemo .  05/20/2016 : Normal repeat in 10 years.   She states that she has had right lower quadrant pain for many years.  Describes it as a sharp pain usually precipitated by food intake within 10 minutes and lasting for an hour to.  Nonradiating.  No other clear aggravating factors.  No clear relieving factors.  Occasionally having a bowel movement helps.  Denies any constipation.  Denies any regular NSAID use.  She did have issues with nausea and vomiting during her chemotherapy.  Presently not really an issue.  She takes omeprazole once a day before breakfast in the morning.  She has some issues with hemorrhoids.  States finds it hard to wipe after bowel movement.  Denies any bleeding.  Denies any perianal discomfort. 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, estrogen receptor positive ([place])    Cataract, bilateral    Chemotherapy induced nausea and vomiting    Cold    states she has had it for 2 months   Depression    GERD (gastroesophageal reflux disease)    uses  baking soda for symptoms   Insomnia    Lumbago    Personal history of chemotherapy 11/22/2016   Personal history of radiation therapy 01/20/2017   Vitamin D deficiency     Past Surgical History:  Procedure Laterality Date   ABDOMINAL HYSTERECTOMY  04/20/2014   BILATERAL SALPINGECTOMY     BREAST BIOPSY Right 09/27/2016   positive   BREAST LUMPECTOMY Right 10/12/2016   positive   BREAST LUMPECTOMY WITH SENTINEL LYMPH NODE BIOPSY Right 10/12/2016   Procedure: BREAST LUMPECTOMY WITH SENTINEL LYMPH NODE BX;  Surgeon: Christene Lye, MD;  Location: [facility];  Service: General;  Laterality: Right;   COLONOSCOPY WITH PROPOFOL N/A 05/20/2016   Procedure: COLONOSCOPY WITH PROPOFOL;  Surgeon: Lucilla Lame, MD;  Location: [facility];  Service: Endoscopy;  Laterality: N/A;   ECTOPIC PREGNANCY SURGERY     ENDOMETRIAL ABLATION     PORTACATH PLACEMENT Left 11/04/2016   Procedure: INSERTION PORT-A-CATH;  Surgeon: Christene Lye, MD;  Location: [facility];  Service: General;  Laterality: Left;  Left subclavian vein   TUBAL LIGATION      Prior to Admission medications   Medication Sig Start Date End Date Taking? Authorizing Provider  acetaminophen (TYLENOL) 325 MG tablet Take 325 mg by mouth every 6 (six) hours as needed for fever.    [provider]  albuterol (  VENTOLIN HFA) 108 (90 Base) MCG/ACT inhaler Inhale 1-2 puffs into the lungs every 6 (six) hours as needed for wheezing or shortness of breath. 10/17/21   Danton Clap, PA-C  amphetamine-dextroamphetamine (ADDERALL XR) 20 MG 24 hr capsule Take 1 capsule (20 mg total) by mouth daily. 11/20/21   Steele Sizer, MD  amphetamine-dextroamphetamine (ADDERALL XR) 20 MG 24 hr capsule Take 1 capsule (20 mg total) by mouth every morning. 11/20/21   Steele Sizer, MD  loratadine (CLARITIN) 10 MG tablet Take 1 tablet (10 mg total) by mouth daily. 02/27/20   Steele Sizer, MD  meloxicam (MOBIC) 15 MG tablet TAKE 1 TABLET BY  MOUTH  DAILY 08/02/18   Steele Sizer, MD  omeprazole (PRILOSEC) 20 MG capsule Take 1 capsule (20 mg total) by mouth daily. 06/16/21   Steele Sizer, MD  sertraline (ZOLOFT) 50 MG tablet Take 1 tablet (50 mg total) by mouth daily. 11/20/21   Steele Sizer, MD  zolpidem (AMBIEN) 5 MG tablet Take 1 tablet (5 mg total) by mouth at bedtime as needed for sleep. 11/20/21   Steele Sizer, MD    Family History  Problem Relation Age of Onset   Heart disease Mother    Depression Father    Obesity Father    Diabetes Father    Alcohol abuse Father    COPD Father    Alcohol abuse Brother    Seizures Brother    Breast cancer Sister 53     Social History   Tobacco Use   Smoking status: Former    Packs/day: 0.50    Years: 20.00    Pack years: 10.00    Types: Cigarettes    Quit date: 08/23/2016    Years since quitting: 5.3   Smokeless tobacco: Never  Vaping Use   Vaping Use: Never used  Substance Use Topics   Alcohol use: Yes    Alcohol/week: 0.0 standard drinks    Comment: occassional   Drug use: No    Allergies as of 12/29/2021 - Review Complete 11/20/2021  Allergen Reaction Noted   Adhesive [tape] Other (See Comments) 10/01/2016   Nickel  10/06/2016    Review of Systems:    All systems reviewed and negative except where noted in HPI.   Physical Exam:  There were no vitals taken for this visit. No LMP recorded. Patient has had a hysterectomy. Psych:  Alert and cooperative. Normal mood and affect. General:   Alert,  Well-developed, well-nourished, pleasant and cooperative in NAD Head:  Normocephalic and atraumatic. Eyes:  Sclera clear, no icterus.   Conjunctiva pink. Abdomen:  Normal bowel sounds.  No bruits.  Soft, non-tender and non-distended without masses, hepatosplenomegaly or hernias noted.  No guarding or rebound tenderness.    Neurologic:  Alert and oriented x3;  grossly normal neurologically. Psych:  Alert and cooperative. Normal mood and affect.  Imaging  Studies: No results found.  Assessment and Plan:   Shelby Barry is a 56 y.o. y/o female has been referred for RUQ pain.  Per history and examination pain is in the right lower quadrant.  The nature of the pain based on history has some features of biliary colic but the location is not in the region of the gallbladder.  H/o breast cancer. Found to have gall bladder polyps on last USG RUQ   Plan  Repeat USG RUQ to evaluate any change in size of gall bladder polyps.  At next visit if still has symptoms suggestive of biliary colic then  will consider referral to general surgery to discuss about cholecystectomy CT abdomen and pelvis to rule out any pathology in the right lower quadrant Nausea and vomiting are not an issue at this point of time and longstanding and patient does not wish to have any other further evaluation at this point of time. Bentyl 10 mg 3 times daily as needed for right lower quadrant pain  Follow up in 2 months  Dr Jonathon Bellows MD,MRCP(U.K)

## 2021-12-29 NOTE — Telephone Encounter (Signed)
Medication Refill - Medication: amphetamine-dextroamphetamine (ADDERALL XR) 20 MG 24 hr capsule  Has the patient contacted their pharmacy? No. (Agent: If no, request that the patient contact the pharmacy for the refill. If patient does not wish to contact the pharmacy document the reason why and proceed with request.) (Agent: If yes, when and what did the pharmacy advise?)  Preferred Pharmacy (with phone number or street name):  [facility] [place] [address]  [place] ([place]) [place] [address]  Phone: [phone] Fax: [phone]   Has the patient been seen for an appointment in the last year OR does the patient have an upcoming appointment? Yes.    Agent: Please be advised that RX refills may take up to 3 business days. We ask that you follow-up with your pharmacy.

## 2021-12-29 NOTE — Telephone Encounter (Signed)
[place] called and spoke to [facility], [employer] about the refill(s) Adderall requested. Advised it was sent to [facility] on [address] on 11/20/21 #30/0 refill(s), asked are they able to retrieve. She says no because it's controlled. She looked and says both are on hold at the [facility] location. Patient called and advised the Rx was sent to the [facility] location. She says she will go there to pick it up, she wasn't aware it was sent there.

## 2021-12-30 NOTE — Telephone Encounter (Signed)
error 

## 2022-01-04 ENCOUNTER — Ambulatory Visit: Payer: Managed Care, Other (non HMO)

## 2022-01-05 ENCOUNTER — Other Ambulatory Visit: Payer: Self-pay

## 2022-01-05 ENCOUNTER — Ambulatory Visit
Admission: RE | Admit: 2022-01-05 | Discharge: 2022-01-05 | Disposition: A | Discharge status: Home / Self Care | Payer: Managed Care, Other (non HMO) | Source: Ambulatory Visit | Attending: Gastroenterology | Admitting: Gastroenterology

## 2022-01-05 DIAGNOSIS — K824 Cholesterolosis of gallbladder: Secondary | ICD-10-CM | POA: Diagnosis present

## 2022-01-07 ENCOUNTER — Ambulatory Visit
Admission: RE | Admit: 2022-01-07 | Discharge: 2022-01-07 | Disposition: A | Discharge status: Home / Self Care | Payer: Managed Care, Other (non HMO) | Source: Ambulatory Visit | Attending: Gastroenterology | Admitting: Gastroenterology

## 2022-01-07 ENCOUNTER — Other Ambulatory Visit: Payer: Self-pay

## 2022-01-07 DIAGNOSIS — R1011 Right upper quadrant pain: Secondary | ICD-10-CM | POA: Diagnosis not present

## 2022-01-07 MED ORDER — IOHEXOL 300 MG/ML  SOLN
100.0000 mL | Freq: Once | INTRAMUSCULAR | Status: AC | PRN
  Administered 2022-01-07: 100 mL via INTRAVENOUS

## 2022-01-07 NOTE — Progress Notes (Signed)
Gall bladder polyp size is stable

## 2022-01-08 ENCOUNTER — Telehealth: Payer: Self-pay

## 2022-01-08 NOTE — Telephone Encounter (Signed)
Called patient to give her results and she understood and had no further questions.

## 2022-01-08 NOTE — Telephone Encounter (Signed)
-----   Message from Jonathon Bellows, MD sent at 01/07/2022  1:37 PM EST ----- Gall bladder polyp size is stable

## 2022-01-10 NOTE — Progress Notes (Signed)
Inform CT scan of the abdomen shows no abnormality in the right upper quadrant which was concerning for her pain

## 2022-01-12 ENCOUNTER — Telehealth: Payer: Self-pay | Admitting: Family Medicine

## 2022-01-12 DIAGNOSIS — K219 Gastro-esophageal reflux disease without esophagitis: Secondary | ICD-10-CM

## 2022-01-14 NOTE — Telephone Encounter (Signed)
Lvm for pt to call back and schedule an appt 

## 2022-01-26 ENCOUNTER — Inpatient Hospital Stay: Payer: Managed Care, Other (non HMO) | Admitting: Internal Medicine

## 2022-01-26 ENCOUNTER — Inpatient Hospital Stay: Payer: Managed Care, Other (non HMO)

## 2022-01-27 ENCOUNTER — Telehealth: Payer: Self-pay | Admitting: Gastroenterology

## 2022-01-27 NOTE — Telephone Encounter (Signed)
Patient states she called yesterday to go over her results and did not get a call back. Patient requests a call back from nurse to go over CT results.  ?

## 2022-01-28 NOTE — Telephone Encounter (Signed)
Patient was contacted to let her know that we had spoken on 01/08/2022 and went over the results. However, I told her that I would be glad to go over it again. Once I went over the results the patient understood and had no further questions. ?

## 2022-02-17 ENCOUNTER — Ambulatory Visit: Payer: Self-pay | Admitting: *Deleted

## 2022-02-17 NOTE — Telephone Encounter (Signed)
Reason for Disposition ? Bruising easily is a chronic symptom (recurrent or ongoing AND present > 4 weeks) ? ?Answer Assessment - Initial Assessment Questions ?1. APPEARANCE of BRUISE: "Describe the bruise."  ?    Bruising is gone- knot is present ?2. SIZE: "How large is the bruise?"  ?    Feels mandarin orange size ?3. NUMBER: "How many bruises are there?"  ?    1 ?4. LOCATION: "Where is the bruise located?"  ?    R inside buttock ?5. ONSET: "How long ago did the bruise occur?"  ?    5 weeks ago ?6. CAUSE: "Tell me how it happened." ?    Patient fell- tried to climb through window(no keys)- and fell ?7. MEDICAL HISTORY: "Do you have any medical problems that can cause easy bruising or bleeding?" (e.g., leukemia, liver disease, recent chemotherapy) ?    no ?8. MEDICATIONS: "Do you take any medications which thin the blood such as: aspirin, heparin, ibuprofen (NSAIDS), Plavix, or Coumadin?" ?    no ?9. OTHER SYMPTOMS: "Do you have any other symptoms?"  (e.g., weakness, dizziness, pain, fever, nosebleed, blood in urine/stool) ?    Pain with sitting ?10. PREGNANCY: "Is there any chance you are pregnant?" "When was your last menstrual period?" ?      *No Answer* ? ?Protocols used: Bruises-A-AH ? ?

## 2022-02-17 NOTE — Telephone Encounter (Signed)
?  Chief Complaint: painful knot on buttock ?Symptoms: large bruising from fall- now had knot where bruising was ?Frequency: 5 weeks ?Pertinent Negatives: Patient denies weakness, dizziness, fever, nosebleed, blood in urine/stool ?Disposition: '[]'$ ED /'[]'$ Urgent Care (no appt availability in office) / '[x]'$ Appointment(In office/virtual)/ '[]'$  [facility]/ '[]'$ Home Care/ '[]'$ Refused Recommended Disposition /'[]'$ [facility]/ '[]'$  Follow-up with PCP ?Additional Notes:    ?

## 2022-02-18 NOTE — Progress Notes (Deleted)
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, estrogen receptor positive (HCC) 10/26/2016   Recurrent major depression-severe (HCC) 08/22/2015   Bursitis of shoulder 08/22/2015   Bilateral cataracts 08/22/2015   Circadian rhythm sleep disorder, 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   Vitamin D deficiency 08/22/2015   LBP (low back pain) 08/22/2015   GAD (generalized anxiety disorder) 08/22/2015   History of anemia 08/17/2007   Insomnia 08/16/2007    Past Surgical History:  Procedure Laterality Date   ABDOMINAL HYSTERECTOMY  04/20/2014   BILATERAL SALPINGECTOMY     BREAST BIOPSY Right 09/27/2016   positive   BREAST LUMPECTOMY Right 10/12/2016   positive   BREAST LUMPECTOMY WITH SENTINEL LYMPH NODE BIOPSY Right 10/12/2016   Procedure: BREAST LUMPECTOMY WITH SENTINEL LYMPH NODE BX;  Surgeon: Kieth Brightly, MD;  Location: [facility];  Service: General;  Laterality: Right;   COLONOSCOPY WITH PROPOFOL N/A 05/20/2016   Procedure: COLONOSCOPY WITH PROPOFOL;  Surgeon: Midge Minium, MD;  Location: [facility];  Service: Endoscopy;  Laterality: N/A;   ECTOPIC PREGNANCY SURGERY     ENDOMETRIAL ABLATION     PORTACATH PLACEMENT Left 11/04/2016   Procedure: INSERTION PORT-A-CATH;  Surgeon: Kieth Brightly, MD;  Location: [facility];  Service: General;  Laterality: Left;  Left subclavian vein   TUBAL LIGATION      Family History  Problem Relation Age of Onset   Heart disease Mother    Depression Father    Obesity Father    Diabetes Father    Alcohol abuse Father    COPD Father    Alcohol abuse Brother    Seizures Brother    Breast cancer Sister 20     Social History   Tobacco Use   Smoking status: Former    Packs/day: 0.50    Years: 20.00    Pack years: 10.00    Types: Cigarettes    Quit date: 08/23/2016    Years since quitting: 5.4   Smokeless tobacco: Never  Substance Use Topics   Alcohol use: Yes    Alcohol/week: 0.0 standard drinks    Comment: occassional     Current Outpatient Medications:    acetaminophen (TYLENOL) 325 MG tablet, Take 325 mg by mouth every 6 (six) hours as needed for fever., Disp: , Rfl:    albuterol (VENTOLIN HFA) 108 (90 Base) MCG/ACT inhaler, Inhale 1-2 puffs into the lungs every 6 (six) hours as needed for wheezing or shortness of breath., Disp: 1 g, Rfl: 0   amphetamine-dextroamphetamine (ADDERALL XR) 20 MG 24 hr capsule, Take 1 capsule (20 mg total) by mouth daily., Disp: 30 capsule, Rfl: 0   dicyclomine (BENTYL) 10 MG capsule, Take 1 capsule (10 mg total) by mouth 3 (three) times daily as needed for spasms., Disp: 30 capsule, Rfl: 0   loratadine (CLARITIN) 10 MG tablet, Take 1 tablet (10 mg total) by mouth daily. (Patient not taking: Reported on 12/29/2021), Disp: 30 tablet, Rfl: 2   meloxicam (MOBIC) 15 MG tablet, TAKE 1 TABLET BY MOUTH  DAILY (Patient not taking: Reported on 12/29/2021), Disp: 90 tablet, Rfl: 0  omeprazole (PRILOSEC) 20 MG capsule, TAKE 1 CAPSULE BY MOUTH  DAILY, Disp: 90 capsule, Rfl: 0   sertraline (ZOLOFT) 50 MG tablet, Take 1 tablet (50 mg total) by mouth daily., Disp: 90 tablet, Rfl: 0   zolpidem (AMBIEN) 5 MG tablet, Take 1 tablet (5 mg total) by mouth at bedtime as needed for sleep., Disp: 90 tablet, Rfl: 0  Allergies  Allergen Reactions   Adhesive [Tape] Other (See Comments)    Blisters/rash  PLEASE USE PAPER TAPE ONLY   Nickel     PT CAN WEAR GOLD, STERLING SILVER WITH NO PROBLEM-PT HAS NEVER HAD ANY PROBLEMS IN THE OR    I personally reviewed active problem list, medication list, allergies, family history, social history, health maintenance with the  patient/caregiver today.   ROS  ***  Objective  There were no vitals filed for this visit.  There is no height or weight on file to calculate BMI.  Physical Exam ***  Recent Results (from the past 2160 hour(s))  Lipid panel     Status: Abnormal   Collection Time: 11/20/21  1:46 PM  Result Value Ref Range   Cholesterol 199 <200 mg/dL   HDL 72 > OR = 50 mg/dL   Triglycerides 213 <086 mg/dL   LDL Cholesterol (Calc) 104 (H) mg/dL (calc)    Comment: Reference range: <100 . Desirable range <100 mg/dL for primary prevention;   <70 mg/dL for patients with CHD or diabetic patients  with > or = 2 CHD risk factors. Marland Kitchen LDL-C is now calculated using the Martin-Hopkins  calculation, which is a validated novel method providing  better accuracy than the Friedewald equation in the  estimation of LDL-C.  Horald Pollen et al. Lenox Ahr. 5784;696(29): 2061-2068  (http://education.[facility].com/faq/FAQ164)    Total CHOL/HDL Ratio 2.8 <5.0 (calc)   Non-HDL Cholesterol (Calc) 127 <130 mg/dL (calc)    Comment: For patients with diabetes plus 1 major ASCVD risk  factor, treating to a non-HDL-C goal of <100 mg/dL  (LDL-C of <52 mg/dL) is considered a therapeutic  option.   CBC with Differential/Platelet     Status: Abnormal   Collection Time: 11/20/21  1:46 PM  Result Value Ref Range   WBC 7.0 3.8 - 10.8 Thousand/uL   RBC 5.02 3.80 - 5.10 Million/uL   Hemoglobin 13.4 11.7 - 15.5 g/dL   HCT 84.1 32.4 - 40.1 %   MCV 82.7 80.0 - 100.0 fL   MCH 26.7 (L) 27.0 - 33.0 pg   MCHC 32.3 32.0 - 36.0 g/dL   RDW 02.7 25.3 - 66.4 %   Platelets 379 140 - 400 Thousand/uL   MPV 9.1 7.5 - 12.5 fL   Neutro Abs 3,878 1,500 - 7,800 cells/uL   Lymphs Abs 2,436 850 - 3,900 cells/uL   Absolute Monocytes 476 200 - 950 cells/uL   Eosinophils Absolute 168 15 - 500 cells/uL   Basophils Absolute 42 0 - 200 cells/uL   Neutrophils Relative % 55.4 %   Total Lymphocyte 34.8 %   Monocytes Relative 6.8 %    Eosinophils Relative 2.4 %   Basophils Relative 0.6 %  COMPLETE METABOLIC PANEL WITH GFR     Status: None   Collection Time: 11/20/21  1:46 PM  Result Value Ref Range   Glucose, Bld 88 65 - 99 mg/dL    Comment: .            Fasting reference interval .    BUN 10 7 - 25 mg/dL   Creat  0.74 0.50 - 1.03 mg/dL   eGFR 95 > OR = 60 VO/ZDG/6.44I3    Comment: The eGFR is based on the CKD-EPI 2021 equation. To calculate  the new eGFR from a previous Creatinine or Cystatin C result, go to https://www.kidney.org/professionals/ kdoqi/gfr%5Fcalculator    BUN/Creatinine Ratio NOT APPLICABLE 6 - 22 (calc)   Sodium 141 135 - 146 mmol/L   Potassium 4.4 3.5 - 5.3 mmol/L   Chloride 105 98 - 110 mmol/L   CO2 28 20 - 32 mmol/L   Calcium 9.2 8.6 - 10.4 mg/dL   Total Protein 6.2 6.1 - 8.1 g/dL   Albumin 4.2 3.6 - 5.1 g/dL   Globulin 2.0 1.9 - 3.7 g/dL (calc)   AG Ratio 2.1 1.0 - 2.5 (calc)   Total Bilirubin 0.4 0.2 - 1.2 mg/dL   Alkaline phosphatase (APISO) 97 37 - 153 U/L   AST 19 10 - 35 U/L   ALT 20 6 - 29 U/L  TSH     Status: None   Collection Time: 11/20/21  1:46 PM  Result Value Ref Range   TSH 1.46 mIU/L    Comment:           Reference Range .           > or = 20 Years  0.40-4.50 .                Pregnancy Ranges           First trimester    0.26-2.66           Second trimester   0.55-2.73           Third trimester    0.43-2.91   VITAMIN D 25 Hydroxy (Vit-D Deficiency, Fractures)     Status: Abnormal   Collection Time: 11/20/21  1:46 PM  Result Value Ref Range   Vit D, 25-Hydroxy 26 (L) 30 - 100 ng/mL    Comment: Vitamin D Status         25-OH Vitamin D: . Deficiency:                    <20 ng/mL Insufficiency:             20 - 29 ng/mL Optimal:                 > or = 30 ng/mL . For 25-OH Vitamin D testing on patients on  D2-supplementation and patients for whom quantitation  of D2 and D3 fractions is required, the QuestAssureD(TM) 25-OH VIT D, (D2,D3), LC/MS/MS is  recommended: order  code 47425 (patients >52yrs). See Note 1 . Note 1 . For additional information, please refer to  http://education.[facility].com/faq/FAQ199  (This link is being provided for informational/ educational purposes only.)   Vitamin B12     Status: None   Collection Time: 11/20/21  1:46 PM  Result Value Ref Range   Vitamin B-12 454 200 - 1,100 pg/mL  Hemoglobin A1c     Status: None   Collection Time: 11/20/21  1:46 PM  Result Value Ref Range   Hgb A1c MFr Bld 5.3 <5.7 % of total Hgb    Comment: For the purpose of screening for the presence of diabetes: . <5.7%       Consistent with the absence of diabetes 5.7-6.4%    Consistent with increased risk for diabetes             (prediabetes) > or =6.5%  Consistent with diabetes .  This assay result is consistent with a decreased risk of diabetes. . Currently, no consensus exists regarding use of hemoglobin A1c for diagnosis of diabetes in children. . According to American Diabetes Association (ADA) guidelines, hemoglobin A1c <7.0% represents optimal control in non-pregnant diabetic patients. Different metrics may apply to specific patient populations.  Standards of Medical Care in Diabetes(ADA). .    Mean Plasma Glucose 105 mg/dL   eAG (mmol/L) 5.8 mmol/L     PHQ2/9:    11/20/2021    1:03 PM 06/16/2021    1:23 PM 03/17/2021    1:39 PM 12/16/2020    8:46 AM 07/14/2020    1:32 PM  Depression screen PHQ 2/9  Decreased Interest 0 0 1 0 1  Down, Depressed, Hopeless 0 0 0 0 1  PHQ - 2 Score 0 0 1 0 2  Altered sleeping 3 3 3  1   Tired, decreased energy 0 3 1  1   Change in appetite 0 0 0  0  Feeling bad or failure about yourself  0 0 0  0  Trouble concentrating 0 0 0  0  Moving slowly or fidgety/restless 0 0 0  0  Suicidal thoughts 0 0 0  0  PHQ-9 Score 3 6 5  4   Difficult doing work/chores     Not difficult at all    phq 9 is {gen pos [id number]}   Fall Risk:    11/20/2021    1:03 PM 06/16/2021     1:23 PM 03/17/2021    1:38 PM 12/16/2020    8:46 AM 07/14/2020    1:31 PM  Fall Risk   Falls in the past year? 0 0 0 0 0  Number falls in past yr: 0 0 0 0 0  Injury with Fall? 0 0 0 0 0  Risk for fall due to : No Fall Risks      Follow up Falls prevention discussed          Functional Status Survey:      Assessment & Plan  *** There are no diagnoses linked to this encounter.

## 2022-02-19 ENCOUNTER — Ambulatory Visit: Payer: Managed Care, Other (non HMO) | Admitting: Family Medicine

## 2022-02-24 ENCOUNTER — Encounter: Payer: Self-pay | Admitting: Internal Medicine

## 2022-02-24 ENCOUNTER — Ambulatory Visit: Payer: Managed Care, Other (non HMO) | Admitting: Internal Medicine

## 2022-02-24 VITALS — BP 122/80 | HR 102 | Temp 98.1°F | Resp 16 | Ht 61.0 in | Wt 173.7 lb

## 2022-02-24 DIAGNOSIS — L729 Follicular cyst of the skin and subcutaneous tissue, unspecified: Secondary | ICD-10-CM | POA: Diagnosis not present

## 2022-02-24 DIAGNOSIS — F5104 Psychophysiologic insomnia: Secondary | ICD-10-CM | POA: Diagnosis not present

## 2022-02-24 DIAGNOSIS — W19XXXA Unspecified fall, initial encounter: Secondary | ICD-10-CM | POA: Diagnosis not present

## 2022-02-24 DIAGNOSIS — F902 Attention-deficit hyperactivity disorder, combined type: Secondary | ICD-10-CM | POA: Diagnosis not present

## 2022-02-24 MED ORDER — AMPHETAMINE-DEXTROAMPHET ER 20 MG PO CP24: 20.0000 mg | ORAL_CAPSULE | Freq: Every day | ORAL | 0 refills | Status: DC

## 2022-02-24 MED ORDER — MELOXICAM 15 MG PO TABS: 15.0000 mg | ORAL_TABLET | Freq: Every day | ORAL | 0 refills | Status: DC

## 2022-02-24 MED ORDER — ZOLPIDEM TARTRATE 5 MG PO TABS: 5.0000 mg | ORAL_TABLET | Freq: Every evening | ORAL | 0 refills | Status: DC | PRN

## 2022-02-24 NOTE — Progress Notes (Signed)
? ?Acute Office Visit ? ?Subjective:  ? ? Patient ID: Shelby Barry, female    DOB: 10/10/1966, 56 y.o.   MRN: [id number] ? ?Chief Complaint  ?Patient presents with  ? Cyst  ?  On right butt from fall a month again, sometimes sore  ? ? ?HPI ?Patient is in today for lump on buttock after fall.  Was staying with son in [place], got locked out of house and was trying to climb in the window and fell on her bottom. Had a large dark blue bruise across left buttock. Bruise faded but now a large knot on left buttock. Knot is hard and freely mobile. Tender to touch or with prolonged sitting. No pain or any other skin changes anywhere else. No fevers, drainage or overlying skin changes.  ? ?ADD: takes Adderall 20 XR 5 times a week. Has more inattentive symptoms and has a hard time focusing and multitasking. Was formally diagnosed by [facility] ? ?Insomnia: Has been stable on Ambien 5 mg at night for sleep. Due for refills. ? ?Past Medical History:  ?Diagnosis Date  ? Anemia   ? Anxiety   ? Breast cancer ([place]) 09/2016  ? right breast  ? Bursitis of right shoulder   ? Cancer [facility])   ? breast  ? 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 Cataract, bilateral   ? Chemotherapy induced nausea and vomiting   ? Cold   ? states she has had it for 2 months  ? Depression   ? GERD (gastroesophageal reflux disease)   ? uses baking soda for symptoms  ? Insomnia   ? Lumbago   ? Personal history of chemotherapy 11/22/2016  ? Personal history of radiation therapy 01/20/2017  ? Vitamin D deficiency   ? ? ?Past Surgical History:  ?Procedure Laterality Date  ? ABDOMINAL HYSTERECTOMY  04/20/2014  ? BILATERAL SALPINGECTOMY    ? BREAST BIOPSY Right 09/27/2016  ? positive  ? BREAST LUMPECTOMY Right 10/12/2016  ? positive  ? BREAST LUMPECTOMY WITH SENTINEL LYMPH NODE BIOPSY Right 10/12/2016  ? Procedure: BREAST LUMPECTOMY WITH SENTINEL LYMPH NODE BX;  Surgeon: Christene Lye,  MD;  Location: [facility];  Service: General;  Laterality: Right;  ? COLONOSCOPY WITH PROPOFOL N/A 05/20/2016  ? Procedure: COLONOSCOPY WITH PROPOFOL;  Surgeon: Lucilla Lame, MD;  Location: [place];  Service: Endoscopy;  Laterality: N/A;  ? ECTOPIC PREGNANCY SURGERY    ? ENDOMETRIAL ABLATION    ? PORTACATH PLACEMENT Left 11/04/2016  ? Procedure: INSERTION PORT-A-CATH;  Surgeon: Christene Lye, MD;  Location: [facility];  Service: General;  Laterality: Left;  Left subclavian vein  ? TUBAL LIGATION    ? ? ?Family History  ?Problem Relation Age of Onset  ? Heart disease Mother   ? Depression Father   ? Obesity Father   ? Diabetes Father   ? Alcohol abuse Father   ? COPD Father   ? Alcohol abuse Brother   ? Seizures Brother   ? Breast cancer Sister 60  ? ? ?Social History  ? ?Socioeconomic History  ? Marital status: Significant Other  ?  Spouse name: [name]  ? Number of children: 2  ? Years of education: Not on file  ? Highest education level: Associate degree: academic program  ?Occupational History  ? Occupation: specimen process specialist  ?  Employer: [employer]  ?Tobacco Use  ? Smoking status: Former  ?  Packs/day:  0.50  ?  Years: 20.00  ?  Pack years: 10.00  ?  Types: Cigarettes  ?  Quit date: 08/23/2016  ?  Years since quitting: 5.5  ? Smokeless tobacco: Never  ?Vaping Use  ? Vaping Use: Never used  ?Substance and Sexual Activity  ? Alcohol use: Yes  ?  Alcohol/week: 0.0 standard drinks  ?  Comment: occassional  ? Drug use: No  ? Sexual activity: Yes  ?  Partners: Male  ?Other Topics Concern  ? Not on file  ?Social History Narrative  ? Broke up with boyfriend John Summer 2021 and is happy about it   ? Works at  [employer]  ? ?Social Determinants of Health  ? ?Financial Resource Strain: Not on file  ?Food Insecurity: Not on file  ?Transportation Needs: Not on file  ?Physical Activity: Not on file  ?Stress: Not on file  ?Social Connections: Not on file  ?Intimate Partner Violence: Not on file  ? ? ?Outpatient  Medications Prior to Visit  ?Medication Sig Dispense Refill  ? acetaminophen (TYLENOL) 325 MG tablet Take 325 mg by mouth every 6 (six) hours as needed for fever.    ? albuterol (VENTOLIN HFA) 108 (90 Base) MCG/ACT inhaler Inhale 1-2 puffs into the lungs every 6 (six) hours as needed for wheezing or shortness of breath. 1 g 0  ? amphetamine-dextroamphetamine (ADDERALL XR) 20 MG 24 hr capsule Take 1 capsule (20 mg total) by mouth daily. 30 capsule 0  ? dicyclomine (BENTYL) 10 MG capsule Take 1 capsule (10 mg total) by mouth 3 (three) times daily as needed for spasms. 30 capsule 0  ? loratadine (CLARITIN) 10 MG tablet Take 1 tablet (10 mg total) by mouth daily. (Patient not taking: Reported on 12/29/2021) 30 tablet 2  ? meloxicam (MOBIC) 15 MG tablet TAKE 1 TABLET BY MOUTH  DAILY (Patient not taking: Reported on 12/29/2021) 90 tablet 0  ? omeprazole (PRILOSEC) 20 MG capsule TAKE 1 CAPSULE BY MOUTH  DAILY 90 capsule 0  ? sertraline (ZOLOFT) 50 MG tablet Take 1 tablet (50 mg total) by mouth daily. 90 tablet 0  ? zolpidem (AMBIEN) 5 MG tablet Take 1 tablet (5 mg total) by mouth at bedtime as needed for sleep. 90 tablet 0  ? ?No facility-administered medications prior to visit.  ? ? ?Allergies  ?Allergen Reactions  ? Adhesive [Tape] Other (See Comments)  ?  Blisters/rash ? ?PLEASE USE PAPER TAPE ONLY  ? Nickel   ?  PT CAN WEAR GOLD, STERLING SILVER WITH NO PROBLEM-PT HAS NEVER HAD ANY PROBLEMS IN THE OR  ? ? ?Review of Systems  ?Constitutional:  Negative for chills, fever and unexpected weight change.  ?Respiratory:  Negative for cough and shortness of breath.   ?Cardiovascular:  Negative for chest pain.  ?Psychiatric/Behavioral:  Positive for sleep disturbance.   ? ?   ?Objective:  ?  ?Physical Exam ?Constitutional:   ?   Appearance: Normal appearance.  ?HENT:  ?   Head: Normocephalic and atraumatic.  ?Eyes:  ?   Conjunctiva/sclera: Conjunctivae normal.  ?Cardiovascular:  ?   Rate and Rhythm: Normal rate and regular rhythm.   ?Pulmonary:  ?   Effort: Pulmonary effort is normal.  ?   Breath sounds: Normal breath sounds.  ?Musculoskeletal:  ?   Right lower leg: No edema.  ?   Left lower leg: No edema.  ?   Comments: 1x1 cm soft freely mobile cyst like lesion on left buttock   ?Skin: ?  General: Skin is warm and dry.  ?Neurological:  ?   General: No focal deficit present.  ?   Mental Status: She is alert. Mental status is at baseline.  ? ? ?BP 122/80   Pulse (!) 102   Temp 98.1 ?F (36.7 ?C)   Resp 16   Ht _0  (1.549 m)   Wt 173 lb 11.2 oz (78.8 kg)   SpO2 96%   BMI 32.82 kg/m?  ?Wt Readings from Last 3 Encounters:  ?12/29/21 175 lb (79.4 kg)  ?11/20/21 176 lb (79.8 kg)  ?10/17/21 174 lb (78.9 kg)  ? ? ?Health Maintenance Due  ?Topic Date Due  ? Zoster Vaccines- Shingrix (1 of 2) Never done  ? COVID-19 Vaccine (3 - Moderna risk series) 08/19/2020  ? ? ?There are no preventive care reminders to display for this patient. ? ? ?Lab Results  ?Component Value Date  ? TSH 1.46 11/20/2021  ? ?Lab Results  ?Component Value Date  ? WBC 7.0 11/20/2021  ? HGB 13.4 11/20/2021  ? HCT 41.5 11/20/2021  ? MCV 82.7 11/20/2021  ? PLT 379 11/20/2021  ? ?Lab Results  ?Component Value Date  ? NA 141 11/20/2021  ? K 4.4 11/20/2021  ? CO2 28 11/20/2021  ? GLUCOSE 88 11/20/2021  ? BUN 10 11/20/2021  ? CREATININE 0.74 11/20/2021  ? BILITOT 0.4 11/20/2021  ? ALKPHOS 90 04/22/2021  ? AST 19 11/20/2021  ? ALT 20 11/20/2021  ? PROT 6.2 11/20/2021  ? ALBUMIN 4.2 04/22/2021  ? CALCIUM 9.2 11/20/2021  ? ANIONGAP 11 04/22/2021  ? EGFR 95 11/20/2021  ? ?Lab Results  ?Component Value Date  ? CHOL 199 11/20/2021  ? ?Lab Results  ?Component Value Date  ? HDL 72 11/20/2021  ? ?Lab Results  ?Component Value Date  ? [place] 104 (H) 11/20/2021  ? ?Lab Results  ?Component Value Date  ? TRIG 136 11/20/2021  ? ?Lab Results  ?Component Value Date  ? CHOLHDL 2.8 11/20/2021  ? ?Lab Results  ?Component Value Date  ? HGBA1C 5.3 11/20/2021  ? ? ?   ?Assessment & Plan:  ? ?1. Cyst  of buttocks/Fall, initial encounter: [place] to further assess, hematoma vs. Cyst. Treat with anti-inflammatories heat/ice etc.  ? ?- US SOFT TISSUE LOWER EXTREMITY LIMITED RIGHT (NON-VASCULAR); Future ?- meloxi

## 2022-02-24 NOTE — Patient Instructions (Addendum)
It was great seeing you today! ? ?Plan discussed at today's visit: ?-Ultrasound ordered to assess the lesion further ?-If it is a hematoma it will eventually absorb on its own, can use Tylenol/anti-inflammatories heat/ice as needed for pain  ?-Mobic refilled, do not take any other anti-inflammatories with this medication and take with food ? ?Follow up in: 3 months  ? ?Take care and let us know if you have any questions or concerns prior to your next visit. ? ?Dr. Rosana Berger ? ?Hematoma ?A hematoma is a collection of blood under the skin, in an organ, in a body space, in a joint space, or in other tissue. The blood can thicken (clot) to form a lump that you can see and feel. The lump is often firm and may become sore and tender. Most hematomas get better in a few days to weeks. However, some hematomas may be serious and require medical care. Hematomas can range from very small to very large. ?What are the causes? ?This condition is caused by: ?A blunt or penetrating injury. ?A leakage from a blood vessel under the skin. ?Some medical procedures, including surgeries, such as oral surgery, face lifts, and surgeries on the joints. ?Some medical conditions that cause bleeding or bruising. There may be multiple hematomas that appear in different areas of the body. ?What increases the risk? ?You are more likely to develop this condition if: ?You are an older adult. ?You use blood thinners. ?What are the signs or symptoms? ?Symptoms of this condition depend on where the hematoma is located.  ?Common symptoms of a hematoma that is under the skin include: ?A firm lump on the body. ?Pain and tenderness in the area. ?Bruising. Blue, dark blue, purple-red, or yellowish skin (discoloration) may appear at the site of the hematoma if the hematoma is close to the surface of the skin. ?Common symptoms of a hematoma that is deep in the tissues or body spaces may be less obvious. They include: ?A collection of blood in the stomach  (intra-abdominal hematoma). This may cause pain in the abdomen, weakness, fainting, and shortness of breath. ?A collection of blood in the head (intracranial hematoma). This may cause a headache or symptoms such as weakness, trouble speaking or understanding, or a change in consciousness.  ?How is this diagnosed? ?This condition is diagnosed based on: ?Your medical history. ?A physical exam. ?Imaging tests, such as an ultrasound or CT scan. These may be needed if your health care provider suspects a hematoma in deeper tissues or body spaces. ?Blood tests. These may be needed if your health care provider believes that the hematoma is caused by a medical condition. ?How is this treated? ?Treatment for this condition depends on the cause, size, and location of the hematoma. Treatment may include: ?Doing nothing. The majority of hematomas do not need treatment as many of them go away on their own over time. ?Surgery or close monitoring. This may be needed for large hematomas or hematomas that affect vital organs. ?Medicines. Medicines may be given if there is an underlying medical cause for the hematoma. ?Follow these instructions at home: ?Managing pain, stiffness, and swelling ? ?If directed, put ice on the affected area. ?Put ice in a plastic bag. ?Place a towel between your skin and the bag. ?Leave the ice on for 20 minutes, 2-3 times a day for the first couple of days. ?If directed, apply heat to the affected area after applying ice for a couple of days. Use the heat source that your  health care provider recommends, such as a moist heat pack or a heating pad. ?Place a towel between your skin and the heat source. ?Leave the heat on for 20-30 minutes. ?Remove the heat if your skin turns bright red. This is especially important if you are unable to feel pain, heat, or cold. You may have a greater risk of getting burned. ?Raise (elevate) the affected area above the level of your heart while you are sitting or lying  down. ?If told, wrap the affected area with an elastic bandage. The bandage applies pressure (compression) to the area, which may help to reduce swelling and promote healing. Do not wrap the bandage too tightly around the affected area. ?If your hematoma is on a leg or foot (lower extremity) and is painful, your health care provider may recommend crutches. Use them as told by your health care provider. ?General instructions ?Take over-the-counter and prescription medicines only as told by your health care provider. ?Keep all follow-up visits as told by your health care provider. This is important. ?Contact a health care provider if: ?You have a fever. ?The swelling or discoloration gets worse. ?You develop more hematomas. ?Get help right away if: ?Your pain is worse or your pain is not controlled with medicine. ?Your skin over the hematoma breaks or starts bleeding. ?Your hematoma is in your chest or abdomen and you have weakness, shortness of breath, or a change in consciousness. ?You have a hematoma on your scalp that is caused by a fall or injury, and you also have: ?A headache that gets worse. ?Trouble speaking or understanding speech. ?Weakness. ?Change in alertness or consciousness. ?Summary ?A hematoma is a collection of blood under the skin, in an organ, in a body space, in a joint space, or in other tissue. ?This condition usually does not need treatment because many hematomas go away on their own over time. ?Large hematomas, or those that may affect vital organs, may need surgical drainage or monitoring. If the hematoma is caused by a medical condition, medicines may be prescribed. ?Get help right away if your hematoma breaks or starts to bleed, you have shortness of breath, or you have a headache or trouble speaking after a fall. ?This information is not intended to replace advice given to you by your health care provider. Make sure you discuss any questions you have with your health care  provider. ? ?Recombinant Zoster (Shingles) Vaccine: What You Need to Know ?1. Why get vaccinated? ?Recombinant zoster (shingles) vaccine can prevent shingles. ?Shingles (also called herpes zoster, or just zoster) is a painful skin rash, usually with blisters. In addition to the rash, shingles can cause fever, headache, chills, or upset stomach. Rarely, shingles can lead to complications such as pneumonia, hearing problems, blindness, brain inflammation (encephalitis), or death. ?The risk of shingles increases with age. The most common complication of shingles is long-term nerve pain called postherpetic neuralgia (PHN). PHN occurs in the areas where the shingles rash was and can last for months or years after the rash goes away. The pain from PHN can be severe and debilitating. ?The risk of PHN increases with age. An older adult with shingles is more likely to develop PHN and have longer lasting and more severe pain than a younger person. ?People with weakened immune systems also have a higher risk of getting shingles and complications from the disease. ?Shingles is caused by varicella-zoster virus, the same virus that causes chickenpox. After you have chickenpox, the virus stays in your body and  can cause shingles later in life. Shingles cannot be passed from one person to another, but the virus that causes shingles can spread and cause chickenpox in someone who has never had chickenpox or has never received chickenpox vaccine. ?2. Recombinant shingles vaccine ?Recombinant shingles vaccine provides strong protection against shingles. By preventing shingles, recombinant shingles vaccine also protects against PHN and other complications. ?Recombinant shingles vaccine is recommended for: ?Adults 50 years and older ?Adults 19 years and older who have a weakened immune system because of disease or treatments ?Shingles vaccine is given as a two-dose series. For most people, the second dose should be given 2 to 6 months  after the first dose. Some people who have or will have a weakened immune system can get the second dose 1 to 2 months after the first dose. Ask your health care provider for guidance. ?People who have had shingles in the past

## 2022-03-02 ENCOUNTER — Ambulatory Visit
Admission: RE | Admit: 2022-03-02 | Discharge: 2022-03-02 | Disposition: A | Discharge status: Home / Self Care | Payer: Managed Care, Other (non HMO) | Source: Ambulatory Visit | Attending: Internal Medicine | Admitting: Internal Medicine

## 2022-03-02 DIAGNOSIS — L729 Follicular cyst of the skin and subcutaneous tissue, unspecified: Secondary | ICD-10-CM | POA: Diagnosis present

## 2022-03-02 DIAGNOSIS — S300XXA Contusion of lower back and pelvis, initial encounter: Secondary | ICD-10-CM | POA: Diagnosis present

## 2022-03-02 DIAGNOSIS — W19XXXA Unspecified fall, initial encounter: Secondary | ICD-10-CM | POA: Diagnosis not present

## 2022-03-04 ENCOUNTER — Encounter: Payer: Self-pay | Admitting: Gastroenterology

## 2022-03-04 ENCOUNTER — Ambulatory Visit: Payer: Managed Care, Other (non HMO) | Admitting: Gastroenterology

## 2022-03-04 VITALS — BP 136/83 | HR 85 | Temp 98.3°F | Ht 61.0 in | Wt 171.8 lb

## 2022-03-04 DIAGNOSIS — K824 Cholesterolosis of gallbladder: Secondary | ICD-10-CM

## 2022-03-04 DIAGNOSIS — R1011 Right upper quadrant pain: Secondary | ICD-10-CM | POA: Diagnosis not present

## 2022-03-04 NOTE — Progress Notes (Signed)
?  ?Jonathon Bellows MD, MRCP(U.K) ?[address]  ?[address]  ?[place], [place] [address]  ?Main: [phone]  ?Fax: [phone] ? ? ?Primary Care Physician: Steele Sizer, MD ? ?Primary Gastroenterologist:  Dr. Jonathon Bellows  ? ?Chief Complaint  ?Patient presents with  ? ruq pain  ? ? ?HPI: Shelby Barry is a 56 y.o. female ? ? ?Summary of history : ? ?Initially referred and and seen on 12/29/2021 for RUQ pain. 11/20/2021: LFT's normal. Hb 13.4 grams.  ?  ?Right upper quadrant ultrasound in 08/11/2021 shows 2 small liver lesions most consistent with hemangioma.  Gallbladder polyps measuring 6 mm follow-up ultrasound in 6 months recommended  ?  ?She sees Dr Rogue Bussing for breast cancer . S/p chemo . ?  ?05/20/2016 : colonoscopy Normal repeat in 10 years.  ?  ?She states that she has had right lower quadrant pain for many years.  Describes it as a sharp pain usually precipitated by food intake within 10 minutes and lasting for an hour to.  Nonradiating.  No other clear aggravating factors.  No clear relieving factors.  Occasionally having a bowel movement helps.  Denies any constipation.  Denies any regular NSAID use.  She did have issues with nausea and vomiting during her chemotherapy.  Presently not really an issue.  She takes omeprazole once a day before breakfast in the morning.  She has some issues with hemorrhoids.  States finds it hard to wipe after bowel movement.  Denies any bleeding.  Denies any perianal discomfort. ? ?Interval history  12/29/2021-03/04/2022 ?01/05/2022: RUQ USG: 5 mm gall bladder polyps- no follow up needed ?01/08/2022: Ct abdomen : sigmoid diverticulosis ? ?Since the last visit the abdominal pain have all resolved.  Did not pick up her Bentyl.  No GI issues at this point of time. ? ? ?Current Outpatient Medications  ?Medication Sig Dispense Refill  ? acetaminophen (TYLENOL) 325 MG tablet Take 325 mg by mouth every 6 (six) hours as needed for fever.    ? albuterol (VENTOLIN HFA) 108 (90 Base)  MCG/ACT inhaler Inhale 1-2 puffs into the lungs every 6 (six) hours as needed for wheezing or shortness of breath. 1 g 0  ? amphetamine-dextroamphetamine (ADDERALL XR) 20 MG 24 hr capsule Take 1 capsule (20 mg total) by mouth daily. 30 capsule 0  ? [START ON 03/26/2022] amphetamine-dextroamphetamine (ADDERALL XR) 20 MG 24 hr capsule Take 1 capsule (20 mg total) by mouth daily. 30 capsule 0  ? [START ON 04/27/2022] amphetamine-dextroamphetamine (ADDERALL XR) 20 MG 24 hr capsule Take 1 capsule (20 mg total) by mouth daily. 30 capsule 0  ? dicyclomine (BENTYL) 10 MG capsule Take 1 capsule (10 mg total) by mouth 3 (three) times daily as needed for spasms. 30 capsule 0  ? loratadine (CLARITIN) 10 MG tablet Take 1 tablet (10 mg total) by mouth daily. (Patient not taking: Reported on 12/29/2021) 30 tablet 2  ? meloxicam (MOBIC) 15 MG tablet Take 1 tablet (15 mg total) by mouth daily. 90 tablet 0  ? omeprazole (PRILOSEC) 20 MG capsule TAKE 1 CAPSULE BY MOUTH  DAILY 90 capsule 0  ? sertraline (ZOLOFT) 50 MG tablet Take 1 tablet (50 mg total) by mouth daily. 90 tablet 0  ? zolpidem (AMBIEN) 5 MG tablet Take 1 tablet (5 mg total) by mouth at bedtime as needed for sleep. 90 tablet 0  ? ?No current facility-administered medications for this visit.  ? ? ?Allergies as of 03/04/2022 - Review Complete 02/24/2022  ?Allergen  Reaction Noted  ? Adhesive [tape] Other (See Comments) 10/01/2016  ? Nickel  10/06/2016  ? ? ?ROS: ? ?General: Negative for anorexia, weight loss, fever, chills, fatigue, weakness. ?ENT: Negative for hoarseness, difficulty swallowing , nasal congestion. ?CV: Negative for chest pain, angina, palpitations, dyspnea on exertion, peripheral edema.  ?Respiratory: Negative for dyspnea at rest, dyspnea on exertion, cough, sputum, wheezing.  ?GI: See history of present illness. ?GU:  Negative for dysuria, hematuria, urinary incontinence, urinary frequency, nocturnal urination.  ?Endo: Negative for unusual weight change.  ?   ?Physical Examination: ? ? There were no vitals taken for this visit. ? ?General: Well-nourished, well-developed in no acute distress.  ?Eyes: No icterus. Conjunctivae pink. ?Mouth: Oropharyngeal mucosa moist and pink , no lesions erythema or exudate. ?Neuro: Alert and oriented x 3.  Grossly intact. ?Skin: Warm and dry, no jaundice.   ?Psych: Alert and cooperative, normal mood and affect. ? ? ?Imaging Studies: ?US SOFT TISSUE LOWER EXTREMITY LIMITED RIGHT (NON-VASCULAR) ? ?Result Date: 03/02/2022 ?CLINICAL DATA:  Cyst-like lesion on right buttock after fall. EXAM: ULTRASOUND RIGHT LOWER EXTREMITY LIMITED TECHNIQUE: Ultrasound examination of the lower extremity soft tissues was performed in the area of clinical concern. COMPARISON:  None. FINDINGS: Targeted sonographic evaluation in the area of clinical concern labeled right buttock area of lump. Within the subcutaneous tissues is a 1.3 x 1.3 x 0.7 cm avascular hypoechoic lesion. There is no peripheral or internal hyperemia. IMPRESSION: Small avascular fluid collection in the area of clinical concern measuring 1.3 cm. In the setting of prior fall this likely represents hematoma. Recommend continued clinical follow-up. Electronically Signed   By: Keith Rake M.D.   On: 03/02/2022 21:23   ? ?Assessment and Plan:  ? ?Shelby Barry is a 56 y.o. y/o female  has been referred for RUQ pain.  Per history and examination pain is in the right lower quadrant.  The nature of the pain based on history has some features of biliary colic but the location is not in the region of the gallbladder.  H/o breast cancer. Found to have gall bladder polyps on last USG RUQ.  Since her last visit the abdominal pain spontaneously resolved presently has no pain whatsoever.  I explained to her if the pain returns to give our office a call and then we can decide if she has biliary pain secondary to the polyps or dyskinesia of the gallbladder. ?  ?  ? ? ?Dr Jonathon Bellows  MD,MRCP [facility]) ?Follow  up in as needed ? ?

## 2022-04-07 ENCOUNTER — Other Ambulatory Visit: Payer: Self-pay | Admitting: Family Medicine

## 2022-04-07 DIAGNOSIS — K219 Gastro-esophageal reflux disease without esophagitis: Secondary | ICD-10-CM

## 2022-05-24 NOTE — Progress Notes (Deleted)
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 She states medication helps her finishing what she starts that she was unable to do in the past .    Major depression chronic recurrent: she has a long history of depression over the years, her father committed suicide when she was in her 50's. She is renting her house, she stays in [place] with her son and the days she not working  and she stays with her sister in [place].when she is working   She placed her mother in the nursing home Cannot tolerate Lexapro, she was taking zoloft helped with symptoms but ran out medication and would like a refill.  She is on Ambien now and is able to fall and stay asleep , no side effects    History of breast cancer: still seeing hematologist, stopped anastrozole months ago, tried Tamoxifen for about 6 months but also stopped in 2019 on her own . She is up to date with mammogram, last mammogram was 12/22, she gets concerned when develops pain.    GERD: she has been taking Omeprazole daily, she states she has heartburn and epigastric pain if she skips one dose of medication. Stable.    Chronic bronchitis/asthma: she had a severe flare in Nov, went to urgent care, negative for flu and COVID-19. She was given prednisone and cough syrup. Currently doing well, no wheezing or cough. Denies SOB  RUQ pain and nausea: happening when she eats, oncologist ordered US that showed two small gallbladder polyps and she was referred to GI, however appointment  was re-schedule for Feb 2023. She has decrease portions to control the nausea. 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   Vitamin D deficiency 08/22/2015   LBP (low back pain) 08/22/2015   GAD (generalized anxiety disorder) 08/22/2015   History of anemia 08/17/2007   Insomnia 08/16/2007    Past Surgical History:  Procedure Laterality Date   ABDOMINAL HYSTERECTOMY  04/20/2014   BILATERAL SALPINGECTOMY     BREAST BIOPSY Right 09/27/2016   positive   BREAST LUMPECTOMY Right 10/12/2016   positive   BREAST LUMPECTOMY WITH SENTINEL LYMPH NODE BIOPSY Right 10/12/2016   Procedure: BREAST LUMPECTOMY WITH SENTINEL LYMPH NODE BX;  Surgeon: Christene Lye, MD;  Location: [facility];  Service: General;  Laterality: Right;   COLONOSCOPY WITH PROPOFOL N/A 05/20/2016   Procedure: COLONOSCOPY WITH PROPOFOL;  Surgeon: Lucilla Lame, MD;  Location: [facility];  Service: Endoscopy;  Laterality: N/A;   ECTOPIC PREGNANCY SURGERY  ENDOMETRIAL ABLATION     PORTACATH PLACEMENT Left 11/04/2016   Procedure: INSERTION PORT-A-CATH;  Surgeon: Christene Lye, MD;  Location: [facility];  Service: General;  Laterality: Left;  Left subclavian vein   TUBAL LIGATION      Family History  Problem Relation Age of Onset   Heart disease Mother    Depression Father    Obesity Father    Diabetes Father    Alcohol abuse Father    COPD Father    Alcohol abuse Brother    Seizures Brother    Breast cancer Sister 34    Social History   Tobacco Use   Smoking status:  Former    Packs/day: 0.50    Years: 20.00    Total pack years: 10.00    Types: Cigarettes    Quit date: 08/23/2016    Years since quitting: 5.7   Smokeless tobacco: Never  Substance Use Topics   Alcohol use: Yes    Alcohol/week: 0.0 standard drinks of alcohol    Comment: occassional     Current Outpatient Medications:    omeprazole (PRILOSEC) 20 MG capsule, TAKE 1 CAPSULE BY MOUTH DAILY, Disp: 90 capsule, Rfl: 0   acetaminophen (TYLENOL) 325 MG tablet, Take 325 mg by mouth every 6 (six) hours as needed for fever., Disp: , Rfl:    albuterol (VENTOLIN HFA) 108 (90 Base) MCG/ACT inhaler, Inhale 1-2 puffs into the lungs every 6 (six) hours as needed for wheezing or shortness of breath., Disp: 1 g, Rfl: 0   amphetamine-dextroamphetamine (ADDERALL XR) 20 MG 24 hr capsule, Take 1 capsule (20 mg total) by mouth daily., Disp: 30 capsule, Rfl: 0   amphetamine-dextroamphetamine (ADDERALL XR) 20 MG 24 hr capsule, Take 1 capsule (20 mg total) by mouth daily., Disp: 30 capsule, Rfl: 0   amphetamine-dextroamphetamine (ADDERALL XR) 20 MG 24 hr capsule, Take 1 capsule (20 mg total) by mouth daily., Disp: 30 capsule, Rfl: 0   dicyclomine (BENTYL) 10 MG capsule, Take 1 capsule (10 mg total) by mouth 3 (three) times daily as needed for spasms., Disp: 30 capsule, Rfl: 0   loratadine (CLARITIN) 10 MG tablet, Take 1 tablet (10 mg total) by mouth daily., Disp: 30 tablet, Rfl: 2   meloxicam (MOBIC) 15 MG tablet, Take 1 tablet (15 mg total) by mouth daily., Disp: 90 tablet, Rfl: 0   sertraline (ZOLOFT) 50 MG tablet, Take 1 tablet (50 mg total) by mouth daily., Disp: 90 tablet, Rfl: 0   zolpidem (AMBIEN) 5 MG tablet, Take 1 tablet (5 mg total) by mouth at bedtime as needed for sleep., Disp: 90 tablet, Rfl: 0  Allergies  Allergen Reactions   Adhesive [Tape] Other (See Comments)    Blisters/rash  PLEASE USE PAPER TAPE ONLY   Nickel     PT CAN WEAR GOLD, STERLING SILVER WITH NO PROBLEM-PT HAS NEVER HAD ANY  PROBLEMS IN THE OR    I personally reviewed active problem list, medication list, allergies, family history, social history, health maintenance with the patient/caregiver today.   ROS  ***  Objective  There were no vitals filed for this visit.  There is no height or weight on file to calculate BMI.  Physical Exam ***  No results found for this or any previous visit (from the past 2160 hour(s)).   PHQ2/9:    02/24/2022   10:45 AM 11/20/2021    1:03 PM 06/16/2021    1:23 PM 03/17/2021    1:39 PM 12/16/2020  8:46 AM  Depression screen PHQ 2/9  Decreased Interest 0 0 0 1 0  Down, Depressed, Hopeless 0 0 0 0 0  PHQ - 2 Score 0 0 0 1 0  Altered sleeping 0 '3 3 3   '$ Tired, decreased energy 0 0 3 1   Change in appetite 0 0 0 0   Feeling bad or failure about yourself  0 0 0 0   Trouble concentrating 0 0 0 0   Moving slowly or fidgety/restless 0 0 0 0   Suicidal thoughts 0 0 0 0   PHQ-9 Score 0 '3 6 5   '$ Difficult doing work/chores Not difficult at all        phq 9 is {gen pos [id number]}   Fall Risk:    02/24/2022   10:45 AM 11/20/2021    1:03 PM 06/16/2021    1:23 PM 03/17/2021    1:38 PM 12/16/2020    8:46 AM  Fall Risk   Falls in the past year? 1 0 0 0 0  Number falls in past yr: 1 0 0 0 0  Injury with Fall? 1 0 0 0 0  Risk for fall due to :  No Fall Risks     Follow up  Falls prevention discussed         Functional Status Survey:      Assessment & Plan  *** There are no diagnoses linked to this encounter.

## 2022-05-26 ENCOUNTER — Ambulatory Visit: Payer: Managed Care, Other (non HMO) | Admitting: Family Medicine

## 2022-06-15 NOTE — Progress Notes (Deleted)
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 She states medication helps her finishing what she starts that she was unable to do in the past .    Major depression chronic recurrent: she has a long history of depression over the years, her father committed suicide when she was in her 72's. She is renting her house, she stays in [place] with her son and the days she not working  and she stays with her sister in [place].when she is working   She placed her mother in the nursing home Cannot tolerate Lexapro, she was taking zoloft helped with symptoms but ran out medication and would like a refill.  She is on Ambien now and is able to fall and stay asleep , no side effects    History of breast cancer: still seeing hematologist, stopped anastrozole months ago, tried Tamoxifen for about 6 months but also stopped in 2019 on her own . She is up to date with mammogram, last mammogram was 12/22, she gets concerned when develops pain.    GERD: she has been taking Omeprazole daily, she states she has heartburn and epigastric pain if she skips one dose of medication. Stable.    Chronic bronchitis/asthma: she had a severe flare in Nov, went to urgent care, negative for flu and COVID-19. She was given prednisone and cough syrup. Currently doing well, no wheezing or cough. Denies SOB  RUQ pain and nausea: happening when she eats, oncologist ordered US that showed two small gallbladder polyps and she was referred to GI, however  appointment was re-schedule for Feb 2023. She has decrease portions to control the nausea. 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, estrogen receptor positive ([place]) 10/26/2016   Recurrent major depression-severe ([place]) 08/22/2015   Bursitis of shoulder 08/22/2015   Bilateral cataracts 08/22/2015   Circadian rhythm sleep disorder, 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   Vitamin D deficiency 08/22/2015   LBP (low back pain) 08/22/2015   GAD (generalized anxiety disorder) 08/22/2015   History of anemia 08/17/2007   Insomnia 08/16/2007    Past Surgical History:  Procedure Laterality Date   ABDOMINAL HYSTERECTOMY  04/20/2014   BILATERAL SALPINGECTOMY     BREAST BIOPSY Right 09/27/2016   positive   BREAST LUMPECTOMY Right 10/12/2016   positive   BREAST LUMPECTOMY WITH SENTINEL LYMPH NODE BIOPSY Right 10/12/2016   Procedure: BREAST LUMPECTOMY WITH SENTINEL LYMPH NODE BX;  Surgeon: Christene Lye, MD;  Location: [facility];  Service: General;  Laterality: Right;   COLONOSCOPY WITH PROPOFOL N/A 05/20/2016   Procedure: COLONOSCOPY WITH PROPOFOL;  Surgeon: Lucilla Lame, MD;  Location: [place];  Service: Endoscopy;  Laterality: N/A;   ECTOPIC PREGNANCY SURGERY  ENDOMETRIAL ABLATION     PORTACATH PLACEMENT Left 11/04/2016   Procedure: INSERTION PORT-A-CATH;  Surgeon: Christene Lye, MD;  Location: [facility];  Service: General;  Laterality: Left;  Left subclavian vein   TUBAL LIGATION      Family History  Problem Relation Age of Onset   Heart disease Mother    Depression Father    Obesity Father    Diabetes Father    Alcohol abuse Father    COPD Father    Alcohol abuse Brother    Seizures Brother    Breast cancer Sister 51    Social History   Tobacco Use   Smoking  status: Former    Packs/day: 0.50    Years: 20.00    Total pack years: 10.00    Types: Cigarettes    Quit date: 08/23/2016    Years since quitting: 5.8   Smokeless tobacco: Never  Substance Use Topics   Alcohol use: Yes    Alcohol/week: 0.0 standard drinks of alcohol    Comment: occassional     Current Outpatient Medications:    omeprazole (PRILOSEC) 20 MG capsule, TAKE 1 CAPSULE BY MOUTH DAILY, Disp: 90 capsule, Rfl: 0   acetaminophen (TYLENOL) 325 MG tablet, Take 325 mg by mouth every 6 (six) hours as needed for fever., Disp: , Rfl:    albuterol (VENTOLIN HFA) 108 (90 Base) MCG/ACT inhaler, Inhale 1-2 puffs into the lungs every 6 (six) hours as needed for wheezing or shortness of breath., Disp: 1 g, Rfl: 0   amphetamine-dextroamphetamine (ADDERALL XR) 20 MG 24 hr capsule, Take 1 capsule (20 mg total) by mouth daily., Disp: 30 capsule, Rfl: 0   amphetamine-dextroamphetamine (ADDERALL XR) 20 MG 24 hr capsule, Take 1 capsule (20 mg total) by mouth daily., Disp: 30 capsule, Rfl: 0   amphetamine-dextroamphetamine (ADDERALL XR) 20 MG 24 hr capsule, Take 1 capsule (20 mg total) by mouth daily., Disp: 30 capsule, Rfl: 0   dicyclomine (BENTYL) 10 MG capsule, Take 1 capsule (10 mg total) by mouth 3 (three) times daily as needed for spasms., Disp: 30 capsule, Rfl: 0   loratadine (CLARITIN) 10 MG tablet, Take 1 tablet (10 mg total) by mouth daily., Disp: 30 tablet, Rfl: 2   meloxicam (MOBIC) 15 MG tablet, Take 1 tablet (15 mg total) by mouth daily., Disp: 90 tablet, Rfl: 0   sertraline (ZOLOFT) 50 MG tablet, Take 1 tablet (50 mg total) by mouth daily., Disp: 90 tablet, Rfl: 0   zolpidem (AMBIEN) 5 MG tablet, Take 1 tablet (5 mg total) by mouth at bedtime as needed for sleep., Disp: 90 tablet, Rfl: 0  Allergies  Allergen Reactions   Adhesive [Tape] Other (See Comments)    Blisters/rash  PLEASE USE PAPER TAPE ONLY   Nickel     PT CAN WEAR GOLD, STERLING SILVER WITH NO PROBLEM-PT HAS NEVER HAD  ANY PROBLEMS IN THE OR    I personally reviewed active problem list, medication list, allergies, family history, social history, health maintenance with the patient/caregiver today.   ROS  ***  Objective  There were no vitals filed for this visit.  There is no height or weight on file to calculate BMI.  Physical Exam ***  No results found for this or any previous visit (from the past 2160 hour(s)).   PHQ2/9:    02/24/2022   10:45 AM 11/20/2021    1:03 PM 06/16/2021    1:23 PM 03/17/2021    1:39 PM 12/16/2020  8:46 AM  Depression screen PHQ 2/9  Decreased Interest 0 0 0 1 0  Down, Depressed, Hopeless 0 0 0 0 0  PHQ - 2 Score 0 0 0 1 0  Altered sleeping 0 '3 3 3   '$ Tired, decreased energy 0 0 3 1   Change in appetite 0 0 0 0   Feeling bad or failure about yourself  0 0 0 0   Trouble concentrating 0 0 0 0   Moving slowly or fidgety/restless 0 0 0 0   Suicidal thoughts 0 0 0 0   PHQ-9 Score 0 '3 6 5   '$ Difficult doing work/chores Not difficult at all        phq 9 is {gen pos [id number]}   Fall Risk:    02/24/2022   10:45 AM 11/20/2021    1:03 PM 06/16/2021    1:23 PM 03/17/2021    1:38 PM 12/16/2020    8:46 AM  Fall Risk   Falls in the past year? 1 0 0 0 0  Number falls in past yr: 1 0 0 0 0  Injury with Fall? 1 0 0 0 0  Risk for fall due to :  No Fall Risks     Follow up  Falls prevention discussed         Functional Status Survey:      Assessment & Plan  *** There are no diagnoses linked to this encounter.

## 2022-06-16 ENCOUNTER — Ambulatory Visit: Payer: Managed Care, Other (non HMO) | Admitting: Family Medicine

## 2022-06-28 ENCOUNTER — Other Ambulatory Visit: Payer: Self-pay | Admitting: Family Medicine

## 2022-06-28 DIAGNOSIS — K219 Gastro-esophageal reflux disease without esophagitis: Secondary | ICD-10-CM

## 2022-07-06 NOTE — Progress Notes (Deleted)
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 She states medication helps her finishing what she starts that she was unable to do in the past .    Major depression chronic recurrent: she has a long history of depression over the years, her father committed suicide when she was in her 63's. She is renting her house, she stays in [place] with her son and the days she not working  and she stays with her sister in [place].when she is working   She placed her mother in the nursing home Cannot tolerate Lexapro, she was taking zoloft helped with symptoms but ran out medication and would like a refill.  She is on Ambien now and is able to fall and stay asleep , no side effects    History of breast cancer: still seeing hematologist, stopped anastrozole months ago, tried Tamoxifen for about 6 months but also stopped in 2019 on her own . She is up to date with mammogram, last mammogram was 12/22, she gets concerned when develops pain.    GERD: she has been taking Omeprazole daily, she states she has heartburn and epigastric pain if she skips one dose of medication. Stable.    Chronic bronchitis/asthma: she had a severe flare in Nov, went to urgent care, negative for flu and COVID-19. She was given prednisone and cough syrup. Currently doing well, no wheezing or cough. Denies SOB  RUQ pain and nausea: happening when she eats, oncologist ordered US that showed two small gallbladder polyps and she was referred to GI, however  appointment was re-schedule for Feb 2023. She has decrease portions to control the nausea. 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, estrogen receptor positive ([place]) 10/26/2016   Recurrent major depression-severe ([place]) 08/22/2015   Bursitis of shoulder 08/22/2015   Bilateral cataracts 08/22/2015   Circadian rhythm sleep disorder, 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   Vitamin D deficiency 08/22/2015   LBP (low back pain) 08/22/2015   GAD (generalized anxiety disorder) 08/22/2015   History of anemia 08/17/2007   Insomnia 08/16/2007    Past Surgical History:  Procedure Laterality Date   ABDOMINAL HYSTERECTOMY  04/20/2014   BILATERAL SALPINGECTOMY     BREAST BIOPSY Right 09/27/2016   positive   BREAST LUMPECTOMY Right 10/12/2016   positive   BREAST LUMPECTOMY WITH SENTINEL LYMPH NODE BIOPSY Right 10/12/2016   Procedure: BREAST LUMPECTOMY WITH SENTINEL LYMPH NODE BX;  Surgeon: Christene Lye, MD;  Location: [facility];  Service: General;  Laterality: Right;   COLONOSCOPY WITH PROPOFOL N/A 05/20/2016   Procedure: COLONOSCOPY WITH PROPOFOL;  Surgeon: Lucilla Lame, MD;  Location: [place];  Service: Endoscopy;  Laterality: N/A;   ECTOPIC PREGNANCY SURGERY  ENDOMETRIAL ABLATION     PORTACATH PLACEMENT Left 11/04/2016   Procedure: INSERTION PORT-A-CATH;  Surgeon: Christene Lye, MD;  Location: [facility];  Service: General;  Laterality: Left;  Left subclavian vein   TUBAL LIGATION      Family History  Problem Relation Age of Onset   Heart disease Mother    Depression Father    Obesity Father    Diabetes Father    Alcohol abuse Father    COPD Father    Alcohol abuse Brother    Seizures Brother    Breast cancer Sister 23    Social History   Tobacco Use   Smoking  status: Former    Packs/day: 0.50    Years: 20.00    Total pack years: 10.00    Types: Cigarettes    Quit date: 08/23/2016    Years since quitting: 5.8   Smokeless tobacco: Never  Substance Use Topics   Alcohol use: Yes    Alcohol/week: 0.0 standard drinks of alcohol    Comment: occassional     Current Outpatient Medications:    omeprazole (PRILOSEC) 20 MG capsule, TAKE 1 CAPSULE BY MOUTH DAILY, Disp: 90 capsule, Rfl: 0   acetaminophen (TYLENOL) 325 MG tablet, Take 325 mg by mouth every 6 (six) hours as needed for fever., Disp: , Rfl:    albuterol (VENTOLIN HFA) 108 (90 Base) MCG/ACT inhaler, Inhale 1-2 puffs into the lungs every 6 (six) hours as needed for wheezing or shortness of breath., Disp: 1 g, Rfl: 0   amphetamine-dextroamphetamine (ADDERALL XR) 20 MG 24 hr capsule, Take 1 capsule (20 mg total) by mouth daily., Disp: 30 capsule, Rfl: 0   amphetamine-dextroamphetamine (ADDERALL XR) 20 MG 24 hr capsule, Take 1 capsule (20 mg total) by mouth daily., Disp: 30 capsule, Rfl: 0   amphetamine-dextroamphetamine (ADDERALL XR) 20 MG 24 hr capsule, Take 1 capsule (20 mg total) by mouth daily., Disp: 30 capsule, Rfl: 0   dicyclomine (BENTYL) 10 MG capsule, Take 1 capsule (10 mg total) by mouth 3 (three) times daily as needed for spasms., Disp: 30 capsule, Rfl: 0   loratadine (CLARITIN) 10 MG tablet, Take 1 tablet (10 mg total) by mouth daily., Disp: 30 tablet, Rfl: 2   meloxicam (MOBIC) 15 MG tablet, Take 1 tablet (15 mg total) by mouth daily., Disp: 90 tablet, Rfl: 0   sertraline (ZOLOFT) 50 MG tablet, Take 1 tablet (50 mg total) by mouth daily., Disp: 90 tablet, Rfl: 0   zolpidem (AMBIEN) 5 MG tablet, Take 1 tablet (5 mg total) by mouth at bedtime as needed for sleep., Disp: 90 tablet, Rfl: 0  Allergies  Allergen Reactions   Adhesive [Tape] Other (See Comments)    Blisters/rash  PLEASE USE PAPER TAPE ONLY   Nickel     PT CAN WEAR GOLD, STERLING SILVER WITH NO PROBLEM-PT HAS NEVER HAD  ANY PROBLEMS IN THE OR    I personally reviewed active problem list, medication list, allergies, family history, social history, health maintenance with the patient/caregiver today.   ROS  ***  Objective  There were no vitals filed for this visit.  There is no height or weight on file to calculate BMI.  Physical Exam ***  No results found for this or any previous visit (from the past 2160 hour(s)).   PHQ2/9:    02/24/2022   10:45 AM 11/20/2021    1:03 PM 06/16/2021    1:23 PM 03/17/2021    1:39 PM 12/16/2020  8:46 AM  Depression screen PHQ 2/9  Decreased Interest 0 0 0 1 0  Down, Depressed, Hopeless 0 0 0 0 0  PHQ - 2 Score 0 0 0 1 0  Altered sleeping 0 '3 3 3   '$ Tired, decreased energy 0 0 3 1   Change in appetite 0 0 0 0   Feeling bad or failure about yourself  0 0 0 0   Trouble concentrating 0 0 0 0   Moving slowly or fidgety/restless 0 0 0 0   Suicidal thoughts 0 0 0 0   PHQ-9 Score 0 '3 6 5   '$ Difficult doing work/chores Not difficult at all        phq 9 is {gen pos [id number]}   Fall Risk:    02/24/2022   10:45 AM 11/20/2021    1:03 PM 06/16/2021    1:23 PM 03/17/2021    1:38 PM 12/16/2020    8:46 AM  Fall Risk   Falls in the past year? 1 0 0 0 0  Number falls in past yr: 1 0 0 0 0  Injury with Fall? 1 0 0 0 0  Risk for fall due to :  No Fall Risks     Follow up  Falls prevention discussed         Functional Status Survey:      Assessment & Plan  *** There are no diagnoses linked to this encounter.

## 2022-07-07 ENCOUNTER — Ambulatory Visit: Payer: Managed Care, Other (non HMO) | Admitting: Family Medicine

## 2022-07-08 ENCOUNTER — Ambulatory Visit: Payer: Managed Care, Other (non HMO) | Admitting: Family Medicine

## 2022-07-08 ENCOUNTER — Encounter: Payer: Self-pay | Admitting: Family Medicine

## 2022-07-08 VITALS — BP 142/84 | HR 96 | Temp 98.2°F | Resp 16 | Ht 61.5 in | Wt 170.7 lb

## 2022-07-08 DIAGNOSIS — Z008 Encounter for other general examination: Secondary | ICD-10-CM

## 2022-07-08 DIAGNOSIS — F339 Major depressive disorder, recurrent, unspecified: Secondary | ICD-10-CM

## 2022-07-08 DIAGNOSIS — C50511 Malignant neoplasm of lower-outer quadrant of right female breast: Secondary | ICD-10-CM

## 2022-07-08 DIAGNOSIS — Z23 Encounter for immunization: Secondary | ICD-10-CM | POA: Diagnosis not present

## 2022-07-08 DIAGNOSIS — F902 Attention-deficit hyperactivity disorder, combined type: Secondary | ICD-10-CM | POA: Diagnosis not present

## 2022-07-08 DIAGNOSIS — F5104 Psychophysiologic insomnia: Secondary | ICD-10-CM

## 2022-07-08 DIAGNOSIS — K219 Gastro-esophageal reflux disease without esophagitis: Secondary | ICD-10-CM

## 2022-07-08 DIAGNOSIS — Z170 Estrogen receptor positive status [ER+]: Secondary | ICD-10-CM

## 2022-07-08 DIAGNOSIS — J4489 Other specified chronic obstructive pulmonary disease: Secondary | ICD-10-CM

## 2022-07-08 DIAGNOSIS — J449 Chronic obstructive pulmonary disease, unspecified: Secondary | ICD-10-CM

## 2022-07-08 DIAGNOSIS — R739 Hyperglycemia, unspecified: Secondary | ICD-10-CM

## 2022-07-08 DIAGNOSIS — E785 Hyperlipidemia, unspecified: Secondary | ICD-10-CM

## 2022-07-08 MED ORDER — AMPHETAMINE-DEXTROAMPHET ER 20 MG PO CP24: 20.0000 mg | ORAL_CAPSULE | Freq: Every day | ORAL | 0 refills | Status: DC

## 2022-07-08 MED ORDER — SHINGRIX 50 MCG/0.5ML IM SUSR: 0.5000 mL | Freq: Once | INTRAMUSCULAR | 0 refills | Status: AC

## 2022-07-08 MED ORDER — ZOLPIDEM TARTRATE 5 MG PO TABS: 5.0000 mg | ORAL_TABLET | Freq: Every evening | ORAL | 0 refills | Status: DC | PRN

## 2022-07-08 MED ORDER — SERTRALINE HCL 50 MG PO TABS: 50.0000 mg | ORAL_TABLET | Freq: Every day | ORAL | 1 refills | Status: DC

## 2022-07-08 MED ORDER — OMEPRAZOLE 20 MG PO CPDR: 20.0000 mg | DELAYED_RELEASE_CAPSULE | Freq: Every day | ORAL | 3 refills | Status: DC

## 2022-07-08 NOTE — Progress Notes (Signed)
Name: Shelby Barry   MRN: [id number]    DOB: 03-13-1966   Date:07/08/2022       Progress Note  Chief Complaint  Patient presents with   Follow-up   COPD   Gastroesophageal Reflux   ADHD   Anxiety     Subjective:   Shelby Barry is a 56 y.o. female, presents to clinic for biometric screening and med refills  ADHD: Last refill in June of XR adderall 20 mg Benefit from med (ie increase in concentration): Yes - but she wants different meds and dose increases Pt denies CP, rapid HR, dry mouth, weight loss, difficulty sleeping, agitation/mood disturbances.  Reviewed VS today with stimulant meds in adult:  BP slightly elevated, she appears anxious and aggitated BP Readings from Last 3 Encounters:  07/08/22 (!) 142/84  03/04/22 136/83  02/24/22 122/80   HR WNL Pulse Readings from Last 3 Encounters:  07/08/22 96  03/04/22 85  02/24/22 (!) 102    PDMP:  Controlled Substance Database was reviewed today, reviewed and no suspicious findings.   Biometric screening for employee health screening papers brought in - VS waist circ, labs Lab Results  Component Value Date   CHOL 199 11/20/2021   HDL 72 11/20/2021   LDLCALC 104 (H) 11/20/2021   TRIG 136 11/20/2021   CHOLHDL 2.8 11/20/2021   The 10-year ASCVD risk score (Arnett DK, et al., 2019) is: 2%   Values used to calculate the score:     Age: 58 years     Sex: Female     Is Non-Hispanic African American: No     Diabetic: No     Tobacco smoker: No     Systolic Blood Pressure: 638 mmHg     Is BP treated: No     HDL Cholesterol: 72 mg/dL     Total Cholesterol: 199 mg/dL  No hx of HTN - not on meds  BP Readings from Last 5 Encounters:  07/08/22 (!) 140/84  03/04/22 136/83  02/24/22 122/80  12/29/21 133/80  11/20/21 128/82    Some weight loss - working on losing weight Wt Readings from Last 5 Encounters:  07/08/22 170 lb 11.2 oz (77.4 kg)  03/04/22 171 lb 12.8 oz (77.9 kg)  02/24/22 173 lb 11.2 oz (78.8 kg)   12/29/21 175 lb (79.4 kg)  11/20/21 176 lb (79.8 kg)   BMI Readings from Last 5 Encounters:  07/08/22 31.73 kg/m  03/04/22 32.46 kg/m  02/24/22 32.82 kg/m  12/29/21 32.53 kg/m  11/20/21 33.25 kg/m   Lab Results  Component Value Date   HGBA1C 5.3 11/20/2021  No DX but elevated glucose int he past   Zoloft - ran out - stressed today - reviewed prior notes from Shelby Barry PCP sig psych hx, hx of family completed suicide - pt doesn't know if she needs it or not, but says she will take if its refilled    07/08/2022    9:53 AM 02/24/2022   10:45 AM 11/20/2021    1:03 PM 06/16/2021    1:23 PM 03/17/2021    1:39 PM  Depression screen PHQ 2/9  Decreased Interest 0 0 0 0 1  Down, Depressed, Hopeless 0 0 0 0 0  PHQ - 2 Score 0 0 0 0 1  Altered sleeping 0 0 '3 3 3  '$ Tired, decreased energy 0 0 0 3 1  Change in appetite 0 0 0 0 0  Feeling bad or failure about yourself  0 0  0 0 0  Trouble concentrating 0 0 0 0 0  Moving slowly or fidgety/restless 0 0 0 0 0  Suicidal thoughts 0 0 0 0 0  PHQ-9 Score 0 0 '3 6 5  '$ Difficult doing work/chores Not difficult at all Not difficult at all         07/08/2022    9:54 AM 10/09/2019   12:11 PM 06/14/2018   11:11 AM  GAD 7 : Generalized Anxiety Score  Nervous, Anxious, on Edge 0 0 1  Control/stop worrying 0 1 1  Worry too much - different things 0 1 1  Trouble relaxing 0 3 1  Restless 0 3 0  Easily annoyed or irritable 0 3 0  Afraid - awful might happen 0 1 0  Total GAD 7 Score 0 12 4  Anxiety Difficulty Not difficult at all Very difficult    Insomnia - on ambien for years - controlled stubstance database reviewed, last refill recent but prescribed by Shelby Barry in Dec  GERD - she reports daily PPI, has been to GI, hx of hiatal hernia  COPD- rescue inhaler, she denies any recent exacerbations     Current Outpatient Medications:    acetaminophen (TYLENOL) 325 MG tablet, Take 325 mg by mouth every 6 (six) hours as needed for fever.,  Disp: , Rfl:    albuterol (VENTOLIN HFA) 108 (90 Base) MCG/ACT inhaler, Inhale 1-2 puffs into the lungs every 6 (six) hours as needed for wheezing or shortness of breath., Disp: 1 g, Rfl: 0   omeprazole (PRILOSEC) 20 MG capsule, TAKE 1 CAPSULE BY MOUTH DAILY, Disp: 90 capsule, Rfl: 0   amphetamine-dextroamphetamine (ADDERALL XR) 20 MG 24 hr capsule, Take 1 capsule (20 mg total) by mouth daily., Disp: 30 capsule, Rfl: 0   amphetamine-dextroamphetamine (ADDERALL XR) 20 MG 24 hr capsule, Take 1 capsule (20 mg total) by mouth daily., Disp: 30 capsule, Rfl: 0   amphetamine-dextroamphetamine (ADDERALL XR) 20 MG 24 hr capsule, Take 1 capsule (20 mg total) by mouth daily., Disp: 30 capsule, Rfl: 0   dicyclomine (BENTYL) 10 MG capsule, Take 1 capsule (10 mg total) by mouth 3 (three) times daily as needed for spasms. (Patient not taking: Reported on 07/08/2022), Disp: 30 capsule, Rfl: 0   loratadine (CLARITIN) 10 MG tablet, Take 1 tablet (10 mg total) by mouth daily. (Patient not taking: Reported on 07/08/2022), Disp: 30 tablet, Rfl: 2   meloxicam (MOBIC) 15 MG tablet, Take 1 tablet (15 mg total) by mouth daily. (Patient not taking: Reported on 07/08/2022), Disp: 90 tablet, Rfl: 0   sertraline (ZOLOFT) 50 MG tablet, Take 1 tablet (50 mg total) by mouth daily. (Patient not taking: Reported on 07/08/2022), Disp: 90 tablet, Rfl: 0   zolpidem (AMBIEN) 5 MG tablet, Take 1 tablet (5 mg total) by mouth at bedtime as needed for sleep. 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, estrogen receptor positive ([place]) 10/26/2016   Recurrent major depression-severe ([place]) 08/22/2015   Bursitis of shoulder 08/22/2015   Bilateral cataracts 08/22/2015   Circadian rhythm sleep disorder, shift  work type 08/22/2015   Vitamin D deficiency 08/22/2015   LBP (low back pain) 08/22/2015   GAD (generalized anxiety disorder) 08/22/2015   History of anemia 08/17/2007  Insomnia 08/16/2007    Past Surgical History:  Procedure Laterality Date   ABDOMINAL HYSTERECTOMY  04/20/2014   BILATERAL SALPINGECTOMY     BREAST BIOPSY Right 09/27/2016   positive   BREAST LUMPECTOMY Right 10/12/2016   positive   BREAST LUMPECTOMY WITH SENTINEL LYMPH NODE BIOPSY Right 10/12/2016   Procedure: BREAST LUMPECTOMY WITH SENTINEL LYMPH NODE BX;  Surgeon: Christene Lye, MD;  Location: [facility];  Service: General;  Laterality: Right;   COLONOSCOPY WITH PROPOFOL N/A 05/20/2016   Procedure: COLONOSCOPY WITH PROPOFOL;  Surgeon: Lucilla Lame, MD;  Location: [facility];  Service: Endoscopy;  Laterality: N/A;   ECTOPIC PREGNANCY SURGERY     ENDOMETRIAL ABLATION     PORTACATH PLACEMENT Left 11/04/2016   Procedure: INSERTION PORT-A-CATH;  Surgeon: Christene Lye, MD;  Location: [facility];  Service: General;  Laterality: Left;  Left subclavian vein   TUBAL LIGATION      Family History  Problem Relation Age of Onset   Heart disease Mother    Depression Father    Obesity Father    Diabetes Father    Alcohol abuse Father    COPD Father    Alcohol abuse Brother    Seizures Brother    Breast cancer Sister 84    Social History   Tobacco Use   Smoking status: Former    Packs/day: 0.50    Years: 20.00    Total pack years: 10.00    Types: Cigarettes    Quit date: 08/23/2016    Years since quitting: 5.8   Smokeless tobacco: Never  Vaping Use   Vaping Use: Never used  Substance Use Topics   Alcohol use: Yes    Alcohol/week: 0.0 standard drinks of alcohol    Comment: occassional   Drug use: No     Allergies  Allergen Reactions   Adhesive [Tape] Other (See Comments)    Blisters/rash  PLEASE USE PAPER TAPE ONLY   Nickel     PT CAN WEAR GOLD, STERLING SILVER WITH NO PROBLEM-PT  HAS NEVER HAD ANY PROBLEMS IN THE OR    Health Maintenance  Topic Date Due   Zoster Vaccines- Shingrix (1 of 2) Never done   COVID-19 Vaccine (3 - Moderna risk series) 08/19/2020   INFLUENZA VACCINE  06/22/2022   MAMMOGRAM  11/17/2022   COLONOSCOPY (Pts 45-75yr Insurance coverage will need to be confirmed)  05/20/2026   TETANUS/TDAP  10/08/2029   Hepatitis C Screening  Completed   HIV Screening  Completed   HPV VACCINES  Aged Out    Chart Review Today: I have reviewed the patient's medical history in detail and updated the computerized patient record.   Review of Systems  Constitutional: Negative.   HENT: Negative.    Eyes: Negative.   Respiratory: Negative.    Cardiovascular: Negative.   Gastrointestinal: Negative.   Endocrine: Negative.   Genitourinary: Negative.   Musculoskeletal: Negative.   Skin: Negative.   Allergic/Immunologic: Negative.   Neurological: Negative.   Hematological: Negative.   Psychiatric/Behavioral: Negative.    All other systems reviewed and are negative.    Objective:   Vitals:   07/08/22 0955 07/08/22 1032  BP: (!) 140/84 (!) 142/84  Pulse: 96   Resp: 16   Temp: 98.2 F (36.8 C)   TempSrc: Oral   SpO2: 96%   Weight: 170 lb 11.2 oz (77.4 kg)   Height: 5' 1.5" (1.562 m)     Body mass index is 31.73 kg/m.  Physical  Exam Vitals and nursing note reviewed.  Constitutional:      General: She is not in acute distress.    Appearance: Normal appearance. She is well-developed. She is obese. She is not ill-appearing, toxic-appearing or diaphoretic.     Interventions: Face mask in place.  HENT:     Head: Normocephalic and atraumatic.     Right Ear: External ear normal.     Left Ear: External ear normal.  Eyes:     General: Lids are normal. No scleral icterus.       Right eye: No discharge.        Left eye: No discharge.     Conjunctiva/sclera: Conjunctivae normal.  Neck:     Trachea: Phonation normal. No tracheal deviation.   Cardiovascular:     Rate and Rhythm: Normal rate and regular rhythm.     Pulses: Normal pulses.          Radial pulses are 2+ on the right side and 2+ on the left side.       Posterior tibial pulses are 2+ on the right side and 2+ on the left side.     Heart sounds: Normal heart sounds. No murmur heard.    No friction rub. No gallop.  Pulmonary:     Effort: Pulmonary effort is normal. No respiratory distress.     Breath sounds: Normal breath sounds. No stridor. No wheezing, rhonchi or rales.  Chest:     Chest wall: No tenderness.  Abdominal:     General: Bowel sounds are normal. There is no distension.     Palpations: Abdomen is soft.  Musculoskeletal:     Right lower leg: No edema.     Left lower leg: No edema.  Skin:    General: Skin is warm and dry.     Coloration: Skin is not jaundiced or pale.     Findings: No rash.  Neurological:     Mental Status: She is alert.     Motor: No abnormal muscle tone.     Gait: Gait normal.  Psychiatric:        Attention and Perception: She is inattentive.        Mood and Affect: Mood is anxious.        Speech: Speech is rapid and pressured and tangential.        Behavior: Behavior is agitated and hyperactive. Behavior is not aggressive.        Thought Content: Thought content does not include suicidal ideation. Thought content does not include suicidal plan.     Comments: Fidgeting or shaking knee, or pacing room throughout visit, unable to hold discussion beyond a few minutes and answer questions, poor eye contact         Assessment & Plan:     ICD-10-CM   1. Encounter for biometric screening  Z00.8 CBC with Differential/Platelet    Hemoglobin A1c    Lipid panel    Comprehensive metabolic panel   forms initiated and will be completed with lab results    2. Attention deficit hyperactivity disorder (ADHD), combined type  F90.2 amphetamine-dextroamphetamine (ADDERALL XR) 20 MG 24 hr capsule    amphetamine-dextroamphetamine  (ADDERALL XR) 20 MG 24 hr capsule    amphetamine-dextroamphetamine (ADDERALL XR) 20 MG 24 hr capsule   refill given on prior meds, she has been out for a few months, she wants different dose and form of meds, must f/up with PCP to discuss previously denies by pcp  3. Need for shingles vaccine  Z23 Zoster Vaccine Adjuvanted [facility]) injection   sent to pharmacy - pt declined getting here    4. Gastroesophageal reflux disease without esophagitis  K21.9 omeprazole (PRILOSEC) 20 MG capsule   stable GERD with daily PPI, has consulted with GI, hiatal hernia, long term PPI SE reviewed    5. Major depression, recurrent, chronic (HCC)  F33.9 sertraline (ZOLOFT) 50 MG tablet   off meds for months, sig hx and family hx, refilled meds, pt to restart with 1/2 dose for 1-2 weeks then resume 50 mg daily dose    6. Hyperlipidemia, unspecified hyperlipidemia type  E78.5 Lipid panel    Comprehensive metabolic panel   labs for insurance biometric eval, mild LDL elevated with low ASCVD risk score, discussed with pt, educated    7. Hyperglycemia  R73.9 Hemoglobin A1c    Comprehensive metabolic panel   labs for insurance biometric eval    8. 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 Jacinto Reap and cancer center, last mammogram Dec 2022     Pt advised to get and keep 3 month f/up with Shelby Barry - if she continues to no-show, reschedule or just not follow medical advise and instructions she'll be at risk of discharge from practice or her meds  will not be refilled  Explained that I would not adjust her controlled substances to what she requests, but am able to refill what she has previously been on with her PCP - after reviewing controlled substance database - she has concerns and requests for both ambien and adderall - encouraged her to get appt with PCP to discuss  Shelby Grana, PA-C 07/08/22 10:08 AM

## 2022-07-09 ENCOUNTER — Encounter: Payer: Self-pay | Admitting: Internal Medicine

## 2022-07-09 LAB — CBC WITH DIFFERENTIAL/PLATELET
Basophils Absolute: 0 10*3/uL (ref 0.0–0.2)
Basos: 1 %
EOS (ABSOLUTE): 0.2 10*3/uL (ref 0.0–0.4)
Eos: 2 %
Hematocrit: 40.5 % (ref 34.0–46.6)
Hemoglobin: 12.8 g/dL (ref 11.1–15.9)
Immature Grans (Abs): 0 10*3/uL (ref 0.0–0.1)
Immature Granulocytes: 0 %
Lymphocytes Absolute: 1.9 10*3/uL (ref 0.7–3.1)
Lymphs: 28 %
MCH: 25.4 pg — ABNORMAL LOW (ref 26.6–33.0)
MCHC: 31.6 g/dL (ref 31.5–35.7)
MCV: 80 fL (ref 79–97)
Monocytes Absolute: 0.4 10*3/uL (ref 0.1–0.9)
Monocytes: 6 %
Neutrophils Absolute: 4.3 10*3/uL (ref 1.4–7.0)
Neutrophils: 63 %
Platelets: 345 10*3/uL (ref 150–450)
RBC: 5.04 x10E6/uL (ref 3.77–5.28)
RDW: 14.2 % (ref 11.7–15.4)
WBC: 6.8 10*3/uL (ref 3.4–10.8)

## 2022-07-09 LAB — COMPREHENSIVE METABOLIC PANEL
ALT: 19 IU/L (ref 0–32)
AST: 18 IU/L (ref 0–40)
Albumin/Globulin Ratio: 2.6 — ABNORMAL HIGH (ref 1.2–2.2)
Albumin: 4.5 g/dL (ref 3.8–4.9)
Alkaline Phosphatase: 106 IU/L (ref 44–121)
BUN/Creatinine Ratio: 26 — ABNORMAL HIGH (ref 9–23)
BUN: 17 mg/dL (ref 6–24)
Bilirubin Total: 0.3 mg/dL (ref 0.0–1.2)
CO2: 22 mmol/L (ref 20–29)
Calcium: 9.4 mg/dL (ref 8.7–10.2)
Chloride: 103 mmol/L (ref 96–106)
Creatinine, Ser: 0.65 mg/dL (ref 0.57–1.00)
Globulin, Total: 1.7 g/dL (ref 1.5–4.5)
Glucose: 93 mg/dL (ref 70–99)
Potassium: 4.5 mmol/L (ref 3.5–5.2)
Sodium: 141 mmol/L (ref 134–144)
Total Protein: 6.2 g/dL (ref 6.0–8.5)
eGFR: 103 mL/min/{1.73_m2} (ref >59)

## 2022-07-09 LAB — LIPID PANEL
Chol/HDL Ratio: 2.8 ratio (ref 0.0–4.4)
Cholesterol, Total: 202 mg/dL — ABNORMAL HIGH (ref 100–199)
HDL: 71 mg/dL (ref >39)
LDL Chol Calc (NIH): 115 mg/dL — ABNORMAL HIGH (ref 0–99)
Triglycerides: 89 mg/dL (ref 0–149)
VLDL Cholesterol Cal: 16 mg/dL (ref 5–40)

## 2022-07-09 LAB — HEMOGLOBIN A1C
Est. average glucose Bld gHb Est-mCnc: 111 mg/dL
Hgb A1c MFr Bld: 5.5 % (ref 4.8–5.6)

## 2022-08-31 ENCOUNTER — Inpatient Hospital Stay: Payer: Managed Care, Other (non HMO) | Admitting: Internal Medicine

## 2022-09-01 ENCOUNTER — Encounter: Payer: Self-pay | Admitting: Internal Medicine

## 2022-09-01 ENCOUNTER — Inpatient Hospital Stay: Payer: Managed Care, Other (non HMO) | Attending: Internal Medicine | Admitting: Internal Medicine

## 2022-09-01 DIAGNOSIS — Z170 Estrogen receptor positive status [ER+]: Secondary | ICD-10-CM | POA: Diagnosis not present

## 2022-09-01 DIAGNOSIS — Z08 Encounter for follow-up examination after completed treatment for malignant neoplasm: Secondary | ICD-10-CM | POA: Insufficient documentation

## 2022-09-01 DIAGNOSIS — M8580 Other specified disorders of bone density and structure, unspecified site: Secondary | ICD-10-CM | POA: Insufficient documentation

## 2022-09-01 DIAGNOSIS — C50511 Malignant neoplasm of lower-outer quadrant of right female breast: Secondary | ICD-10-CM | POA: Diagnosis not present

## 2022-09-01 DIAGNOSIS — Z853 Personal history of malignant neoplasm of breast: Secondary | ICD-10-CM | POA: Insufficient documentation

## 2022-09-01 NOTE — Assessment & Plan Note (Addendum)
#  Breast cancer-stage II ER/PR positive HER-2/neu negative; declines Enodcrine therapy sec to intolerance. STABLE. DEC Mammogram 2022-WNL.  No evidence of any recurrence.  Order mammogram December 2023.  # Continues to be in the ABC trial-finished /; Be well- estyle changes. Discussed with clinical trials nurse  # right upper abdominal pain/ Nausea-voimiting-CT scan February 2023 negative.  No concern for any acute abdomen.  S/p GI evaluation.  If not improved recommend reaching out to GI for further work-up.  Clinically suspect anxiety driven.  Recommend evaluation with PCP/psychiatry.  See below  #Osteopenia T score -1.9 [dec 2022]- STABLE; continue calcium plus vitamin;  # Fatigue/Nausea-vomiting- ? Anxiety related vs organic cause-- Unclear etiology- AUG 2023- CBC/CMP- WNL. Discussed re: psyche referral. Defer to PCP.see above.   # DISPOSITION: # Bil mammogram dec 2023-  # follow up in 6 months-MD; cbc/cmp; ca 27-29-- Dr.B

## 2022-09-01 NOTE — Research (Signed)
BWEL, Month 60 Visit  09/01/22 0845 AM Patient Shelby Barry presents today unaccompanied for her 17 Month visit on the [place] study.  Consent Addendum - Patient states she has not read the consent addendum and is not sure about participating in the voluntary lab part of the study right now. Patient assured we will review the consent addendum again at her next 72 month visit, as the protocol allows it to be completed at the 5 or 6 year visit.    Questionnaires - Patient was provided with study questionnaires and completed these before seeing the provider today.  This RN reviewed for completeness.  Adverse Events - AEs and Solicited AEs are no longer collected at this point in the patient's trial participation.  Weight - This RN collected the patient's weight two times, in light clothing and without shoes.  Both measurements were 78.9kg.  Waist and Hip Circumference -  Waist and Hip circumference were measured twice by this RN, per protocol.  Circumferences listed below: Waist - 40 in. /  39 in. Hip - 43 in. / 41 in.  H&P - Patient is seen by Dr. Rogue Bussing today for complaints of fatigue and emesis after meals at her request. Vital signs are documented in office encounter notes for this visit. Patient endorses a lot of anxiety with multiple life stressors occurring in the past year. Patient was encouraged to visit with her PCP for anxiety related symptoms listed above.   Visit Planning - Patient is not due for another BWEL visit until next year.  This RN thanked the patient for her ongoing participation in the [place] study.  Patient denied any questions or concerns related to trial participation.  [name], RN  09/01/2022 10:36 AM

## 2022-09-01 NOTE — Progress Notes (Signed)
[facility] OFFICE PROGRESS NOTE  Patient Care Team: Steele Sizer, MD as PCP - General (Family Medicine) Christene Lye, MD (General Surgery) Steele Sizer, MD as Attending Physician (Family Medicine) Rico Junker, RN as Oncology Nurse Navigator Cammie Sickle, MD as Consulting Physician (Internal Medicine)   Cancer Staging  No matching staging information was found for the patient.   Oncology History Overview Note  # DEC 2017- RIGHT BREAST Stage II [pT1cpN1sn; screening] ER/PR- Pos- 90%; her 2 NEU- NEG s/p Lumpect & SLNBx; Dr.Sankar ]; JAN 2018- ddAC -T x12. AUG 2nd [finished RT].  # AUG 21st 2018- Start TAM; Nov 2018- Stopped sec intol/mood swings; arthralgias];  DEC 2019- Arimidex 1 mg/day; STOPPED JAN 2020 sec to intolerarnce.   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  Appears anxious.  Accompanied by clinical trials nurse.  Shelby Barry 56 y.o.  female pleasant patient above history of stage II breast cancer ER PR positive currently NOT on adjuvant endocrine therapy [sec to intolerance] is in for follow-up/ ABC-be well clinical studies.   Pt request appointment for follow up, reports has been "very tired and was thinking I need some blood work done".   Patient complains of anxiety. Complains of abdominal pain right upper quadrant 2-3 times a week x 1-2 months. No aggravating or releiving factors; 3-4/10; nausea 5 times a week.  Also episodes of vomiting.  Continues to have chronic fatigue.  Not any worse.  Denies any worsening joint pains.  No new  shortness of breath or cough.  No fever and chills.  Review of Systems  Constitutional:  Positive for malaise/fatigue. Negative for chills, diaphoresis, fever and weight loss.  HENT:  Negative for nosebleeds and sore throat.   Eyes:  Negative for double vision.  Respiratory:  Negative for hemoptysis and sputum production.   Cardiovascular:  Negative for chest pain, palpitations, orthopnea and leg swelling.  Gastrointestinal:  Positive for abdominal pain, nausea and vomiting. Negative for blood in stool, constipation, diarrhea, heartburn and melena.  Genitourinary:  Negative for dysuria, frequency and urgency.  Musculoskeletal:  Negative for back pain.  Skin: Negative.  Negative for itching and rash.  Neurological:  Negative for dizziness, tingling, focal weakness, weakness and headaches.  Endo/Heme/Allergies:  Does not bruise/bleed easily.      PAST MEDICAL HISTORY :  Past Medical History:  Diagnosis Date   Anemia    Anxiety    Breast cancer ([place]) 09/2016   right breast   Bursitis of right shoulder    Cancer (H[facility])    breast   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, estrogen receptor positive ([place])    Cataract, bilateral    Chemotherapy induced nausea and vomiting    Cold    states she has had it for 2 months   Depression    GERD (gastroesophageal reflux disease)    uses baking soda for symptoms   Insomnia    Lumbago    Personal history of chemotherapy 11/22/2016   Personal history of radiation therapy 01/20/2017   Vitamin D deficiency  PAST SURGICAL HISTORY :   Past Surgical History:  Procedure Laterality Date   ABDOMINAL HYSTERECTOMY  04/20/2014   BILATERAL SALPINGECTOMY     BREAST BIOPSY Right 09/27/2016   positive   BREAST LUMPECTOMY Right 10/12/2016   positive   BREAST LUMPECTOMY WITH SENTINEL LYMPH NODE BIOPSY Right 10/12/2016   Procedure: BREAST LUMPECTOMY WITH SENTINEL LYMPH NODE BX;  Surgeon: Christene Lye, MD;  Location:  [facility];  Service: General;  Laterality: Right;   COLONOSCOPY WITH PROPOFOL N/A 05/20/2016   Procedure: COLONOSCOPY WITH PROPOFOL;  Surgeon: Lucilla Lame, MD;  Location: [place];  Service: Endoscopy;  Laterality: N/A;   ECTOPIC PREGNANCY SURGERY     ENDOMETRIAL ABLATION     PORTACATH PLACEMENT Left 11/04/2016   Procedure: INSERTION PORT-A-CATH;  Surgeon: Christene Lye, MD;  Location: [facility];  Service: General;  Laterality: Left;  Left subclavian vein   TUBAL LIGATION      FAMILY HISTORY :   Family History  Problem Relation Age of Onset   Heart disease Mother    Depression Father    Obesity Father    Diabetes Father    Alcohol abuse Father    COPD Father    Alcohol abuse Brother    Seizures Brother    Breast cancer Sister 42    SOCIAL HISTORY:   Social History   Tobacco Use   Smoking status: Former    Packs/day: 0.50    Years: 20.00    Total pack years: 10.00    Types: Cigarettes    Quit date: 08/23/2016    Years since quitting: 6.0   Smokeless tobacco: Never  Vaping Use   Vaping Use: Never used  Substance Use Topics   Alcohol use: Yes    Alcohol/week: 0.0 standard drinks of alcohol    Comment: occassional   Drug use: No    ALLERGIES:  is allergic to adhesive [tape] and nickel.  MEDICATIONS:  Current Outpatient Medications  Medication Sig Dispense Refill   acetaminophen (TYLENOL) 325 MG tablet Take 325 mg by mouth every 6 (six) hours as needed for fever.     albuterol (VENTOLIN HFA) 108 (90 Base) MCG/ACT inhaler Inhale 1-2 puffs into the lungs every 6 (six) hours as needed for wheezing or shortness of breath. 1 g 0   amphetamine-dextroamphetamine (ADDERALL XR) 20 MG 24 hr capsule Take 1 capsule (20 mg total) by mouth daily. 30 capsule 0   meloxicam (MOBIC) 15 MG tablet Take 1 tablet (15 mg total) by mouth daily. 90 tablet 0   zolpidem (AMBIEN) 5 MG tablet Take 1 tablet (5 mg total) by mouth at bedtime as needed for sleep. 90 tablet 0   [START  ON 09/06/2022] amphetamine-dextroamphetamine (ADDERALL XR) 20 MG 24 hr capsule Take 1 capsule (20 mg total) by mouth daily. (Patient not taking: Reported on 09/01/2022) 30 capsule 0   amphetamine-dextroamphetamine (ADDERALL XR) 20 MG 24 hr capsule Take 1 capsule (20 mg total) by mouth daily. 30 capsule 0   dicyclomine (BENTYL) 10 MG capsule Take 1 capsule (10 mg total) by mouth 3 (three) times daily as needed for spasms. (Patient not taking: Reported on 07/08/2022) 30 capsule 0   loratadine (CLARITIN) 10 MG tablet Take 1 tablet (10 mg total) by mouth daily. (Patient not taking: Reported on 07/08/2022) 30 tablet 2   omeprazole (PRILOSEC) 20 MG capsule Take 1 capsule (20 mg total) by mouth daily. 90 capsule 3   sertraline (ZOLOFT) 50 MG tablet Take 1  tablet (50 mg total) by mouth daily. (Patient not taking: Reported on 09/01/2022) 90 tablet 1   No current facility-administered medications for this visit.    PHYSICAL EXAMINATION: ECOG PERFORMANCE STATUS: 0 - Asymptomatic  BP 115/76 (BP Location: Left Arm, Patient Position: Sitting)   Pulse (!) 108   Temp (!) 96.3 F (35.7 C) (Tympanic)   Resp 16   Wt 174 lb (78.9 kg)   SpO2 96%   BMI 32.34 kg/m   Filed Weights   09/01/22 0849  Weight: 174 lb (78.9 kg)    Physical Exam HENT:     Head: Normocephalic and atraumatic.     Mouth/Throat:     Pharynx: No oropharyngeal exudate.  Eyes:     Pupils: Pupils are equal, round, and reactive to light.  Cardiovascular:     Rate and Rhythm: Normal rate and regular rhythm.  Pulmonary:     Effort: No respiratory distress.     Breath sounds: No wheezing.  Abdominal:     General: Bowel sounds are normal. There is no distension.     Palpations: Abdomen is soft. There is no mass.     Tenderness: There is abdominal tenderness. There is no guarding or rebound.     Comments: Mild tenderness in the right upper quadrant.  Musculoskeletal:        General: No tenderness. Normal range of motion.      Cervical back: Normal range of motion and neck supple.  Skin:    General: Skin is warm.     Comments:    Neurological:     Mental Status: She is alert and oriented to person, place, and time.  Psychiatric:        Mood and Affect: Affect normal.     LABORATORY DATA:  I have reviewed the data as listed    Component Value Date/Time   NA 141 07/08/2022 1118   K 4.5 07/08/2022 1118   CL 103 07/08/2022 1118   CO2 22 07/08/2022 1118   GLUCOSE 93 07/08/2022 1118   GLUCOSE 88 11/20/2021 1346   BUN 17 07/08/2022 1118   CREATININE 0.65 07/08/2022 1118   CREATININE 0.74 11/20/2021 1346   CALCIUM 9.4 07/08/2022 1118   PROT 6.2 07/08/2022 1118   ALBUMIN 4.5 07/08/2022 1118   AST 18 07/08/2022 1118   ALT 19 07/08/2022 1118   ALKPHOS 106 07/08/2022 1118   BILITOT 0.3 07/08/2022 1118   GFRNONAA >60 04/22/2021 1410   GFRAA 108 02/22/2020 1331    No results found for: "SPEP", "UPEP"  Lab Results  Component Value Date   WBC 6.8 07/08/2022   NEUTROABS 4.3 07/08/2022   HGB 12.8 07/08/2022   HCT 40.5 07/08/2022   MCV 80 07/08/2022   PLT 345 07/08/2022      Chemistry      Component Value Date/Time   NA 141 07/08/2022 1118   K 4.5 07/08/2022 1118   CL 103 07/08/2022 1118   CO2 22 07/08/2022 1118   BUN 17 07/08/2022 1118   CREATININE 0.65 07/08/2022 1118   CREATININE 0.74 11/20/2021 1346      Component Value Date/Time   CALCIUM 9.4 07/08/2022 1118   ALKPHOS 106 07/08/2022 1118   AST 18 07/08/2022 1118   ALT 19 07/08/2022 1118   BILITOT 0.3 07/08/2022 1118       RADIOGRAPHIC STUDIES: I have personally reviewed the radiological images as listed and agreed with the findings in the report. No results found.   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 STABLE. DEC Mammogram 2022-WNL.  No evidence  of any recurrence.  Order mammogram December 2023.  # Continues to be in the ABC trial-finished /; Be well- estyle changes. Discussed with clinical trials nurse  # right upper abdominal pain/ Nausea-voimiting-CT scan February 2023 negative.  No concern for any acute abdomen.  S/p GI evaluation.  If not improved recommend reaching out to GI for further work-up.  Clinically suspect anxiety driven.  Recommend evaluation with PCP/psychiatry.  See below  #Osteopenia T score -1.9 [dec 2022]- STABLE; continue calcium plus vitamin;  # Fatigue/Nausea-vomiting- ? Anxiety related vs organic cause-- Unclear etiology- AUG 2023- CBC/CMP- WNL. Discussed re: psyche referral. Defer to PCP.see above.   # DISPOSITION: # Bil mammogram dec 2023-  # follow up in 6 months-MD; cbc/cmp; ca 27-29-- Dr.B   Orders Placed This Encounter  Procedures   MM 3D SCREEN BREAST BILATERAL    Standing Status:   Future    Standing Expiration Date:   09/02/2023    Order Specific Question:   Reason for Exam (SYMPTOM  OR DIAGNOSIS REQUIRED)    Answer:   history of breast cancer    Order Specific Question:   Is the patient pregnant?    Answer:   No    Order Specific Question:   Preferred imaging location?    Answer:   [facility]   CBC with Differential/Platelet    Standing Status:   Future    Standing Expiration Date:   09/01/2023   Comprehensive metabolic panel    Standing Status:   Future    Standing Expiration Date:   09/01/2023   Cancer antigen 27.29    Standing Status:   Future    Standing Expiration Date:   09/01/2023   All questions were answered. The patient knows to call the clinic with any problems, questions or concerns.      Cammie Sickle, MD 09/01/2022 9:22 AM

## 2022-09-01 NOTE — Progress Notes (Signed)
Pt request appointment for follow up, reports has been "very tired and was thinking I need some blood work done".  Research to follow up with pt today.

## 2022-10-07 ENCOUNTER — Other Ambulatory Visit: Payer: Self-pay | Admitting: Internal Medicine

## 2022-10-07 DIAGNOSIS — F5104 Psychophysiologic insomnia: Secondary | ICD-10-CM

## 2022-10-07 NOTE — Telephone Encounter (Signed)
Requested medications are due for refill today.  yes  Requested medications are on the active medications list.  yes  Last refill. 07/08/2022 #90 0 rf  Future visit scheduled.   no  Notes to clinic.  Refill not delegated.    Requested Prescriptions  Pending Prescriptions Disp Refills   zolpidem (AMBIEN) 5 MG tablet [Pharmacy Med Name: Zolpidem Tartrate 5 MG Oral Tablet] 30 tablet 0    Sig: TAKE 1 TABLET BY MOUTH AT BEDTIME AS NEEDED FOR SLEEP     Not Delegated - Psychiatry:  Anxiolytics/Hypnotics Failed - 10/07/2022  5:31 PM      Failed - This refill cannot be delegated      Failed - Urine Drug Screen completed in last 360 days      Passed - Valid encounter within last 6 months    Recent Outpatient Visits           3 months ago Encounter for biometric screening   [facility] Delsa Grana, PA-C   7 months ago Cyst of buttocks   [place], DO   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 Steele Sizer, MD   1 year ago COPD with asthma [facility])   [facility] Steele Sizer, MD   1 year ago COPD with asthma [facility])   [facility] Steele Sizer, MD

## 2022-10-08 ENCOUNTER — Other Ambulatory Visit: Payer: Self-pay | Admitting: Family Medicine

## 2022-10-08 DIAGNOSIS — F5104 Psychophysiologic insomnia: Secondary | ICD-10-CM

## 2022-10-08 NOTE — Telephone Encounter (Unsigned)
Copied from [place] [phone]. Topic: General - Other >> Oct 08, 2022  4:42 PM Everette C wrote: Reason for CRM: Medication Refill - Medication: zolpidem (AMBIEN) 5 MG tablet [[id number]]  Has the patient contacted their pharmacy? Yes.  The patient has been directed to contact their PCP  (Agent: If no, request that the patient contact the pharmacy for the refill. If patient does not wish to contact the pharmacy document the reason why and proceed with request.) (Agent: If yes, when and what did the pharmacy advise?)  Preferred Pharmacy (with phone number or street name): [place], [place] - [address] [address] Phone: [phone] Fax: [phone] Hours: Not open 24 hours   Has the patient been seen for an appointment in the last year OR does the patient have an upcoming appointment? Yes.    Agent: Please be advised that RX refills may take up to 3 business days. We ask that you follow-up with your pharmacy.

## 2022-10-11 ENCOUNTER — Other Ambulatory Visit: Payer: Self-pay

## 2022-10-11 DIAGNOSIS — F5104 Psychophysiologic insomnia: Secondary | ICD-10-CM

## 2022-10-11 NOTE — Telephone Encounter (Signed)
Requested medications are due for refill today.  unsure  Requested medications are on the active medications list.  yes  Last refill. 07/08/2022 #90 0 rf  Future visit scheduled.   no  Notes to clinic.  Refill not delegated.    Requested Prescriptions  Pending Prescriptions Disp Refills   zolpidem (AMBIEN) 5 MG tablet 90 tablet 0    Sig: Take 1 tablet (5 mg total) by mouth at bedtime as needed for sleep.     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

## 2022-10-18 ENCOUNTER — Ambulatory Visit
Admission: EM | Admit: 2022-10-18 | Discharge: 2022-10-18 | Disposition: A | Discharge status: Home / Self Care | Payer: Managed Care, Other (non HMO) | Attending: Urgent Care | Admitting: Urgent Care

## 2022-10-18 DIAGNOSIS — J019 Acute sinusitis, unspecified: Secondary | ICD-10-CM

## 2022-10-18 DIAGNOSIS — J209 Acute bronchitis, unspecified: Secondary | ICD-10-CM

## 2022-10-18 DIAGNOSIS — B9689 Other specified bacterial agents as the cause of diseases classified elsewhere: Secondary | ICD-10-CM

## 2022-10-18 DIAGNOSIS — H1031 Unspecified acute conjunctivitis, right eye: Secondary | ICD-10-CM | POA: Diagnosis not present

## 2022-10-18 MED ORDER — POLYMYXIN B-TRIMETHOPRIM 10000-0.1 UNIT/ML-% OP SOLN: 1.0000 [drp] | Freq: Four times a day (QID) | OPHTHALMIC | 0 refills | Status: AC

## 2022-10-18 MED ORDER — AZITHROMYCIN 250 MG PO TABS: ORAL_TABLET | ORAL | 0 refills | Status: DC

## 2022-10-18 MED ORDER — PREDNISONE 20 MG PO TABS: ORAL_TABLET | ORAL | 0 refills | Status: AC

## 2022-10-18 NOTE — ED Provider Notes (Signed)
[facility]    CSN: [id number] Arrival date & time: 10/18/22  1421      History   Chief Complaint Chief Complaint  Patient presents with   Headache   Nasal Congestion   Cough   Laryngitis    HPI Shelby Barry is a 56 y.o. female.    Headache Associated symptoms: cough   Cough Associated symptoms: headaches     Presents to urgent care with report of cough, loss of voice, headache, congestion, burning itching eyes.  Symptoms started 1 month ago.  Cough is unproductive dry hacking.  Right eye is red and has discharge throughout the day.  Denies fever, myalgias, chills.  Denies nausea, vomiting, diarrhea.  Past Medical History:  Diagnosis Date   Anemia    Anxiety    Breast cancer ([place]) 09/2016   right breast   Bursitis of right shoulder    Cancer ([place])    breast   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, estrogen receptor positive ([place])    Cataract, bilateral    Chemotherapy induced nausea and vomiting    Cold    states she has had it for 2 months   Depression    GERD (gastroesophageal reflux disease)    uses baking soda for symptoms   Insomnia    Lumbago    Personal history of chemotherapy 11/22/2016   Personal history of radiation therapy 01/20/2017   Vitamin D deficiency     Patient Active Problem List   Diagnosis Date Noted   Obesity (BMI 30.0-34.9) 05/18/2018   Liver lesion 11/09/2016   Chronic bronchitis ([place]) 10/29/2016   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, estrogen receptor positive ([place]) 10/26/2016   Recurrent major depression-severe ([place]) 08/22/2015   Bursitis of shoulder 08/22/2015   Bilateral cataracts 08/22/2015   Circadian rhythm sleep disorder, 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   Vitamin D deficiency 08/22/2015   LBP (low back pain) 08/22/2015   GAD (generalized anxiety disorder) 08/22/2015   History of anemia 08/17/2007   Insomnia 08/16/2007    Past Surgical History:   Procedure Laterality Date   ABDOMINAL HYSTERECTOMY  04/20/2014   BILATERAL SALPINGECTOMY     BREAST BIOPSY Right 09/27/2016   positive   BREAST LUMPECTOMY Right 10/12/2016   positive   BREAST LUMPECTOMY WITH SENTINEL LYMPH NODE BIOPSY Right 10/12/2016   Procedure: BREAST LUMPECTOMY WITH SENTINEL LYMPH NODE BX;  Surgeon: Christene Lye, MD;  Location: [facility];  Service: General;  Laterality: Right;   COLONOSCOPY WITH PROPOFOL N/A 05/20/2016   Procedure: COLONOSCOPY WITH PROPOFOL;  Surgeon: Lucilla Lame, MD;  Location: [place];  Service: Endoscopy;  Laterality: N/A;   ECTOPIC PREGNANCY SURGERY     ENDOMETRIAL ABLATION     PORTACATH PLACEMENT Left 11/04/2016   Procedure: INSERTION PORT-A-CATH;  Surgeon: Christene Lye, MD;  Location: [facility];  Service: General;  Laterality: Left;  Left subclavian vein   TUBAL LIGATION      OB History     Gravida  3   Para  2   Term  2   Preterm      AB  1   Living  2      SAB      IAB      Ectopic  1   Multiple  0   Live Births           Obstetric Comments  1st Menstrual Cycle:  13 1st Pregnancy:  18           Home Medications    Prior to Admission medications   Medication Sig Start Date End Date Taking? Authorizing Provider  acetaminophen (TYLENOL) 325 MG tablet Take 325 mg by mouth every 6 (six) hours as needed for fever.    [provider]  albuterol (VENTOLIN HFA) 108 (90 Base) MCG/ACT inhaler Inhale 1-2 puffs into the lungs every 6 (six) hours as needed for wheezing or shortness of breath. 10/17/21   Danton Clap, PA-C  amphetamine-dextroamphetamine (ADDERALL XR) 20 MG 24 hr capsule Take 1 capsule (20 mg total) by mouth daily. Patient not taking: Reported on 09/01/2022 09/06/22 10/06/22  Delsa Grana, PA-C  amphetamine-dextroamphetamine (ADDERALL XR) 20 MG 24 hr capsule Take 1 capsule (20 mg total) by mouth daily. 08/08/22 09/07/22  Delsa Grana, PA-C   amphetamine-dextroamphetamine (ADDERALL XR) 20 MG 24 hr capsule Take 1 capsule (20 mg total) by mouth daily. 07/08/22 08/07/22  Delsa Grana, PA-C  dicyclomine (BENTYL) 10 MG capsule Take 1 capsule (10 mg total) by mouth 3 (three) times daily as needed for spasms. Patient not taking: Reported on 07/08/2022 12/29/21   Jonathon Bellows, MD  loratadine (CLARITIN) 10 MG tablet Take 1 tablet (10 mg total) by mouth daily. Patient not taking: Reported on 07/08/2022 02/27/20   Steele Sizer, MD  meloxicam (MOBIC) 15 MG tablet Take 1 tablet (15 mg total) by mouth daily. 02/24/22   Teodora Medici, DO  omeprazole (PRILOSEC) 20 MG capsule Take 1 capsule (20 mg total) by mouth daily. 07/08/22   Delsa Grana, PA-C  sertraline (ZOLOFT) 50 MG tablet Take 1 tablet (50 mg total) by mouth daily. Patient not taking: Reported on 09/01/2022 07/08/22   Delsa Grana, PA-C  zolpidem (AMBIEN) 5 MG tablet Take 1 tablet (5 mg total) by mouth at bedtime as needed for sleep. 07/08/22   Delsa Grana, PA-C    Family History Family History  Problem Relation Age of Onset   Heart disease Mother    Depression Father    Obesity Father    Diabetes Father    Alcohol abuse Father    COPD Father    Alcohol abuse Brother    Seizures Brother    Breast cancer Sister 70    Social History Social History   Tobacco Use   Smoking status: Former    Packs/day: 0.50    Years: 20.00    Total pack years: 10.00    Types: Cigarettes    Quit date: 08/23/2016    Years since quitting: 6.1   Smokeless tobacco: Never  Vaping Use   Vaping Use: Never used  Substance Use Topics   Alcohol use: Yes    Alcohol/week: 0.0 standard drinks of alcohol    Comment: occassional   Drug use: No     Allergies   Adhesive [tape] and Nickel   Review of Systems Review of Systems  Respiratory:  Positive for cough.   Neurological:  Positive for headaches.     Physical Exam Triage Vital Signs ED Triage Vitals  Enc Vitals Group     BP 10/18/22 1530  139/80     Pulse Rate 10/18/22 1530 96     Resp --      Temp 10/18/22 1530 98 F (36.7 C)     Temp Source 10/18/22 1530 Oral     SpO2 10/18/22 1530 97 %     Weight 10/18/22 1528 173 lb 15.1 oz (78.9 kg)     Height  10/18/22 1528 5' 1.5" (1.562 m)     Head Circumference --      Peak Flow --      Pain Score 10/18/22 1528 3     Pain Loc --      Pain Edu? --      Excl. in [place]? --    No data found.  Updated Vital Signs BP 139/80 (BP Location: Left Arm)   Pulse 96   Temp 98 F (36.7 C) (Oral)   Ht 5' 1.5" (1.562 m)   Wt 173 lb 15.1 oz (78.9 kg)   SpO2 97%   BMI 32.33 kg/m   Visual Acuity Right Eye Distance:   Left Eye Distance:   Bilateral Distance:    Right Eye Near:   Left Eye Near:    Bilateral Near:     Physical Exam Vitals reviewed.  Constitutional:      Appearance: She is well-developed.  HENT:     Mouth/Throat:     Pharynx: No oropharyngeal exudate or posterior oropharyngeal erythema.     Tonsils: No tonsillar exudate.  Cardiovascular:     Rate and Rhythm: Normal rate and regular rhythm.     Heart sounds: Normal heart sounds.  Pulmonary:     Effort: Pulmonary effort is normal.     Breath sounds: Normal breath sounds.  Abdominal:     General: Bowel sounds are normal.     Palpations: Abdomen is soft.  Neurological:     Mental Status: She is alert and oriented to person, place, and time.  Psychiatric:        Mood and Affect: Mood normal.        Speech: Speech normal.        Behavior: Behavior normal.      UC Treatments / Results  Labs (all labs ordered are listed, but only abnormal results are displayed) Labs Reviewed - No data to display  EKG   Radiology No results found.  Procedures Procedures (including critical care time)  Medications Ordered in UC Medications - No data to display  Initial Impression / Assessment and Plan / UC Course  I have reviewed the triage vital signs and the nursing notes.  Pertinent labs & imaging results  that were available during my care of the patient were reviewed by me and considered in my medical decision making (see chart for details).   Patient is afebrile here without recent antipyretics. Satting well on room air. Overall is ill appearing, though well hydrated and without respiratory distress. Pulmonary exam is unremarkable.  Lungs CTAB without wheezing.  Moderate cough, dry, frequent is present.  Given duration of symptoms, will cover for possibility of secondary bacterial infection with azithromycin.  Also recommending use of corticosteroid to treat bronchitis.  Advised patient to start this medication tomorrow morning to avoid sleep disturbance.  Final Clinical Impressions(s) / UC Diagnoses   Final diagnoses:  None   Discharge Instructions   None    ED Prescriptions   None    PDMP not reviewed this encounter.   Rose Phi, [place] 10/18/22 1604

## 2022-10-18 NOTE — Discharge Instructions (Addendum)
Follow up here or with your primary care provider if your symptoms are worsening or not improving with treatment.     

## 2022-10-18 NOTE — ED Triage Notes (Signed)
Patient reports cough, loss of voice,headache, congestion, eyes itching/burning-- started about 1 month ago.

## 2022-10-25 NOTE — Progress Notes (Deleted)
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 She states medication helps her finishing what she starts that she was unable to do in the past .    Major depression chronic recurrent: she has a long history of depression over the years, her father committed suicide when she was in her 24's. She is renting her house, she stays in [place] with her son and the days she not working  and she stays with her sister in [place].when she is working   She placed her mother in the nursing home Cannot tolerate Lexapro, she was taking zoloft helped with symptoms but ran out medication and would like a refill.  She is on Ambien now and is able to fall and stay asleep , no side effects    History of breast cancer: still seeing hematologist, stopped anastrozole months ago, tried Tamoxifen for about 6 months but also stopped in 2019 on her own . She is up to date with mammogram, last mammogram was 12/22, she gets concerned when develops pain.    GERD: she has been taking Omeprazole daily, she states she has heartburn and epigastric pain if she skips one dose of medication. Stable.    Chronic bronchitis/asthma: she had a severe flare in Nov, went to urgent care, negative for flu and COVID-19. She was given prednisone and cough syrup. Currently doing well, no wheezing or cough. Denies SOB  RUQ pain and nausea: happening when she eats, oncologist ordered US that showed two small gallbladder polyps and she was referred to GI, however  appointment was re-schedule for Feb 2023. She has decrease portions to control the nausea. 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, estrogen receptor positive ([place]) 10/26/2016   Recurrent major depression-severe ([place]) 08/22/2015   Bursitis of shoulder 08/22/2015   Bilateral cataracts 08/22/2015   Circadian rhythm sleep disorder, 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   Vitamin D deficiency 08/22/2015   LBP (low back pain) 08/22/2015   GAD (generalized anxiety disorder) 08/22/2015   History of anemia 08/17/2007   Insomnia 08/16/2007    Past Surgical History:  Procedure Laterality Date   ABDOMINAL HYSTERECTOMY  04/20/2014   BILATERAL SALPINGECTOMY     BREAST BIOPSY Right 09/27/2016   positive   BREAST LUMPECTOMY Right 10/12/2016   positive   BREAST LUMPECTOMY WITH SENTINEL LYMPH NODE BIOPSY Right 10/12/2016   Procedure: BREAST LUMPECTOMY WITH SENTINEL LYMPH NODE BX;  Surgeon: Christene Lye, MD;  Location: [facility];  Service: General;  Laterality: Right;   COLONOSCOPY WITH PROPOFOL N/A 05/20/2016   Procedure: COLONOSCOPY WITH PROPOFOL;  Surgeon: Lucilla Lame, MD;  Location: [place];  Service: Endoscopy;  Laterality: N/A;   ECTOPIC PREGNANCY SURGERY  ENDOMETRIAL ABLATION     PORTACATH PLACEMENT Left 11/04/2016   Procedure: INSERTION PORT-A-CATH;  Surgeon: Christene Lye, MD;  Location: [facility];  Service: General;  Laterality: Left;  Left subclavian vein   TUBAL LIGATION      Family History  Problem Relation Age of Onset   Heart disease Mother    Depression Father    Obesity Father    Diabetes Father    Alcohol abuse Father    COPD Father    Alcohol abuse Brother    Seizures Brother    Breast cancer Sister 49    Social History   Tobacco Use   Smoking  status: Former    Packs/day: 0.50    Years: 20.00    Total pack years: 10.00    Types: Cigarettes    Quit date: 08/23/2016    Years since quitting: 6.1   Smokeless tobacco: Never  Substance Use Topics   Alcohol use: Yes    Alcohol/week: 0.0 standard drinks of alcohol    Comment: occassional     Current Outpatient Medications:    acetaminophen (TYLENOL) 325 MG tablet, Take 325 mg by mouth every 6 (six) hours as needed for fever., Disp: , Rfl:    albuterol (VENTOLIN HFA) 108 (90 Base) MCG/ACT inhaler, Inhale 1-2 puffs into the lungs every 6 (six) hours as needed for wheezing or shortness of breath., Disp: 1 g, Rfl: 0   amphetamine-dextroamphetamine (ADDERALL XR) 20 MG 24 hr capsule, Take 1 capsule (20 mg total) by mouth daily. (Patient not taking: Reported on 09/01/2022), Disp: 30 capsule, Rfl: 0   amphetamine-dextroamphetamine (ADDERALL XR) 20 MG 24 hr capsule, Take 1 capsule (20 mg total) by mouth daily., Disp: 30 capsule, Rfl: 0   amphetamine-dextroamphetamine (ADDERALL XR) 20 MG 24 hr capsule, Take 1 capsule (20 mg total) by mouth daily., Disp: 30 capsule, Rfl: 0   azithromycin (ZITHROMAX Z-PAK) 250 MG tablet, Take 2 tablets (500 mg) today, then 1 tablet (250 mg) for next 4 days., Disp: 6 tablet, Rfl: 0   dicyclomine (BENTYL) 10 MG capsule, Take 1 capsule (10 mg total) by mouth 3 (three) times daily as needed for spasms. (Patient not taking: Reported on 07/08/2022), Disp: 30 capsule, Rfl: 0   loratadine (CLARITIN) 10 MG tablet, Take 1 tablet (10 mg total) by mouth daily. (Patient not taking: Reported on 07/08/2022), Disp: 30 tablet, Rfl: 2   meloxicam (MOBIC) 15 MG tablet, Take 1 tablet (15 mg total) by mouth daily., Disp: 90 tablet, Rfl: 0   omeprazole (PRILOSEC) 20 MG capsule, Take 1 capsule (20 mg total) by mouth daily., Disp: 90 capsule, Rfl: 3   sertraline (ZOLOFT) 50 MG tablet, Take 1 tablet (50 mg total) by mouth daily. (Patient not taking: Reported on 09/01/2022), Disp: 90 tablet,  Rfl: 1   trimethoprim-polymyxin b (POLYTRIM) ophthalmic solution, Place 1 drop into the right eye every 6 (six) hours for 7 days., Disp: 10 mL, Rfl: 0   zolpidem (AMBIEN) 5 MG tablet, Take 1 tablet (5 mg total) by mouth at bedtime as needed for sleep., Disp: 90 tablet, Rfl: 0  Allergies  Allergen Reactions   Adhesive [Tape] Other (See Comments)    Blisters/rash  PLEASE USE PAPER TAPE ONLY   Nickel     PT CAN WEAR GOLD, STERLING SILVER WITH NO PROBLEM-PT HAS NEVER HAD ANY PROBLEMS IN THE OR    I personally reviewed active problem list, medication list, allergies, family history, social history, health maintenance with the patient/caregiver  today.   ROS  ***  Objective  There were no vitals filed for this visit.  There is no height or weight on file to calculate BMI.  Physical Exam ***  No results found for this or any previous visit (from the past 2160 hour(s)).   PHQ2/9:    07/08/2022    9:53 AM 02/24/2022   10:45 AM 11/20/2021    1:03 PM 06/16/2021    1:23 PM 03/17/2021    1:39 PM  Depression screen PHQ 2/9  Decreased Interest 0 0 0 0 1  Down, Depressed, Hopeless 0 0 0 0 0  PHQ - 2 Score 0 0 0 0 1  Altered sleeping 0 0 '3 3 3  '$ Tired, decreased energy 0 0 0 3 1  Change in appetite 0 0 0 0 0  Feeling bad or failure about yourself  0 0 0 0 0  Trouble concentrating 0 0 0 0 0  Moving slowly or fidgety/restless 0 0 0 0 0  Suicidal thoughts 0 0 0 0 0  PHQ-9 Score 0 0 '3 6 5  '$ Difficult doing work/chores Not difficult at all Not difficult at all       phq 9 is {gen pos [id number]}   Fall Risk:    07/08/2022    9:52 AM 02/24/2022   10:45 AM 11/20/2021    1:03 PM 06/16/2021    1:23 PM 03/17/2021    1:38 PM  Fall Risk   Falls in the past year? 0 1 0 0 0  Number falls in past yr: 0 1 0 0 0  Injury with Fall? 0 1 0 0 0  Risk for fall due to : No Fall Risks  No Fall Risks    Follow up Falls prevention discussed  Falls prevention discussed        Functional Status  Survey:      Assessment & Plan  *** There are no diagnoses linked to this encounter.

## 2022-10-26 ENCOUNTER — Ambulatory Visit: Payer: Managed Care, Other (non HMO) | Admitting: Family Medicine

## 2022-10-26 ENCOUNTER — Encounter: Payer: Self-pay | Admitting: Family Medicine

## 2022-10-26 VITALS — BP 132/82 | HR 94 | Resp 16 | Ht 61.0 in | Wt 174.0 lb

## 2022-10-26 DIAGNOSIS — Z853 Personal history of malignant neoplasm of breast: Secondary | ICD-10-CM

## 2022-10-26 DIAGNOSIS — J45901 Unspecified asthma with (acute) exacerbation: Secondary | ICD-10-CM

## 2022-10-26 DIAGNOSIS — F5104 Psychophysiologic insomnia: Secondary | ICD-10-CM | POA: Diagnosis not present

## 2022-10-26 DIAGNOSIS — F902 Attention-deficit hyperactivity disorder, combined type: Secondary | ICD-10-CM | POA: Diagnosis not present

## 2022-10-26 DIAGNOSIS — K219 Gastro-esophageal reflux disease without esophagitis: Secondary | ICD-10-CM | POA: Diagnosis not present

## 2022-10-26 DIAGNOSIS — F339 Major depressive disorder, recurrent, unspecified: Secondary | ICD-10-CM | POA: Diagnosis not present

## 2022-10-26 DIAGNOSIS — E559 Vitamin D deficiency, unspecified: Secondary | ICD-10-CM

## 2022-10-26 DIAGNOSIS — J441 Chronic obstructive pulmonary disease with (acute) exacerbation: Secondary | ICD-10-CM

## 2022-10-26 MED ORDER — AMPHETAMINE-DEXTROAMPHET ER 20 MG PO CP24: 20.0000 mg | ORAL_CAPSULE | Freq: Every day | ORAL | 0 refills | Status: DC

## 2022-10-26 MED ORDER — ZOLPIDEM TARTRATE 5 MG PO TABS: 5.0000 mg | ORAL_TABLET | Freq: Every evening | ORAL | 0 refills | Status: DC | PRN

## 2022-10-26 MED ORDER — BUSPIRONE HCL 5 MG PO TABS: 5.0000 mg | ORAL_TABLET | Freq: Every day | ORAL | 0 refills | Status: DC | PRN

## 2022-10-26 MED ORDER — TRELEGY ELLIPTA 100-62.5-25 MCG/ACT IN AEPB: 1.0000 | INHALATION_SPRAY | Freq: Every day | RESPIRATORY_TRACT | 2 refills | Status: DC

## 2022-10-26 NOTE — Progress Notes (Addendum)
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 She is doing well on medication, needs refills today   Major depression chronic recurrent: she has a long history of depression over the years, her father committed suicide when she was in her 67's. She is renting her house, she stays in [place] with her son and the days she not working  and she stays with her sister in [place].when she is working   She placed her mother in the nursing home Cannot tolerate Lexapro, she was taking zoloft helped with symptoms but ran out medication and states only wants to take prn medication. She has been stressed since her grown son moved back in with her. He is a Chief Executive Officer but has been behaving differently. Change in personality, hanging out with homeless, he has been giving everything away. He needs to be evaluate    History of breast cancer: still seeing hematologist, stopped anastrozole months ago, tried Tamoxifen for about 6 months but also stopped in 2019 on her own . She is up to date with mammogram, last mammogram was 12/22, she is up to date with oncologist    GERD: she has been taking Omeprazole daily, she states she has heartburn and epigastric pain if she skips one dose of medication. Stable.    Chronic bronchitis/asthma: she had a severe flare in Nov 22, and has been sick again for the past 5 weeks, initially body aches, chills and fever, after that a cough, she also had some symptoms of gastroenteritis. She had a negative  COVID test, over the past week she got worse with some eye drainage, a lot of cough and some post-nasal drainage. She went o Urgent care on 11/27 and was given prednisone and z-pack , she continue to have a cough that is mostly dry. , went to urgent care, negative for flu and COVID-19. She was given prednisone and cough s. She did not get flu shot and refuses covid booster.   RUQ pain and nausea: happening when she eats, oncologist ordered US that showed two small gallbladder polyps , she saw GI and was given reassurance. She continues to vomit but she states it started after breast cancer treatment but stable. Seems to be lactose intolerance . 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, estrogen receptor positive ([place]) 10/26/2016   Recurrent major depression-severe ([place]) 08/22/2015   Bursitis of shoulder 08/22/2015   Bilateral cataracts 08/22/2015   Circadian rhythm sleep disorder, 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   Vitamin D deficiency 08/22/2015   LBP (low back pain) 08/22/2015   GAD (generalized anxiety disorder) 08/22/2015   History of anemia 08/17/2007   Insomnia 08/16/2007    Past Surgical History:  Procedure Laterality Date  ABDOMINAL HYSTERECTOMY  04/20/2014   BILATERAL SALPINGECTOMY     BREAST BIOPSY Right 09/27/2016   positive   BREAST LUMPECTOMY Right 10/12/2016   positive   BREAST LUMPECTOMY WITH SENTINEL LYMPH NODE BIOPSY Right 10/12/2016   Procedure: BREAST LUMPECTOMY WITH SENTINEL LYMPH NODE BX;  Surgeon: Christene Lye, MD;  Location: [facility];  Service: General;  Laterality: Right;   COLONOSCOPY WITH PROPOFOL N/A 05/20/2016   Procedure: COLONOSCOPY WITH PROPOFOL;  Surgeon: Lucilla Lame, MD;  Location: [place];  Service: Endoscopy;  Laterality: N/A;   ECTOPIC  PREGNANCY SURGERY     ENDOMETRIAL ABLATION     PORTACATH PLACEMENT Left 11/04/2016   Procedure: INSERTION PORT-A-CATH;  Surgeon: Christene Lye, MD;  Location: [facility];  Service: General;  Laterality: Left;  Left subclavian vein   TUBAL LIGATION      Family History  Problem Relation Age of Onset   Heart disease Mother    Depression Father    Obesity Father    Diabetes Father    Alcohol abuse Father    COPD Father    Alcohol abuse Brother    Seizures Brother    Breast cancer Sister 106    Social History   Tobacco Use   Smoking status: Former    Packs/day: 0.50    Years: 20.00    Total pack years: 10.00    Types: Cigarettes    Quit date: 08/23/2016    Years since quitting: 6.1   Smokeless tobacco: Never  Substance Use Topics   Alcohol use: Yes    Alcohol/week: 0.0 standard drinks of alcohol    Comment: occassional     Current Outpatient Medications:    acetaminophen (TYLENOL) 325 MG tablet, Take 325 mg by mouth every 6 (six) hours as needed for fever., Disp: , Rfl:    albuterol (VENTOLIN HFA) 108 (90 Base) MCG/ACT inhaler, Inhale 1-2 puffs into the lungs every 6 (six) hours as needed for wheezing or shortness of breath., Disp: 1 g, Rfl: 0   dicyclomine (BENTYL) 10 MG capsule, Take 1 capsule (10 mg total) by mouth 3 (three) times daily as needed for spasms., Disp: 30 capsule, Rfl: 0   loratadine (CLARITIN) 10 MG tablet, Take 1 tablet (10 mg total) by mouth daily., Disp: 30 tablet, Rfl: 2   meloxicam (MOBIC) 15 MG tablet, Take 1 tablet (15 mg total) by mouth daily., Disp: 90 tablet, Rfl: 0   omeprazole (PRILOSEC) 20 MG capsule, Take 1 capsule (20 mg total) by mouth daily., Disp: 90 capsule, Rfl: 3   sertraline (ZOLOFT) 50 MG tablet, Take 1 tablet (50 mg total) by mouth daily., Disp: 90 tablet, Rfl: 1   zolpidem (AMBIEN) 5 MG tablet, Take 1 tablet (5 mg total) by mouth at bedtime as needed for sleep., Disp: 90 tablet, Rfl: 0   amphetamine-dextroamphetamine (ADDERALL  XR) 20 MG 24 hr capsule, Take 1 capsule (20 mg total) by mouth daily. (Patient not taking: Reported on 09/01/2022), Disp: 30 capsule, Rfl: 0   amphetamine-dextroamphetamine (ADDERALL XR) 20 MG 24 hr capsule, Take 1 capsule (20 mg total) by mouth daily., Disp: 30 capsule, Rfl: 0   amphetamine-dextroamphetamine (ADDERALL XR) 20 MG 24 hr capsule, Take 1 capsule (20 mg total) by mouth daily., Disp: 30 capsule, Rfl: 0  Allergies  Allergen Reactions   Adhesive [Tape] Other (See Comments)    Blisters/rash  PLEASE USE PAPER TAPE ONLY   Nickel     PT CAN WEAR GOLD, STERLING SILVER WITH  NO PROBLEM-PT HAS NEVER HAD ANY PROBLEMS IN THE OR    I personally reviewed active problem list, medication list, allergies, family history, social history, health maintenance with the patient/caregiver today.   ROS  Ten systems reviewed and is negative except as mentioned in HPI   Objective  Vitals:   10/26/22 1036  BP: 132/82  Pulse: 94  Resp: 16  SpO2: 94%  Weight: 174 lb (78.9 kg)  Height: '5\' 1"'$  (1.549 m)    Body mass index is 32.88 kg/m.  Physical Exam  Constitutional: Patient appears well-developed and well-nourished. Obese  No distress.  HEENT: head atraumatic, normocephalic, pupils equal and reactive to light, ears normal TM, neck supple Cardiovascular: Normal rate, regular rhythm and normal heart sounds.  No murmur heard. No BLE edema. Pulmonary/Chest: Effort normal, diffuse in and expiratory wheezing, no crackles. No respiratory distress. Abdominal: Soft.  There is no tenderness. Psychiatric: Patient has a normal mood and affect. behavior is normal. Judgment and thought content normal.   PHQ2/9:    10/26/2022   10:36 AM 07/08/2022    9:53 AM 02/24/2022   10:45 AM 11/20/2021    1:03 PM 06/16/2021    1:23 PM  Depression screen PHQ 2/9  Decreased Interest 0 0 0 0 0  Down, Depressed, Hopeless 0 0 0 0 0  PHQ - 2 Score 0 0 0 0 0  Altered sleeping 0 0 0 3 3  Tired, decreased energy 0 0 0  0 3  Change in appetite 0 0 0 0 0  Feeling bad or failure about yourself  0 0 0 0 0  Trouble concentrating 0 0 0 0 0  Moving slowly or fidgety/restless 0 0 0 0 0  Suicidal thoughts 0 0 0 0 0  PHQ-9 Score 0 0 0 3 6  Difficult doing work/chores  Not difficult at all Not difficult at all      phq 9 is negative   Fall Risk:    10/26/2022   10:35 AM 07/08/2022    9:52 AM 02/24/2022   10:45 AM 11/20/2021    1:03 PM 06/16/2021    1:23 PM  Fall Risk   Falls in the past year? 0 0 1 0 0  Number falls in past yr: 0 0 1 0 0  Injury with Fall? 0 0 1 0 0  Risk for fall due to : No Fall Risks No Fall Risks  No Fall Risks   Follow up Falls prevention discussed Falls prevention discussed  Falls prevention discussed       Functional Status Survey: Is the patient deaf or have difficulty hearing?: Yes Does the patient have difficulty seeing, even when wearing glasses/contacts?: No Does the patient have difficulty concentrating, remembering, or making decisions?: No Does the patient have difficulty walking or climbing stairs?: No Does the patient have difficulty dressing or bathing?: No Does the patient have difficulty doing errands alone such as visiting a doctor's office or shopping?: No    Assessment & Plan  1. Attention deficit hyperactivity disorder (ADHD), combined type  - amphetamine-dextroamphetamine (ADDERALL XR) 20 MG 24 hr capsule; Take 1 capsule (20 mg total) by mouth daily.  Dispense: 30 capsule; Refill: 0 - amphetamine-dextroamphetamine (ADDERALL XR) 20 MG 24 hr capsule; Take 1 capsule (20 mg total) by mouth daily.  Dispense: 30 capsule; Refill: 0 - amphetamine-dextroamphetamine (ADDERALL XR) 20 MG 24 hr capsule; Take 1 capsule (20 mg total) by mouth daily.  Dispense: 30 capsule; Refill: 0  2.  Psychophysiological insomnia  - zolpidem (AMBIEN) 5 MG tablet; Take 1 tablet (5 mg total) by mouth at bedtime as needed for sleep.  Dispense: 90 tablet; Refill: 0  3. Major depression,  recurrent, chronic (HCC)  - busPIRone (BUSPAR) 5 MG tablet; Take 1-2 tablets (5-10 mg total) by mouth daily as needed.  Dispense: 90 tablet; Refill: 0  4. GERD without esophagitis  Stable on medication   5. Vitamin D deficiency  Continue supplementation   6. History of right breast cancer  Up to date with follow up with oncologist   7. Asthma exacerbation with COPD (chronic obstructive pulmonary disease) ([place])  - Fluticasone-Umeclidin-Vilant (TRELEGY ELLIPTA) 100-62.5-25 MCG/ACT AEPB; Inhale 1 puff into the lungs daily.  Dispense: 1 each; Refill: 2

## 2022-11-18 ENCOUNTER — Ambulatory Visit
Admission: RE | Admit: 2022-11-18 | Discharge: 2022-11-18 | Disposition: A | Discharge status: Home / Self Care | Payer: Managed Care, Other (non HMO) | Source: Ambulatory Visit | Attending: Internal Medicine | Admitting: Internal Medicine

## 2022-11-18 DIAGNOSIS — Z1231 Encounter for screening mammogram for malignant neoplasm of breast: Secondary | ICD-10-CM | POA: Diagnosis not present

## 2022-11-18 DIAGNOSIS — C50511 Malignant neoplasm of lower-outer quadrant of right female breast: Secondary | ICD-10-CM | POA: Insufficient documentation

## 2022-11-18 DIAGNOSIS — Z170 Estrogen receptor positive status [ER+]: Secondary | ICD-10-CM | POA: Insufficient documentation

## 2022-11-23 ENCOUNTER — Other Ambulatory Visit: Payer: Self-pay

## 2022-11-23 DIAGNOSIS — F339 Major depressive disorder, recurrent, unspecified: Secondary | ICD-10-CM

## 2022-11-23 MED ORDER — BUSPIRONE HCL 5 MG PO TABS: 5.0000 mg | ORAL_TABLET | Freq: Every day | ORAL | 0 refills | Status: DC | PRN

## 2022-12-06 ENCOUNTER — Other Ambulatory Visit: Payer: Self-pay

## 2022-12-06 DIAGNOSIS — F339 Major depressive disorder, recurrent, unspecified: Secondary | ICD-10-CM

## 2022-12-06 MED ORDER — BUSPIRONE HCL 5 MG PO TABS: 5.0000 mg | ORAL_TABLET | Freq: Every day | ORAL | 0 refills | Status: DC | PRN

## 2022-12-07 ENCOUNTER — Other Ambulatory Visit: Payer: Self-pay

## 2022-12-07 DIAGNOSIS — F339 Major depressive disorder, recurrent, unspecified: Secondary | ICD-10-CM

## 2023-01-01 ENCOUNTER — Other Ambulatory Visit: Payer: Self-pay | Admitting: Family Medicine

## 2023-01-01 DIAGNOSIS — F339 Major depressive disorder, recurrent, unspecified: Secondary | ICD-10-CM

## 2023-01-03 ENCOUNTER — Other Ambulatory Visit: Payer: Self-pay

## 2023-01-25 ENCOUNTER — Other Ambulatory Visit: Payer: Self-pay | Admitting: Family Medicine

## 2023-01-25 DIAGNOSIS — F5104 Psychophysiologic insomnia: Secondary | ICD-10-CM

## 2023-01-25 DIAGNOSIS — F902 Attention-deficit hyperactivity disorder, combined type: Secondary | ICD-10-CM

## 2023-01-25 NOTE — Telephone Encounter (Signed)
Requested medication (s) are due for refill today: yes  Requested medication (s) are on the active medication list: yes  Last refill:  10/26/22  Future visit scheduled: yes  Notes to clinic:  Unable to refill per protocol, cannot delegate.      Requested Prescriptions  Pending Prescriptions Disp Refills   amphetamine-dextroamphetamine (ADDERALL XR) 20 MG 24 hr capsule 30 capsule 0    Sig: Take 1 capsule (20 mg total) by mouth daily.     Not Delegated - Psychiatry:  Stimulants/ADHD Failed - 01/25/2023  1:51 PM      Failed - This refill cannot be delegated      Failed - Urine Drug Screen completed in last 360 days      Passed - Last BP in normal range    BP Readings from Last 1 Encounters:  10/26/22 132/82         Passed - Last Heart Rate in normal range    Pulse Readings from Last 1 Encounters:  10/26/22 94         Passed - Valid encounter within last 6 months    Recent Outpatient Visits           3 months ago Major depression, recurrent, chronic ([place])   [facility] Steele Sizer, MD   6 months ago Encounter for biometric screening   [facility] Delsa Grana, [place]   11 months ago Cyst of buttocks   [place], DO   1 year ago COPD with asthma [facility])   [facility] Steele Sizer, MD   1 year ago COPD with asthma [facility])   [facility] Steele Sizer, MD       Future Appointments             In 1 week Steele Sizer, MD [facility]             zolpidem (AMBIEN) 5 MG tablet 90 tablet 0    Sig: Take 1 tablet (5 mg total) by mouth at bedtime as needed for sleep.     Not Delegated - Psychiatry:  Anxiolytics/Hypnotics Failed - 01/25/2023  1:51 PM      Failed - This refill cannot be delegated      Failed - Urine Drug Screen completed in last 360 days      Passed - Valid  encounter within last 6 months    Recent Outpatient Visits           3 months ago Major depression, recurrent, chronic ([place])   [facility] Steele Sizer, MD   6 months ago Encounter for biometric screening   [facility] Delsa Grana, [place]   11 months ago Cyst of buttocks   [place], DO   1 year ago COPD with asthma [facility])   [facility] Steele Sizer, MD   1 year ago COPD with asthma [facility])   [facility] Steele Sizer, MD       Future Appointments             In 1 week Steele Sizer, MD [facility], [facility]

## 2023-01-25 NOTE — Telephone Encounter (Signed)
Medication Refill - Medication:   Adderall 20 mg  Ambien 5 mg  as the patient contacted their pharmacy? Yes.  They said she was out of refills (Agent: If no, request that the patient contact the pharmacy for the refill. If patient does not wish to contact the pharmacy document the reason why and proceed with request.) (Agent: If yes, when and what did the pharmacy advise?)  Preferred Pharmacy (with phone number or street name): [facility] [place] Has the patient been seen for an appointment in the last year OR does the patient have an upcoming appointment? Yes.    Agent: Please be advised that RX refills may take up to 3 business days. We ask that you follow-up with your pharmacy.

## 2023-01-26 MED ORDER — AMPHETAMINE-DEXTROAMPHET ER 20 MG PO CP24: 20.0000 mg | ORAL_CAPSULE | Freq: Every day | ORAL | 0 refills | Status: DC

## 2023-01-26 MED ORDER — ZOLPIDEM TARTRATE 5 MG PO TABS: 5.0000 mg | ORAL_TABLET | Freq: Every evening | ORAL | 0 refills | Status: DC | PRN

## 2023-01-31 NOTE — Progress Notes (Unsigned)
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  She placed her mother in the nursing home Cannot tolerate Lexapro, she was taking zoloft helped with symptoms but ran out medication and states only wants to take prn medication. She was really stressed since her grown son moved back in with her and her sister in the  Spring of 2023 . He is a Chief Executive Officer but has been behaving differently. Change in personality, hanging out with homeless people, he was giving all his belongings away. He was not really working, he finally moved out of the house, he is currently working and living in [place] since Feb 2024 . She is less stressed now, she is living with her sister. She lost 9 lbs since last visit, she is not sure why    History of breast cancer: still seeing hematologist, stopped anastrozole months ago, tried Tamoxifen for about 6 months but also stopped in 2019 on her own . She is up to date with mammogram, last mammogram was 12/23, she is up to date with oncologist    GERD: she has been out of Omeprazole 20 mg and she states having heartburn and indigestion, explained rx is at [facility] , but we will send a new one now so it can be filled today    COPD with hyporesponsiveness also has a history of Asthma she was using Advair but stopped after an episode of thrush, currently no cough, wheezing or sob. She quit smoking. Advised to use Advair at least prn flares   RUQ pain and  nausea: happening when she eats, oncologist ordered US that showed two small gallbladder polyps , she saw GI and was given reassurance. 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, estrogen receptor positive ([place]) 10/26/2016   Recurrent major depression-severe ([place]) 08/22/2015   Bursitis of shoulder 08/22/2015   Bilateral cataracts 08/22/2015   Circadian rhythm sleep disorder, 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   Vitamin D deficiency 08/22/2015   LBP (low back pain) 08/22/2015   GAD (generalized anxiety disorder) 08/22/2015   History of anemia 08/17/2007   Insomnia 08/16/2007    Past Surgical History:  Procedure Laterality Date   ABDOMINAL HYSTERECTOMY  04/20/2014   BILATERAL SALPINGECTOMY     BREAST BIOPSY Right 09/27/2016   positive   BREAST LUMPECTOMY Right 10/12/2016   positive   BREAST LUMPECTOMY WITH SENTINEL LYMPH NODE BIOPSY Right 10/12/2016   Procedure: BREAST LUMPECTOMY WITH SENTINEL LYMPH NODE BX;  Surgeon: Christene Lye, MD;  Location: [facility];  Service: General;  Laterality: Right;  COLONOSCOPY WITH PROPOFOL N/A 05/20/2016   Procedure: COLONOSCOPY WITH PROPOFOL;  Surgeon: Lucilla Lame, MD;  Location: [place];  Service: Endoscopy;  Laterality: N/A;   ECTOPIC PREGNANCY SURGERY     ENDOMETRIAL ABLATION     PORTACATH PLACEMENT Left 11/04/2016   Procedure: INSERTION PORT-A-CATH;  Surgeon: Christene Lye, MD;  Location: [facility];  Service: General;  Laterality: Left;  Left subclavian vein   TUBAL LIGATION      Family History  Problem Relation Age of Onset   Heart disease Mother    Depression Father    Obesity Father    Diabetes Father     Alcohol abuse Father    COPD Father    Alcohol abuse Brother    Seizures Brother    Breast cancer Sister 41    Social History   Tobacco Use   Smoking status: Former    Packs/day: 0.50    Years: 20.00    Total pack years: 10.00    Types: Cigarettes    Quit date: 08/23/2016    Years since quitting: 6.4   Smokeless tobacco: Never  Substance Use Topics   Alcohol use: Yes    Alcohol/week: 0.0 standard drinks of alcohol    Comment: occassional     Current Outpatient Medications:    acetaminophen (TYLENOL) 325 MG tablet, Take 325 mg by mouth every 6 (six) hours as needed for fever., Disp: , Rfl:    albuterol (VENTOLIN HFA) 108 (90 Base) MCG/ACT inhaler, Inhale 1-2 puffs into the lungs every 6 (six) hours as needed for wheezing or shortness of breath., Disp: 1 g, Rfl: 0   busPIRone (BUSPAR) 5 MG tablet, Take 1-2 tablets (5-10 mg total) by mouth daily as needed., Disp: 90 tablet, Rfl: 0   dicyclomine (BENTYL) 10 MG capsule, Take 1 capsule (10 mg total) by mouth 3 (three) times daily as needed for spasms., Disp: 30 capsule, Rfl: 0   Fluticasone-Umeclidin-Vilant (TRELEGY ELLIPTA) 100-62.5-25 MCG/ACT AEPB, Inhale 1 puff into the lungs daily., Disp: 1 each, Rfl: 2   loratadine (CLARITIN) 10 MG tablet, Take 1 tablet (10 mg total) by mouth daily., Disp: 30 tablet, Rfl: 2   meloxicam (MOBIC) 15 MG tablet, Take 1 tablet (15 mg total) by mouth daily., Disp: 90 tablet, Rfl: 0   amphetamine-dextroamphetamine (ADDERALL XR) 20 MG 24 hr capsule, Take 1 capsule (20 mg total) by mouth daily., Disp: 30 capsule, Rfl: 0   amphetamine-dextroamphetamine (ADDERALL XR) 20 MG 24 hr capsule, Take 1 capsule (20 mg total) by mouth daily., Disp: 30 capsule, Rfl: 0   amphetamine-dextroamphetamine (ADDERALL XR) 20 MG 24 hr capsule, Take 1 capsule (20 mg total) by mouth daily., Disp: 30 capsule, Rfl: 0   omeprazole (PRILOSEC) 20 MG capsule, Take 1 capsule (20 mg total) by mouth daily., Disp: 90 capsule, Rfl: 1    zolpidem (AMBIEN) 5 MG tablet, Take 1 tablet (5 mg total) by mouth at bedtime as needed for sleep., Disp: 90 tablet, Rfl: 0  Allergies  Allergen Reactions   Adhesive [Tape] Other (See Comments)    Blisters/rash  PLEASE USE PAPER TAPE ONLY   Nickel     PT CAN WEAR GOLD, STERLING SILVER WITH NO PROBLEM-PT HAS NEVER HAD ANY PROBLEMS IN THE OR    I personally reviewed active problem list, medication list, allergies, family history, social history, health maintenance with the patient/caregiver today.   ROS  Constitutional: Negative for fever , positive for weight change -lost 9 lbs in the past  3 months  - but last year it was 163  Respiratory: Negative for cough and shortness of breath.   Cardiovascular: Negative for chest pain or palpitations.  Gastrointestinal: Negative for abdominal pain, no bowel changes.  Musculoskeletal: Negative for gait problem or joint swelling.  Skin: Negative for rash.  Neurological: Negative for dizziness or headache.  No other specific complaints in a complete review of systems (except as listed in HPI above).   Objective  Vitals:   02/01/23 1007  BP: 114/78  Pulse: 99  Resp: 16  Temp: 97.7 F (36.5 C)  TempSrc: Oral  SpO2: 96%  Weight: 165 lb 12.8 oz (75.2 kg)  Height: 5' 1.5" (1.562 m)    Body mass index is 30.82 kg/m.  Physical Exam  Constitutional: Patient appears well-developed and well-nourished. Obese  No distress.  HEENT: head atraumatic, normocephalic, pupils equal and reactive to light, neck supple Cardiovascular: Normal rate, regular rhythm and normal heart sounds.  No murmur heard. No BLE edema. Pulmonary/Chest: Effort normal and breath sounds normal. No respiratory distress. Abdominal: Soft.  There is no tenderness. Psychiatric: Patient has a normal mood and affect. behavior is normal. Judgment and thought content normal.   PHQ2/9:    02/01/2023   10:08 AM 10/26/2022   10:36 AM 07/08/2022    9:53 AM 02/24/2022   10:45 AM  11/20/2021    1:03 PM  Depression screen PHQ 2/9  Decreased Interest 1 0 0 0 0  Down, Depressed, Hopeless 1 0 0 0 0  PHQ - 2 Score 2 0 0 0 0  Altered sleeping 1 0 0 0 3  Tired, decreased energy 1 0 0 0 0  Change in appetite 0 0 0 0 0  Feeling bad or failure about yourself  1 0 0 0 0  Trouble concentrating 1 0 0 0 0  Moving slowly or fidgety/restless 0 0 0 0 0  Suicidal thoughts 0 0 0 0 0  PHQ-9 Score 6 0 0 0 3  Difficult doing work/chores Not difficult at all  Not difficult at all Not difficult at all     phq 9 is negative   Fall Risk:    02/01/2023   10:02 AM 10/26/2022   10:35 AM 07/08/2022    9:52 AM 02/24/2022   10:45 AM 11/20/2021    1:03 PM  Fall Risk   Falls in the past year?  0 0 1 0  Number falls in past yr:  0 0 1 0  Injury with Fall?  0 0 1 0  Risk for fall due to : No Fall Risks No Fall Risks No Fall Risks  No Fall Risks  Follow up Falls prevention discussed Falls prevention discussed Falls prevention discussed  Falls prevention discussed     Assessment & Plan  1. Major depression, recurrent, chronic (HCC)  Stable   2. COPD with bronchial hyperresponsiveness (HCC)  Stopped Trelegy   3. Psychophysiological insomnia  - zolpidem (AMBIEN) 5 MG tablet; Take 1 tablet (5 mg total) by mouth at bedtime as needed for sleep.  Dispense: 90 tablet; Refill: 0  4. Attention deficit hyperactivity disorder (ADHD), combined type  - amphetamine-dextroamphetamine (ADDERALL XR) 20 MG 24 hr capsule; Take 1 capsule (20 mg total) by mouth daily.  Dispense: 30 capsule; Refill: 0 - amphetamine-dextroamphetamine (ADDERALL XR) 20 MG 24 hr capsule; Take 1 capsule (20 mg total) by mouth daily.  Dispense: 30 capsule; Refill: 0 - amphetamine-dextroamphetamine (ADDERALL XR) 20 MG 24 hr capsule; Take  1 capsule (20 mg total) by mouth daily.  Dispense: 30 capsule; Refill: 0  5. Vitamin D deficiency  Advised to resume vitamin D supplementation   6. Asthma, mild intermittent,  well-controlled  Stable  7. Attention deficit hyperactivity disorder (ADHD), combined type  - amphetamine-dextroamphetamine (ADDERALL XR) 20 MG 24 hr capsule; Take 1 capsule (20 mg total) by mouth daily.  Dispense: 30 capsule; Refill: 0 - amphetamine-dextroamphetamine (ADDERALL XR) 20 MG 24 hr capsule; Take 1 capsule (20 mg total) by mouth daily.  Dispense: 30 capsule; Refill: 0 - amphetamine-dextroamphetamine (ADDERALL XR) 20 MG 24 hr capsule; Take 1 capsule (20 mg total) by mouth daily.  Dispense: 30 capsule; Refill: 0  8. Gastroesophageal reflux disease without esophagitis  - omeprazole (PRILOSEC) 20 MG capsule; Take 1 capsule (20 mg total) by mouth daily.  Dispense: 90 capsule; Refill: 1

## 2023-02-01 ENCOUNTER — Ambulatory Visit: Payer: Managed Care, Other (non HMO) | Admitting: Family Medicine

## 2023-02-01 ENCOUNTER — Encounter: Payer: Self-pay | Admitting: Family Medicine

## 2023-02-01 VITALS — BP 114/78 | HR 99 | Temp 97.7°F | Resp 16 | Ht 61.5 in | Wt 165.8 lb

## 2023-02-01 DIAGNOSIS — J441 Chronic obstructive pulmonary disease with (acute) exacerbation: Secondary | ICD-10-CM

## 2023-02-01 DIAGNOSIS — F5104 Psychophysiologic insomnia: Secondary | ICD-10-CM | POA: Diagnosis not present

## 2023-02-01 DIAGNOSIS — J45909 Unspecified asthma, uncomplicated: Secondary | ICD-10-CM | POA: Insufficient documentation

## 2023-02-01 DIAGNOSIS — J4520 Mild intermittent asthma, uncomplicated: Secondary | ICD-10-CM

## 2023-02-01 DIAGNOSIS — K219 Gastro-esophageal reflux disease without esophagitis: Secondary | ICD-10-CM

## 2023-02-01 DIAGNOSIS — F339 Major depressive disorder, recurrent, unspecified: Secondary | ICD-10-CM | POA: Diagnosis not present

## 2023-02-01 DIAGNOSIS — F902 Attention-deficit hyperactivity disorder, combined type: Secondary | ICD-10-CM | POA: Diagnosis not present

## 2023-02-01 DIAGNOSIS — E559 Vitamin D deficiency, unspecified: Secondary | ICD-10-CM

## 2023-02-01 DIAGNOSIS — J4489 Other specified chronic obstructive pulmonary disease: Secondary | ICD-10-CM | POA: Insufficient documentation

## 2023-02-01 MED ORDER — OMEPRAZOLE 20 MG PO CPDR: 20.0000 mg | DELAYED_RELEASE_CAPSULE | Freq: Every day | ORAL | 1 refills | Status: DC

## 2023-02-01 MED ORDER — AMPHETAMINE-DEXTROAMPHET ER 20 MG PO CP24: 20.0000 mg | ORAL_CAPSULE | Freq: Every day | ORAL | 0 refills | Status: DC

## 2023-02-01 MED ORDER — ZOLPIDEM TARTRATE 5 MG PO TABS: 5.0000 mg | ORAL_TABLET | Freq: Every evening | ORAL | 0 refills | Status: DC | PRN

## 2023-03-03 ENCOUNTER — Inpatient Hospital Stay: Payer: Managed Care, Other (non HMO) | Admitting: Internal Medicine

## 2023-03-03 ENCOUNTER — Inpatient Hospital Stay: Payer: Managed Care, Other (non HMO)

## 2023-03-03 NOTE — Progress Notes (Deleted)
[facility] OFFICE PROGRESS NOTE  Patient Care Team: Alba CorySowles, [name], MD as PCP - General (Family Medicine) Kieth BrightlySankar, Seeplaputhur G, MD (General Surgery) Alba CorySowles, [name], MD as Attending Physician (Family Medicine) Jim LikeLambert, Sheena M, RN as Oncology Nurse Navigator Earna CoderBrahmanday, Saniah Schroeter R, MD as Consulting Physician (Internal Medicine)   Cancer Staging  No matching staging information was found for the patient.   Oncology History Overview Note  # DEC 2017- RIGHT BREAST Stage II [pT1cpN1sn; screening] ER/PR- Pos- 90%; her 2 NEU- NEG s/p Lumpect & SLNBx; Dr.Sankar ]; JAN 2018- ddAC -T x12. AUG 2nd [finished RT].  # AUG 21st 2018- Start TAM; Nov 2018- Stopped sec intol/mood swings; arthralgias];  DEC 2019- Arimidex 1 mg/day; STOPPED JAN 2020 sec to intolerarnce.   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  Appears anxious.  Accompanied by clinical trials nurse.  Shelby Barry 57 y.o.  female pleasant patient above history of stage II breast cancer ER PR positive currently NOT on adjuvant endocrine therapy [sec to intolerance] is in for follow-up/ ABC-be well clinical studies.   Pt request appointment for follow up, reports has been "very tired and was thinking I need some blood work done".   Patient complains of anxiety. Complains of abdominal pain right upper quadrant 2-3 times a week x 1-2 months. No aggravating or releiving factors; 3-4/10; nausea 5 times a week.  Also episodes of vomiting.  Continues to have chronic fatigue.  Not any worse.  Denies any worsening joint pains.  No new  shortness of breath or cough.  No fever and chills.  Review of Systems  Constitutional:  Positive for malaise/fatigue. Negative for chills, diaphoresis, fever and weight loss.  HENT:  Negative for nosebleeds and sore throat.   Eyes:  Negative for double vision.  Respiratory:  Negative for hemoptysis and sputum production.   Cardiovascular:  Negative for chest pain, palpitations, orthopnea and leg swelling.  Gastrointestinal:  Positive for abdominal pain, nausea and vomiting. Negative for blood in stool, constipation, diarrhea, heartburn and melena.  Genitourinary:  Negative for dysuria, frequency and urgency.  Musculoskeletal:  Negative for back pain.  Skin: Negative.  Negative for itching and rash.  Neurological:  Negative for dizziness, tingling, focal weakness, weakness and headaches.  Endo/Heme/Allergies:  Does not bruise/bleed easily.      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, estrogen receptor positive (HCC)    Cataract, bilateral    Chemotherapy induced nausea and vomiting    Cold    states she has had it for 2 months   Depression    GERD (gastroesophageal reflux disease)    uses baking soda for symptoms   Insomnia    Lumbago    Personal history of chemotherapy 11/22/2016   Personal history of radiation therapy 01/20/2017   Vitamin D deficiency  PAST SURGICAL HISTORY :   Past Surgical History:  Procedure Laterality Date   ABDOMINAL HYSTERECTOMY  04/20/2014   BILATERAL SALPINGECTOMY     BREAST BIOPSY Right 09/27/2016   positive   BREAST LUMPECTOMY Right 10/12/2016   positive   BREAST LUMPECTOMY WITH SENTINEL LYMPH NODE BIOPSY Right 10/12/2016   Procedure: BREAST LUMPECTOMY WITH SENTINEL LYMPH NODE BX;  Surgeon: Kieth Brightly, MD;  Location:  [facility];  Service: General;  Laterality: Right;   COLONOSCOPY WITH PROPOFOL N/A 05/20/2016   Procedure: COLONOSCOPY WITH PROPOFOL;  Surgeon: Midge Minium, MD;  Location: [facility];  Service: Endoscopy;  Laterality: N/A;   ECTOPIC PREGNANCY SURGERY     ENDOMETRIAL ABLATION     PORTACATH PLACEMENT Left 11/04/2016   Procedure: INSERTION PORT-A-CATH;  Surgeon: Kieth Brightly, MD;  Location: [facility];  Service: General;  Laterality: Left;  Left subclavian vein   TUBAL LIGATION      FAMILY HISTORY :   Family History  Problem Relation Age of Onset   Heart disease Mother    Depression Father    Obesity Father    Diabetes Father    Alcohol abuse Father    COPD Father    Alcohol abuse Brother    Seizures Brother    Breast cancer Sister 94    SOCIAL HISTORY:   Social History   Tobacco Use   Smoking status: Former    Packs/day: 0.50    Years: 20.00    Additional pack years: 0.00    Total pack years: 10.00    Types: Cigarettes    Quit date: 08/23/2016    Years since quitting: 6.5   Smokeless tobacco: Never  Vaping Use   Vaping Use: Never used  Substance Use Topics   Alcohol use: Yes    Alcohol/week: 0.0 standard drinks of alcohol    Comment: occassional   Drug use: No    ALLERGIES:  is allergic to adhesive [tape] and nickel.  MEDICATIONS:  Current Outpatient Medications  Medication Sig Dispense Refill   acetaminophen (TYLENOL) 325 MG tablet Take 325 mg by mouth every 6 (six) hours as needed for fever.     albuterol (VENTOLIN HFA) 108 (90 Base) MCG/ACT inhaler Inhale 1-2 puffs into the lungs every 6 (six) hours as needed for wheezing or shortness of breath. 1 g 0   amphetamine-dextroamphetamine (ADDERALL XR) 20 MG 24 hr capsule Take 1 capsule (20 mg total) by mouth daily. 30 capsule 0   amphetamine-dextroamphetamine (ADDERALL XR) 20 MG 24 hr capsule Take 1 capsule (20 mg total) by mouth daily. 30 capsule 0   amphetamine-dextroamphetamine (ADDERALL XR) 20 MG 24  hr capsule Take 1 capsule (20 mg total) by mouth daily. 30 capsule 0   busPIRone (BUSPAR) 5 MG tablet Take 1-2 tablets (5-10 mg total) by mouth daily as needed. 90 tablet 0   dicyclomine (BENTYL) 10 MG capsule Take 1 capsule (10 mg total) by mouth 3 (three) times daily as needed for spasms. 30 capsule 0   Fluticasone-Umeclidin-Vilant (TRELEGY ELLIPTA) 100-62.5-25 MCG/ACT AEPB Inhale 1 puff into the lungs daily. 1 each 2   loratadine (CLARITIN) 10 MG tablet Take 1 tablet (10 mg total) by mouth daily. 30 tablet 2   meloxicam (MOBIC) 15 MG tablet Take 1 tablet (15 mg total) by mouth daily. 90 tablet 0   omeprazole (PRILOSEC) 20 MG capsule Take 1 capsule (20 mg total) by mouth daily. 90 capsule 1   zolpidem (AMBIEN) 5 MG tablet Take  1 tablet (5 mg total) by mouth at bedtime as needed for sleep. 90 tablet 0   No current facility-administered medications for this visit.    PHYSICAL EXAMINATION: ECOG PERFORMANCE STATUS: 0 - Asymptomatic  There were no vitals taken for this visit.  There were no vitals filed for this visit.   Physical Exam HENT:     Head: Normocephalic and atraumatic.     Mouth/Throat:     Pharynx: No oropharyngeal exudate.  Eyes:     Pupils: Pupils are equal, round, and reactive to light.  Cardiovascular:     Rate and Rhythm: Normal rate and regular rhythm.  Pulmonary:     Effort: No respiratory distress.     Breath sounds: No wheezing.  Abdominal:     General: Bowel sounds are normal. There is no distension.     Palpations: Abdomen is soft. There is no mass.     Tenderness: There is abdominal tenderness. There is no guarding or rebound.     Comments: Mild tenderness in the right upper quadrant.  Musculoskeletal:        General: No tenderness. Normal range of motion.     Cervical back: Normal range of motion and neck supple.  Skin:    General: Skin is warm.     Comments:    Neurological:     Mental Status: She is alert and oriented to person, place, and time.   Psychiatric:        Mood and Affect: Affect normal.     LABORATORY DATA:  I have reviewed the data as listed    Component Value Date/Time   NA 141 07/08/2022 1118   K 4.5 07/08/2022 1118   CL 103 07/08/2022 1118   CO2 22 07/08/2022 1118   GLUCOSE 93 07/08/2022 1118   GLUCOSE 88 11/20/2021 1346   BUN 17 07/08/2022 1118   CREATININE 0.65 07/08/2022 1118   CREATININE 0.74 11/20/2021 1346   CALCIUM 9.4 07/08/2022 1118   PROT 6.2 07/08/2022 1118   ALBUMIN 4.5 07/08/2022 1118   AST 18 07/08/2022 1118   ALT 19 07/08/2022 1118   ALKPHOS 106 07/08/2022 1118   BILITOT 0.3 07/08/2022 1118   GFRNONAA >60 04/22/2021 1410   GFRAA 108 02/22/2020 1331    No results found for: "SPEP", "UPEP"  Lab Results  Component Value Date   WBC 6.8 07/08/2022   NEUTROABS 4.3 07/08/2022   HGB 12.8 07/08/2022   HCT 40.5 07/08/2022   MCV 80 07/08/2022   PLT 345 07/08/2022      Chemistry      Component Value Date/Time   NA 141 07/08/2022 1118   K 4.5 07/08/2022 1118   CL 103 07/08/2022 1118   CO2 22 07/08/2022 1118   BUN 17 07/08/2022 1118   CREATININE 0.65 07/08/2022 1118   CREATININE 0.74 11/20/2021 1346      Component Value Date/Time   CALCIUM 9.4 07/08/2022 1118   ALKPHOS 106 07/08/2022 1118   AST 18 07/08/2022 1118   ALT 19 07/08/2022 1118   BILITOT 0.3 07/08/2022 1118       RADIOGRAPHIC STUDIES: I have personally reviewed the radiological images as listed and agreed with the findings in the report. No results found.   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 STABLE. DEC Mammogram 2022-WNL.  No evidence of any recurrence.  Order  mammogram December 2023.  # Continues to be in the ABC trial-finished /; Be well- estyle changes. Discussed with clinical trials nurse  # right upper abdominal pain/  Nausea-voimiting-CT scan February 2023 negative.  No concern for any acute abdomen.  S/p GI evaluation.  If not improved recommend reaching out to GI for further work-up.  Clinically suspect anxiety driven.  Recommend evaluation with PCP/psychiatry.  See below  #Osteopenia T score -1.9 [dec 2022]- STABLE; continue calcium plus vitamin;  # Fatigue/Nausea-vomiting- ? Anxiety related vs organic cause-- Unclear etiology- AUG 2023- CBC/CMP- WNL. Discussed re: psyche referral. Defer to PCP.see above.   # DISPOSITION: # Bil mammogram dec 2023-  # follow up in 6 months-MD; cbc/cmp; ca 27-29-- Dr.B   No orders of the defined types were placed in this encounter.  All questions were answered. The patient knows to call the clinic with any problems, questions or concerns.      Earna Coder, MD 03/03/2023 10:12 AM

## 2023-03-03 NOTE — Assessment & Plan Note (Deleted)
#   Breast cancer-stage II ER/PR positive HER-2/neu negative; declines Enodcrine therapy sec to intolerance. STABLE. DEC Mammogram 2022-WNL.  No evidence of any recurrence.  Order mammogram December 2023.  # Continues to be in the ABC trial-finished /; Be well- estyle changes. Discussed with clinical trials nurse  # right upper abdominal pain/ Nausea-voimiting-CT scan February 2023 negative.  No concern for any acute abdomen.  S/p GI evaluation.  If not improved recommend reaching out to GI for further work-up.  Clinically suspect anxiety driven.  Recommend evaluation with PCP/psychiatry.  See below  #Osteopenia T score -1.9 [dec 2022]- STABLE; continue calcium plus vitamin;  # Fatigue/Nausea-vomiting- ? Anxiety related vs organic cause-- Unclear etiology- AUG 2023- CBC/CMP- WNL. Discussed re: psyche referral. Defer to PCP.see above.   # DISPOSITION: # Bil mammogram dec 2023-  # follow up in 6 months-MD; cbc/cmp; ca 27-29-- Dr.B 

## 2023-03-08 ENCOUNTER — Encounter: Payer: Self-pay | Admitting: Internal Medicine

## 2023-03-08 ENCOUNTER — Inpatient Hospital Stay: Payer: Managed Care, Other (non HMO) | Attending: Internal Medicine

## 2023-03-08 ENCOUNTER — Inpatient Hospital Stay (HOSPITAL_BASED_OUTPATIENT_CLINIC_OR_DEPARTMENT_OTHER): Payer: Managed Care, Other (non HMO) | Admitting: Internal Medicine

## 2023-03-08 ENCOUNTER — Inpatient Hospital Stay: Payer: Managed Care, Other (non HMO)

## 2023-03-08 ENCOUNTER — Inpatient Hospital Stay: Payer: Managed Care, Other (non HMO) | Admitting: Internal Medicine

## 2023-03-08 VITALS — BP 132/84 | HR 102 | Temp 98.0°F | Resp 18 | Wt 175.0 lb

## 2023-03-08 DIAGNOSIS — Z08 Encounter for follow-up examination after completed treatment for malignant neoplasm: Secondary | ICD-10-CM | POA: Insufficient documentation

## 2023-03-08 DIAGNOSIS — C50511 Malignant neoplasm of lower-outer quadrant of right female breast: Secondary | ICD-10-CM

## 2023-03-08 DIAGNOSIS — Z170 Estrogen receptor positive status [ER+]: Secondary | ICD-10-CM

## 2023-03-08 DIAGNOSIS — M8580 Other specified disorders of bone density and structure, unspecified site: Secondary | ICD-10-CM | POA: Diagnosis not present

## 2023-03-08 DIAGNOSIS — Z853 Personal history of malignant neoplasm of breast: Secondary | ICD-10-CM | POA: Insufficient documentation

## 2023-03-08 DIAGNOSIS — D649 Anemia, unspecified: Secondary | ICD-10-CM | POA: Insufficient documentation

## 2023-03-08 LAB — CBC WITH DIFFERENTIAL/PLATELET
Abs Immature Granulocytes: 0.02 10*3/uL (ref 0.00–0.07)
Basophils Absolute: 0.1 10*3/uL (ref 0.0–0.1)
Basophils Relative: 1 %
Eosinophils Absolute: 0.3 10*3/uL (ref 0.0–0.5)
Eosinophils Relative: 4 %
HCT: 35.6 % — ABNORMAL LOW (ref 36.0–46.0)
Hemoglobin: 11.1 g/dL — ABNORMAL LOW (ref 12.0–15.0)
Immature Granulocytes: 0 %
Lymphocytes Relative: 30 %
Lymphs Abs: 2 10*3/uL (ref 0.7–4.0)
MCH: 24.2 pg — ABNORMAL LOW (ref 26.0–34.0)
MCHC: 31.2 g/dL (ref 30.0–36.0)
MCV: 77.7 fL — ABNORMAL LOW (ref 80.0–100.0)
Monocytes Absolute: 0.5 10*3/uL (ref 0.1–1.0)
Monocytes Relative: 8 %
Neutro Abs: 3.7 10*3/uL (ref 1.7–7.7)
Neutrophils Relative %: 57 %
Platelets: 282 10*3/uL (ref 150–400)
RBC: 4.58 MIL/uL (ref 3.87–5.11)
RDW: 15.5 % (ref 11.5–15.5)
WBC: 6.5 10*3/uL (ref 4.0–10.5)
nRBC: 0 % (ref 0.0–0.2)

## 2023-03-08 LAB — IRON AND TIBC
Iron: 53 ug/dL (ref 28–170)
Saturation Ratios: 10 % — ABNORMAL LOW (ref 10.4–31.8)
TIBC: 554 ug/dL — ABNORMAL HIGH (ref 250–450)
UIBC: 501 ug/dL

## 2023-03-08 LAB — FERRITIN: Ferritin: 6 ng/mL — ABNORMAL LOW (ref 11–307)

## 2023-03-08 LAB — COMPREHENSIVE METABOLIC PANEL
ALT: 28 U/L (ref 0–44)
AST: 27 U/L (ref 15–41)
Albumin: 3.7 g/dL (ref 3.5–5.0)
Alkaline Phosphatase: 97 U/L (ref 38–126)
Anion gap: 6 (ref 5–15)
BUN: 15 mg/dL (ref 6–20)
CO2: 26 mmol/L (ref 22–32)
Calcium: 8.8 mg/dL — ABNORMAL LOW (ref 8.9–10.3)
Chloride: 107 mmol/L (ref 98–111)
Creatinine, Ser: 0.67 mg/dL (ref 0.44–1.00)
GFR, Estimated: >60 mL/min (ref >60)
Glucose, Bld: 103 mg/dL — ABNORMAL HIGH (ref 70–99)
Potassium: 4.2 mmol/L (ref 3.5–5.1)
Sodium: 139 mmol/L (ref 135–145)
Total Bilirubin: 0.5 mg/dL (ref 0.3–1.2)
Total Protein: 6.1 g/dL — ABNORMAL LOW (ref 6.5–8.1)

## 2023-03-08 NOTE — Assessment & Plan Note (Addendum)
#   Breast cancer-stage II ER/PR positive HER-2/neu negative; declines Enodcrine therapy sec to intolerance. Stable.  No evidence of any recurrence.  BIL DEC  2023- WNL>  # Anemia- Hb 11.2; check iron studies/ferritin- last colonoscopy- 2017 [Dr.Wohl]- no blood in stools. Will refer to Dr.Wohl.   #Osteopenia T score -1.9 [dec 2022]- STABLE; continue calcium plus vitamin  # Anxiety- stable.   # allergies; continue antihistamines.   # DISPOSITION: # refer to Dr.Wohl re: anemia.  # ADD-  iron studies/ferritin to today labs- I have ordered the labs.  # follow up in 3 months-MD; cbc/cmp; iron studies; ferritin; ca 27-29-- Dr.B

## 2023-03-08 NOTE — Progress Notes (Signed)
Patient here for oncology follow-up appointment, expresses no new concerns at this time.    

## 2023-03-08 NOTE — Progress Notes (Signed)
[facility] OFFICE PROGRESS NOTE  Patient Care Team: Alba Cory, MD as PCP - General (Family Medicine) Kieth Brightly, MD (General Surgery) Alba Cory, MD as Attending Physician (Family Medicine) Jim Like, RN as Oncology Nurse Navigator Earna Coder, MD as Consulting Physician (Internal Medicine)   Cancer Staging  No matching staging information was found for the patient.   Oncology History Overview Note  # DEC 2017- RIGHT BREAST Stage II [pT1cpN1sn; screening] ER/PR- Pos- 90%; her 2 NEU- NEG s/p Lumpect & SLNBx; Dr.Sankar ]; JAN 2018- ddAC -T x12. AUG 2nd [finished RT].  # AUG 21st 2018- Start TAM; Nov 2018- Stopped sec intol/mood swings; arthralgias];  DEC 2019- Arimidex 1 mg/day; STOPPED JAN 2020 sec to intolerarnce.   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    Shelby Barry 57 y.o.  female pleasant patient above history of stage II breast cancer ER PR positive currently NOT on adjuvant endocrine therapy [sec to intolerance] is in for follow-up/ ABC-be well clinical studies.   Patient here for oncology follow-up appointment, expresses no new concerns at this time   Complains of allergies.   Continues to have chronic fatigue.  but worse per pt.  Denies any blood in stools; or black colored stools.   Denies any worsening joint pains.  No new shortness of breath or cough.  No fever and chills.  Review of Systems  Constitutional:  Positive for malaise/fatigue. Negative for chills, diaphoresis, fever and weight loss.  HENT:  Negative for  nosebleeds and sore throat.   Eyes:  Negative for double vision.  Respiratory:  Negative for hemoptysis and sputum production.   Cardiovascular:  Negative for chest pain, palpitations, orthopnea and leg swelling.  Gastrointestinal:  Positive for nausea and vomiting. Negative for blood in stool, constipation, diarrhea, heartburn and melena.  Genitourinary:  Negative for dysuria, frequency and urgency.  Musculoskeletal:  Negative for back pain.  Skin: Negative.  Negative for itching and rash.  Neurological:  Negative for dizziness, tingling, focal weakness, weakness and headaches.  Endo/Heme/Allergies:  Does not bruise/bleed easily.      PAST MEDICAL HISTORY :  Past Medical History:  Diagnosis Date   Anemia    Anxiety    Breast cancer 09/2016   right breast   Bursitis of right shoulder    Cancer    breast   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, estrogen receptor positive    Cataract, bilateral    Chemotherapy induced nausea and vomiting    Cold    states she has had it for 2 months   Depression    GERD (gastroesophageal reflux disease)    uses baking soda for symptoms   Insomnia    Lumbago    Personal history of chemotherapy 11/22/2016   Personal history of radiation therapy 01/20/2017   Vitamin D deficiency     PAST SURGICAL HISTORY :   Past Surgical History:  Procedure Laterality Date   ABDOMINAL HYSTERECTOMY  04/20/2014   BILATERAL SALPINGECTOMY     BREAST BIOPSY Right 09/27/2016   positive   BREAST LUMPECTOMY Right  10/12/2016   positive   BREAST LUMPECTOMY WITH SENTINEL LYMPH NODE BIOPSY Right 10/12/2016   Procedure: BREAST LUMPECTOMY WITH SENTINEL LYMPH NODE BX;  Surgeon: Kieth Brightly, MD;  Location: [facility];  Service: General;  Laterality: Right;   COLONOSCOPY WITH PROPOFOL N/A 05/20/2016   Procedure: COLONOSCOPY WITH PROPOFOL;  Surgeon: Midge Minium, MD;  Location: [facility];  Service: Endoscopy;  Laterality: N/A;    ECTOPIC PREGNANCY SURGERY     ENDOMETRIAL ABLATION     PORTACATH PLACEMENT Left 11/04/2016   Procedure: INSERTION PORT-A-CATH;  Surgeon: Kieth Brightly, MD;  Location: [facility];  Service: General;  Laterality: Left;  Left subclavian vein   TUBAL LIGATION      FAMILY HISTORY :   Family History  Problem Relation Age of Onset   Heart disease Mother    Depression Father    Obesity Father    Diabetes Father    Alcohol abuse Father    COPD Father    Alcohol abuse Brother    Seizures Brother    Breast cancer Sister 10    SOCIAL HISTORY:   Social History   Tobacco Use   Smoking status: Former    Packs/day: 0.50    Years: 20.00    Additional pack years: 0.00    Total pack years: 10.00    Types: Cigarettes    Quit date: 08/23/2016    Years since quitting: 6.5   Smokeless tobacco: Never  Vaping Use   Vaping Use: Never used  Substance Use Topics   Alcohol use: Yes    Alcohol/week: 0.0 standard drinks of alcohol    Comment: occassional   Drug use: No    ALLERGIES:  is allergic to adhesive [tape] and nickel.  MEDICATIONS:  Current Outpatient Medications  Medication Sig Dispense Refill   acetaminophen (TYLENOL) 325 MG tablet Take 325 mg by mouth every 6 (six) hours as needed for fever.     albuterol (VENTOLIN HFA) 108 (90 Base) MCG/ACT inhaler Inhale 1-2 puffs into the lungs every 6 (six) hours as needed for wheezing or shortness of breath. 1 g 0   amphetamine-dextroamphetamine (ADDERALL XR) 20 MG 24 hr capsule Take 1 capsule (20 mg total) by mouth daily. 30 capsule 0   dicyclomine (BENTYL) 10 MG capsule Take 1 capsule (10 mg total) by mouth 3 (three) times daily as needed for spasms. 30 capsule 0   Fluticasone-Umeclidin-Vilant (TRELEGY ELLIPTA) 100-62.5-25 MCG/ACT AEPB Inhale 1 puff into the lungs daily. 1 each 2   loratadine (CLARITIN) 10 MG tablet Take 1 tablet (10 mg total) by mouth daily. 30 tablet 2   meloxicam (MOBIC) 15 MG tablet Take 1 tablet (15 mg total) by  mouth daily. 90 tablet 0   omeprazole (PRILOSEC) 20 MG capsule Take 1 capsule (20 mg total) by mouth daily. 90 capsule 1   zolpidem (AMBIEN) 5 MG tablet Take 1 tablet (5 mg total) by mouth at bedtime as needed for sleep. 90 tablet 0   amphetamine-dextroamphetamine (ADDERALL XR) 20 MG 24 hr capsule Take 1 capsule (20 mg total) by mouth daily. 30 capsule 0   amphetamine-dextroamphetamine (ADDERALL XR) 20 MG 24 hr capsule Take 1 capsule (20 mg total) by mouth daily. 30 capsule 0   busPIRone (BUSPAR) 5 MG tablet Take 1-2 tablets (5-10 mg total) by mouth daily as needed. (Patient not taking: Reported on 03/08/2023) 90 tablet 0   No current facility-administered medications for this visit.    PHYSICAL EXAMINATION: ECOG PERFORMANCE STATUS:  0 - Asymptomatic  BP 132/84 (Patient Position: Sitting)   Pulse (!) 102   Temp 98 F (36.7 C)   Resp 18   Wt 175 lb (79.4 kg)   SpO2 97%   BMI 32.53 kg/m   Filed Weights   03/08/23 1104  Weight: 175 lb (79.4 kg)     Physical Exam HENT:     Head: Normocephalic and atraumatic.     Mouth/Throat:     Pharynx: No oropharyngeal exudate.  Eyes:     Pupils: Pupils are equal, round, and reactive to light.  Cardiovascular:     Rate and Rhythm: Normal rate and regular rhythm.  Pulmonary:     Effort: No respiratory distress.     Breath sounds: No wheezing.  Abdominal:     General: Bowel sounds are normal. There is no distension.     Palpations: Abdomen is soft. There is no mass.     Tenderness: There is abdominal tenderness. There is no guarding or rebound.     Comments: Mild tenderness in the right upper quadrant.  Musculoskeletal:        General: No tenderness. Normal range of motion.     Cervical back: Normal range of motion and neck supple.  Skin:    General: Skin is warm.     Comments:    Neurological:     Mental Status: She is alert and oriented to person, place, and time.  Psychiatric:        Mood and Affect: Affect normal.      LABORATORY DATA:  I have reviewed the data as listed    Component Value Date/Time   NA 139 03/08/2023 1044   NA 141 07/08/2022 1118   K 4.2 03/08/2023 1044   CL 107 03/08/2023 1044   CO2 26 03/08/2023 1044   GLUCOSE 103 (H) 03/08/2023 1044   BUN 15 03/08/2023 1044   BUN 17 07/08/2022 1118   CREATININE 0.67 03/08/2023 1044   CREATININE 0.74 11/20/2021 1346   CALCIUM 8.8 (L) 03/08/2023 1044   PROT 6.1 (L) 03/08/2023 1044   PROT 6.2 07/08/2022 1118   ALBUMIN 3.7 03/08/2023 1044   ALBUMIN 4.5 07/08/2022 1118   AST 27 03/08/2023 1044   ALT 28 03/08/2023 1044   ALKPHOS 97 03/08/2023 1044   BILITOT 0.5 03/08/2023 1044   BILITOT 0.3 07/08/2022 1118   GFRNONAA >60 03/08/2023 1044   GFRAA 108 02/22/2020 1331    No results found for: "SPEP", "UPEP"  Lab Results  Component Value Date   WBC 6.5 03/08/2023   NEUTROABS 3.7 03/08/2023   HGB 11.1 (L) 03/08/2023   HCT 35.6 (L) 03/08/2023   MCV 77.7 (L) 03/08/2023   PLT 282 03/08/2023      Chemistry      Component Value Date/Time   NA 139 03/08/2023 1044   NA 141 07/08/2022 1118   K 4.2 03/08/2023 1044   CL 107 03/08/2023 1044   CO2 26 03/08/2023 1044   BUN 15 03/08/2023 1044   BUN 17 07/08/2022 1118   CREATININE 0.67 03/08/2023 1044   CREATININE 0.74 11/20/2021 1346      Component Value Date/Time   CALCIUM 8.8 (L) 03/08/2023 1044   ALKPHOS 97 03/08/2023 1044   AST 27 03/08/2023 1044   ALT 28 03/08/2023 1044   BILITOT 0.5 03/08/2023 1044   BILITOT 0.3 07/08/2022 1118       RADIOGRAPHIC STUDIES: I have personally reviewed the radiological images as listed and agreed with the  findings in the report. No results found.   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 Stable.  No evidence of any recurrence.  BIL DEC  2023- WNL>  # Anemia-  Hb 11.2; check iron studies/ferritin- last colonoscopy- 2017 [Dr.Wohl]- no blood in stools. Will refer to Dr.Wohl.   #Osteopenia T score -1.9 [dec 2022]- STABLE; continue calcium plus vitamin  # Anxiety- stable.   # allergies; continue antihistamines.   # DISPOSITION: # refer to Dr.Wohl re: anemia.  # ADD-  iron studies/ferritin to today labs- I have ordered the labs.  # follow up in 3 months-MD; cbc/cmp; iron studies; ferritin; ca 27-29-- Dr.B   Orders Placed This Encounter  Procedures   Ferritin    Standing Status:   Future    Number of Occurrences:   1    Standing Expiration Date:   03/07/2024   Iron and TIBC    Standing Status:   Future    Number of Occurrences:   1    Standing Expiration Date:   03/07/2024   CBC with Differential (Cancer Center Only)    Standing Status:   Standing    Number of Occurrences:   1    Standing Expiration Date:   03/07/2024   CMP (Cancer Center only)    Standing Status:   Standing    Number of Occurrences:   1    Standing Expiration Date:   03/07/2024   Iron and TIBC([facility])    Standing Status:   Standing    Number of Occurrences:   1    Standing Expiration Date:   03/07/2024   Ferritin    Standing Status:   Standing    Number of Occurrences:   1    Standing Expiration Date:   03/07/2024   Cancer antigen 27.29    Standing Status:   Standing    Number of Occurrences:   1    Standing Expiration Date:   03/07/2024   Ambulatory referral to Gastroenterology    Referral Priority:   Routine    Referral Type:   Consultation    Referral Reason:   Specialty Services Required    Referred to Provider:   Midge Minium, MD    Number of Visits Requested:   1   All questions were answered. The patient knows to call the clinic with any problems, questions or concerns.      Earna Coder, MD 03/08/2023 1:06 PM

## 2023-03-09 LAB — CANCER ANTIGEN 27.29: CA 27.29: 11.4 U/mL (ref 0.0–38.6)

## 2023-03-09 NOTE — Progress Notes (Signed)
Called and informed patient. Also confirmed with Dr. Annabell Sabal office that the referral was received and they will reach out to patient to schedule per Misty Stanley (representative).

## 2023-05-04 NOTE — Progress Notes (Signed)
Name: Shelby Barry   MRN: [id number]    DOB: November 16, 1966   Date:05/05/2023       Progress Note  Subjective  Chief Complaint  Follow Up  HPI  ADD combined type: she told me she always felt that she had ADD so we referred her for formal testing , she saw [facility] in [place] and the diagnosis was positive for ADD She states Adderal XR helps her focus, she feels less anxious when she takes medication and feels more accomplished . She skips doses when off work, follow up in 4 months   Major depression chronic recurrent: she has a long history of depression over the years, her father committed suicide when she was in her 70's.  She placed her mother in the nursing home Cannot tolerate Lexapro, she was taking zoloft helped with symptoms but ran out medication and states only wants to take prn medication. She was really stressed since her grown son moved back in with her and her sister in the  Spring of 2023 . He is a Clinical research associate but has been behaving differently. Change in personality, hanging out with homeless people, he was giving all his belongings away. He was not really working, he finally moved out of the house, he is currently working and living in [place] since Feb 2024 . She is living with her sister, states feeling fine. Only takes buspar very seldom.    History of breast cancer: still seeing hematologist, stopped anastrozole months ago, tried Tamoxifen for about 6 months but also stopped in 2019 on her own . She is up to date with mammogram, last mammogram was 12/23, during last visit with oncologist she was found to have iron deficiency anemia, she is taking iron pills and was advised to follow up with GI, she asked me to recheck her labs today. Advised even if normal level must see GI for further evaluation   GERD: she is back on PPI daily and doing well    COPD with hyporesponsiveness also has a history of Asthma she was using Advair but stopped after an episode of thrush,  currently no cough, wheezing or sob. She quit smoking. She has Trelegy now and still only takes it prn   RUQ pain and nausea: happening when she eats, oncologist ordered US that showed two small gallbladder polyps , she saw GI and was given reassurance. She continues to vomit but she states it started after breast cancer treatment but stable, the vomiting is down to at most twice a week now She will follow up with GI again  Discussed referral to surgeon or HIDA scan but she wants to hold off for now   Intermittent shoulder pain: due to work - takes meloxicam at most a couple of times a month   Patient Active Problem List   Diagnosis Date Noted   COPD with bronchial hyperresponsiveness (HCC) 02/01/2023   Attention deficit hyperactivity disorder (ADHD), combined type 02/01/2023   Asthma, well controlled 02/01/2023   Obesity (BMI 30.0-34.9) 05/18/2018   Liver lesion 11/09/2016   Chronic bronchitis (HCC) 10/29/2016   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, estrogen receptor positive (HCC) 10/26/2016   Major depression, recurrent, chronic (HCC) 08/22/2015   Bursitis of shoulder 08/22/2015   Bilateral cataracts 08/22/2015   Circadian rhythm sleep disorder, 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   Gastroesophageal reflux disease without esophagitis 08/22/2015   Vitamin D deficiency 08/22/2015   LBP (low back pain) 08/22/2015   GAD (generalized  anxiety disorder) 08/22/2015   History of anemia 08/17/2007   Insomnia 08/16/2007    Past Surgical History:  Procedure Laterality Date   ABDOMINAL HYSTERECTOMY  04/20/2014   BILATERAL SALPINGECTOMY     BREAST BIOPSY Right 09/27/2016   positive   BREAST LUMPECTOMY Right 10/12/2016   positive   BREAST LUMPECTOMY WITH SENTINEL LYMPH NODE BIOPSY Right 10/12/2016   Procedure: BREAST LUMPECTOMY WITH SENTINEL LYMPH NODE BX;  Surgeon: Kieth Brightly, MD;  Location: [facility];  Service: General;  Laterality: Right;   COLONOSCOPY WITH  PROPOFOL N/A 05/20/2016   Procedure: COLONOSCOPY WITH PROPOFOL;  Surgeon: Midge Minium, MD;  Location: [facility];  Service: Endoscopy;  Laterality: N/A;   ECTOPIC PREGNANCY SURGERY     ENDOMETRIAL ABLATION     PORTACATH PLACEMENT Left 11/04/2016   Procedure: INSERTION PORT-A-CATH;  Surgeon: Kieth Brightly, MD;  Location: [facility];  Service: General;  Laterality: Left;  Left subclavian vein   TUBAL LIGATION      Family History  Problem Relation Age of Onset   Heart disease Mother    Depression Father    Obesity Father    Diabetes Father    Alcohol abuse Father    COPD Father    Alcohol abuse Brother    Seizures Brother    Breast cancer Sister 37    Social History   Tobacco Use   Smoking status: Former    Packs/day: 0.50    Years: 20.00    Additional pack years: 0.00    Total pack years: 10.00    Types: Cigarettes    Quit date: 08/23/2016    Years since quitting: 6.7   Smokeless tobacco: Never  Substance Use Topics   Alcohol use: Yes    Alcohol/week: 0.0 standard drinks of alcohol    Comment: occassional     Current Outpatient Medications:    acetaminophen (TYLENOL) 325 MG tablet, Take 325 mg by mouth every 6 (six) hours as needed for fever., Disp: , Rfl:    albuterol (VENTOLIN HFA) 108 (90 Base) MCG/ACT inhaler, Inhale 1-2 puffs into the lungs every 6 (six) hours as needed for wheezing or shortness of breath., Disp: 1 g, Rfl: 0   dicyclomine (BENTYL) 10 MG capsule, Take 1 capsule (10 mg total) by mouth 3 (three) times daily as needed for spasms., Disp: 30 capsule, Rfl: 0   Fluticasone-Umeclidin-Vilant (TRELEGY ELLIPTA) 100-62.5-25 MCG/ACT AEPB, Inhale 1 puff into the lungs daily., Disp: 1 each, Rfl: 2   loratadine (CLARITIN) 10 MG tablet, Take 1 tablet (10 mg total) by mouth daily., Disp: 30 tablet, Rfl: 2   zolpidem (AMBIEN) 5 MG tablet, Take 1 tablet (5 mg total) by mouth at bedtime as needed for sleep., Disp: 90 tablet, Rfl: 0    amphetamine-dextroamphetamine (ADDERALL XR) 20 MG 24 hr capsule, Take 1 capsule (20 mg total) by mouth daily., Disp: 30 capsule, Rfl: 0   amphetamine-dextroamphetamine (ADDERALL XR) 20 MG 24 hr capsule, Take 1 capsule (20 mg total) by mouth daily., Disp: 30 capsule, Rfl: 0   amphetamine-dextroamphetamine (ADDERALL XR) 20 MG 24 hr capsule, Take 1 capsule (20 mg total) by mouth daily., Disp: 30 capsule, Rfl: 0   busPIRone (BUSPAR) 5 MG tablet, Take 1-2 tablets (5-10 mg total) by mouth daily as needed. (Patient not taking: Reported on 03/08/2023), Disp: 90 tablet, Rfl: 0   meloxicam (MOBIC) 15 MG tablet, Take 1 tablet (15 mg total) by mouth daily., Disp: 90 tablet, Rfl: 0   omeprazole (  PRILOSEC) 20 MG capsule, Take 1 capsule (20 mg total) by mouth daily., Disp: 90 capsule, Rfl: 1  Allergies  Allergen Reactions   Adhesive [Tape] Other (See Comments)    Blisters/rash  PLEASE USE PAPER TAPE ONLY   Nickel     PT CAN WEAR GOLD, STERLING SILVER WITH NO PROBLEM-PT HAS NEVER HAD ANY PROBLEMS IN THE OR    I personally reviewed active problem list, medication list, allergies, family history, social history, health maintenance with the patient/caregiver today.   ROS  Ten systems reviewed and is negative except as mentioned in HPI   Objective  Vitals:   05/05/23 1147  BP: 130/80  Pulse: 99  Resp: 16  Temp: 97.9 F (36.6 C)  TempSrc: Oral  SpO2: 96%  Weight: 173 lb 12.8 oz (78.8 kg)  Height: 5' 1.5" (1.562 m)    Body mass index is 32.31 kg/m.  Physical Exam  Constitutional: Patient appears well-developed and well-nourished. Obese  No distress.  HEENT: head atraumatic, normocephalic, pupils equal and reactive to light, neck supple Cardiovascular: Normal rate, regular rhythm and normal heart sounds.  No murmur heard. No BLE edema. Pulmonary/Chest: Effort normal and breath sounds normal. No respiratory distress. Abdominal: Soft.  There is no tenderness. Psychiatric: Patient has a normal  mood and affect. behavior is normal. Judgment and thought content normal.    PHQ2/9:    05/05/2023   11:49 AM 02/01/2023   10:08 AM 10/26/2022   10:36 AM 07/08/2022    9:53 AM 02/24/2022   10:45 AM  Depression screen PHQ 2/9  Decreased Interest 1 1 0 0 0  Down, Depressed, Hopeless 1 1 0 0 0  PHQ - 2 Score 2 2 0 0 0  Altered sleeping 1 1 0 0 0  Tired, decreased energy 0 1 0 0 0  Change in appetite 0 0 0 0 0  Feeling bad or failure about yourself  0 1 0 0 0  Trouble concentrating 0 1 0 0 0  Moving slowly or fidgety/restless 0 0 0 0 0  Suicidal thoughts 0 0 0 0 0  PHQ-9 Score 3 6 0 0 0  Difficult doing work/chores Not difficult at all Not difficult at all  Not difficult at all Not difficult at all    phq 9 is positive   Fall Risk:    05/05/2023   11:48 AM 02/01/2023   10:02 AM 10/26/2022   10:35 AM 07/08/2022    9:52 AM 02/24/2022   10:45 AM  Fall Risk   Falls in the past year? 0  0 0 1  Number falls in past yr: 0  0 0 1  Injury with Fall? 0  0 0 1  Risk for fall due to :  No Fall Risks No Fall Risks No Fall Risks   Follow up  Falls prevention discussed Falls prevention discussed Falls prevention discussed       Assessment & Plan  1. COPD with bronchial hyperresponsiveness (HCC)  She only takes Trelegy prn now , stable   2. Major depression, recurrent, chronic (HCC)  She states only takes Buspar prn   3. Vitamin D deficiency  Continue supplementations  4. Attention deficit hyperactivity disorder (ADHD), combined type  - amphetamine-dextroamphetamine (ADDERALL XR) 20 MG 24 hr capsule; Take 1 capsule (20 mg total) by mouth daily.  Dispense: 30 capsule; Refill: 0 - amphetamine-dextroamphetamine (ADDERALL XR) 20 MG 24 hr capsule; Take 1 capsule (20 mg total) by mouth daily.  Dispense: 30  capsule; Refill: 0 - amphetamine-dextroamphetamine (ADDERALL XR) 20 MG 24 hr capsule; Take 1 capsule (20 mg total) by mouth daily.  Dispense: 30 capsule; Refill: 0  5. Iron deficiency  anemia, unspecified iron deficiency anemia type  - CBC with Differential/Platelet - Iron, TIBC and Ferritin Panel  6. Asthma, mild intermittent, well-controlled  Stable   7. Gastroesophageal reflux disease without esophagitis  - omeprazole (PRILOSEC) 20 MG capsule; Take 1 capsule (20 mg total) by mouth daily.  Dispense: 90 capsule; Refill: 1   8. Bilateral shoulder pain, unspecified chronicity  Intermittent, due to work, she takes at most once a week  - meloxicam (MOBIC) 15 MG tablet; Take 1 tablet (15 mg total) by mouth daily.  Dispense: 90 tablet; Refill: 0

## 2023-05-05 ENCOUNTER — Ambulatory Visit: Payer: Managed Care, Other (non HMO) | Admitting: Family Medicine

## 2023-05-05 ENCOUNTER — Other Ambulatory Visit: Payer: Self-pay

## 2023-05-05 ENCOUNTER — Encounter: Payer: Self-pay | Admitting: Family Medicine

## 2023-05-05 VITALS — BP 130/80 | HR 99 | Temp 97.9°F | Resp 16 | Ht 61.5 in | Wt 173.8 lb

## 2023-05-05 DIAGNOSIS — K219 Gastro-esophageal reflux disease without esophagitis: Secondary | ICD-10-CM

## 2023-05-05 DIAGNOSIS — M25511 Pain in right shoulder: Secondary | ICD-10-CM

## 2023-05-05 DIAGNOSIS — F339 Major depressive disorder, recurrent, unspecified: Secondary | ICD-10-CM | POA: Diagnosis not present

## 2023-05-05 DIAGNOSIS — J4520 Mild intermittent asthma, uncomplicated: Secondary | ICD-10-CM

## 2023-05-05 DIAGNOSIS — F902 Attention-deficit hyperactivity disorder, combined type: Secondary | ICD-10-CM

## 2023-05-05 DIAGNOSIS — D509 Iron deficiency anemia, unspecified: Secondary | ICD-10-CM

## 2023-05-05 DIAGNOSIS — E559 Vitamin D deficiency, unspecified: Secondary | ICD-10-CM

## 2023-05-05 DIAGNOSIS — J441 Chronic obstructive pulmonary disease with (acute) exacerbation: Secondary | ICD-10-CM | POA: Diagnosis not present

## 2023-05-05 DIAGNOSIS — M25512 Pain in left shoulder: Secondary | ICD-10-CM

## 2023-05-05 MED ORDER — AMPHETAMINE-DEXTROAMPHET ER 20 MG PO CP24
20.0000 mg | ORAL_CAPSULE | Freq: Every day | ORAL | 0 refills | Status: DC
Start: 2023-05-05 — End: 2023-09-06

## 2023-05-05 MED ORDER — AMPHETAMINE-DEXTROAMPHET ER 20 MG PO CP24: 20.0000 mg | ORAL_CAPSULE | Freq: Every day | ORAL | 0 refills | Status: DC

## 2023-05-05 MED ORDER — MELOXICAM 15 MG PO TABS: 15.0000 mg | ORAL_TABLET | Freq: Every day | ORAL | 0 refills | Status: DC

## 2023-05-05 MED ORDER — OMEPRAZOLE 20 MG PO CPDR: 20.0000 mg | DELAYED_RELEASE_CAPSULE | Freq: Every day | ORAL | 1 refills | Status: DC

## 2023-06-07 ENCOUNTER — Inpatient Hospital Stay (HOSPITAL_BASED_OUTPATIENT_CLINIC_OR_DEPARTMENT_OTHER): Payer: Managed Care, Other (non HMO) | Admitting: Internal Medicine

## 2023-06-07 ENCOUNTER — Inpatient Hospital Stay: Payer: Managed Care, Other (non HMO) | Attending: Internal Medicine

## 2023-06-07 ENCOUNTER — Encounter: Payer: Self-pay | Admitting: Internal Medicine

## 2023-06-07 VITALS — BP 123/50 | HR 89 | Temp 96.5°F | Ht 61.5 in | Wt 174.6 lb

## 2023-06-07 DIAGNOSIS — M8580 Other specified disorders of bone density and structure, unspecified site: Secondary | ICD-10-CM | POA: Diagnosis not present

## 2023-06-07 DIAGNOSIS — I890 Lymphedema, not elsewhere classified: Secondary | ICD-10-CM | POA: Diagnosis not present

## 2023-06-07 DIAGNOSIS — Z170 Estrogen receptor positive status [ER+]: Secondary | ICD-10-CM | POA: Diagnosis not present

## 2023-06-07 DIAGNOSIS — Z853 Personal history of malignant neoplasm of breast: Secondary | ICD-10-CM | POA: Insufficient documentation

## 2023-06-07 DIAGNOSIS — Z08 Encounter for follow-up examination after completed treatment for malignant neoplasm: Secondary | ICD-10-CM | POA: Diagnosis not present

## 2023-06-07 DIAGNOSIS — F419 Anxiety disorder, unspecified: Secondary | ICD-10-CM | POA: Insufficient documentation

## 2023-06-07 DIAGNOSIS — E559 Vitamin D deficiency, unspecified: Secondary | ICD-10-CM | POA: Diagnosis not present

## 2023-06-07 DIAGNOSIS — C50511 Malignant neoplasm of lower-outer quadrant of right female breast: Secondary | ICD-10-CM

## 2023-06-07 DIAGNOSIS — D509 Iron deficiency anemia, unspecified: Secondary | ICD-10-CM | POA: Insufficient documentation

## 2023-06-07 LAB — CMP (CANCER CENTER ONLY)
ALT: 23 U/L (ref 0–44)
AST: 20 U/L (ref 15–41)
Albumin: 3.9 g/dL (ref 3.5–5.0)
Alkaline Phosphatase: 88 U/L (ref 38–126)
Anion gap: 7 (ref 5–15)
BUN: 14 mg/dL (ref 6–20)
CO2: 25 mmol/L (ref 22–32)
Calcium: 9 mg/dL (ref 8.9–10.3)
Chloride: 105 mmol/L (ref 98–111)
Creatinine: 0.83 mg/dL (ref 0.44–1.00)
GFR, Estimated: >60 mL/min (ref >60)
Glucose, Bld: 119 mg/dL — ABNORMAL HIGH (ref 70–99)
Potassium: 4.1 mmol/L (ref 3.5–5.1)
Sodium: 137 mmol/L (ref 135–145)
Total Bilirubin: 0.2 mg/dL — ABNORMAL LOW (ref 0.3–1.2)
Total Protein: 6.4 g/dL — ABNORMAL LOW (ref 6.5–8.1)

## 2023-06-07 LAB — CBC WITH DIFFERENTIAL (CANCER CENTER ONLY)
Abs Immature Granulocytes: 0.01 10*3/uL (ref 0.00–0.07)
Basophils Absolute: 0.1 10*3/uL (ref 0.0–0.1)
Basophils Relative: 1 %
Eosinophils Absolute: 0.2 10*3/uL (ref 0.0–0.5)
Eosinophils Relative: 4 %
HCT: 40.4 % (ref 36.0–46.0)
Hemoglobin: 13.1 g/dL (ref 12.0–15.0)
Immature Granulocytes: 0 %
Lymphocytes Relative: 32 %
Lymphs Abs: 1.8 10*3/uL (ref 0.7–4.0)
MCH: 26.1 pg (ref 26.0–34.0)
MCHC: 32.4 g/dL (ref 30.0–36.0)
MCV: 80.6 fL (ref 80.0–100.0)
Monocytes Absolute: 0.5 10*3/uL (ref 0.1–1.0)
Monocytes Relative: 8 %
Neutro Abs: 3.2 10*3/uL (ref 1.7–7.7)
Neutrophils Relative %: 55 %
Platelet Count: 264 10*3/uL (ref 150–400)
RBC: 5.01 MIL/uL (ref 3.87–5.11)
RDW: 16.4 % — ABNORMAL HIGH (ref 11.5–15.5)
WBC Count: 5.8 10*3/uL (ref 4.0–10.5)
nRBC: 0 % (ref 0.0–0.2)

## 2023-06-07 LAB — IRON AND TIBC
Iron: 114 ug/dL (ref 28–170)
Saturation Ratios: 23 % (ref 10.4–31.8)
TIBC: 496 ug/dL — ABNORMAL HIGH (ref 250–450)
UIBC: 382 ug/dL

## 2023-06-07 LAB — FERRITIN: Ferritin: 9 ng/mL — ABNORMAL LOW (ref 11–307)

## 2023-06-07 NOTE — Progress Notes (Signed)
C/o cramping in legs and left side.

## 2023-06-07 NOTE — Assessment & Plan Note (Addendum)
#   Breast cancer-stage II ER/PR positive HER-2/neu negative; declines Enodcrine therapy sec to intolerance.   No evidence of any recurrence.  BIL mammo- DEC  2023- WNL. Stable.   # Iron def Anemia- last colonoscopy- 2017 [Dr.Wohl]- no blood in stools. On PO iron-;- Awaiting appt AUG 2024- with Dr.Wohl- re EGD/colo   # Right UE lymphedema- G-1 stable.   #Osteopenia T score -1.9 [dec 2022]- stable; recommend compliance with  continue calcium plus vitamin; repeat BMD in 6 month.   # Anxiety- stable.   # DISPOSITION: # in dec 2024- Bil mammo/ BMD  # follow up in 6 months-MD; cbc/cmp; iron studies; ferritin; ca 27-29-- Dr.B

## 2023-06-07 NOTE — Progress Notes (Signed)
[facility] OFFICE PROGRESS NOTE  Patient Care Team: Alba Cory, MD as PCP - General (Family Medicine) Kieth Brightly, MD (General Surgery) Alba Cory, MD as Attending Physician (Family Medicine) Jim Like, RN as Oncology Nurse Navigator Earna Coder, MD as Consulting Physician (Internal Medicine)   Cancer Staging  No matching staging information was found for the patient.    Oncology History Overview Note  # DEC 2017- RIGHT BREAST Stage II [pT1cpN1sn; screening] ER/PR- Pos- 90%; her 2 NEU- NEG s/p Lumpect & SLNBx; Dr.Sankar ]; JAN 2018- ddAC -T x12. AUG 2nd [finished RT].  # AUG 21st 2018- Start TAM; Nov 2018- Stopped sec intol/mood swings; arthralgias];  DEC 2019- Arimidex 1 mg/day; STOPPED JAN 2020 sec to intolerarnce.   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 Patient ambulating-independently.    Shelby Barry 57 y.o.  female pleasant patient above history of stage II breast cancer ER PR positive currently NOT on adjuvant endocrine therapy [sec to intolerance] is in for follow-up/ ABC-be well clinical studies.   Patient states her fatigue is improved. Patient on PO iron. Denies any blood in stools; or black colored stools.   Denies any worsening joint pains.  No new shortness of breath or cough. No fever and chills.  Review of Systems  Constitutional:  Positive for malaise/fatigue. Negative for chills, diaphoresis, fever and weight loss.  HENT:  Negative for nosebleeds and sore throat.   Eyes:  Negative for double vision.  Respiratory:   Negative for hemoptysis and sputum production.   Cardiovascular:  Negative for chest pain, palpitations, orthopnea and leg swelling.  Gastrointestinal:  Positive for nausea and vomiting. Negative for blood in stool, constipation, diarrhea, heartburn and melena.  Genitourinary:  Negative for dysuria, frequency and urgency.  Musculoskeletal:  Negative for back pain.  Skin: Negative.  Negative for itching and rash.  Neurological:  Negative for dizziness, tingling, focal weakness, weakness and headaches.  Endo/Heme/Allergies:  Does not bruise/bleed easily.      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, estrogen receptor positive (HCC)    Cataract, bilateral    Chemotherapy induced nausea and vomiting    Cold    states she has had it for 2 months   Depression    GERD (gastroesophageal reflux disease)    uses baking soda for symptoms   Insomnia    Lumbago    Personal history of chemotherapy 11/22/2016   Personal history of radiation therapy 01/20/2017   Vitamin D deficiency     PAST SURGICAL HISTORY :   Past Surgical History:  Procedure Laterality Date   ABDOMINAL HYSTERECTOMY  04/20/2014   BILATERAL SALPINGECTOMY     BREAST BIOPSY Right 09/27/2016   positive   BREAST LUMPECTOMY Right 10/12/2016   positive   BREAST LUMPECTOMY WITH SENTINEL LYMPH NODE BIOPSY Right 10/12/2016  Procedure: BREAST LUMPECTOMY WITH SENTINEL LYMPH NODE BX;  Surgeon: Kieth Brightly, MD;  Location: [facility];  Service: General;  Laterality: Right;   COLONOSCOPY WITH PROPOFOL N/A 05/20/2016   Procedure: COLONOSCOPY WITH PROPOFOL;  Surgeon: Midge Minium, MD;  Location: [facility];  Service: Endoscopy;  Laterality: N/A;   ECTOPIC PREGNANCY SURGERY     ENDOMETRIAL ABLATION     PORTACATH  PLACEMENT Left 11/04/2016   Procedure: INSERTION PORT-A-CATH;  Surgeon: Kieth Brightly, MD;  Location: [facility];  Service: General;  Laterality: Left;  Left subclavian vein   TUBAL LIGATION      FAMILY HISTORY :   Family History  Problem Relation Age of Onset   Heart disease Mother    Depression Father    Obesity Father    Diabetes Father    Alcohol abuse Father    COPD Father    Alcohol abuse Brother    Seizures Brother    Breast cancer Sister 76    SOCIAL HISTORY:   Social History   Tobacco Use   Smoking status: Former    Current packs/day: 0.00    Average packs/day: 0.5 packs/day for 20.0 years (10.0 ttl pk-yrs)    Types: Cigarettes    Start date: 08/23/1996    Quit date: 08/23/2016    Years since quitting: 6.7   Smokeless tobacco: Never  Vaping Use   Vaping status: Never Used  Substance Use Topics   Alcohol use: Yes    Alcohol/week: 0.0 standard drinks of alcohol    Comment: occassional   Drug use: No    ALLERGIES:  is allergic to adhesive [tape] and nickel.  MEDICATIONS:  Current Outpatient Medications  Medication Sig Dispense Refill   acetaminophen (TYLENOL) 325 MG tablet Take 325 mg by mouth every 6 (six) hours as needed for fever.     albuterol (VENTOLIN HFA) 108 (90 Base) MCG/ACT inhaler Inhale 1-2 puffs into the lungs every 6 (six) hours as needed for wheezing or shortness of breath. 1 g 0   amphetamine-dextroamphetamine (ADDERALL XR) 20 MG 24 hr capsule Take 1 capsule (20 mg total) by mouth daily. 30 capsule 0   amphetamine-dextroamphetamine (ADDERALL XR) 20 MG 24 hr capsule Take 1 capsule (20 mg total) by mouth daily. 30 capsule 0   amphetamine-dextroamphetamine (ADDERALL XR) 20 MG 24 hr capsule Take 1 capsule (20 mg total) by mouth daily. 30 capsule 0   dicyclomine (BENTYL) 10 MG capsule Take 1 capsule (10 mg total) by mouth 3 (three) times daily as needed for spasms. 30 capsule 0   Fluticasone-Umeclidin-Vilant (TRELEGY ELLIPTA) 100-62.5-25 MCG/ACT  AEPB Inhale 1 puff into the lungs daily. 1 each 2   IRON, FERROUS SULFATE, PO Take by mouth daily.     loratadine (CLARITIN) 10 MG tablet Take 1 tablet (10 mg total) by mouth daily. 30 tablet 2   meloxicam (MOBIC) 15 MG tablet Take 1 tablet (15 mg total) by mouth daily. 90 tablet 0   omeprazole (PRILOSEC) 20 MG capsule Take 1 capsule (20 mg total) by mouth daily. 90 capsule 1   zolpidem (AMBIEN) 5 MG tablet Take 1 tablet (5 mg total) by mouth at bedtime as needed for sleep. 90 tablet 0   busPIRone (BUSPAR) 5 MG tablet Take 1-2 tablets (5-10 mg total) by mouth daily as needed. (Patient not taking: Reported on 03/08/2023) 90 tablet 0   No current facility-administered medications for this visit.    PHYSICAL EXAMINATION: ECOG PERFORMANCE STATUS: 0 - Asymptomatic  BP (!) 123/50 (BP Location: Left Arm, Patient Position: Sitting, Cuff Size: Large)   Pulse 89   Temp (!) 96.5 F (35.8 C) (Tympanic)   Ht 5' 1.5" (1.562 m)   Wt 174 lb 9.6 oz (79.2 kg)   SpO2 96%   BMI 32.46 kg/m   Filed Weights   06/07/23 1339  Weight: 174 lb 9.6 oz (79.2 kg)      Physical Exam HENT:     Head: Normocephalic and atraumatic.     Mouth/Throat:     Pharynx: No oropharyngeal exudate.  Eyes:     Pupils: Pupils are equal, round, and reactive to light.  Cardiovascular:     Rate and Rhythm: Normal rate and regular rhythm.  Pulmonary:     Effort: No respiratory distress.     Breath sounds: No wheezing.  Abdominal:     General: Bowel sounds are normal. There is no distension.     Palpations: Abdomen is soft. There is no mass.     Tenderness: There is abdominal tenderness. There is no guarding or rebound.     Comments: Mild tenderness in the right upper quadrant.  Musculoskeletal:        General: No tenderness. Normal range of motion.     Cervical back: Normal range of motion and neck supple.  Skin:    General: Skin is warm.     Comments:    Neurological:     Mental Status: She is alert and oriented  to person, place, and time.  Psychiatric:        Mood and Affect: Affect normal.     LABORATORY DATA:  I have reviewed the data as listed    Component Value Date/Time   NA 137 06/07/2023 1326   NA 141 07/08/2022 1118   K 4.1 06/07/2023 1326   CL 105 06/07/2023 1326   CO2 25 06/07/2023 1326   GLUCOSE 119 (H) 06/07/2023 1326   BUN 14 06/07/2023 1326   BUN 17 07/08/2022 1118   CREATININE 0.83 06/07/2023 1326   CREATININE 0.74 11/20/2021 1346   CALCIUM 9.0 06/07/2023 1326   PROT 6.4 (L) 06/07/2023 1326   PROT 6.2 07/08/2022 1118   ALBUMIN 3.9 06/07/2023 1326   ALBUMIN 4.5 07/08/2022 1118   AST 20 06/07/2023 1326   ALT 23 06/07/2023 1326   ALKPHOS 88 06/07/2023 1326   BILITOT 0.2 (L) 06/07/2023 1326   GFRNONAA >60 06/07/2023 1326   GFRAA 108 02/22/2020 1331    No results found for: "SPEP", "UPEP"  Lab Results  Component Value Date   WBC 5.8 06/07/2023   NEUTROABS 3.2 06/07/2023   HGB 13.1 06/07/2023   HCT 40.4 06/07/2023   MCV 80.6 06/07/2023   PLT 264 06/07/2023      Chemistry      Component Value Date/Time   NA 137 06/07/2023 1326   NA 141 07/08/2022 1118   K 4.1 06/07/2023 1326   CL 105 06/07/2023 1326   CO2 25 06/07/2023 1326   BUN 14 06/07/2023 1326   BUN 17 07/08/2022 1118   CREATININE 0.83 06/07/2023 1326   CREATININE 0.74 11/20/2021 1346      Component Value Date/Time   CALCIUM 9.0 06/07/2023 1326   ALKPHOS 88 06/07/2023 1326   AST 20 06/07/2023 1326   ALT 23 06/07/2023 1326   BILITOT 0.2 (L) 06/07/2023 1326       RADIOGRAPHIC STUDIES: I have personally reviewed the radiological images as listed and agreed with the findings in  the report. No results found.   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   No evidence of any recurrence.  BIL mammo- DEC  2023- WNL.  Stable.   # Iron def Anemia- last colonoscopy- 2017 [Dr.Wohl]- no blood in stools. On PO iron-;- Awaiting appt AUG 2024- with Dr.Wohl- re EGD/colo   # Right UE lymphedema- G-1 stable.   #Osteopenia T score -1.9 [dec 2022]- stable; recommend compliance with  continue calcium plus vitamin; repeat BMD in 6 month.   # Anxiety- stable.   # DISPOSITION: # in dec 2024- Bil mammo/ BMD  # follow up in 6 months-MD; cbc/cmp; iron studies; ferritin; ca 27-29-- Dr.B   Orders Placed This Encounter  Procedures   MM 3D SCREENING MAMMOGRAM BILATERAL BREAST    Standing Status:   Future    Standing Expiration Date:   06/06/2024    Order Specific Question:   Reason for Exam (SYMPTOM  OR DIAGNOSIS REQUIRED)    Answer:   Breast Cancer    Order Specific Question:   Is the patient pregnant?    Answer:   No    Order Specific Question:   Preferred imaging location?    Answer:   [facility]   DG Bone Density    Standing Status:   Future    Standing Expiration Date:   06/06/2024    Order Specific Question:   Reason for Exam (SYMPTOM  OR DIAGNOSIS REQUIRED)    Answer:   Breast cancer    Order Specific Question:   Is patient pregnant?    Answer:   No    Order Specific Question:   Preferred imaging location?    Answer:   [facility]   CMP (Cancer Center only)    Standing Status:   Future    Standing Expiration Date:   06/06/2024   CBC with Differential (Cancer Center Only)    Standing Status:   Future    Standing Expiration Date:   06/06/2024   Iron and TIBC    Standing Status:   Future    Standing Expiration Date:   06/06/2024   Ferritin    Standing Status:   Future    Standing Expiration Date:   06/06/2024   Cancer antigen 27.29    Standing Status:   Future    Standing Expiration Date:   06/06/2024   All questions were answered. The patient knows to call the clinic with any problems, questions or concerns.      Earna Coder, MD 06/07/2023 2:52 PM

## 2023-06-08 LAB — CANCER ANTIGEN 27.29: CA 27.29: 10.9 U/mL (ref 0.0–38.6)

## 2023-06-28 ENCOUNTER — Ambulatory Visit: Payer: Managed Care, Other (non HMO) | Admitting: Gastroenterology

## 2023-06-28 ENCOUNTER — Encounter: Payer: Self-pay | Admitting: Gastroenterology

## 2023-06-28 ENCOUNTER — Other Ambulatory Visit: Payer: Self-pay

## 2023-06-28 ENCOUNTER — Encounter: Payer: Self-pay | Admitting: Internal Medicine

## 2023-06-28 VITALS — BP 128/77 | HR 108 | Temp 98.0°F | Ht 61.5 in | Wt 176.6 lb

## 2023-06-28 DIAGNOSIS — D508 Other iron deficiency anemias: Secondary | ICD-10-CM | POA: Diagnosis not present

## 2023-06-28 MED ORDER — NA SULFATE-K SULFATE-MG SULF 17.5-3.13-1.6 GM/177ML PO SOLN: 354.0000 mL | Freq: Once | ORAL | 0 refills | Status: AC

## 2023-06-28 NOTE — Progress Notes (Signed)
Wyline Mood MD, MRCP(U.K) [address]  [address]  [place], [place] [address]  Main: [phone]  Fax: [phone]   Primary Care Physician: Alba Cory, MD  Primary Gastroenterologist:  Dr. Wyline Mood   Chief Complaint  Patient presents with   Anemia    HPI: CHYANE CAMIRE is a 57 y.o. female Summary of history :   She sees Dr Donneta Romberg for breast cancer . S/p chemo .  Today being referred for iron deficiency anemia  Previously : 12/29/2021 for RUQ pain. 11/20/2021: LFT's normal. Hb 13.4 grams.  Right upper quadrant ultrasound in 08/11/2021 shows 2 small liver lesions most consistent with hemangioma.  Gallbladder polyps measuring 6 mm follow-up ultrasound in 6 months recommended    05/20/2016 : colonoscopy Normal repeat in 10 years.    She states that she has had right lower quadrant pain for many years.  Describes it as a sharp pain usually precipitated by food intake within 10 minutes and lasting for an hour to.  Nonradiating.  No other clear aggravating factors.  No clear relieving factors.  Occasionally having a bowel movement helps.  Denies any constipation.  Denies any regular NSAID use.  She did have issues with nausea and vomiting during her chemotherapy.  Presently not really an issue.  She takes omeprazole once a day before breakfast in the morning.  She has some issues with hemorrhoids.  States finds it hard to wipe after bowel movement.  Denies any bleeding.  Denies any perianal discomfort.  01/05/2022: RUQ USG: 5 mm gall bladder polyps- no follow up needed 01/08/2022: Ct abdomen : sigmoid diverticulosis At her last visit in April 2023 all the abdominal pain had resolved without any particular intervention   Interval history 03/04/2022-06/28/2023  She has been referred back to see [place] for iron deficiency anemia. 06/07/2023 ferritin 9 TIBC elevated hemoglobin was 11.1 g with oral iron went up to 13.1 g.   Denies any blood in her stool, urine from her nose  vaginal bleeding.  No change in bowel habits.  No abdominal pain.  Has been taking BC powders 1 every other day for a while.  No other complaints. Current Outpatient Medications  Medication Sig Dispense Refill   acetaminophen (TYLENOL) 325 MG tablet Take 325 mg by mouth every 6 (six) hours as needed for fever.     albuterol (VENTOLIN HFA) 108 (90 Base) MCG/ACT inhaler Inhale 1-2 puffs into the lungs every 6 (six) hours as needed for wheezing or shortness of breath. 1 g 0   amphetamine-dextroamphetamine (ADDERALL XR) 20 MG 24 hr capsule Take 1 capsule (20 mg total) by mouth daily. 30 capsule 0   amphetamine-dextroamphetamine (ADDERALL XR) 20 MG 24 hr capsule Take 1 capsule (20 mg total) by mouth daily. 30 capsule 0   amphetamine-dextroamphetamine (ADDERALL XR) 20 MG 24 hr capsule Take 1 capsule (20 mg total) by mouth daily. 30 capsule 0   busPIRone (BUSPAR) 5 MG tablet Take 1-2 tablets (5-10 mg total) by mouth daily as needed. 90 tablet 0   dicyclomine (BENTYL) 10 MG capsule Take 1 capsule (10 mg total) by mouth 3 (three) times daily as needed for spasms. 30 capsule 0   Fluticasone-Umeclidin-Vilant (TRELEGY ELLIPTA) 100-62.5-25 MCG/ACT AEPB Inhale 1 puff into the lungs daily. 1 each 2   IRON, FERROUS SULFATE, PO Take by mouth daily.     loratadine (CLARITIN) 10 MG tablet Take 1 tablet (10 mg total) by mouth daily. 30 tablet 2   meloxicam (  MOBIC) 15 MG tablet Take 1 tablet (15 mg total) by mouth daily. 90 tablet 0   omeprazole (PRILOSEC) 20 MG capsule Take 1 capsule (20 mg total) by mouth daily. 90 capsule 1   zolpidem (AMBIEN) 5 MG tablet Take 1 tablet (5 mg total) by mouth at bedtime as needed for sleep. 90 tablet 0   No current facility-administered medications for this visit.    Allergies as of 06/28/2023 - Review Complete 06/28/2023  Allergen Reaction Noted   Adhesive [tape] Other (See Comments) 10/01/2016   Nickel  10/06/2016       ROS:  General: Negative for anorexia, weight loss,  fever, chills, fatigue, weakness. ENT: Negative for hoarseness, difficulty swallowing , nasal congestion. CV: Negative for chest pain, angina, palpitations, dyspnea on exertion, peripheral edema.  Respiratory: Negative for dyspnea at rest, dyspnea on exertion, cough, sputum, wheezing.  GI: See history of present illness. GU:  Negative for dysuria, hematuria, urinary incontinence, urinary frequency, nocturnal urination.  Endo: Negative for unusual weight change.    Physical Examination:   BP 128/77   Pulse (!) 108   Temp 98 F (36.7 C) (Oral)   Ht 5' 1.5" (1.562 m)   Wt 176 lb 9.6 oz (80.1 kg)   BMI 32.83 kg/m   General: Well-nourished, well-developed in no acute distress.  Eyes: No icterus. Conjunctivae pink. Mouth: Oropharyngeal mucosa moist and pink , no lesions erythema or exudate. Lungs: Clear to auscultation bilaterally. Non-labored. Heart: Regular rate and rhythm, no murmurs rubs or gallops.  Abdomen: Bowel sounds are normal, nontender, nondistended, no hepatosplenomegaly or masses, no abdominal bruits or hernia , no rebound or guarding.   Extremities: No lower extremity edema. No clubbing or deformities. Neuro: Alert and oriented x 3.  Grossly intact. Skin: Warm and dry, no jaundice.   Psych: Alert and cooperative, normal mood and affect.   Imaging Studies: No results found.  Assessment and Plan:   MYSTIE DELHIERRO is a 57 y.o. y/o female here to see me as a follow-up for new onset iron deficiency anemia.  No overt blood loss.  Likely from long-term use of BC powders   Plan 1.  Check celiac serology urine analysis B12 folate H. pylori breath test. 2.  EGD and colonoscopy, if  negative capsule study of the small bowel if hemoglobin does not stay stable and iron studies do not return back to normal despite cessation of BC powder use 3.  Advised to stop long-term use of BC powder or other NSAIDs     Dr Wyline Mood  MD,MRCP [facility]) Follow up in 3 months with Celso Amy

## 2023-06-30 ENCOUNTER — Other Ambulatory Visit: Payer: Self-pay | Admitting: Family Medicine

## 2023-06-30 DIAGNOSIS — F339 Major depressive disorder, recurrent, unspecified: Secondary | ICD-10-CM

## 2023-07-06 ENCOUNTER — Other Ambulatory Visit: Payer: Self-pay | Admitting: Family Medicine

## 2023-07-06 DIAGNOSIS — M25511 Pain in right shoulder: Secondary | ICD-10-CM

## 2023-08-08 ENCOUNTER — Encounter: Payer: Self-pay | Admitting: Gastroenterology

## 2023-08-10 ENCOUNTER — Encounter
Admission: RE | Disposition: A | Discharge status: Home / Self Care | Payer: Self-pay | Source: Home / Self Care | Attending: Gastroenterology

## 2023-08-10 ENCOUNTER — Ambulatory Visit: Payer: Managed Care, Other (non HMO) | Admitting: Anesthesiology

## 2023-08-10 ENCOUNTER — Ambulatory Visit
Admission: RE | Admit: 2023-08-10 | Discharge: 2023-08-10 | Disposition: A | Discharge status: Home / Self Care | Payer: Managed Care, Other (non HMO) | Attending: Gastroenterology | Admitting: Gastroenterology

## 2023-08-10 ENCOUNTER — Encounter: Payer: Self-pay | Admitting: Gastroenterology

## 2023-08-10 DIAGNOSIS — Z853 Personal history of malignant neoplasm of breast: Secondary | ICD-10-CM | POA: Insufficient documentation

## 2023-08-10 DIAGNOSIS — K635 Polyp of colon: Secondary | ICD-10-CM

## 2023-08-10 DIAGNOSIS — K449 Diaphragmatic hernia without obstruction or gangrene: Secondary | ICD-10-CM | POA: Insufficient documentation

## 2023-08-10 DIAGNOSIS — Z87891 Personal history of nicotine dependence: Secondary | ICD-10-CM | POA: Diagnosis not present

## 2023-08-10 DIAGNOSIS — Z836 Family history of other diseases of the respiratory system: Secondary | ICD-10-CM | POA: Diagnosis not present

## 2023-08-10 DIAGNOSIS — K2100 Gastro-esophageal reflux disease with esophagitis, without bleeding: Secondary | ICD-10-CM | POA: Diagnosis not present

## 2023-08-10 DIAGNOSIS — Z803 Family history of malignant neoplasm of breast: Secondary | ICD-10-CM | POA: Insufficient documentation

## 2023-08-10 DIAGNOSIS — Z9079 Acquired absence of other genital organ(s): Secondary | ICD-10-CM | POA: Diagnosis not present

## 2023-08-10 DIAGNOSIS — D509 Iron deficiency anemia, unspecified: Secondary | ICD-10-CM | POA: Insufficient documentation

## 2023-08-10 DIAGNOSIS — D122 Benign neoplasm of ascending colon: Secondary | ICD-10-CM | POA: Insufficient documentation

## 2023-08-10 DIAGNOSIS — D649 Anemia, unspecified: Secondary | ICD-10-CM

## 2023-08-10 DIAGNOSIS — J449 Chronic obstructive pulmonary disease, unspecified: Secondary | ICD-10-CM | POA: Diagnosis not present

## 2023-08-10 DIAGNOSIS — K5730 Diverticulosis of large intestine without perforation or abscess without bleeding: Secondary | ICD-10-CM | POA: Diagnosis not present

## 2023-08-10 DIAGNOSIS — D508 Other iron deficiency anemias: Secondary | ICD-10-CM

## 2023-08-10 DIAGNOSIS — Z90710 Acquired absence of both cervix and uterus: Secondary | ICD-10-CM | POA: Diagnosis not present

## 2023-08-10 HISTORY — PX: POLYPECTOMY: SHX5525

## 2023-08-10 HISTORY — PX: BIOPSY: SHX5522

## 2023-08-10 HISTORY — PX: COLONOSCOPY WITH PROPOFOL: SHX5780

## 2023-08-10 HISTORY — PX: ESOPHAGOGASTRODUODENOSCOPY (EGD) WITH PROPOFOL: SHX5813

## 2023-08-10 SURGERY — COLONOSCOPY WITH PROPOFOL
Anesthesia: General

## 2023-08-10 MED ORDER — STERILE WATER FOR IRRIGATION IR SOLN
Status: DC | PRN
  Administered 2023-08-10: 50 mL
  Administered 2023-08-10: 150 mL

## 2023-08-10 MED ORDER — PROPOFOL 500 MG/50ML IV EMUL
INTRAVENOUS | Status: DC | PRN
  Administered 2023-08-10: 75 ug/kg/min via INTRAVENOUS

## 2023-08-10 MED ORDER — LIDOCAINE HCL (CARDIAC) PF 100 MG/5ML IV SOSY
PREFILLED_SYRINGE | INTRAVENOUS | Status: DC | PRN
  Administered 2023-08-10: 70 mg via INTRAVENOUS

## 2023-08-10 MED ORDER — SODIUM CHLORIDE 0.9 % IV SOLN: INTRAVENOUS | Status: DC

## 2023-08-10 MED ORDER — PROPOFOL 10 MG/ML IV BOLUS
INTRAVENOUS | Status: DC | PRN
  Administered 2023-08-10: 50 mg via INTRAVENOUS

## 2023-08-10 MED ORDER — DEXMEDETOMIDINE HCL IN NACL 200 MCG/50ML IV SOLN
INTRAVENOUS | Status: DC | PRN
Start: 2023-08-10 — End: 2023-08-10
  Administered 2023-08-10: 20 ug via INTRAVENOUS

## 2023-08-10 NOTE — Anesthesia Postprocedure Evaluation (Signed)
Anesthesia Post Note  [name]  Procedure(s) Performed: COLONOSCOPY WITH PROPOFOL ESOPHAGOGASTRODUODENOSCOPY (EGD) WITH PROPOFOL BIOPSY POLYPECTOMY  Patient location during evaluation: Endoscopy Anesthesia Type: General Level of consciousness: awake and alert Pain management: pain level controlled Vital Signs Assessment: post-procedure vital signs reviewed and stable Respiratory status: spontaneous breathing, nonlabored ventilation, respiratory function stable and patient connected to nasal cannula oxygen Cardiovascular status: blood pressure returned to baseline and stable Postop Assessment: no apparent nausea or vomiting Anesthetic complications: no   No notable events documented.   Last Vitals:  Vitals:   08/10/23 0949 08/10/23 1040  BP: 118/73 (!) 108/59  Pulse: 96 93  Resp: 16 16  Temp: (!) 36.2 C (!) 35.7 C  SpO2: 95% 96%    Last Pain:  Vitals:   08/10/23 1040  TempSrc: Temporal  PainSc: Asleep                 Louie Boston

## 2023-08-10 NOTE — Op Note (Signed)
[facility] Gastroenterology Patient Name: Ardalia Neumeister Procedure Date: 08/10/2023 9:59 AM MRN: [id number] Account #: [id number] Date of Birth: Mar 21, 1966 Admit Type: Outpatient Age: 57 Room: [facility] ENDO ROOM 3 Gender: Female Note Status: Finalized Instrument Name: Prentice Docker [id number] Procedure:             Colonoscopy Indications:           Iron deficiency anemia Providers:             Wyline Mood MD, MD Medicines:             Monitored Anesthesia Care Complications:         No immediate complications. Procedure:             Pre-Anesthesia Assessment:                        - Prior to the procedure, a History and Physical was                         performed, and patient medications, allergies and                         sensitivities were reviewed. The patient's tolerance                         of previous anesthesia was reviewed.                        - The risks and benefits of the procedure and the                         sedation options and risks were discussed with the                         patient. All questions were answered and informed                         consent was obtained.                        - ASA Grade Assessment: II - A patient with mild                         systemic disease.                        After obtaining informed consent, the colonoscope was                         passed under direct vision. Throughout the procedure,                         the patient's blood pressure, pulse, and oxygen                         saturations were monitored continuously. The                         Colonoscope was introduced through the anus and  advanced to the the cecum, identified by the                         appendiceal orifice. The colonoscopy was performed                         with ease. The patient tolerated the procedure well.                         The quality of the bowel preparation was good.                          Anatomical landmarks were photographed. Findings:      A 5 mm polyp was found in the ascending colon. The polyp was sessile.       The polyp was removed with a jumbo cold forceps. Resection and retrieval       were complete.      Multiple medium-mouthed diverticula were found in the left colon.      The exam was otherwise without abnormality on direct and retroflexion       views. Impression:            - One 5 mm polyp in the ascending colon, removed with                         a jumbo cold forceps. Resected and retrieved.                        - Diverticulosis in the left colon.                        - The examination was otherwise normal on direct and                         retroflexion views. Recommendation:        - Discharge patient to home (with escort).                        - Resume previous diet.                        - Continue present medications.                        - Await pathology results.                        - Repeat colonoscopy for surveillance based on                         pathology results.                        - Return to GI office as previously scheduled. Procedure Code(s):     --- Professional ---                        [phone], Colonoscopy, flexible; with biopsy, single or  multiple Diagnosis Code(s):     --- Professional ---                        D12.2, Benign neoplasm of ascending colon                        D50.9, Iron deficiency anemia, unspecified                        K57.30, Diverticulosis of large intestine without                         perforation or abscess without bleeding CPT copyright 2022 American Medical Association. All rights reserved. The codes documented in this report are preliminary and upon coder review may  be revised to meet current compliance requirements. Wyline Mood, MD Wyline Mood MD, MD 08/10/2023 10:37:16 AM This report has been signed electronically. Number of Addenda:  0 Note Initiated On: 08/10/2023 9:59 AM Scope Withdrawal Time: 0 hours 8 minutes 2 seconds  Total Procedure Duration: 0 hours 11 minutes 0 seconds  Estimated Blood Loss:  Estimated blood loss: none.      [facility]

## 2023-08-10 NOTE — Transfer of Care (Signed)
Immediate Anesthesia Transfer of Care Note  [name]  Procedure(s) Performed: COLONOSCOPY WITH PROPOFOL ESOPHAGOGASTRODUODENOSCOPY (EGD) WITH PROPOFOL BIOPSY POLYPECTOMY  Patient Location: PACU  Anesthesia Type:General  Level of Consciousness: sedated  Airway & Oxygen Therapy: Patient Spontanous Breathing and Patient connected to nasal cannula oxygen  Post-op Assessment: Report given to RN and Post -op Vital signs reviewed and stable  Post vital signs: Reviewed and stable  Last Vitals:  Vitals Value Taken Time  BP 108/59 08/10/23 1040  Temp    Pulse 93 08/10/23 1040  Resp 16 08/10/23 1040  SpO2 96 % 08/10/23 1040    Last Pain:  Vitals:   08/10/23 1040  TempSrc:   PainSc: Asleep         Complications: No notable events documented.

## 2023-08-10 NOTE — Op Note (Signed)
[facility] Gastroenterology Patient Name: Shelby Barry Procedure Date: 08/10/2023 10:00 AM MRN: [id number] Account #: [id number] Date of Birth: 1966-07-03 Admit Type: Outpatient Age: 57 Room: [facility] ENDO ROOM 3 Gender: Female Note Status: Finalized Instrument Name: Laurette Schimke [id number] Procedure:             Upper GI endoscopy Indications:           Iron deficiency anemia Providers:             Wyline Mood MD, MD Medicines:             Monitored Anesthesia Care Complications:         No immediate complications. Procedure:             Pre-Anesthesia Assessment:                        - Prior to the procedure, a History and Physical was                         performed, and patient medications, allergies and                         sensitivities were reviewed. The patient's tolerance                         of previous anesthesia was reviewed.                        - The risks and benefits of the procedure and the                         sedation options and risks were discussed with the                         patient. All questions were answered and informed                         consent was obtained.                        - ASA Grade Assessment: II - A patient with mild                         systemic disease.                        After obtaining informed consent, the endoscope was                         passed under direct vision. Throughout the procedure,                         the patient's blood pressure, pulse, and oxygen                         saturations were monitored continuously. The Endoscope                         was introduced through the mouth, and advanced to the  third part of duodenum. The upper GI endoscopy was                         accomplished with ease. The patient tolerated the                         procedure well. Findings:      LA Grade A (one or more mucosal breaks less than 5 mm, not extending        between tops of 2 mucosal folds) esophagitis with no bleeding was found       at the gastroesophageal junction.      A medium-sized hiatal hernia was present.      The entire examined stomach was normal.      The examined duodenum was normal. Biopsies for histology were taken with       a cold forceps for evaluation of celiac disease.      The cardia and gastric fundus were normal on retroflexion. Impression:            - LA Grade A reflux esophagitis with no bleeding.                        - Medium-sized hiatal hernia.                        - Normal stomach.                        - Normal examined duodenum. Biopsied. Recommendation:        - Await pathology results.                        - Perform a colonoscopy today. Procedure Code(s):     --- Professional ---                        [phone], Esophagogastroduodenoscopy, flexible,                         transoral; with biopsy, single or multiple Diagnosis Code(s):     --- Professional ---                        K21.00, Gastro-esophageal reflux disease with                         esophagitis, without bleeding                        K44.9, Diaphragmatic hernia without obstruction or                         gangrene                        D50.9, Iron deficiency anemia, unspecified CPT copyright 2022 American Medical Association. All rights reserved. The codes documented in this report are preliminary and upon coder review may  be revised to meet current compliance requirements. Wyline Mood, MD Wyline Mood MD, MD 08/10/2023 10:49:01 AM This report has been signed electronically. Number of Addenda: 0 Note Initiated On: 08/10/2023 10:00 AM Estimated Blood Loss:  Estimated blood loss: none.  [facility]

## 2023-08-10 NOTE — Anesthesia Preprocedure Evaluation (Addendum)
Anesthesia Evaluation  Patient identified by MRN, date of birth, ID band Patient awake    Reviewed: Allergy & Precautions, NPO status , Patient's Chart, lab work & pertinent test results  History of Anesthesia Complications Negative for: history of anesthetic complications  Airway Mallampati: III  TM Distance: >3 FB Neck ROM: full    Dental  (+) Upper Dentures, Lower Dentures   Pulmonary COPD,  COPD inhaler, former smoker   Pulmonary exam normal        Cardiovascular negative cardio ROS Normal cardiovascular exam     Neuro/Psych  PSYCHIATRIC DISORDERS Anxiety Depression    negative neurological ROS     GI/Hepatic Neg liver ROS,GERD  ,,  Endo/Other  negative endocrine ROS    Renal/GU negative Renal ROS  negative genitourinary   Musculoskeletal   Abdominal   Peds  Hematology  (+) Blood dyscrasia, anemia   Anesthesia Other Findings Past Medical History: No date: Anemia No date: Anxiety 09/2016: Breast cancer (HCC)     Comment:  right breast No date: Bursitis of right shoulder No date: Cancer (HCC)     Comment:  breast No date: 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,  estrogen receptor positive (HCC) No date: Cataract, bilateral No date: Chemotherapy induced nausea and vomiting No date: Cold     Comment:  states she has had it for 2 months No date: Depression No date: GERD (gastroesophageal reflux disease)     Comment:  uses baking soda for symptoms No date: Insomnia No date: Lumbago 11/22/2016: Personal history of chemotherapy 01/20/2017: Personal history of radiation therapy No date: Vitamin D deficiency  Past Surgical History: 04/20/2014: ABDOMINAL HYSTERECTOMY No date: BILATERAL SALPINGECTOMY 09/27/2016: BREAST BIOPSY; Right     Comment:  positive 10/12/2016: BREAST LUMPECTOMY; Right     Comment:  positive 10/12/2016: BREAST LUMPECTOMY WITH SENTINEL LYMPH NODE BIOPSY;  Right     Comment:  Procedure: BREAST LUMPECTOMY WITH SENTINEL LYMPH NODE               BX;  Surgeon: Kieth Brightly, MD;  Location: [facility]               ORS;  Service: General;  Laterality: Right; 05/20/2016: COLONOSCOPY WITH PROPOFOL; N/A     Comment:  Procedure: COLONOSCOPY WITH PROPOFOL;  Surgeon: Midge Minium, MD;  Location: [facility];  Service:               Endoscopy;  Laterality: N/A; No date: ECTOPIC PREGNANCY SURGERY No date: ENDOMETRIAL ABLATION 11/04/2016: PORTACATH PLACEMENT; Left     Comment:  Procedure: INSERTION PORT-A-CATH;  Surgeon: Kieth Brightly, MD;  Location: [facility] ORS;  Service: General;                Laterality: Left;  Left subclavian vein No date: TUBAL LIGATION     Reproductive/Obstetrics negative OB ROS                             Anesthesia Physical Anesthesia Plan  ASA: 2  Anesthesia Plan: General   Post-op Pain Management: Minimal or no pain anticipated   Induction: Intravenous  PONV Risk Score and Plan: 2 and Propofol infusion and TIVA  Airway Management Planned: Natural Airway and Nasal Cannula  Additional Equipment:  Intra-op Plan:   Post-operative Plan:   Informed Consent: I have reviewed the patients History and Physical, chart, labs and discussed the procedure including the risks, benefits and alternatives for the proposed anesthesia with the patient or authorized representative who has indicated his/her understanding and acceptance.     Dental Advisory Given  Plan Discussed with: Anesthesiologist, CRNA and Surgeon  Anesthesia Plan Comments: (Patient consented for risks of anesthesia including but not limited to:  - adverse reactions to medications - risk of airway placement if required - damage to eyes, teeth, lips or other oral mucosa - nerve damage due to positioning  - sore throat or hoarseness - Damage to heart, brain, nerves, lungs, other parts of  body or loss of life  Patient voiced understanding.)       Anesthesia Quick Evaluation

## 2023-08-10 NOTE — H&P (Signed)
Wyline Mood, MD [address], [place], [place], [address] [address], [place], [place], [address] Phone: [phone]  Fax: [phone]  Primary Care Physician:  Alba Cory, MD   Pre-Procedure History & Physical: HPI:  Shelby Barry is a 57 y.o. female is here for an endoscopy and colonoscopy    Past Medical History:  Diagnosis Date   Anemia    Anxiety    Breast cancer (HCC) 09/2016   right breast   Bursitis of right shoulder    Cancer (HCC)    breast   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, estrogen receptor positive (HCC)    Cataract, bilateral    Chemotherapy induced nausea and vomiting    Cold    states she has had it for 2 months   Depression    GERD (gastroesophageal reflux disease)    uses baking soda for symptoms   Insomnia    Lumbago    Personal history of chemotherapy 11/22/2016   Personal history of radiation therapy 01/20/2017   Vitamin D deficiency     Past Surgical History:  Procedure Laterality Date   ABDOMINAL HYSTERECTOMY  04/20/2014   BILATERAL SALPINGECTOMY     BREAST BIOPSY Right 09/27/2016   positive   BREAST LUMPECTOMY Right 10/12/2016   positive   BREAST LUMPECTOMY WITH SENTINEL LYMPH NODE BIOPSY Right 10/12/2016   Procedure: BREAST LUMPECTOMY WITH SENTINEL LYMPH NODE BX;  Surgeon: Kieth Brightly, MD;  Location: [facility] ORS;  Service: General;  Laterality: Right;   COLONOSCOPY WITH PROPOFOL N/A 05/20/2016   Procedure: COLONOSCOPY WITH PROPOFOL;  Surgeon: Midge Minium, MD;  Location: [facility];  Service: Endoscopy;  Laterality: N/A;   ECTOPIC PREGNANCY SURGERY     ENDOMETRIAL ABLATION     PORTACATH PLACEMENT Left 11/04/2016   Procedure: INSERTION PORT-A-CATH;  Surgeon: Kieth Brightly, MD;  Location: [facility] ORS;  Service: General;  Laterality: Left;  Left subclavian vein   TUBAL LIGATION      Prior to Admission medications   Medication Sig Start Date End Date  Taking? Authorizing Provider  acetaminophen (TYLENOL) 325 MG tablet Take 325 mg by mouth every 6 (six) hours as needed for fever.    [provider]  albuterol (VENTOLIN HFA) 108 (90 Base) MCG/ACT inhaler Inhale 1-2 puffs into the lungs every 6 (six) hours as needed for wheezing or shortness of breath. 10/17/21   Shirlee Latch, PA-C  amphetamine-dextroamphetamine (ADDERALL XR) 20 MG 24 hr capsule Take 1 capsule (20 mg total) by mouth daily. 05/05/23   Alba Cory, MD  amphetamine-dextroamphetamine (ADDERALL XR) 20 MG 24 hr capsule Take 1 capsule (20 mg total) by mouth daily. 05/05/23   Alba Cory, MD  amphetamine-dextroamphetamine (ADDERALL XR) 20 MG 24 hr capsule Take 1 capsule (20 mg total) by mouth daily. 05/05/23   Alba Cory, MD  Aspirin-Salicylamide-Caffeine (BC HEADACHE POWDER PO) Take by mouth as needed.    [provider]  busPIRone (BUSPAR) 5 MG tablet TAKE 1 TO 2 TABLETS BY MOUTH ONCE DAILY AS NEEDED 07/01/23   Carlynn Purl, Danna Hefty, MD  dicyclomine (BENTYL) 10 MG capsule Take 1 capsule (10 mg total) by mouth 3 (three) times daily as needed for spasms. 12/29/21   Wyline Mood, MD  Fluticasone-Umeclidin-Vilant (TRELEGY ELLIPTA) 100-62.5-25 MCG/ACT AEPB Inhale 1 puff into the lungs daily. 10/26/22   Alba Cory, MD  IRON, FERROUS SULFATE, PO Take by mouth daily.    [provider]  loratadine (CLARITIN) 10 MG tablet Take 1 tablet (10 mg total) by mouth daily. 02/27/20   Alba Cory, MD  meloxicam (MOBIC) 15 MG tablet TAKE 1 TABLET BY MOUTH DAILY 07/06/23   Alba Cory, MD  omeprazole (PRILOSEC) 20 MG capsule Take 1 capsule (20 mg total) by mouth daily. 05/05/23   Alba Cory, MD  zolpidem (AMBIEN) 5 MG tablet Take 1 tablet (5 mg total) by mouth at bedtime as needed for sleep. 02/01/23   Alba Cory, MD    Allergies as of 06/28/2023 - Review Complete 06/28/2023  Allergen Reaction Noted   Adhesive [tape] Other (See Comments) 10/01/2016   Nickel   10/06/2016    Family History  Problem Relation Age of Onset   Heart disease Mother    Depression Father    Obesity Father    Diabetes Father    Alcohol abuse Father    COPD Father    Alcohol abuse Brother    Seizures Brother    Breast cancer Sister 31    Social History   Socioeconomic History   Marital status: Significant Other    Spouse name: John   Number of children: 2   Years of education: Not on file   Highest education level: Associate degree: academic program  Occupational History   Occupation: Architectural technologist: [employer]  Tobacco Use   Smoking status: Former    Current packs/day: 0.00    Average packs/day: 0.5 packs/day for 20.0 years (10.0 ttl pk-yrs)    Types: Cigarettes    Start date: 08/23/1996    Quit date: 08/23/2016    Years since quitting: 6.9   Smokeless tobacco: Never  Vaping Use   Vaping status: Never Used  Substance and Sexual Activity   Alcohol use: Yes    Alcohol/week: 0.0 standard drinks of alcohol    Comment: occassional   Drug use: No   Sexual activity: Yes    Partners: Male  Other Topics Concern   Not on file  Social History Narrative   Broke up with boyfriend Jonny Ruiz Summer 2021 and is happy about it    Works at  [employer]: Low Risk  (11/09/2017)   Overall Financial Resource Strain (CARDIA)    Difficulty of Paying Living Expenses: Not hard at all  Food Insecurity: No Food Insecurity (11/09/2017)   Hunger Vital Sign    Worried About Running Out of Food in the Last Year: Never true    Ran Out of Food in the Last Year: Never true  Transportation Needs: No Transportation Needs (11/09/2017)   PRAPARE - Administrator, Civil Service (Medical): No    Lack of Transportation (Non-Medical): No  Physical Activity: Unknown (10/09/2019)   Exercise Vital Sign    Days of Exercise per Week: Not on file    Minutes of Exercise per Session: 30 min  Stress:  Stress Concern Present (11/09/2017)   [employer] of Occupational Health - Occupational Stress Questionnaire    Feeling of Stress : To some extent  Social Connections: Moderately Integrated (11/09/2017)   Social Connection and Isolation Panel [NHANES]    Frequency of Communication with Friends and Family: More than three times a week    Frequency of Social Gatherings with Friends and Family: More than three times a week    Attends Religious Services: 1 to 4 times per year    Active Member of [employer] or Organizations:  No    Attends Banker Meetings: Never    Marital Status: Living with partner  Intimate Partner Violence: Not At Risk (07/14/2020)   Humiliation, Afraid, Rape, and Kick questionnaire    Fear of Current or Ex-Partner: No    Emotionally Abused: No    Physically Abused: No    Sexually Abused: No    Review of Systems: See HPI, otherwise negative ROS  Physical Exam: There were no vitals taken for this visit. General:   Alert,  pleasant and cooperative in NAD Head:  Normocephalic and atraumatic. Neck:  Supple; no masses or thyromegaly. Lungs:  Clear throughout to auscultation, normal respiratory effort.    Heart:  +S1, +S2, Regular rate and rhythm, No edema. Abdomen:  Soft, nontender and nondistended. Normal bowel sounds, without guarding, and without rebound.   Neurologic:  Alert and  oriented x4;  grossly normal neurologically.  Impression/Plan: Shelby Barry is here for an endoscopy and colonoscopy  to be performed for  evaluation of anemia    Risks, benefits, limitations, and alternatives regarding endoscopy have been reviewed with the patient.  Questions have been answered.  All parties agreeable.   Wyline Mood, MD  08/10/2023, 9:08 AM

## 2023-08-11 ENCOUNTER — Encounter: Payer: Self-pay | Admitting: Gastroenterology

## 2023-08-11 LAB — SURGICAL PATHOLOGY

## 2023-08-16 ENCOUNTER — Telehealth: Payer: Self-pay

## 2023-08-16 NOTE — Telephone Encounter (Signed)
Patient called stating that she had procedures done on 08/10/2023 and after she went home, she started to have upper back pain (between her shoulder blades) and a cough. She stated that she had not fell, hit herself, or do a wrong movement to explain why she had been and continues to fell this way. Patient want the physician to know and what he recommends.

## 2023-08-17 NOTE — Telephone Encounter (Signed)
Called patient back but had to leave her a detailed message with the below information. I also let her know that if she had additional questions, to please call us back.

## 2023-08-18 ENCOUNTER — Other Ambulatory Visit: Payer: Self-pay | Admitting: Family Medicine

## 2023-08-18 DIAGNOSIS — F339 Major depressive disorder, recurrent, unspecified: Secondary | ICD-10-CM

## 2023-08-30 ENCOUNTER — Other Ambulatory Visit: Payer: Self-pay | Admitting: Family Medicine

## 2023-08-30 DIAGNOSIS — F5104 Psychophysiologic insomnia: Secondary | ICD-10-CM

## 2023-09-05 NOTE — Progress Notes (Unsigned)
Name: Shelby Barry   MRN: [id number]    DOB: Oct 05, 1966   Date:09/06/2023       Progress Note  Subjective  Chief Complaint  Follow Up  HPI  ADD combined type: she told me she always felt that she had ADD so we referred her for formal testing , she saw [facility] in [place] and the diagnosis was positive for ADD She states Adderal XR helps her focus, she feels less anxious when she takes medication and feels more accomplished . She skips doses when off work, she comes in every  4 months . Unchanged   Major depression chronic recurrent: she has a long history of depression over the years, her father committed suicide when she was in her 66's.  She placed her mother in the nursing home Cannot tolerate Lexapro, she was taking zoloft helped with symptoms but ran out medication and states only wants to take prn medication. She was really stressed since her grown son moved back in with her and her sister in the  Spring of 2023 . He is a Clinical research associate but has been behaving differently. Change in personality, hanging out with homeless people, he was giving all his belongings away. He is doing better now. She is living with her sister Only takes buspar prn only     History of breast cancer: still seeing hematologist, stopped anastrozole months ago, tried Tamoxifen for about 6 months but also stopped in 2019 on her own . She is up to date with mammogram, last mammogram was 12/23, during last visit with oncologist she was found to have iron deficiency anemia, she was seen by GI  had EGD and colonoscopy diagnosed with GERD / hiatal hernia and esophagitis and had one polyp. She is taking PPI    GERD: she is back on PPI daily , no pain but has esophagitis advised to reach out to Dr. Tobi Bastos    COPD with hyporesponsiveness also has a history of Asthma she was using Advair but stopped after an episode of thrush,, she tried Trelegy and stopped that also.  She quit smoking. She noticed increase in sob ,  cough and wheezing over the past week, but better today, she only wants to get a refill of albuterol   RUQ pain and nausea: happening when she eats, oncologist ordered US that showed two small gallbladder polyps , she saw GI and was given reassurance. She continues to vomit but she states it started after breast cancer treatment but stable, the vomiting but not as often.  Discussed referral to surgeon or HIDA scan but she wants to hold off for now   Hoarseness and otalgia: we will refer her to ENT  Intermittent shoulder pain: due to work - takes meloxicam at most a couple of times a month . 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, estrogen receptor positive (HCC) 10/26/2016   Major depression, recurrent, chronic (HCC) 08/22/2015   Bursitis of shoulder 08/22/2015   Bilateral cataracts 08/22/2015   Circadian rhythm sleep disorder, 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   Gastroesophageal reflux disease without esophagitis 08/22/2015  Vitamin D deficiency 08/22/2015   Low back pain 08/22/2015   GAD (generalized anxiety disorder) 08/22/2015   History of anemia 08/17/2007   Insomnia 08/16/2007    Past Surgical History:  Procedure Laterality Date   ABDOMINAL HYSTERECTOMY  04/20/2014   BILATERAL SALPINGECTOMY     BIOPSY  08/10/2023   Procedure: BIOPSY;  Surgeon: Wyline Mood, MD;  Location: [facility];  Service: Gastroenterology;;   BREAST BIOPSY Right 09/27/2016   positive   BREAST LUMPECTOMY Right 10/12/2016   positive   BREAST LUMPECTOMY WITH SENTINEL LYMPH NODE  BIOPSY Right 10/12/2016   Procedure: BREAST LUMPECTOMY WITH SENTINEL LYMPH NODE BX;  Surgeon: Kieth Brightly, MD;  Location: [facility] ORS;  Service: General;  Laterality: Right;   COLONOSCOPY WITH PROPOFOL N/A 05/20/2016   Procedure: COLONOSCOPY WITH PROPOFOL;  Surgeon: Midge Minium, MD;  Location: [facility];  Service: Endoscopy;  Laterality: N/A;   COLONOSCOPY WITH PROPOFOL N/A 08/10/2023   Procedure: COLONOSCOPY WITH PROPOFOL;  Surgeon: Wyline Mood, MD;  Location[facility] ENDOSCOPY;  Service: Gastroenterology;  Laterality: N/A;   ECTOPIC PREGNANCY SURGERY     ENDOMETRIAL ABLATION     ESOPHAGOGASTRODUODENOSCOPY (EGD) WITH PROPOFOL N/A 08/10/2023   Procedure: ESOPHAGOGASTRODUODENOSCOPY (EGD) WITH PROPOFOL;  Surgeon: Wyline Mood, MD;  Location: [facility] ENDOSCOPY;  Service: Gastroenterology;  Laterality: N/A;   POLYPECTOMY  08/10/2023   Procedure: POLYPECTOMY;  Surgeon: Wyline Mood, MD;  Location: [facility] ENDOSCOPY;  Service: Gastroenterology;;   PORTACATH PLACEMENT Left 11/04/2016   Procedure: INSERTION PORT-A-CATH;  Surgeon: Kieth Brightly, MD;  Location: [facility] ORS;  Service: General;  Laterality: Left;  Left subclavian vein   TUBAL LIGATION      Family History  Problem Relation Age of Onset   Heart disease Mother    Depression Father    Obesity Father    Diabetes Father    Alcohol abuse Father    COPD Father    Alcohol abuse Brother    Seizures Brother    Breast cancer Sister 61    Social History   Tobacco Use   Smoking status: Former    Current packs/day: 0.00    Average packs/day: 0.5 packs/day for 20.0 years (10.0 ttl pk-yrs)    Types: Cigarettes    Start date: 08/23/1996    Quit date: 08/23/2016    Years since quitting: 7.0   Smokeless tobacco: Never  Substance Use Topics   Alcohol use: Yes    Alcohol/week: 0.0 standard drinks of alcohol    Comment: occassional     Current Outpatient Medications:    acetaminophen (TYLENOL) 325 MG tablet, Take 325 mg by mouth  every 6 (six) hours as needed for fever., Disp: , Rfl:    Aspirin-Salicylamide-Caffeine (BC HEADACHE POWDER PO), Take by mouth as needed., Disp: , Rfl:    busPIRone (BUSPAR) 5 MG tablet, TAKE 1 TO 2 TABLETS BY MOUTH ONCE DAILY AS NEEDED, Disp: 60 tablet, Rfl: 0   dicyclomine (BENTYL) 10 MG capsule, Take 1 capsule (10 mg total) by mouth 3 (three) times daily as needed for spasms., Disp: 30 capsule, Rfl: 0   IRON, FERROUS SULFATE, PO, Take by mouth daily., Disp: , Rfl:    loratadine (CLARITIN) 10 MG tablet, Take 1 tablet (10 mg total) by mouth daily., Disp: 30 tablet, Rfl: 2   meloxicam (MOBIC) 15 MG tablet, TAKE 1 TABLET BY MOUTH DAILY, Disp: 90 tablet, Rfl: 0   omeprazole (PRILOSEC) 20 MG capsule, Take 1 capsule (20 mg total) by mouth daily., Disp: 90  capsule, Rfl: 1   zolpidem (AMBIEN) 5 MG tablet, TAKE 1 TABLET BY MOUTH AT BEDTIME AS NEEDED FOR SLEEP, Disp: 30 tablet, Rfl: 0   albuterol (VENTOLIN HFA) 108 (90 Base) MCG/ACT inhaler, Inhale 1-2 puffs into the lungs every 6 (six) hours as needed for wheezing or shortness of breath., Disp: 18 g, Rfl: 0   amphetamine-dextroamphetamine (ADDERALL XR) 20 MG 24 hr capsule, Take 1 capsule (20 mg total) by mouth daily., Disp: 30 capsule, Rfl: 0   amphetamine-dextroamphetamine (ADDERALL XR) 20 MG 24 hr capsule, Take 1 capsule (20 mg total) by mouth daily., Disp: 30 capsule, Rfl: 0   amphetamine-dextroamphetamine (ADDERALL XR) 20 MG 24 hr capsule, Take 1 capsule (20 mg total) by mouth daily., Disp: 30 capsule, Rfl: 0  Allergies  Allergen Reactions   Adhesive [Tape] Other (See Comments)    Blisters/rash  PLEASE USE PAPER TAPE ONLY   Nickel     PT CAN WEAR GOLD, STERLING SILVER WITH NO PROBLEM-PT HAS NEVER HAD ANY PROBLEMS IN THE OR    I personally reviewed active problem list, medication list, allergies, family history, social history, health maintenance with the patient/caregiver today.   ROS  Ten systems reviewed and is negative except as  mentioned in HPI    Objective  Vitals:   09/06/23 1057  BP: 122/74  Pulse: 92  Resp: 12  Temp: 98 F (36.7 C)  TempSrc: Oral  SpO2: 95%  Weight: 178 lb 8 oz (81 kg)  Height: 5' 1.5" (1.562 m)    Body mass index is 33.18 kg/m.  Physical Exam  Constitutional: Patient appears well-developed and well-nourished. Obese  No distress.  HEENT: head atraumatic, normocephalic, pupils equal and reactive to light, neck supple Cardiovascular: Normal rate, regular rhythm and normal heart sounds.  No murmur heard. No BLE edema. Pulmonary/Chest: Effort normal , scattered wheezing  No respiratory distress. Abdominal: Soft.  There is no tenderness. Psychiatric: Patient has a normal mood and affect. behavior is normal. Judgment and thought content normal.    PHQ2/9:    09/06/2023   11:00 AM 05/05/2023   11:49 AM 02/01/2023   10:08 AM 10/26/2022   10:36 AM 07/08/2022    9:53 AM  Depression screen PHQ 2/9  Decreased Interest 0 1 1 0 0  Down, Depressed, Hopeless 1 1 1  0 0  PHQ - 2 Score 1 2 2  0 0  Altered sleeping 3 1 1  0 0  Tired, decreased energy 2 0 1 0 0  Change in appetite 0 0 0 0 0  Feeling bad or failure about yourself  0 0 1 0 0  Trouble concentrating 0 0 1 0 0  Moving slowly or fidgety/restless 0 0 0 0 0  Suicidal thoughts 0 0 0 0 0  PHQ-9 Score 6 3 6  0 0  Difficult doing work/chores Somewhat difficult Not difficult at all Not difficult at all  Not difficult at all    phq 9 is negative   Fall Risk:    09/06/2023   10:58 AM 05/05/2023   11:48 AM 02/01/2023   10:02 AM 10/26/2022   10:35 AM 07/08/2022    9:52 AM  Fall Risk   Falls in the past year? 1 0  0 0  Number falls in past yr: 0 0  0 0  Injury with Fall? 0 0  0 0  Risk for fall due to : History of fall(s)  No Fall Risks No Fall Risks No Fall Risks  Follow up  Falls prevention discussed;Education provided;Falls evaluation completed  Falls prevention discussed Falls prevention discussed Falls prevention discussed       Assessment & Plan  1. COPD with asthma (HCC)  - predniSONE (DELTASONE) 10 MG tablet; Take 1 tablet (10 mg total) by mouth 2 (two) times daily with a meal.  Dispense: 10 tablet; Refill: 0  2. GERD without esophagitis  Under the care of Dr. Tobi Bastos  3. Hoarseness  - Ambulatory referral to ENT  4. Major depression, recurrent, chronic (HCC)  Stable   5. Otalgia of both ears  - Ambulatory referral to ENT  6. Psychophysiological insomnia  On ambien prn   7. Attention deficit hyperactivity disorder (ADHD), combined type  - amphetamine-dextroamphetamine (ADDERALL XR) 20 MG 24 hr capsule; Take 1 capsule (20 mg total) by mouth daily.  Dispense: 30 capsule; Refill: 0 - amphetamine-dextroamphetamine (ADDERALL XR) 20 MG 24 hr capsule; Take 1 capsule (20 mg total) by mouth daily.  Dispense: 30 capsule; Refill: 0 - amphetamine-dextroamphetamine (ADDERALL XR) 20 MG 24 hr capsule; Take 1 capsule (20 mg total) by mouth daily.  Dispense: 30 capsule; Refill: 0  8. Vitamin D deficiency  Taking  vitamin D   9. History of right breast cancer  Keep appointment with oncologist

## 2023-09-06 ENCOUNTER — Encounter: Payer: Self-pay | Admitting: Family Medicine

## 2023-09-06 ENCOUNTER — Ambulatory Visit: Payer: Managed Care, Other (non HMO) | Admitting: Family Medicine

## 2023-09-06 VITALS — BP 122/74 | HR 92 | Temp 98.0°F | Resp 12 | Ht 61.5 in | Wt 178.5 lb

## 2023-09-06 DIAGNOSIS — K219 Gastro-esophageal reflux disease without esophagitis: Secondary | ICD-10-CM

## 2023-09-06 DIAGNOSIS — F339 Major depressive disorder, recurrent, unspecified: Secondary | ICD-10-CM

## 2023-09-06 DIAGNOSIS — J4489 Other specified chronic obstructive pulmonary disease: Secondary | ICD-10-CM

## 2023-09-06 DIAGNOSIS — R490 Dysphonia: Secondary | ICD-10-CM

## 2023-09-06 DIAGNOSIS — E559 Vitamin D deficiency, unspecified: Secondary | ICD-10-CM

## 2023-09-06 DIAGNOSIS — H9203 Otalgia, bilateral: Secondary | ICD-10-CM

## 2023-09-06 DIAGNOSIS — F902 Attention-deficit hyperactivity disorder, combined type: Secondary | ICD-10-CM

## 2023-09-06 DIAGNOSIS — F5104 Psychophysiologic insomnia: Secondary | ICD-10-CM

## 2023-09-06 DIAGNOSIS — Z853 Personal history of malignant neoplasm of breast: Secondary | ICD-10-CM

## 2023-09-06 MED ORDER — AMPHETAMINE-DEXTROAMPHET ER 20 MG PO CP24
20.0000 mg | ORAL_CAPSULE | Freq: Every day | ORAL | 0 refills | Status: DC
Start: 2023-09-06 — End: 2024-01-16

## 2023-09-06 MED ORDER — PREDNISONE 10 MG PO TABS
10.0000 mg | ORAL_TABLET | Freq: Two times a day (BID) | ORAL | 0 refills | Status: DC
Start: 2023-09-06 — End: 2023-11-28

## 2023-09-06 MED ORDER — ALBUTEROL SULFATE HFA 108 (90 BASE) MCG/ACT IN AERS: 1.0000 | INHALATION_SPRAY | Freq: Four times a day (QID) | RESPIRATORY_TRACT | 0 refills | Status: DC | PRN

## 2023-09-08 ENCOUNTER — Other Ambulatory Visit: Payer: Self-pay | Admitting: Student

## 2023-09-08 DIAGNOSIS — R490 Dysphonia: Secondary | ICD-10-CM

## 2023-09-13 ENCOUNTER — Inpatient Hospital Stay: Admission: RE | Admit: 2023-09-13 | Payer: Managed Care, Other (non HMO) | Source: Ambulatory Visit

## 2023-09-13 ENCOUNTER — Encounter: Payer: Self-pay | Admitting: Internal Medicine

## 2023-09-19 ENCOUNTER — Other Ambulatory Visit: Payer: Self-pay | Admitting: Family Medicine

## 2023-09-19 DIAGNOSIS — K219 Gastro-esophageal reflux disease without esophagitis: Secondary | ICD-10-CM

## 2023-09-21 ENCOUNTER — Encounter: Payer: Self-pay | Admitting: Internal Medicine

## 2023-09-21 ENCOUNTER — Ambulatory Visit
Admission: RE | Admit: 2023-09-21 | Discharge: 2023-09-21 | Disposition: A | Discharge status: Home / Self Care | Payer: Managed Care, Other (non HMO) | Source: Ambulatory Visit | Attending: Student | Admitting: Student

## 2023-09-21 DIAGNOSIS — R490 Dysphonia: Secondary | ICD-10-CM

## 2023-09-21 MED ORDER — IOPAMIDOL (ISOVUE-300) INJECTION 61%
75.0000 mL | Freq: Once | INTRAVENOUS | Status: AC | PRN
  Administered 2023-09-21: 75 mL via INTRAVENOUS

## 2023-09-29 ENCOUNTER — Other Ambulatory Visit: Payer: Self-pay

## 2023-09-29 ENCOUNTER — Encounter: Payer: Self-pay | Admitting: Gastroenterology

## 2023-09-29 ENCOUNTER — Ambulatory Visit: Payer: Managed Care, Other (non HMO) | Admitting: Gastroenterology

## 2023-09-29 VITALS — BP 128/77 | HR 114 | Temp 98.3°F | Wt 170.6 lb

## 2023-09-29 DIAGNOSIS — D509 Iron deficiency anemia, unspecified: Secondary | ICD-10-CM

## 2023-09-29 DIAGNOSIS — D508 Other iron deficiency anemias: Secondary | ICD-10-CM

## 2023-09-29 NOTE — Progress Notes (Signed)
Wyline Mood MD, MRCP(U.K) [address]  [address]  [place], [place] [address]  Main: [phone]  Fax: [phone]   Primary Care Physician: Alba Cory, MD  Primary Gastroenterologist:  Dr. Wyline Mood   Chief Complaint  Patient presents with   Anemia    HPI: Shelby Barry is a 57 y.o. female  Summary of history :  She sees Dr Donneta Romberg for breast cancer . S/p chemo .  Here to follow up for iron deficiency anemia last seen in 06/2023   Previously : 12/29/2021 for RUQ pain. 11/20/2021: LFT's normal. Hb 13.4 grams.  Right upper quadrant ultrasound in 08/11/2021 shows 2 small liver lesions most consistent with hemangioma.  Gallbladder polyps measuring 6 mm follow-up ultrasound in 6 months recommended    05/20/2016 : colonoscopy Normal repeat in 10 years.    She has had some issues with hemorrhoids.  States finds it hard to wipe after bowel movement.  Denies any bleeding.  Denies any perianal discomfort.   01/05/2022: RUQ USG: 5 mm gall bladder polyps- no follow up needed 01/08/2022: Ct abdomen : sigmoid diverticulosis At her last visit in April 2023 all the abdominal pain had resolved without any particular intervention  06/07/2023 ferritin 9 TIBC elevated hemoglobin was 11.1 g with oral iron went up to 13.1 g.    Interval history 06/28/2023-09/29/2023   Did not obtain labs ordered last visit   07/20/2023: Hb 12.6 grams 08/10/2023: EGD: medium sized hiatal hernia and LA grade A esophagitis,colonoscopy showed 5 mm polyp and diverticulosis of the colon    She is doing well.  Stopped all use of NSAIDs.  On PPI daily if she misses a dose has breakthrough heartburn.  She has known that she has had a hiatal hernia for many years no other complaints presently.     Current Outpatient Medications  Medication Sig Dispense Refill   acetaminophen (TYLENOL) 325 MG tablet Take 325 mg by mouth every 6 (six) hours as needed for fever.     albuterol (VENTOLIN HFA) 108 (90 Base)  MCG/ACT inhaler Inhale 1-2 puffs into the lungs every 6 (six) hours as needed for wheezing or shortness of breath. 18 g 0   amphetamine-dextroamphetamine (ADDERALL XR) 20 MG 24 hr capsule Take 1 capsule (20 mg total) by mouth daily. 30 capsule 0   amphetamine-dextroamphetamine (ADDERALL XR) 20 MG 24 hr capsule Take 1 capsule (20 mg total) by mouth daily. 30 capsule 0   amphetamine-dextroamphetamine (ADDERALL XR) 20 MG 24 hr capsule Take 1 capsule (20 mg total) by mouth daily. 30 capsule 0   Aspirin-Salicylamide-Caffeine (BC HEADACHE POWDER PO) Take by mouth as needed.     busPIRone (BUSPAR) 5 MG tablet TAKE 1 TO 2 TABLETS BY MOUTH ONCE DAILY AS NEEDED 60 tablet 0   dicyclomine (BENTYL) 10 MG capsule Take 1 capsule (10 mg total) by mouth 3 (three) times daily as needed for spasms. 30 capsule 0   IRON, FERROUS SULFATE, PO Take by mouth daily.     loratadine (CLARITIN) 10 MG tablet Take 1 tablet (10 mg total) by mouth daily. 30 tablet 2   meloxicam (MOBIC) 15 MG tablet TAKE 1 TABLET BY MOUTH DAILY 90 tablet 0   omeprazole (PRILOSEC) 20 MG capsule Take 1 capsule (20 mg total) by mouth daily. 90 capsule 1   predniSONE (DELTASONE) 10 MG tablet Take 1 tablet (10 mg total) by mouth 2 (two) times daily with a meal. 10 tablet 0   zolpidem (AMBIEN)  5 MG tablet TAKE 1 TABLET BY MOUTH AT BEDTIME AS NEEDED FOR SLEEP 30 tablet 0   No current facility-administered medications for this visit.    Allergies as of 09/29/2023 - Review Complete 09/29/2023  Allergen Reaction Noted   Adhesive [tape] Other (See Comments) 10/01/2016   Nickel  10/06/2016       ROS:  General: Negative for anorexia, weight loss, fever, chills, fatigue, weakness. ENT: Negative for hoarseness, difficulty swallowing , nasal congestion. CV: Negative for chest pain, angina, palpitations, dyspnea on exertion, peripheral edema.  Respiratory: Negative for dyspnea at rest, dyspnea on exertion, cough, sputum, wheezing.  GI: See history of  present illness. GU:  Negative for dysuria, hematuria, urinary incontinence, urinary frequency, nocturnal urination.  Endo: Negative for unusual weight change.    Physical Examination:   BP 128/77   Pulse (!) 114   Temp 98.3 F (36.8 C) (Oral)   Wt 170 lb 9.6 oz (77.4 kg)   BMI 31.71 kg/m   General: Well-nourished, well-developed in no acute distress.  Eyes: No icterus. Conjunctivae pink. Neuro: Alert and oriented x 3.  Grossly intact. Skin: Warm and dry, no jaundice.   Psych: Alert and cooperative, normal mood and affect.   Imaging Studies: CT SOFT TISSUE NECK W CONTRAST  Result Date: 09/27/2023 CLINICAL DATA:  Dysphonia, hoarseness, ear pain. EXAM: CT NECK WITH CONTRAST TECHNIQUE: Multidetector CT imaging of the neck was performed using the standard protocol following the bolus administration of intravenous contrast. RADIATION DOSE REDUCTION: This exam was performed according to the departmental dose-optimization program which includes automated exposure control, adjustment of the mA and/or kV according to patient size and/or use of iterative reconstruction technique. CONTRAST:  75mL ISOVUE-300 IOPAMIDOL (ISOVUE-300) INJECTION 61% COMPARISON:  None Available. FINDINGS: Pharynx and larynx: The nasal cavity and nasopharynx are unremarkable. The oral cavity and oropharynx are unremarkable. The parapharyngeal spaces are clear. The hypopharynx and larynx are unremarkable. The vocal folds are normal in appearance. There is no evidence of vocal cord paralysis. The epiglottis is normal. There is no retropharyngeal collection. The airway is patent. Salivary glands: The parotid and submandibular glands are unremarkable. Thyroid: A subcentimeter right thyroid nodule is noted requiring no specific imaging follow-up. Lymph nodes: There is no pathologic lymphadenopathy in the neck. A subcentimeter pretracheal lymph node in the upper mediastinum is not significantly changed since 2017, likely  benign/reactive. Vascular: There is mild plaque at the carotid bifurcations. The major vasculature of the neck is otherwise unremarkable. Limited intracranial: Unremarkable. Visualized orbits: Bilateral lens implants are in place. The globes and orbits are otherwise unremarkable. Mastoids and visualized paranasal sinuses: Clear. Skeleton: There is no acute osseous abnormality or suspicious osseous lesion. Upper chest: There is scarring in the right lung apex. Other: None. IMPRESSION: No acute or suspicious finding in the neck. Normal appearance of the aerodigestive tract with no evidence of vocal cord paralysis. Electronically Signed   By: Lesia Hausen M.D.   On: 09/27/2023 13:25    Assessment and Plan:   Shelby Barry is a 57 y.o. y/o female here to see me as a follow-up for new onset iron deficiency anemia.  No overt blood loss.  Likely from long-term use of BC powders, hiatal hernia      Plan 1.  Advised to stop long-term use of BC powder or other NSAIDs 2. Follow up with PCP and if has further anemia to come back to see [place] for possible capsule study of the small bowel  Dr Wyline Mood  MD,MRCP [facility]) Follow up in as needed

## 2023-10-02 ENCOUNTER — Other Ambulatory Visit: Payer: Self-pay | Admitting: Family Medicine

## 2023-10-02 DIAGNOSIS — F339 Major depressive disorder, recurrent, unspecified: Secondary | ICD-10-CM

## 2023-10-04 ENCOUNTER — Other Ambulatory Visit: Payer: Self-pay | Admitting: Family Medicine

## 2023-10-04 DIAGNOSIS — F5104 Psychophysiologic insomnia: Secondary | ICD-10-CM

## 2023-10-14 ENCOUNTER — Other Ambulatory Visit: Payer: Self-pay | Admitting: Family Medicine

## 2023-10-14 NOTE — Telephone Encounter (Signed)
Medication Refill -  Most Recent Primary Care Visit:  Provider: Alba Cory  Department: [facility]  Visit Type: OFFICE VISIT  Date: 09/06/2023  Medication: albuterol (VENTOLIN HFA) 108 (90 Base) MCG/ACT inhaler    Has the patient contacted their pharmacy? Yes No refills   Is this the correct pharmacy for this prescription? Yes  This is the patient's preferred pharmacy: [facility] [address], [place] - [address]  Phone: [phone] Fax: [phone]   Has the prescription been filled recently? Yes   Is the patient out of the medication? Yes   Has the patient been seen for an appointment in the last year OR does the patient have an upcoming appointment? Yes  Can we respond through MyChart? No   Agent: Please be advised that Rx refills may take up to 3 business days. We ask that you follow-up with your pharmacy.

## 2023-10-14 NOTE — Telephone Encounter (Signed)
Medication Refill -  Most Recent Primary Care Visit:  Provider: Alba Cory  Department: [facility]  Visit Type: OFFICE VISIT  Date: 09/06/2023  Medication: albuterol (VENTOLIN HFA) 108 (90 Base) MCG/ACT inhaler   Has the patient contacted their pharmacy? Yes No refills  Is this the correct pharmacy for this prescription? Yes If no, delete pharmacy and type the correct one.  This is the patient's preferred pharmacy:   Has the prescription been filled recently? Yes  Is the patient out of the medication? Yes  Has the patient been seen for an appointment in the last year OR does the patient have an upcoming appointment? Yes  Can we respond through MyChart? No  Agent: Please be advised that Rx refills may take up to 3 business days. We ask that you follow-up with your pharmacy.

## 2023-10-16 DIAGNOSIS — I5020 Unspecified systolic (congestive) heart failure: Secondary | ICD-10-CM | POA: Insufficient documentation

## 2023-10-17 MED ORDER — ALBUTEROL SULFATE HFA 108 (90 BASE) MCG/ACT IN AERS: 1.0000 | INHALATION_SPRAY | Freq: Four times a day (QID) | RESPIRATORY_TRACT | 0 refills | Status: DC | PRN

## 2023-10-17 NOTE — Telephone Encounter (Signed)
Requested Prescriptions  Pending Prescriptions Disp Refills   albuterol (VENTOLIN HFA) 108 (90 Base) MCG/ACT inhaler 18 g 0    Sig: Inhale 1-2 puffs into the lungs every 6 (six) hours as needed for wheezing or shortness of breath.     Pulmonology:  Beta Agonists 2 Failed - 10/14/2023  4:07 PM      Failed - Last Heart Rate in normal range    Pulse Readings from Last 1 Encounters:  09/29/23 (!) 114         Passed - Last BP in normal range    BP Readings from Last 1 Encounters:  09/29/23 128/77         Passed - Valid encounter within last 12 months    Recent Outpatient Visits           1 month ago COPD with asthma [facility])   [place] [facility] Alba Cory, MD   5 months ago COPD with bronchial hyperresponsiveness [facility])   [place] [facility] Alba Cory, MD   8 months ago Major depression, recurrent, chronic [facility])   [place] [facility] Alba Cory, MD   11 months ago Major depression, recurrent, chronic [facility])   [place] [facility] Alba Cory, MD   1 year ago Encounter for biometric screening   [place] [facility] Danelle Berry, [place]       Future Appointments             In 2 months Alba Cory, MD [facility]

## 2023-10-21 ENCOUNTER — Telehealth: Payer: Self-pay

## 2023-10-21 NOTE — Transitions of Care (Post Inpatient/ED Visit) (Signed)
10/21/2023  Name: Shelby Barry MRN: [id number] DOB: March 21, 1966  Today's TOC FU Call Status: Today's TOC FU Call Status:: Successful TOC FU Call Completed TOC FU Call Complete Date: 10/21/23 Patient's Name and Date of Birth confirmed.  Transition Care Management Follow-up Telephone Call Date of Discharge: 10/20/23 Discharge Facility: Other [facility]) Name of Other (Non[facility]) Discharge Facility: [facility] Type of Discharge: Inpatient Admission Primary Inpatient Discharge Diagnosis:: CHF How have you been since you were released from the hospital?: Better Any questions or concerns?: No  Items Reviewed: Did you receive and understand the discharge instructions provided?: Yes Any new allergies since your discharge?: No Dietary orders reviewed?: Yes Type of Diet Ordered:: Reg Heart Healthy Do you have support at home?: Yes People in Home: child(ren), adult, sibling(s) Name of Support/Comfort Primary Source: Son Cristal Deer, Sister- Tammy, Dog Buster  Medications Reviewed Today: Medications Reviewed Today     Reviewed by Johnnette Barrios, RN (Registered Nurse) on 10/21/23 at 1017  Med List Status: <None>   Medication Order Taking? Sig Documenting Provider Last Dose Status Informant  acetaminophen (TYLENOL) 325 MG tablet [id number] Yes Take 325 mg by mouth every 6 (six) hours as needed for fever. [provider] Taking Active   albuterol (VENTOLIN HFA) 108 (90 Base) MCG/ACT inhaler [id number] Yes Inhale 1-2 puffs into the lungs every 6 (six) hours as needed for wheezing or shortness of breath. Alba Cory, MD Taking Active   amphetamine-dextroamphetamine (ADDERALL XR) 20 MG 24 hr capsule [id number] Yes Take 1 capsule (20 mg total) by mouth daily. Alba Cory, MD Taking Active   amphetamine-dextroamphetamine (ADDERALL XR) 20 MG 24 hr capsule [id number]  Take 1 capsule (20 mg total) by mouth daily. Alba Cory, MD  Active   amphetamine-dextroamphetamine  (ADDERALL XR) 20 MG 24 hr capsule [id number]  Take 1 capsule (20 mg total) by mouth daily. Alba Cory, MD  Active   Aspirin-Salicylamide-Caffeine [facility] HEADACHE POWDER PO) [id number] Yes Take by mouth as needed. [provider] Taking Active   busPIRone (BUSPAR) 5 MG tablet [id number] Yes TAKE 1 TO 2 TABLETS BY MOUTH ONCE DAILY AS NEEDED Carlynn Purl, Danna Hefty, MD Taking Active   dicyclomine (BENTYL) 10 MG capsule [id number] Yes Take 1 capsule (10 mg total) by mouth 3 (three) times daily as needed for spasms. Wyline Mood, MD Taking Active   empagliflozin (JARDIANCE) 10 MG TABS tablet [id number] Yes Take 10 mg by mouth daily. [provider] Taking Active   furosemide (LASIX) 40 MG tablet [id number] Yes Take 40 mg by mouth daily. [provider] Taking Active   IRON, FERROUS SULFATE, PO [id number] Yes Take by mouth daily. [provider] Taking Active   loratadine (CLARITIN) 10 MG tablet [id number] Yes Take 1 tablet (10 mg total) by mouth daily. Alba Cory, MD Taking Active   losartan (COZAAR) 25 MG tablet [id number] Yes Take 25 mg by mouth daily. [provider] Taking Active   magnesium oxide (MAG-OX) 400 (240 Mg) MG tablet [id number] Yes Take 400 mg by mouth daily. [provider] Taking Active   meloxicam (MOBIC) 15 MG tablet [id number] Yes TAKE 1 TABLET BY MOUTH DAILY Sowles, Danna Hefty, MD Taking Active   omeprazole (PRILOSEC) 20 MG capsule [id number] Yes Take 1 capsule (20 mg total) by mouth daily. Alba Cory, MD Taking Active   predniSONE (DELTASONE) 10 MG tablet [id number] Yes Take 1 tablet (10 mg total) by mouth 2 (two) times daily with a meal. Alba Cory, MD Taking Active   spironolactone (ALDACTONE)  25 MG tablet [id number] Yes Take 25 mg by mouth daily. [provider] Taking Active   zolpidem (AMBIEN) 5 MG tablet [id number] Yes TAKE 1 TABLET BY MOUTH AT BEDTIME AS NEEDED FOR SLEEP Sowles, Danna Hefty, MD Taking Active              Home Care and Equipment/Supplies: Were Home Health Services Ordered?: Yes Has Agency set up a time to come to your home?: No EMR reviewed for Home Health Orders: Orders present/patient has not received call (refer to CM for follow-up) (she just arrived home yesterday ( Holiday))  Functional Questionnaire: Do you need assistance with bathing/showering or dressing?: No Do you need assistance with meal preparation?: No Do you need assistance with eating?: No Do you have difficulty maintaining continence: Yes Do you need assistance with getting out of bed/getting out of a chair/moving?: No Do you have difficulty managing or taking your medications?: No  Follow up appointments reviewed: PCP Follow-up appointment confirmed?: No (she will schedule yesterday discharged and was a holiday) MD Provider Line [phone] Given: No Specialist Hospital Follow-up appointment confirmed?: Yes Date of Specialist follow-up appointment?: 11/08/23 Follow-Up Specialty Provider:: Cardiology,1st available Do you need transportation to your follow-up appointment?: No (driving at time of call) Do you understand care options if your condition(s) worsen?: Yes-patient verbalized understanding  SDOH Interventions Today    Flowsheet Row Most Recent Value  SDOH Interventions   Food Insecurity Interventions Intervention Not Indicated  Housing Interventions Intervention Not Indicated  Transportation Interventions Intervention Not Indicated  Utilities Interventions Intervention Not Indicated       Goals Addressed             This Visit's Progress    TOC Care Plan       Current Barriers:  Medication management multiple new medications Provider appointments Cardiology 12/17  RNCM Clinical Goal(s):  Patient will take all medications exactly as prescribed and will call provider for medication related questions as evidenced by no missed medication doses, no unmanaged  side effects  attend all  scheduled medical appointments: with Cardiology as evidenced by no missed appointments  continue to work with RN Care Manager to address care management and care coordination needs related to  CHF as evidenced by adherence to CM Team Scheduled appointments through collaboration with RN Care manager, provider, and care team.   Interventions: Evaluation of current treatment plan related to  self management and patient's adherence to plan as established by provider  Transitions of Care:  New goal. Doctor Visits  - discussed the importance of doctor visits Post discharge activity limitations prescribed by provider reviewed Post-op wound/incision care reviewed with patient/caregiver  Heart Failure Interventions:  (Status:  New goal.) Short Term Goal Basic overview and discussion of pathophysiology of Heart Failure reviewed Provided education on low sodium diet Discussed importance of daily weight and advised patient to weigh and record daily Reviewed role of diuretics in prevention of fluid overload and management of heart failure; Discussed the importance of keeping all appointments with provider  Patient Goals/Self-Care Activities: Participate in Transition of Care Program/Attend TOC scheduled calls Take all medications as prescribed Attend all scheduled provider appointments Call pharmacy for medication refills 3-7 days in advance of running out of medications Perform all self care activities independently  Perform IADL's (shopping, preparing meals, housekeeping, managing finances) independently Call provider office for new concerns or questions   Follow Up Plan:  The patient has been provided with contact information for the care management team and has  been advised to call with any health related questions or concerns.          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 Any updates , inconsistencies, discrepancies or acute care concerns will be addressed and routed to the correct Practitioner if indicated   Based on current information and Insurance plan -Reviewed benefits available to patient, including details about eligibility options for care if any area of needs were identified.  Reviewed patients ability to access and / or navigating the benefits system..Amb Referral made if indicted , refer to orders section of note for details   Please refer to Care Plan for goals and interventions -Effectiveness of interventions, symptom management and outcomes will be evaluated  weekly during [facility] 30-day Program Outreach calls  . Any necessary  changes and updates to Care Plan will be completed episodically    Reviewed goals for care Patient verbalizes understanding of instructions and care plan provided. Patient was encouraged to make informed decisions about their care, actively participate in managing their health condition, and implement lifestyle changes as needed to promote independence and self-management of health care  The patient has been provided with contact information for the care management team and has been advised to call with any health-related questions or concerns.   The patient has been provided with contact information for the care management team and has been advised to call with any health-related questions or concerns.  Susa Loffler , BSN, RN Care Management Coordinator [place]   [facility] [email or web address] Direct Dial: [phone]

## 2023-10-21 NOTE — Patient Instructions (Signed)
Visit Information  Thank you for taking time to visit with me today. Please don't hesitate to contact me if I can be of assistance to you before our next scheduled telephone appointment.  Our next appointment is by telephone on 12/3 at 1:00pm  Following is a copy of your care plan:   Goals Addressed             This Visit's Progress    TOC Care Plan       Current Barriers:  Medication management multiple new medications Provider appointments Cardiology 12/17  RNCM Clinical Goal(s):  Patient will take all medications exactly as prescribed and will call provider for medication related questions as evidenced by no missed medication doses, no unmanaged  side effects  attend all scheduled medical appointments: with Cardiology as evidenced by no missed appointments  continue to work with RN Care Manager to address care management and care coordination needs related to  CHF as evidenced by adherence to CM Team Scheduled appointments through collaboration with RN Care manager, provider, and care team.   Interventions: Evaluation of current treatment plan related to  self management and patient's adherence to plan as established by provider  Transitions of Care:  New goal. Doctor Visits  - discussed the importance of doctor visits Post discharge activity limitations prescribed by provider reviewed Post-op wound/incision care reviewed with patient/caregiver  Heart Failure Interventions:  (Status:  New goal.) Short Term Goal Basic overview and discussion of pathophysiology of Heart Failure reviewed Provided education on low sodium diet Discussed importance of daily weight and advised patient to weigh and record daily Reviewed role of diuretics in prevention of fluid overload and management of heart failure; Discussed the importance of keeping all appointments with provider  Patient Goals/Self-Care Activities: Participate in Transition of Care Program/Attend TOC scheduled calls Take all  medications as prescribed Attend all scheduled provider appointments Call pharmacy for medication refills 3-7 days in advance of running out of medications Perform all self care activities independently  Perform IADL's (shopping, preparing meals, housekeeping, managing finances) independently Call provider office for new concerns or questions   Follow Up Plan:  The patient has been provided with contact information for the care management team and has been advised to call with any health related questions or concerns.          Patient verbalizes understanding of instructions and care plan provided today and agrees to view in MyChart. Active MyChart status and patient understanding of how to access instructions and care plan via MyChart confirmed with patient.     The patient has been provided with contact information for the care management team and has been advised to call with any health related questions or concerns.   Please call the care guide team at [phone] if you need to cancel or reschedule your appointment.   Please call the [place] National Suicide Prevention Lifeline: (737) 241-9583 or TTY: (854)282-7900 TTY 339-040-9280) to talk to a trained counselor if you are experiencing a Mental Health or Behavioral Health Crisis or need someone to talk to.  Susa Loffler , BSN, RN Care Management Coordinator [place]   [facility] [email or web address] Direct Dial: [phone]

## 2023-10-25 ENCOUNTER — Telehealth: Payer: Self-pay | Admitting: Family Medicine

## 2023-10-25 ENCOUNTER — Other Ambulatory Visit: Payer: Self-pay

## 2023-10-25 ENCOUNTER — Ambulatory Visit: Payer: Self-pay | Admitting: *Deleted

## 2023-10-25 NOTE — Patient Outreach (Signed)
Care Management  Transitions of Care Program Transitions of Care Post-discharge week 2   10/25/2023 Name: Shelby Barry MRN: [id number] DOB: Jul 14, 1966  Subjective: Shelby Barry is a 57 y.o. year old female who is a primary care patient of Alba Cory, MD. The Care Management team Engaged with patient Engaged with patient by telephone to assess and address transitions of care needs.   Consent to Services:  Patient was given information about care management services, agreed to services, and gave verbal consent to participate.   Assessment:   Patient voices no new complaints Patient has not developed/ reported any new Medical issues / Dx or acute changes.- since last follow-up call for most recent  Hospital stay    11/24-11/28/ 2024  Patient is is pleasant mood, although she reports she is feeling worse than when she went to hospital . She reports increased SOB, less endurance, her LE are slightly swollen, she has needed to pace activities, she feels very fatigued, she has had leg cramps. She is concerned regarding multiple new medications and side effects and or interactions . She has not scheduled a PCP appt as of this date. She has a Cardiology f/u 12/17-1st available. She is not checking her B/OP although last reading was low  She was enc to drink adequate fluids and juice if she is able.  Patient educated on red flag s/s to watch for and was encouraged to report, any changes in baseline or  medication regimen,  changes in health status  /  well-being, safety concerns  or any new unmanaged side effects or symptoms not relieved with interventions  to PCP and / or the  [facility] Case Management team          SDOH Interventions    Flowsheet Row Telephone from 10/21/2023 in [place] POPULATION HEALTH DEPARTMENT  SDOH Interventions   Food Insecurity Interventions Intervention Not Indicated  Housing Interventions Intervention Not Indicated  Transportation Interventions Intervention Not  Indicated  Utilities Interventions Intervention Not Indicated        Goals Addressed             This Visit's Progress    TOC Care Plan       Current Barriers:  Medication management multiple new medications Provider appointments Cardiology 12/17  RNCM Clinical Goal(s):  Patient will take all medications exactly as prescribed and will call provider for medication related questions as evidenced by no missed medication doses, no unmanaged  side effects  attend all scheduled medical appointments: with Cardiology as evidenced by no missed appointments  continue to work with RN Care Manager to address care management and care coordination needs related to  CHF as evidenced by adherence to CM Team Scheduled appointments through collaboration with RN Care manager, provider, and care team.   Interventions: Evaluation of current treatment plan related to  self management and patient's adherence to plan as established by provider  Transitions of Care:  Goal on track:  NO. Doctor Visits  - discussed the importance of doctor visits Communication with Cardiology re: pt reporting feeling worse,fatigue, leg cramps, called and spoke with PCP office to request visit ASAP   Post discharge activity limitations prescribed by provider reviewed Post-op wound/incision care reviewed with patient/caregiver  Heart Failure Interventions:  (Status:  New goal.) Short Term Goal Basic overview and discussion of pathophysiology of Heart Failure reviewed Provided education on low sodium diet Discussed importance of daily weight and advised patient to weigh and record daily Reviewed role  of diuretics in prevention of fluid overload and management of heart failure; Discussed the importance of keeping all appointments with provider  Patient Goals/Self-Care Activities: Participate in Transition of Care Program/Attend TOC scheduled calls Take all medications as prescribed Attend all scheduled provider  appointments Call pharmacy for medication refills 3-7 days in advance of running out of medications Perform all self care activities independently  Perform IADL's (shopping, preparing meals, housekeeping, managing finances) independently Call provider office for new concerns or questions   Follow Up Plan:  The patient has been provided with contact information for the care management team and has been advised to call with any health related questions or concerns.          Plan:  Call placed and message left with Cardiology office   ( [phone]) regarding above noted concerns and request for patient to be seen earlier and or if they could touch base with patient. Call placed to PCP office spike with office staff regarding above noted concerns Nurse from PCP office will call patient and potentially schedule a urgent visit.  Routine follow-up and on-going assessment evaluation and education of disease processes, recommended interventions for both chronic and acute medical conditions , will occur during each weekly visit along with ongoing review of symptoms ,medication reviews and reconciliation. Any updates , inconsistencies, discrepancies or acute care concerns will be addressed and routed to the correct Practitioner if indicated   Based on current information and Insurance plan -Reviewed benefits available to patient, including details about eligibility options for care if any area of needs were identified.  Reviewed patients ability to access and / or navigating the benefits system..Amb Referral made if indicted , refer to orders section of note for details   Please refer to Care Plan for goals and interventions -Effectiveness of interventions, symptom management and outcomes will be evaluated  weekly during [facility] 30-day Program Outreach calls  . Any necessary  changes and updates to Care Plan will be completed episodically    Reviewed goals for care Patient verbalizes understanding of  instructions and care plan provided. Patient was encouraged to make informed decisions about their care, actively participate in managing their health condition, and implement lifestyle changes as needed to promote independence and self-management of health care    The patient has been provided with contact information for the care management team and has been advised to call with any health related questions or concerns.  Follow up with provider re: worsening s/s  Next follow-up  call 12/10 12 Noon   Susa Loffler , BSN, RN Care Management Coordinator [place]   [facility] [email or web address] Direct Dial: [phone]

## 2023-10-25 NOTE — Telephone Encounter (Signed)
Lorene Dy, case manager, has called in regards to her speaking to the patient today. Patient was hospitalized 10/16/23-10/20/23 and told Lorene Dy, the case manager, that she is feeling worse than before she went to the hospital, experiencing shortness of breath, fatigued, muscle cramps, does not have a potassium supplement ordered. Lorene Dy also stated that home health was supposed to be set up for patient but patient states no one has called her yet, and Lorene Dy could not find anything in the paperwork about that. Also that the patients blood pressure was previously low, but she does not have a BP cuff to check that at home, and feels like she lost fluid weight. Lorene Dy is concerned about home health and patient states she has some concerns about medications and new medications prescribed in the hospital. Lorene Dy left a message with her Cardiologist too, patient has an appointment with Cardiology on 11/08/2023.  Spoke to Nurse Triage Erskine Squibb who is going to contact patient for further needs/assistance and scheduling with PCP office.   Please advise.   Lorene Dy, case manager call back # [phone]

## 2023-10-25 NOTE — Telephone Encounter (Signed)
Case manager: Neysa Bonito has called with concerns regarding patient status since being released from hospital.  Reason for Disposition  [1] MILD difficulty breathing (e.g., minimal/no SOB at rest, SOB with walking, pulse <100) AND [2] NEW-onset or WORSE than normal  Answer Assessment - Initial Assessment Questions 1. RESPIRATORY STATUS: "Describe your breathing?" (e.g., wheezing, shortness of breath, unable to speak, severe coughing)      SOB with exertion- walks 30 feet and exhausted  2. ONSET: "When did this breathing problem begin?"      Since admission to hospital 3. PATTERN "Does the difficult breathing come and go, or has it been constant since it started?"      Comes and goes 4. SEVERITY: "How bad is your breathing?" (e.g., mild, moderate, severe)    - MILD: No SOB at rest, mild SOB with walking, speaks normally in sentences, can lie down, no retractions, pulse < 100.    - MODERATE: SOB at rest, SOB with minimal exertion and prefers to sit, cannot lie down flat, speaks in phrases, mild retractions, audible wheezing, pulse 100-120.    - SEVERE: Very SOB at rest, speaks in single words, struggling to breathe, sitting hunched forward, retractions, pulse > 120      mild 5. RECURRENT SYMPTOM: "Have you had difficulty breathing before?" If Yes, ask: "When was the last time?" and "What happened that time?"      Recent hospitalization 6. CARDIAC HISTORY: "Do you have any history of heart disease?" (e.g., heart attack, angina, bypass surgery, angioplasty)      yes 7. LUNG HISTORY: "Do you have any history of lung disease?"  (e.g., pulmonary embolus, asthma, emphysema)     yes 8. CAUSE: "What do you think is causing the breathing problem?"      New medication, dizziness, chilled- fingers/toes cold 9. OTHER SYMPTOMS: "Do you have any other symptoms? (e.g., dizziness, runny nose, cough, chest pain, fever)     fatigue  Protocols used: Breathing Difficulty-A-AH

## 2023-10-25 NOTE — Telephone Encounter (Signed)
  Chief Complaint: SOB- with exertion, fatigue Symptoms: patient reports recent hospitalization and release 10/20/23. Patient reports she is SOB with exertion and fatigued with any activity.  Frequency: symptoms since admission and discharge from hospital  Disposition: [] ED /[] Urgent Care (no appt availability in office) / [] Appointment(In office/virtual)/ []  [facility]/ [] Home Care/ [] Refused Recommended Disposition /[] [facility]/ [x]  Follow-up with PCP Additional Notes: Call to patient- case manager has contact patient and is concerned that she does not have follow up appointment. Patient states she has been SOB with exertion since her discharge from the hospital. Patient is also confused about her medications- I did review her medication list and she reports she is taking one that is not on the list: Metoprolol 50mg  bid. Patient states she is not taking several on the list: furosemide, losartin and spironolactone. This is concerning with patient's SOB- fatigue- patient advised she needs appointment- there is no open appointment with disposition with provider. Call note will be sent high priority- but patient also advised if her SOB gets worse- ED.

## 2023-10-26 NOTE — Telephone Encounter (Signed)
FYI

## 2023-10-26 NOTE — Telephone Encounter (Signed)
Lvm asking pt to return call to schedule appt with another provider in office. Dr Carlynn Purl is booked

## 2023-11-01 ENCOUNTER — Other Ambulatory Visit: Payer: Self-pay

## 2023-11-01 NOTE — Patient Outreach (Signed)
  Care Management  Transitions of Care Program Transitions of Care Post-discharge week 3  11/01/2023 Name: RIHANNAH VINCE MRN: [id number] DOB: 1966/02/15  Subjective: DON RUSCHAK is a 57 y.o. year old female who is a primary care patient of Alba Cory, MD. The Care Management team was unable to reach the patient by phone to assess and address transitions of care needs.   Plan: Additional outreach attempts will be made to reach the patient enrolled in the [facility] Program (Post Inpatient/ED Visit).  Susa Loffler , BSN, RN Care Management Coordinator [place]   [facility] [email or web address] Direct Dial: [phone]

## 2023-11-02 ENCOUNTER — Telehealth: Payer: Self-pay

## 2023-11-02 NOTE — Patient Outreach (Signed)
  Care Management  Transitions of Care Program Transitions of Care Post-discharge week 3  11/02/2023 Name: Shelby Barry MRN: [id number] DOB: 08-02-1966  Subjective: Shelby Barry is a 57 y.o. year old female who is a primary care patient of Alba Cory, MD. The Care Management team was unable to reach the patient by phone to assess and address transitions of care needs.   Plan: Additional outreach attempts will be made to reach the patient enrolled in the [facility] Program (Post Inpatient/ED Visit).  Susa Loffler , BSN, RN Care Management Coordinator [place]   [facility] [email or web address] Direct Dial: [phone]

## 2023-11-02 NOTE — Patient Outreach (Signed)
Care Management  Transitions of Care Program Care Coordination   11/02/2023 Name: [name] MRN: [id number] DOB: [birth year]  Subjective: Shelby Barry is a 57 y.o. year old female who is a primary care patient of Alba Cory, MD. The Care Management team Engaged with [facility] [phone]) on behalf patient to address transitions of care needs. Requested 14 day meal service s/p hospital discharge. Checked with [employer] [phone]- Unfortunately she does not have this benefit.  Patient will be updated   Susa Loffler , BSN, RN Care Management Coordinator [facility]   [facility] [email or web address] Direct Dial: [phone]

## 2023-11-03 ENCOUNTER — Telehealth: Payer: Self-pay

## 2023-11-03 NOTE — Addendum Note (Signed)
Addended by: Johnnette Barrios on: 11/03/2023 03:58 PM   Modules accepted: Orders, Level of Service

## 2023-11-03 NOTE — Telephone Encounter (Signed)
This encounter was created in error - please disregard.

## 2023-11-03 NOTE — Patient Outreach (Signed)
  Care Management  Transitions of Care Program Transitions of Care Post-discharge week 3  11/03/2023 Name: Shelby Barry MRN: [id number] DOB: 06-Apr-1966  Subjective: Shelby Barry is a 57 y.o. year old female who is a primary care patient of Alba Cory, MD. The Care Management team was unable to reach the patient by phone to assess and address transitions of care needs.   Plan: No further outreach attempts will be made at this time.  We have been unable to reach the patient.  Susa Loffler , BSN, RN Care Management Coordinator [place]   [facility] [email or web address] Direct Dial: [phone]

## 2023-11-04 ENCOUNTER — Other Ambulatory Visit: Payer: Self-pay | Admitting: Family Medicine

## 2023-11-04 DIAGNOSIS — F5104 Psychophysiologic insomnia: Secondary | ICD-10-CM

## 2023-11-04 NOTE — Telephone Encounter (Signed)
Requested medication (s) are due for refill today: yes  Requested medication (s) are on the active medication list: yes  Last refill:  10/04/23 #30/0  Future visit scheduled: yes  Notes to clinic:  Unable to refill per protocol, cannot delegate.    Requested Prescriptions  Pending Prescriptions Disp Refills   zolpidem (AMBIEN) 5 MG tablet [Pharmacy Med Name: Zolpidem Tartrate 5 MG Oral Tablet] 30 tablet 0    Sig: TAKE 1 TABLET BY MOUTH AT BEDTIME AS NEEDED FOR SLEEP     Not Delegated - Psychiatry:  Anxiolytics/Hypnotics Failed - 11/04/2023  5:21 PM      Failed - This refill cannot be delegated      Failed - Urine Drug Screen completed in last 360 days      Passed - Valid encounter within last 6 months    Recent Outpatient Visits           1 month ago COPD with asthma [facility])   [place] [facility] Alba Cory, MD   6 months ago COPD with bronchial hyperresponsiveness [facility])   [place] [facility] Alba Cory, MD   9 months ago Major depression, recurrent, chronic [facility])   [place] [facility] Alba Cory, MD   1 year ago Major depression, recurrent, chronic [facility])   [place] [facility] Alba Cory, MD   1 year ago Encounter for biometric screening   [facility] [place], [place]       Future Appointments             In 2 months Alba Cory, MD [facility], [facility]

## 2023-11-04 NOTE — Telephone Encounter (Signed)
Medication Refill -  Most Recent Primary Care Visit:  Provider: Alba Cory  Department: [facility]  Visit Type: OFFICE VISIT  Date: 09/06/2023  Medication: zolpidem (AMBIEN) 5 MG tablet [[id number]]   Has the patient contacted their pharmacy? Yes (Agent: If no, request that the patient contact the pharmacy for the refill. If patient does not wish to contact the pharmacy document the reason why and proceed with request.) (Agent: If yes, when and what did the pharmacy advise?)  Is this the correct pharmacy for this prescription? Yes If no, delete pharmacy and type the correct one.  This is the patient's preferred pharmacy:  [facility] [address], [place] - [address] [place] [address] Phone: [phone] Fax: [phone]   Has the prescription been filled recently? Yes  Is the patient out of the medication? Yes  Has the patient been seen for an appointment in the last year OR does the patient have an upcoming appointment? Yes  Can we respond through MyChart? Yes  Agent: Please be advised that Rx refills may take up to 3 business days. We ask that you follow-up with your pharmacy.

## 2023-11-04 NOTE — Telephone Encounter (Signed)
Requested medication (s) are due for refill today: yes  Requested medication (s) are on the active medication list: yes  Last refill:  10/04/23 #30/0  Future visit scheduled: yes  Notes to clinic:  Unable to refill per protocol, cannot delegate.      Requested Prescriptions  Pending Prescriptions Disp Refills   zolpidem (AMBIEN) 5 MG tablet 30 tablet 0    Sig: Take 1 tablet (5 mg total) by mouth at bedtime as needed. for sleep     Not Delegated - Psychiatry:  Anxiolytics/Hypnotics Failed - 11/04/2023  5:25 PM      Failed - This refill cannot be delegated      Failed - Urine Drug Screen completed in last 360 days      Passed - Valid encounter within last 6 months    Recent Outpatient Visits           1 month ago COPD with asthma [facility])   [place] [facility] Alba Cory, MD   6 months ago COPD with bronchial hyperresponsiveness [facility])   [place] [facility] Alba Cory, MD   9 months ago Major depression, recurrent, chronic [facility])   [place] [facility] Alba Cory, MD   1 year ago Major depression, recurrent, chronic [facility])   [place] [facility] Alba Cory, MD   1 year ago Encounter for biometric screening   [facility] [place], [place]       Future Appointments             In 2 months Alba Cory, MD [facility], [facility]

## 2023-11-07 ENCOUNTER — Other Ambulatory Visit: Payer: Self-pay | Admitting: Family Medicine

## 2023-11-07 DIAGNOSIS — F339 Major depressive disorder, recurrent, unspecified: Secondary | ICD-10-CM

## 2023-11-07 MED ORDER — ZOLPIDEM TARTRATE 5 MG PO TABS: 5.0000 mg | ORAL_TABLET | Freq: Every evening | ORAL | 1 refills | Status: DC | PRN

## 2023-11-21 ENCOUNTER — Other Ambulatory Visit: Payer: Managed Care, Other (non HMO)

## 2023-11-28 ENCOUNTER — Inpatient Hospital Stay: Payer: Managed Care, Other (non HMO) | Admitting: Internal Medicine

## 2023-11-28 ENCOUNTER — Encounter: Payer: Self-pay | Admitting: Internal Medicine

## 2023-11-28 ENCOUNTER — Inpatient Hospital Stay: Payer: Managed Care, Other (non HMO) | Attending: Internal Medicine

## 2023-11-28 DIAGNOSIS — Z1732 Human epidermal growth factor receptor 2 negative status: Secondary | ICD-10-CM | POA: Insufficient documentation

## 2023-11-28 DIAGNOSIS — Z923 Personal history of irradiation: Secondary | ICD-10-CM | POA: Diagnosis not present

## 2023-11-28 DIAGNOSIS — Z9221 Personal history of antineoplastic chemotherapy: Secondary | ICD-10-CM | POA: Diagnosis not present

## 2023-11-28 DIAGNOSIS — F419 Anxiety disorder, unspecified: Secondary | ICD-10-CM | POA: Diagnosis not present

## 2023-11-28 DIAGNOSIS — M8580 Other specified disorders of bone density and structure, unspecified site: Secondary | ICD-10-CM | POA: Insufficient documentation

## 2023-11-28 DIAGNOSIS — C50511 Malignant neoplasm of lower-outer quadrant of right female breast: Secondary | ICD-10-CM | POA: Diagnosis present

## 2023-11-28 DIAGNOSIS — Z90722 Acquired absence of ovaries, bilateral: Secondary | ICD-10-CM | POA: Insufficient documentation

## 2023-11-28 DIAGNOSIS — Z170 Estrogen receptor positive status [ER+]: Secondary | ICD-10-CM | POA: Insufficient documentation

## 2023-11-28 DIAGNOSIS — I890 Lymphedema, not elsewhere classified: Secondary | ICD-10-CM | POA: Insufficient documentation

## 2023-11-28 DIAGNOSIS — Z1721 Progesterone receptor positive status: Secondary | ICD-10-CM | POA: Insufficient documentation

## 2023-11-28 DIAGNOSIS — Z90710 Acquired absence of both cervix and uterus: Secondary | ICD-10-CM | POA: Insufficient documentation

## 2023-11-28 DIAGNOSIS — D509 Iron deficiency anemia, unspecified: Secondary | ICD-10-CM | POA: Diagnosis not present

## 2023-11-28 LAB — CBC WITH DIFFERENTIAL (CANCER CENTER ONLY)
Abs Immature Granulocytes: 0.03 10*3/uL (ref 0.00–0.07)
Basophils Absolute: 0 10*3/uL (ref 0.0–0.1)
Basophils Relative: 1 %
Eosinophils Absolute: 0.2 10*3/uL (ref 0.0–0.5)
Eosinophils Relative: 3 %
HCT: 40.6 % (ref 36.0–46.0)
Hemoglobin: 13.1 g/dL (ref 12.0–15.0)
Immature Granulocytes: 0 %
Lymphocytes Relative: 19 %
Lymphs Abs: 1.5 10*3/uL (ref 0.7–4.0)
MCH: 27.3 pg (ref 26.0–34.0)
MCHC: 32.3 g/dL (ref 30.0–36.0)
MCV: 84.6 fL (ref 80.0–100.0)
Monocytes Absolute: 0.5 10*3/uL (ref 0.1–1.0)
Monocytes Relative: 7 %
Neutro Abs: 5.5 10*3/uL (ref 1.7–7.7)
Neutrophils Relative %: 70 %
Platelet Count: 231 10*3/uL (ref 150–400)
RBC: 4.8 MIL/uL (ref 3.87–5.11)
RDW: 17.3 % — ABNORMAL HIGH (ref 11.5–15.5)
WBC Count: 7.7 10*3/uL (ref 4.0–10.5)
nRBC: 0 % (ref 0.0–0.2)

## 2023-11-28 LAB — CMP (CANCER CENTER ONLY)
ALT: 38 U/L (ref 0–44)
AST: 38 U/L (ref 15–41)
Albumin: 3.7 g/dL (ref 3.5–5.0)
Alkaline Phosphatase: 82 U/L (ref 38–126)
Anion gap: 9 (ref 5–15)
BUN: 21 mg/dL — ABNORMAL HIGH (ref 6–20)
CO2: 28 mmol/L (ref 22–32)
Calcium: 8.9 mg/dL (ref 8.9–10.3)
Chloride: 104 mmol/L (ref 98–111)
Creatinine: 0.77 mg/dL (ref 0.44–1.00)
GFR, Estimated: >60 mL/min (ref >60)
Glucose, Bld: 112 mg/dL — ABNORMAL HIGH (ref 70–99)
Potassium: 4 mmol/L (ref 3.5–5.1)
Sodium: 141 mmol/L (ref 135–145)
Total Bilirubin: 0.6 mg/dL (ref 0.0–1.2)
Total Protein: 6 g/dL — ABNORMAL LOW (ref 6.5–8.1)

## 2023-11-28 LAB — IRON AND TIBC
Iron: 52 ug/dL (ref 28–170)
Saturation Ratios: 11 % (ref 10.4–31.8)
TIBC: 476 ug/dL — ABNORMAL HIGH (ref 250–450)
UIBC: 424 ug/dL

## 2023-11-28 LAB — FERRITIN: Ferritin: 43 ng/mL (ref 11–307)

## 2023-11-28 NOTE — Progress Notes (Signed)
 [facility] OFFICE PROGRESS NOTE  Patient Care Team: Glenard Mire, [name], [name], [name], [name], [name], MD as Consulting Physician (Internal Medicine)   Cancer Staging  No matching staging information was found for the patient.    Oncology History Overview Note  # DEC 2017- RIGHT BREAST Stage II [pT1cpN1sn; screening] ER/PR- Pos- 90%; her 2 NEU- NEG s/p Lumpect & SLNBx; Dr.Sankar ]; JAN 2018- ddAC -T x12. AUG 2nd [finished RT].  # AUG 21st 2018- Start TAM; Nov 2018- Stopped sec intol/mood swings; arthralgias];  DEC 2019- Arimidex  1 mg/day; STOPPED JAN 2020 sec to intolerarnce.   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 Patient ambulating-independently.    Shelby Barry 58 y.o.  female pleasant patient above history of stage II breast cancer ER PR positive currently NOT on adjuvant endocrine therapy [sec to intolerance] is in for follow-up/ [facility] well clinical studies.   Unfortunately patient was recently diagnosed with nonischemic cardiomyopathy-currently being followed by cardiology.  Patient continues to have chronic shortness of breath palpitation.  Patient has had extensive workup including an echo, CT and MRI since last visit. Sees cardiology Dr. Jama [facility]STON in [facility]  Patient on PO iron. Denies any blood in stools; or black colored stools.  Denies any worsening joint pains. No fever and  chills.  Review of Systems  Constitutional:  Positive for malaise/fatigue. Negative for chills, diaphoresis, fever and weight loss.  HENT:  Negative for nosebleeds and sore throat.   Eyes:  Negative for double vision.  Respiratory:  Negative for hemoptysis and sputum production.   Cardiovascular:  Negative for chest pain, palpitations, orthopnea and leg swelling.  Gastrointestinal:  Positive for nausea and vomiting. Negative for blood in stool, constipation, diarrhea, heartburn and melena.  Genitourinary:  Negative for dysuria, frequency and urgency.  Musculoskeletal:  Negative for back pain.  Skin: Negative.  Negative for itching and rash.  Neurological:  Negative for dizziness, tingling, focal weakness, weakness and headaches.  Endo/Heme/Allergies:  Does not bruise/bleed easily.      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, estrogen receptor positive (HCC)    Cataract, bilateral    Chemotherapy induced nausea and vomiting    Cold    states she has had it for 2 months   Depression    GERD (gastroesophageal reflux disease)    uses baking soda for symptoms   Insomnia    Lumbago    Personal history of chemotherapy 11/22/2016   Personal history of radiation therapy 01/20/2017   Vitamin D  deficiency     PAST SURGICAL HISTORY :   Past Surgical History:  Procedure Laterality Date   ABDOMINAL HYSTERECTOMY  04/20/2014   BILATERAL SALPINGECTOMY  BIOPSY  08/10/2023   Procedure: BIOPSY;  Surgeon: Therisa Bi, MD;  [facility];  Service: Gastroenterology;;   BREAST BIOPSY Right 09/27/2016   positive   BREAST LUMPECTOMY Right 10/12/2016   positive   BREAST LUMPECTOMY WITH SENTINEL LYMPH NODE BIOPSY Right 10/12/2016   Procedure: BREAST LUMPECTOMY WITH SENTINEL  LYMPH NODE BX;  Surgeon: Louanne KANDICE Muse, MD;  Location: [facility] ORS;  Service: General;  Laterality: Right;   COLONOSCOPY WITH PROPOFOL  N/A 05/20/2016   Procedure: COLONOSCOPY WITH PROPOFOL ;  Surgeon: Rogelia Copping, MD;  Location: [facility];  Service: Endoscopy;  Laterality: N/A;   COLONOSCOPY WITH PROPOFOL  N/A 08/10/2023   Procedure: COLONOSCOPY WITH PROPOFOL ;  Surgeon: Therisa Bi, MD;  Location[facility];  Service: Gastroenterology;  Laterality: N/A;   ECTOPIC PREGNANCY SURGERY     ENDOMETRIAL ABLATION     ESOPHAGOGASTRODUODENOSCOPY (EGD) WITH PROPOFOL  N/A 08/10/2023   Procedure: ESOPHAGOGASTRODUODENOSCOPY (EGD) WITH PROPOFOL ;  Surgeon: Therisa Bi, MD;  Location[facility] END[facility]COPY;  Service: Gastroenterology;  Laterality: N/A;   POLYPECTOMY  08/10/2023   Procedure: POLYPECTOMY;  Surgeon: Therisa Bi, MD;  Location[facility];  Service: Gastroenterology;;   PORTACATH PLACEMENT Left 11/04/2016   Procedure: INSERTION PORT-A-CATH;  Surgeon: Louanne KANDICE Muse, MD;  Location: [facility] ORS;  Service: General;  Laterality: Left;  Left subclavian vein   TUBAL LIGATION      FAMILY HISTORY :   Family History  Problem Relation Age of Onset   Heart disease Mother    Depression Father    Obesity Father    Diabetes Father    Alcohol abuse Father    COPD Father    Alcohol abuse Brother    Seizures Brother    Breast cancer Sister 106    SOCIAL HISTORY:   Social History   Tobacco Use   Smoking status: Former    Current packs/day: 0.00    Average packs/day: 0.5 packs/day for 20.0 years (10.0 ttl pk-yrs)    Types: Cigarettes    Start date: 08/23/1996    Quit date: 08/23/2016    Years since quitting: 7.2   Smokeless tobacco: Never  Vaping Use   Vaping status: Never Used  Substance Use Topics   Alcohol use: Yes    Alcohol/week: 0.0 standard drinks of alcohol    Comment: occassional   Drug use: No    ALLERGIES:  is allergic to adhesive [tape] and nickel.  MEDICATIONS:   Current Outpatient Medications  Medication Sig Dispense Refill   acetaminophen  (TYLENOL ) 325 MG tablet Take 325 mg by mouth every 6 (six) hours as needed for fever.     albuterol  (VENTOLIN  HFA) 108 (90 Base) MCG/ACT inhaler Inhale 1-2 puffs into the lungs every 6 (six) hours as needed for wheezing or shortness of breath. 18 g 0   amphetamine -dextroamphetamine (ADDERALL XR) 20 MG 24 hr capsule Take 1 capsule (20 mg total) by mouth daily. 30 capsule 0   amphetamine -dextroamphetamine (ADDERALL XR) 20 MG 24 hr capsule Take 1 capsule (20 mg total) by mouth daily. 30 capsule 0   amphetamine -dextroamphetamine (ADDERALL XR) 20 MG 24 hr capsule Take 1 capsule (20 mg total) by mouth daily. 30 capsule 0   busPIRone  (BUSPAR ) 5 MG tablet TAKE 1 TO 2 TABLETS BY MOUTH ONCE DAILY AS NEEDED 60 tablet 0   empagliflozin (JARDIANCE) 10 MG TABS tablet Take 10 mg by mouth daily.     furosemide (LASIX) 40 MG tablet Take 40 mg by mouth 2 (two) times daily.  IRON, FERROUS SULFATE, PO Take by mouth daily.     loratadine  (CLARITIN ) 10 MG tablet Take 1 tablet (10 mg total) by mouth daily. 30 tablet 2   losartan (COZAAR) 25 MG tablet Take 25 mg by mouth daily.     magnesium oxide (MAG-OX) 400 (240 Mg) MG tablet Take 400 mg by mouth daily.     meloxicam  (MOBIC ) 15 MG tablet TAKE 1 TABLET BY MOUTH DAILY 90 tablet 0   omeprazole  (PRILOSEC) 20 MG capsule Take 1 capsule (20 mg total) by mouth daily. 90 capsule 1   spironolactone (ALDACTONE) 25 MG tablet Take 25 mg by mouth daily.     zolpidem  (AMBIEN ) 5 MG tablet Take 1 tablet (5 mg total) by mouth at bedtime as needed. for sleep 30 tablet 1   No current facility-administered medications for this visit.    PHYSICAL EXAMINATION: ECOG PERFORMANCE STATUS: 0 - Asymptomatic  BP 96/73 (BP Location: Left Arm, Patient Position: Sitting, Cuff Size: Large)   Pulse (!) 107   Temp (!) 96.5 F (35.8 C) (Tympanic)   Ht 5' 1.5 (1.562 m)   Wt 172 lb 6.4 oz (78.2 kg)   SpO2 97%    BMI 32.05 kg/m   Filed Weights   11/28/23 0913  Weight: 172 lb 6.4 oz (78.2 kg)      Physical Exam HENT:     Head: Normocephalic and atraumatic.     Mouth/Throat:     Pharynx: No oropharyngeal exudate.  Eyes:     Pupils: Pupils are equal, round, and reactive to light.  Cardiovascular:     Rate and Rhythm: Normal rate and regular rhythm.  Pulmonary:     Effort: No respiratory distress.     Breath sounds: No wheezing.  Abdominal:     General: Bowel sounds are normal. There is no distension.     Palpations: Abdomen is soft. There is no mass.     Tenderness: There is no guarding or rebound.     Comments: Mild tenderness in the right upper quadrant.  Musculoskeletal:        General: No tenderness. Normal range of motion.     Cervical back: Normal range of motion and neck supple.  Skin:    General: Skin is warm.     Comments:    Neurological:     Mental Status: She is alert and oriented to person, place, and time.  Psychiatric:        Mood and Affect: Affect normal.     LABORATORY DATA:  I have reviewed the data as listed    Component Value Date/Time   NA 141 11/28/2023 0905   NA 141 07/08/2022 1118   K 4.0 11/28/2023 0905   CL 104 11/28/2023 0905   CO2 28 11/28/2023 0905   GLUCOSE 112 (H) 11/28/2023 0905   BUN 21 (H) 11/28/2023 0905   BUN 17 07/08/2022 1118   CREATININE 0.77 11/28/2023 0905   CREATININE 0.74 11/20/2021 1346   CALCIUM 8.9 11/28/2023 0905   PROT 6.0 (L) 11/28/2023 0905   PROT 6.2 07/08/2022 1118   ALBUMIN 3.7 11/28/2023 0905   ALBUMIN 4.5 07/08/2022 1118   AST 38 11/28/2023 0905   ALT 38 11/28/2023 0905   ALKPHOS 82 11/28/2023 0905   BILITOT 0.6 11/28/2023 0905   GFRNONAA >60 11/28/2023 0905   GFRAA 108 02/22/2020 1331    No results found for: SPEP, UPEP  Lab Results  Component Value Date   WBC 7.7 11/28/2023  NEUTROABS 5.5 11/28/2023   HGB 13.1 11/28/2023   HCT 40.6 11/28/2023   MCV 84.6 11/28/2023   PLT 231 11/28/2023       Chemistry      Component Value Date/Time   NA 141 11/28/2023 0905   NA 141 07/08/2022 1118   K 4.0 11/28/2023 0905   CL 104 11/28/2023 0905   CO2 28 11/28/2023 0905   BUN 21 (H) 11/28/2023 0905   BUN 17 07/08/2022 1118   CREATININE 0.77 11/28/2023 0905   CREATININE 0.74 11/20/2021 1346      Component Value Date/Time   CALCIUM 8.9 11/28/2023 0905   ALKPHOS 82 11/28/2023 0905   AST 38 11/28/2023 0905   ALT 38 11/28/2023 0905   BILITOT 0.6 11/28/2023 0905       RADIOGRAPHIC STUDIES: I have personally reviewed the radiological images as listed and agreed with the findings in the report. No results found.   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   No evidence of any recurrence.  BIL mammo- DEC  2023- WNL. Stable.   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 Recommend close follow up with cardiology.  # Iron def Anemia- last colonoscopy- 2017 [Dr.Wohl]- no blood in stools. On PO iron-;-AUG 2024- with Dr.Wohl- re EGD/colo- NEGATIVE.    # Right UE lymphedema- G-1 stable.   #Osteopenia T score -1.9 [dec 2022]- stable; recommend compliance with  continue calcium plus vitamin-   # Anxiety- stable.   # DISPOSITION: # please schedule mammogram  # follow up in 6 months-MD; cbc/cmp; iron studies; ferritin; ca 27-29-- Dr.B    Orders Placed This Encounter  Procedures   CBC with Differential (Cancer Center Only)    Standing Status:   Future    Expected Date:   05/27/2024    Expiration Date:   11/27/2024   CMP (Cancer Center only)    Standing Status:    Future    Expected Date:   05/27/2024    Expiration Date:   11/27/2024   Iron and TIBC    Standing Status:   Future    Expected Date:   05/27/2024    Expiration Date:   11/27/2024   Ferritin    Standing Status:   Future    Expected Date:   05/27/2024    Expiration Date:   11/27/2024   Cancer antigen 27.29    Standing Status:   Future    Expected Date:   05/27/2024    Expiration Date:   11/27/2024   All questions were answered. The patient knows to call the clinic with any problems, questions or concerns.      Cindy JONELLE Joe, MD 11/28/2023 12:07 PM

## 2023-11-28 NOTE — Assessment & Plan Note (Addendum)
#   Breast cancer-stage II ER/PR positive HER-2/neu negative; declines Enodcrine therapy sec to intolerance.   No evidence of any recurrence.  BIL mammo- DEC  2023- WNL. Stable.   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 Recommend close follow up with cardiology.  # Iron def Anemia- last colonoscopy- 2017 [Dr.Wohl]- no blood in stools. On PO iron-;-AUG 2024- with Dr.Wohl- re EGD/colo- NEGATIVE.    # Right UE lymphedema- G-1 stable.   #Osteopenia T score -1.9 [dec 2022]- stable; recommend compliance with  continue calcium plus vitamin-   # Anxiety- stable.   # DISPOSITION: # please schedule mammogram  # follow up in 6 months-MD; cbc/cmp; iron studies; ferritin; ca 27-29-- Dr.B

## 2023-11-28 NOTE — Progress Notes (Signed)
 Having trouble sleeping, takes Ambien.  Pt states having SOB, racing heart rate, was told heart working at 15%. Has had an echo, CT and MRI since last visit. Sees cardiology Dr. Michele Rockers in [place].

## 2023-11-29 LAB — CANCER ANTIGEN 27.29: CA 27.29: 12.5 U/mL (ref 0.0–38.6)

## 2023-12-02 ENCOUNTER — Inpatient Hospital Stay: Payer: Managed Care, Other (non HMO)

## 2023-12-02 ENCOUNTER — Other Ambulatory Visit: Payer: Managed Care, Other (non HMO)

## 2023-12-04 ENCOUNTER — Other Ambulatory Visit: Payer: Self-pay | Admitting: Family Medicine

## 2023-12-04 DIAGNOSIS — F339 Major depressive disorder, recurrent, unspecified: Secondary | ICD-10-CM

## 2023-12-06 DIAGNOSIS — C50511 Malignant neoplasm of lower-outer quadrant of right female breast: Secondary | ICD-10-CM

## 2023-12-06 NOTE — Research (Signed)
 BWEL, Month 72 Visit  12/06/23 1130 AM Patient Shelby Barry presents today unaccompanied for her 72 Month visit on the BWEL study.  Consent Addendum - ICF for [phone] briefly reviewed with the patient. The patient declines to participate in the sub-study due to the needle sticks for labs. She states she doesn't have good veins anymore for this.   Questionnaires - Patient was provided with study questionnaires and completed these without any difficulty.  This RN reviewed for completeness.  Adverse Events - AEs and Solicited AEs are no longer collected at this point in the patient's trial participation.   Weight - This RN collected the patient's weight two times, in light clothing and without shoes.  Both weights were 74.5 kg.  Waist and Hip Circumference -  Waist and Hip circumference were measured twice by this RN, per protocol.  Circumferences listed below: Waist - 93.9 cm / 92.7 cm Hip - 109.2 cm / 107.9 cn  H&P - Patient saw Dr. Rennie on 11/27/2022 for follow up assessment. Vital signs are documented in office encounter notes for this visit. Patient endorses a lot of anxiety with multiple life stressors occurring in the past year. She states she was recently diagnosed with heart problems for which Dr. Rennie says is because of the chemotherapy. She says she has a lot of different appointments she goes to now for that reason.   Visit Planning - Patient is not due for another BWEL visit until next year.  This RN thanked the patient for her ongoing participation in the BWEL study.  Patient denied any questions or concerns related to trial participation.  Reena Romans, RN  12/06/2023 3:49 PM

## 2023-12-07 ENCOUNTER — Other Ambulatory Visit: Payer: Self-pay | Admitting: Family Medicine

## 2023-12-08 ENCOUNTER — Other Ambulatory Visit: Payer: Managed Care, Other (non HMO)

## 2023-12-08 ENCOUNTER — Ambulatory Visit: Payer: Managed Care, Other (non HMO) | Admitting: Internal Medicine

## 2023-12-14 ENCOUNTER — Other Ambulatory Visit: Payer: Self-pay | Admitting: Family Medicine

## 2023-12-14 DIAGNOSIS — K219 Gastro-esophageal reflux disease without esophagitis: Secondary | ICD-10-CM

## 2023-12-14 NOTE — Telephone Encounter (Signed)
Requested Prescriptions  Pending Prescriptions Disp Refills   omeprazole (PRILOSEC) 20 MG capsule [Pharmacy Med Name: Omeprazole 20 MG Oral Capsule Delayed Release] 90 capsule 0    Sig: TAKE 1 CAPSULE BY MOUTH DAILY     Gastroenterology: Proton Pump Inhibitors Passed - 12/14/2023  2:15 PM      Passed - Valid encounter within last 12 months    Recent Outpatient Visits           3 months ago COPD with asthma [facility])   [facility] Alba Cory, MD   7 months ago COPD with bronchial hyperresponsiveness [facility])   [facility] Alba Cory, MD   10 months ago Major depression, recurrent, chronic [facility])   [facility] Alba Cory, MD   1 year ago Major depression, recurrent, chronic [facility])   [place] [facility] Alba Cory, MD   1 year ago Encounter for biometric screening   [facility] Danelle Berry, [place]       Future Appointments             In 3 weeks Alba Cory, MD [facility], [facility]

## 2023-12-23 ENCOUNTER — Ambulatory Visit
Admission: RE | Admit: 2023-12-23 | Discharge: 2023-12-23 | Disposition: A | Discharge status: Home / Self Care | Payer: Managed Care, Other (non HMO) | Source: Ambulatory Visit | Attending: Internal Medicine | Admitting: Internal Medicine

## 2023-12-23 DIAGNOSIS — Z170 Estrogen receptor positive status [ER+]: Secondary | ICD-10-CM | POA: Diagnosis present

## 2023-12-23 DIAGNOSIS — E559 Vitamin D deficiency, unspecified: Secondary | ICD-10-CM | POA: Diagnosis present

## 2023-12-23 DIAGNOSIS — C50511 Malignant neoplasm of lower-outer quadrant of right female breast: Secondary | ICD-10-CM | POA: Insufficient documentation

## 2024-01-01 ENCOUNTER — Other Ambulatory Visit: Payer: Self-pay | Admitting: Family Medicine

## 2024-01-01 DIAGNOSIS — F339 Major depressive disorder, recurrent, unspecified: Secondary | ICD-10-CM

## 2024-01-02 NOTE — Telephone Encounter (Signed)
 Requested Prescriptions  Pending Prescriptions Disp Refills   busPIRone  (BUSPAR ) 5 MG tablet [Pharmacy Med Name: busPIRone  HCl 5 MG Oral Tablet] 60 tablet 0    Sig: TAKE 1 TO 2 TABLETS BY MOUTH ONCE DAILY AS NEEDED     Psychiatry: Anxiolytics/Hypnotics - Non-controlled Passed - 01/02/2024  3:12 PM      Passed - Valid encounter within last 12 months    Recent Outpatient Visits           3 months ago COPD with asthma [facility])   [facility] Arleen Lacer, MD   8 months ago COPD with bronchial hyperresponsiveness [facility])   [facility] Arleen Lacer, MD   11 months ago Major depression, recurrent, chronic [facility])   [facility] Sowles, [name], MD   1 year ago Major depression, recurrent, chronic [facility])   [facility] Sowles, [name], MD   1 year ago Encounter for biometric screening   [facility] Adeline Hone, PA-C       Future Appointments             In 1 week Sowles, [name], MD [facility], [facility]

## 2024-01-09 ENCOUNTER — Ambulatory Visit: Payer: Managed Care, Other (non HMO) | Admitting: Family Medicine

## 2024-01-10 ENCOUNTER — Other Ambulatory Visit: Payer: Self-pay | Admitting: Family Medicine

## 2024-01-10 DIAGNOSIS — F5104 Psychophysiologic insomnia: Secondary | ICD-10-CM

## 2024-01-10 NOTE — Telephone Encounter (Signed)
Pt missed her appt for yesterday and has rescheduled for Monday. She is completely out of Ambien and would like to know if your able to refill this one time? Asking that you send to [facility]

## 2024-01-16 ENCOUNTER — Encounter: Payer: Self-pay | Admitting: Family Medicine

## 2024-01-16 ENCOUNTER — Ambulatory Visit: Payer: Managed Care, Other (non HMO) | Admitting: Family Medicine

## 2024-01-16 VITALS — BP 132/80 | HR 104 | Temp 97.7°F | Resp 16 | Ht 61.5 in | Wt 167.4 lb

## 2024-01-16 DIAGNOSIS — K2100 Gastro-esophageal reflux disease with esophagitis, without bleeding: Secondary | ICD-10-CM

## 2024-01-16 DIAGNOSIS — I5022 Chronic systolic (congestive) heart failure: Secondary | ICD-10-CM | POA: Diagnosis not present

## 2024-01-16 DIAGNOSIS — Z853 Personal history of malignant neoplasm of breast: Secondary | ICD-10-CM

## 2024-01-16 DIAGNOSIS — I428 Other cardiomyopathies: Secondary | ICD-10-CM | POA: Diagnosis not present

## 2024-01-16 DIAGNOSIS — F339 Major depressive disorder, recurrent, unspecified: Secondary | ICD-10-CM | POA: Diagnosis not present

## 2024-01-16 DIAGNOSIS — F902 Attention-deficit hyperactivity disorder, combined type: Secondary | ICD-10-CM

## 2024-01-16 DIAGNOSIS — F5104 Psychophysiologic insomnia: Secondary | ICD-10-CM

## 2024-01-16 DIAGNOSIS — J4489 Other specified chronic obstructive pulmonary disease: Secondary | ICD-10-CM

## 2024-01-16 DIAGNOSIS — I340 Nonrheumatic mitral (valve) insufficiency: Secondary | ICD-10-CM

## 2024-01-16 MED ORDER — ZOLPIDEM TARTRATE 5 MG PO TABS: 5.0000 mg | ORAL_TABLET | Freq: Every evening | ORAL | 0 refills | Status: DC | PRN

## 2024-01-16 MED ORDER — AMPHETAMINE-DEXTROAMPHET ER 20 MG PO CP24: 20.0000 mg | ORAL_CAPSULE | Freq: Every day | ORAL | 0 refills | Status: DC

## 2024-01-16 MED ORDER — BUSPIRONE HCL 5 MG PO TABS
5.0000 mg | ORAL_TABLET | Freq: Two times a day (BID) | ORAL | 0 refills | Status: AC
Start: 2024-01-16 — End: ?

## 2024-01-16 MED ORDER — OMEPRAZOLE 20 MG PO CPDR: 20.0000 mg | DELAYED_RELEASE_CAPSULE | Freq: Every day | ORAL | 0 refills | Status: DC

## 2024-01-16 NOTE — Progress Notes (Signed)
 Name: Shelby Barry   MRN: [id number]    DOB: [birth year]   Date:01/16/2024       Progress Note  Subjective  Chief Complaint  Chief Complaint  Patient presents with   Medical Management of Chronic Issues   HPI   ADD combined type: she told me she always felt that she had ADD so we referred her for formal testing , she saw [facility] in [place] and the diagnosis was positive for ADD She states Adderal XR helps her focus, she feels less anxious when she takes medication and feels more accomplished . She lost her job Feb 17 th and is now only taking medication prn. She states cardiologist is aware   CHF systolic with low EF and secondary to non ischemic cardiomyopathy: likely due to chemotherapy for breast cancer. Currently seeing cardiologist , Dr. Nedra Hai , [facility] provider. Denies orthopnea, denies sob with activity since taking lasix. Denies chest pain or palpitation. She had Echo in Nov and is going back for repeat studies tomorrow.  Major depression chronic recurrent: she has a long history of depression over the years, her father committed suicide when she was in her 61's.  She placed her mother in the nursing home Cannot tolerate Lexapro, she was taking zoloft helped with symptoms but ran out medication and states only wants to take prn medication. She was really stressed since her grown son moved back in with her and her sister in the  Spring of 2023 . He is a Clinical research associate but has been behaving differently. Change in personality, hanging out with homeless people, he was giving all his belongings away. He is doing better now. She is living with her sister  She still takes buspar usually once a day and occasionally twice a day    History of breast cancer: still seeing hematologist, stopped anastrozole months ago, tried Tamoxifen for about 6 months but also stopped in 2019 on her own . She is up to date with mammogram, last mammogram was 12/23, during last visit with oncologist she was found  to have iron deficiency anemia, she was seen by GI  had EGD and colonoscopy diagnosed with GERD / hiatal hernia and esophagitis and had one polyp. She is taking PPI and symptoms are controlled now    COPD with hyporesponsiveness also has a history of Asthma she was using Advair but stopped after an episode of thrush,, she tried Trelegy and stopped that also.  She quit smoking. Since treated for CHF her symptoms has improved   Hoarseness and otalgia: she was seen by ENT, using a nasal spray that caused her nose to bleed but states while using medication her hoarseness resolved. 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, estrogen receptor positive (HCC) 10/26/2016   Major depression, recurrent, chronic (HCC) 08/22/2015   Bursitis of shoulder 08/22/2015   Bilateral cataracts 08/22/2015   Circadian rhythm sleep disorder, 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   Gastroesophageal reflux disease without esophagitis 08/22/2015   Vitamin D deficiency 08/22/2015   Low back pain 08/22/2015   GAD (generalized anxiety disorder)  08/22/2015   History of anemia 08/17/2007   Insomnia 08/16/2007    Past Surgical History:  Procedure Laterality Date   ABDOMINAL HYSTERECTOMY  04/20/2014   BILATERAL SALPINGECTOMY     BIOPSY  08/10/2023   Procedure: BIOPSY;  Surgeon: Wyline Mood, MD;  Location: [facility] ENDOSCOPY;  Service: Gastroenterology;;   BREAST BIOPSY Right 09/27/2016   positive   BREAST LUMPECTOMY Right 10/12/2016   positive   BREAST LUMPECTOMY WITH SENTINEL LYMPH NODE BIOPSY  Right 10/12/2016   Procedure: BREAST LUMPECTOMY WITH SENTINEL LYMPH NODE BX;  Surgeon: Kieth Brightly, MD;  Location: [facility] ORS;  Service: General;  Laterality: Right;   COLONOSCOPY WITH PROPOFOL N/A 05/20/2016   Procedure: COLONOSCOPY WITH PROPOFOL;  Surgeon: Midge Minium, MD;  [facility];  Service: Endoscopy;  Laterality: N/A;   COLONOSCOPY WITH PROPOFOL N/A 08/10/2023   Procedure: COLONOSCOPY WITH PROPOFOL;  Surgeon: Wyline Mood, MD;  Location: [facility] ENDOSCOPY;  Service: Gastroenterology;  Laterality: N/A;   ECTOPIC PREGNANCY SURGERY     ENDOMETRIAL ABLATION     ESOPHAGOGASTRODUODENOSCOPY (EGD) WITH PROPOFOL N/A 08/10/2023   Procedure: ESOPHAGOGASTRODUODENOSCOPY (EGD) WITH PROPOFOL;  Surgeon: Wyline Mood, MD;  [facility] END[facility];  Service: Gastroenterology;  Laterality: N/A;   POLYPECTOMY  08/10/2023   Procedure: POLYPECTOMY;  Surgeon: Wyline Mood, MD;  Location: [facility] ENDOSCOPY;  Service: Gastroenterology;;   PORTACATH PLACEMENT Left 11/04/2016   Procedure: INSERTION PORT-A-CATH;  Surgeon: Kieth Brightly, MD;  Location: [facility] ORS;  Service: General;  Laterality: Left;  Left subclavian vein   TUBAL LIGATION      Family History  Problem Relation Age of Onset   Heart disease Mother    Depression Father    Obesity Father    Diabetes Father    Alcohol abuse Father    COPD Father    Alcohol abuse Brother    Seizures Brother    Breast cancer Sister 26    Social History   Tobacco Use   Smoking status: Former    Current packs/day: 0.00    Average packs/day: 0.5 packs/day for 20.0 years (10.0 ttl pk-yrs)    Types: Cigarettes    Start date: 08/23/1996    Quit date: 08/23/2016    Years since quitting: 7.4   Smokeless tobacco: Never  Substance Use Topics   Alcohol use: Yes    Alcohol/week: 0.0 standard drinks of alcohol    Comment: occassional     Current Outpatient Medications:    acetaminophen (TYLENOL) 325 MG tablet, Take 325 mg by mouth every 6  (six) hours as needed for fever., Disp: , Rfl:    albuterol (VENTOLIN HFA) 108 (90 Base) MCG/ACT inhaler, INHALE 1 TO 2 PUFFS BY MOUTH EVERY 6 HOURS AS NEEDED FOR WHEEZING FOR SHORTNESS OF BREATH, Disp: 9 g, Rfl: 0   amphetamine-dextroamphetamine (ADDERALL XR) 20 MG 24 hr capsule, Take 1 capsule (20 mg total) by mouth daily., Disp: 30 capsule, Rfl: 0   amphetamine-dextroamphetamine (ADDERALL XR) 20 MG 24 hr capsule, Take 1 capsule (20 mg total) by mouth daily., Disp: 30 capsule, Rfl: 0   amphetamine-dextroamphetamine (ADDERALL XR) 20 MG 24 hr capsule, Take 1 capsule (20 mg total) by mouth daily., Disp: 30 capsule, Rfl: 0   busPIRone (BUSPAR) 5 MG tablet, TAKE 1 TO 2 TABLETS BY MOUTH ONCE DAILY AS NEEDED, Disp: 60 tablet, Rfl: 0   empagliflozin (JARDIANCE) 10 MG TABS tablet, Take 10 mg by mouth daily., Disp: , Rfl:    furosemide (LASIX) 40 MG tablet, Take  40 mg by mouth 2 (two) times daily., Disp: , Rfl:    IRON, FERROUS SULFATE, PO, Take by mouth daily., Disp: , Rfl:    loratadine (CLARITIN) 10 MG tablet, Take 1 tablet (10 mg total) by mouth daily., Disp: 30 tablet, Rfl: 2   losartan (COZAAR) 25 MG tablet, Take 25 mg by mouth daily., Disp: , Rfl:    magnesium oxide (MAG-OX) 400 (240 Mg) MG tablet, Take 400 mg by mouth daily., Disp: , Rfl:    meloxicam (MOBIC) 15 MG tablet, TAKE 1 TABLET BY MOUTH DAILY, Disp: 90 tablet, Rfl: 0   metoprolol succinate (TOPROL-XL) 25 MG 24 hr tablet, Take 25 mg by mouth daily., Disp: , Rfl:    omeprazole (PRILOSEC) 20 MG capsule, TAKE 1 CAPSULE BY MOUTH DAILY, Disp: 90 capsule, Rfl: 0   spironolactone (ALDACTONE) 25 MG tablet, Take 25 mg by mouth daily., Disp: , Rfl:    torsemide (DEMADEX) 20 MG tablet, Take by mouth., Disp: , Rfl:    zolpidem (AMBIEN) 5 MG tablet, TAKE 1 TABLET BY MOUTH AT BEDTIME AS NEEDED FOR SLEEP, Disp: 7 tablet, Rfl: 0  Allergies  Allergen Reactions   Adhesive [Tape] Other (See Comments)    Blisters/rash  PLEASE USE PAPER TAPE ONLY    Nickel     PT CAN WEAR GOLD, STERLING SILVER WITH NO PROBLEM-PT HAS NEVER HAD ANY PROBLEMS IN THE OR    I personally reviewed active problem list, medication list, allergies with the patient/caregiver today.   ROS  Ten systems reviewed and is negative except as mentioned in HPI    Objective  Vitals:   01/16/24 1028 01/16/24 1031  BP: 132/80   Pulse: (!) 122 (!) 104  Resp: 16   Temp: 97.7 F (36.5 C)   TempSrc: Oral   SpO2: 96%   Weight: 167 lb 6.4 oz (75.9 kg)   Height: 5' 1.5" (1.562 m)     Body mass index is 31.12 kg/m.  Physical Exam  Constitutional: Patient appears well-developed and well-nourished. Obese  No distress.  HEENT: head atraumatic, normocephalic, pupils equal and reactive to light, neck supple Cardiovascular: Normal rate, regular rhythm and normal heart sounds.  No murmur heard. No BLE edema. Pulmonary/Chest: Effort normal and breath sounds normal. No respiratory distress. Abdominal: Soft.  There is no tenderness. Psychiatric: Patient has a normal mood and affect. behavior is normal. Judgment and thought content normal.   Recent Results (from the past 2160 hours)  Cancer antigen 27.29     Status: None   Collection Time: 11/28/23  9:05 AM  Result Value Ref Range   CA 27.29 12.5 0.0 - 38.6 U/mL    Comment: (NOTE) Siemens Centaur Immunochemiluminometric Methodology (ICMA) Values obtained with different assay methods or kits cannot be used interchangeably. Results cannot be interpreted as absolute evidence of the presence or absence of malignant disease. Performed At: [facility] [address] [place], [place] [id number] Jolene Schimke MD HQ:[id number]   Ferritin     Status: None   Collection Time: 11/28/23  9:05 AM  Result Value Ref Range   Ferritin 43 11 - 307 ng/mL    Comment: Performed at [facility], [address] [place], [place] [address]  Iron and TIBC     Status: Abnormal   Collection Time: 11/28/23  9:05 AM   Result Value Ref Range   Iron 52 28 - 170 ug/dL   TIBC 010 (H) 272 - 536 ug/dL   Saturation Ratios 11 10.4 -  31.8 %   UIBC 424 ug/dL    Comment: Performed at [facility], [address] [place], [place] [address]  CBC with Differential (Cancer Center Only)     Status: Abnormal   Collection Time: 11/28/23  9:05 AM  Result Value Ref Range   WBC Count 7.7 4.0 - 10.5 K/uL   RBC 4.80 3.87 - 5.11 MIL/uL   Hemoglobin 13.1 12.0 - 15.0 g/dL   HCT 60.4 54.0 - 98.1 %   MCV 84.6 80.0 - 100.0 fL   MCH 27.3 26.0 - 34.0 pg   MCHC 32.3 30.0 - 36.0 g/dL   RDW 19.1 (H) 47.8 - 29.5 %   Platelet Count 231 150 - 400 K/uL   nRBC 0.0 0.0 - 0.2 %   Neutrophils Relative % 70 %   Neutro Abs 5.5 1.7 - 7.7 K/uL   Lymphocytes Relative 19 %   Lymphs Abs 1.5 0.7 - 4.0 K/uL   Monocytes Relative 7 %   Monocytes Absolute 0.5 0.1 - 1.0 K/uL   Eosinophils Relative 3 %   Eosinophils Absolute 0.2 0.0 - 0.5 K/uL   Basophils Relative 1 %   Basophils Absolute 0.0 0.0 - 0.1 K/uL   Immature Granulocytes 0 %   Abs Immature Granulocytes 0.03 0.00 - 0.07 K/uL    Comment: Performed at [facility], [address] [place], [place] [address]  CMP (Cancer Center only)     Status: Abnormal   Collection Time: 11/28/23  9:05 AM  Result Value Ref Range   Sodium 141 135 - 145 mmol/L   Potassium 4.0 3.5 - 5.1 mmol/L   Chloride 104 98 - 111 mmol/L   CO2 28 22 - 32 mmol/L   Glucose, Bld 112 (H) 70 - 99 mg/dL    Comment: Glucose reference range applies only to samples taken after fasting for at least 8 hours.   BUN 21 (H) 6 - 20 mg/dL   Creatinine 8.65 7.84 - 1.00 mg/dL   Calcium 8.9 8.9 - 69.6 mg/dL   Total Protein 6.0 (L) 6.5 - 8.1 g/dL   Albumin 3.7 3.5 - 5.0 g/dL   AST 38 15 - 41 U/L   ALT 38 0 - 44 U/L   Alkaline Phosphatase 82 38 - 126 U/L   Total Bilirubin 0.6 0.0 - 1.2 mg/dL   GFR, Estimated >29 >52 mL/min    Comment: (NOTE) Calculated using the CKD-EPI Creatinine Equation (2021)    Anion  gap 9 5 - 15    Comment: Performed at [facility], [address] [place], [place] [address]    Diabetic Foot Exam:     PHQ2/9:    01/16/2024   10:28 AM 09/06/2023   11:00 AM 05/05/2023   11:49 AM 02/01/2023   10:08 AM 10/26/2022   10:36 AM  Depression screen PHQ 2/9  Decreased Interest 0 0 1 1 0  Down, Depressed, Hopeless 0 1 1 1  0  PHQ - 2 Score 0 1 2 2  0  Altered sleeping 0 3 1 1  0  Tired, decreased energy 0 2 0 1 0  Change in appetite 0 0 0 0 0  Feeling bad or failure about yourself  0 0 0 1 0  Trouble concentrating 0 0 0 1 0  Moving slowly or fidgety/restless 0 0 0 0 0  Suicidal thoughts 0 0 0 0 0  PHQ-9 Score 0 6 3 6  0  Difficult doing work/chores Not difficult at all Somewhat  difficult Not difficult at all Not difficult at all     phq 9 is negative  Fall Risk:    01/16/2024   10:23 AM 09/06/2023   10:58 AM 05/05/2023   11:48 AM 02/01/2023   10:02 AM 10/26/2022   10:35 AM  Fall Risk   Falls in the past year? 0 1 0  0  Number falls in past yr: 0 0 0  0  Injury with Fall? 0 0 0  0  Risk for fall due to : No Fall Risks History of fall(s)  No Fall Risks No Fall Risks  Follow up Falls prevention discussed;Education provided;Falls evaluation completed Falls prevention discussed;Education provided;Falls evaluation completed  Falls prevention discussed Falls prevention discussed     Assessment & Plan  1. Non-ischemic cardiomyopathy (HCC) (Primary)  Likely due to chemotherapy, seeing cardiologist   2. Major depression, recurrent, chronic (HCC)  - busPIRone (BUSPAR) 5 MG tablet; Take 1 tablet (5 mg total) by mouth 2 (two) times daily.  Dispense: 100 tablet; Refill: 0  3. COPD with asthma (HCC)  stable  4. Chronic systolic congestive heart failure (HCC)  Doing better since treated , taking all medications  5. Gastroesophageal reflux disease with esophagitis without hemorrhage  Stable now   6. Moderate mitral valve regurgitation  Seeing cardiologist    7. Attention deficit hyperactivity disorder (ADHD), combined type  - amphetamine-dextroamphetamine (ADDERALL XR) 20 MG 24 hr capsule; Take 1 capsule (20 mg total) by mouth daily.  Dispense: 30 capsule; Refill: 0 - amphetamine-dextroamphetamine (ADDERALL XR) 20 MG 24 hr capsule; Take 1 capsule (20 mg total) by mouth daily.  Dispense: 30 capsule; Refill: 0 - amphetamine-dextroamphetamine (ADDERALL XR) 20 MG 24 hr capsule; Take 1 capsule (20 mg total) by mouth daily.  Dispense: 30 capsule; Refill: 0  8. Psychophysiological insomnia  - zolpidem (AMBIEN) 5 MG tablet; Take 1 tablet (5 mg total) by mouth at bedtime as needed. for sleep  Dispense: 90 tablet; Refill: 0  9. History of right breast cancer

## 2024-01-26 ENCOUNTER — Other Ambulatory Visit: Payer: Managed Care, Other (non HMO)

## 2024-02-08 ENCOUNTER — Other Ambulatory Visit: Payer: Self-pay | Admitting: Family Medicine

## 2024-03-28 DIAGNOSIS — I428 Other cardiomyopathies: Secondary | ICD-10-CM | POA: Insufficient documentation

## 2024-04-10 ENCOUNTER — Encounter: Payer: Self-pay | Admitting: Internal Medicine

## 2024-04-17 ENCOUNTER — Encounter: Payer: Self-pay | Admitting: Family Medicine

## 2024-04-17 ENCOUNTER — Ambulatory Visit: Payer: Managed Care, Other (non HMO) | Admitting: Family Medicine

## 2024-04-17 ENCOUNTER — Encounter: Payer: Self-pay | Admitting: Internal Medicine

## 2024-04-17 VITALS — BP 110/70 | HR 95 | Resp 16 | Ht 61.5 in | Wt 169.8 lb

## 2024-04-17 DIAGNOSIS — I5042 Chronic combined systolic (congestive) and diastolic (congestive) heart failure: Secondary | ICD-10-CM

## 2024-04-17 DIAGNOSIS — F5104 Psychophysiologic insomnia: Secondary | ICD-10-CM

## 2024-04-17 DIAGNOSIS — I428 Other cardiomyopathies: Secondary | ICD-10-CM

## 2024-04-17 DIAGNOSIS — F339 Major depressive disorder, recurrent, unspecified: Secondary | ICD-10-CM | POA: Diagnosis not present

## 2024-04-17 DIAGNOSIS — J3089 Other allergic rhinitis: Secondary | ICD-10-CM

## 2024-04-17 DIAGNOSIS — J4489 Other specified chronic obstructive pulmonary disease: Secondary | ICD-10-CM

## 2024-04-17 DIAGNOSIS — K2100 Gastro-esophageal reflux disease with esophagitis, without bleeding: Secondary | ICD-10-CM

## 2024-04-17 DIAGNOSIS — F902 Attention-deficit hyperactivity disorder, combined type: Secondary | ICD-10-CM

## 2024-04-17 DIAGNOSIS — I340 Nonrheumatic mitral (valve) insufficiency: Secondary | ICD-10-CM

## 2024-04-17 MED ORDER — SPIRIVA RESPIMAT 2.5 MCG/ACT IN AERS: 2.0000 | INHALATION_SPRAY | Freq: Every day | RESPIRATORY_TRACT | 1 refills | Status: DC

## 2024-04-17 MED ORDER — ZOLPIDEM TARTRATE 5 MG PO TABS: 5.0000 mg | ORAL_TABLET | Freq: Every evening | ORAL | 2 refills | Status: DC | PRN

## 2024-04-17 MED ORDER — LORATADINE 10 MG PO TABS: 10.0000 mg | ORAL_TABLET | Freq: Every day | ORAL | 2 refills | Status: AC

## 2024-04-17 MED ORDER — OMEPRAZOLE 20 MG PO CPDR
20.0000 mg | DELAYED_RELEASE_CAPSULE | Freq: Every day | ORAL | 1 refills | Status: AC
Start: 2024-04-17 — End: ?

## 2024-04-17 MED ORDER — AMPHETAMINE-DEXTROAMPHET ER 20 MG PO CP24: 20.0000 mg | ORAL_CAPSULE | Freq: Every day | ORAL | 0 refills | Status: DC

## 2024-04-17 MED ORDER — SERTRALINE HCL 50 MG PO TABS: 50.0000 mg | ORAL_TABLET | Freq: Every day | ORAL | 0 refills | Status: DC

## 2024-04-17 NOTE — Progress Notes (Signed)
 Name: Shelby Barry   MRN: [id number]    DOB: 12/04/1965   Date:04/17/2024       Progress Note  Subjective  Chief Complaint  Chief Complaint  Patient presents with   Medical Management of Chronic Issues   HPI   ADD combined type: she told me she always felt that she had ADD so we referred her for formal testing , she saw [facility] in [place] and the diagnosis was positive for ADD She states Adderal XR helps her focus, she feels less anxious when she takes medication and feels more accomplished . 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 Currently seeing cardiologist , Dr. Merlyn Starring , [facility] provider. Denies orthopnea, denies sob with activity since taking Demadex . Denies chest pain or palpitation. She states Echo up to date but I cannot see recent echo, Dr. Ceola Collie note states combined systolic and diastolic dysfunction   Major depression chronic recurrent: she has a long history of depression over the years, her father committed suicide when she was in her 63's.  She placed her mother in the nursing home Cannot tolerate Lexapro , she was taking zoloft  helped with symptoms but ran out medication and states only wants to take prn medication. She was really stressed since her grown son moved back in with her and her sister in the  Spring of 2023 . He is a Clinical research associate but has been behaving differently. Change in personality, hanging out with homeless people, he was giving all his belongings away. He is doing better now. She is living with her sister  She is feeling more depressed lately, she states willing to take SSRI again. Worried about her health. Taking buspar  prn    History of breast cancer diagnosed in 2017 : still seeing hematologist, stopped anastrozole  but could not tolerate medication. . She is up to date with mammogram, last mammogram was  12/24, during last visit with oncologist she was found to have iron deficiency anemia 07/2023 , she was seen by GI  had EGD and colonoscopy diagnosed with GERD / hiatal hernia and esophagitis and had one polyp. She is taking Omeprazole  daily . Anemia resolved, no longer taking iron supplementation   COPD with hyporesponsiveness also Asthma she was using Advair but stopped after an episode of thrush,, she tried Trelegy and stopped that also.  She quit smoking. She has a daily cough that is worse int he morning.    Hoarseness and otalgia: she was seen by ENT, using a nasal spray that caused her nose to bleed but states while using medication , hoarseness resolved for a period of time, but is hoarse again.   AR perennial : taking loratadine     Recent fall at home on 4 days ago ( 23 rd of may) , she washed the area and is healing, some erythema , Tdap is up to date   Patient Active Problem List   Diagnosis Date Noted   Absolute anemia 08/10/2023   Hyperplastic polyp of intestine 08/10/2023   COPD with bronchial hyperresponsiveness (HCC) 02/01/2023   Attention deficit hyperactivity disorder (ADHD), combined type 02/01/2023   Asthma, well controlled 02/01/2023   Obesity (BMI 30.0-34.9) 05/18/2018   Liver lesion 11/09/2016   Chronic bronchitis (HCC) 10/29/2016   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, estrogen receptor positive (HCC) 10/26/2016   Major depression, recurrent,  chronic (HCC) 08/22/2015   Bursitis of shoulder 08/22/2015   Bilateral cataracts 08/22/2015   Circadian rhythm sleep disorder, 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   Gastroesophageal reflux disease without esophagitis 08/22/2015   Vitamin D  deficiency 08/22/2015   Low back pain 08/22/2015   GAD (generalized anxiety disorder) 08/22/2015   History of anemia 08/17/2007   Insomnia 08/16/2007    Past Surgical History:  Procedure Laterality Date   ABDOMINAL HYSTERECTOMY  04/20/2014   BILATERAL SALPINGECTOMY      BIOPSY  08/10/2023   Procedure: BIOPSY;  Surgeon: Luke Salaam, MD;  Location: [facility] ENDOSCOPY;  Service: Gastroenterology;;   BREAST BIOPSY Right 09/27/2016   positive   BREAST LUMPECTOMY Right 10/12/2016   positive   BREAST LUMPECTOMY WITH SENTINEL LYMPH NODE BIOPSY Right 10/12/2016   Procedure: BREAST LUMPECTOMY WITH SENTINEL LYMPH NODE BX;  Surgeon: Jerlean Mood, MD;  Location: [facility] ORS;  Service: General;  Laterality: Right;   COLONOSCOPY WITH PROPOFOL  N/A 05/20/2016   Procedure: COLONOSCOPY WITH PROPOFOL ;  Surgeon: Marnee Sink, MD;  Location: [facility];  Service: Endoscopy;  Laterality: N/A;   COLONOSCOPY WITH PROPOFOL  N/A 08/10/2023   Procedure: COLONOSCOPY WITH PROPOFOL ;  Surgeon: Luke Salaam, MD;  Location: [facility] ENDOSCOPY;  Service: Gastroenterology;  Laterality: N/A;   ECTOPIC PREGNANCY SURGERY     ENDOMETRIAL ABLATION     ESOPHAGOGASTRODUODENOSCOPY (EGD) WITH PROPOFOL  N/A 08/10/2023   Procedure: ESOPHAGOGASTRODUODENOSCOPY (EGD) WITH PROPOFOL ;  Surgeon: Luke Salaam, MD;  Location: [facility] ENDOSCOPY;  Service: Gastroenterology;  Laterality: N/A;   POLYPECTOMY  08/10/2023   Procedure: POLYPECTOMY;  Surgeon: Luke Salaam, MD;  Location: [facility] ENDOSCOPY;  Service: Gastroenterology;;   PORTACATH PLACEMENT Left 11/04/2016   Procedure: INSERTION PORT-A-CATH;  Surgeon: Jerlean Mood, MD;  Location: [facility] ORS;  Service: General;  Laterality: Left;  Left subclavian vein   TUBAL LIGATION      Family History  Problem Relation Age of Onset   Heart disease Mother    Depression Father    Obesity Father    Diabetes Father    Alcohol abuse Father    COPD Father    Alcohol abuse Brother    Seizures Brother    Breast cancer Sister 70    Social History   Tobacco Use   Smoking status: Former    Current packs/day: 0.00    Average packs/day: 0.5 packs/day for 20.0 years (10.0 ttl pk-yrs)    Types: Cigarettes    Start date: 08/23/1996    Quit date: 08/23/2016    Years since  quitting: 7.6   Smokeless tobacco: Never  Substance Use Topics   Alcohol use: Yes    Alcohol/week: 0.0 standard drinks of alcohol    Comment: occassional     Current Outpatient Medications:    acetaminophen  (TYLENOL ) 325 MG tablet, Take 325 mg by mouth every 6 (six) hours as needed for fever., Disp: , Rfl:    albuterol  (VENTOLIN  HFA) 108 (90 Base) MCG/ACT inhaler, INHALE 1 TO 2 PUFFS BY MOUTH EVERY 6 HOURS AS NEEDED FOR WHEEZING FOR SHORTNESS OF BREATH, Disp: 9 g, Rfl: 0   amphetamine -dextroamphetamine (ADDERALL XR) 20 MG 24 hr capsule, Take 1 capsule (20 mg total) by mouth daily., Disp: 30 capsule, Rfl: 0   amphetamine -dextroamphetamine (ADDERALL XR) 20 MG 24 hr capsule, Take 1 capsule (20 mg total) by mouth daily., Disp: 30 capsule, Rfl: 0   amphetamine -dextroamphetamine (ADDERALL XR) 20 MG 24 hr capsule, Take 1 capsule (20 mg total) by mouth daily., Disp: 30 capsule, Rfl:  0   busPIRone  (BUSPAR ) 5 MG tablet, Take 1 tablet (5 mg total) by mouth 2 (two) times daily., Disp: 100 tablet, Rfl: 0   empagliflozin (JARDIANCE) 10 MG TABS tablet, Take 10 mg by mouth daily., Disp: , Rfl:    loratadine  (CLARITIN ) 10 MG tablet, Take 1 tablet (10 mg total) by mouth daily., Disp: 30 tablet, Rfl: 2   losartan (COZAAR) 25 MG tablet, Take 25 mg by mouth daily., Disp: , Rfl:    magnesium oxide (MAG-OX) 400 (240 Mg) MG tablet, Take 400 mg by mouth daily., Disp: , Rfl:    metoprolol succinate (TOPROL-XL) 25 MG 24 hr tablet, Take 25 mg by mouth daily., Disp: , Rfl:    omeprazole  (PRILOSEC) 20 MG capsule, Take 1 capsule (20 mg total) by mouth daily., Disp: 90 capsule, Rfl: 0   spironolactone (ALDACTONE) 25 MG tablet, Take 25 mg by mouth daily., Disp: , Rfl:    torsemide (DEMADEX) 20 MG tablet, Take by mouth., Disp: , Rfl:    zolpidem  (AMBIEN ) 5 MG tablet, Take 1 tablet (5 mg total) by mouth at bedtime as needed. for sleep, Disp: 90 tablet, Rfl: 0   IRON, FERROUS SULFATE, PO, Take by mouth daily. (Patient not  taking: Reported on 04/17/2024), Disp: , Rfl:   Allergies  Allergen Reactions   Adhesive [Tape] Other (See Comments)    Blisters/rash  PLEASE USE PAPER TAPE ONLY   Nickel     PT CAN WEAR GOLD, STERLING SILVER  WITH NO PROBLEM-PT HAS NEVER HAD ANY PROBLEMS IN THE OR    I personally reviewed active problem list, medication list, allergies, family history with the patient/caregiver today.   ROS  Ten systems reviewed and is negative except as mentioned in HPI    Objective Physical Exam Constitutional: Patient appears well-developed and well-nourished. Obese  No distress.  HEENT: head atraumatic, normocephalic, pupils equal and reactive to light, neck supple Cardiovascular: Normal rate, regular rhythm and normal heart sounds.  No murmur heard. No BLE edema. Pulmonary/Chest: Effort normal and breath sounds normal. No respiratory distress. Abdominal: Soft.  There is no tenderness. Psychiatric: Patient has a normal mood and affect. behavior is normal. Judgment and thought content normal.    CONSTITUTIONAL: Patient appears well-developed and well-nourished. No distress. HEENT: Head atraumatic, normocephalic, neck supple. CARDIOVASCULAR: Normal rate, regular rhythm and normal heart sounds. No murmur heard. No edema in extremities. PULMONARY: Effort normal and breath sounds normal. Lungs clear to auscultation. No respiratory distress. ABDOMINAL: There is no tenderness or distention. MUSCULOSKELETAL: Normal gait. Without gross motor or sensory deficit. PSYCHIATRIC: Patient has a normal mood and affect. Behavior is normal. Judgment and thought content normal. SKIN: Skin not infected.  Vitals:   04/17/24 0945  BP: 110/70  Pulse: 95  Resp: 16  SpO2: 95%  Weight: 169 lb 12.8 oz (77 kg)  Height: 5' 1.5" (1.562 m)    Body mass index is 31.56 kg/m.    PHQ2/9:    04/17/2024    9:40 AM 01/16/2024   10:28 AM 09/06/2023   11:00 AM 05/05/2023   11:49 AM 02/01/2023   10:08 AM   Depression screen PHQ 2/9  Decreased Interest 3 0 0 1 1  Down, Depressed, Hopeless 3 0 1 1 1   PHQ - 2 Score 6 0 1 2 2   Altered sleeping 1 0 3 1 1   Tired, decreased energy 1 0 2 0 1  Change in appetite 1 0 0 0 0  Feeling bad or failure about yourself  0 0 0 0 1  Trouble concentrating 1 0 0 0 1  Moving slowly or fidgety/restless 0 0 0 0 0  Suicidal thoughts 0 0 0 0 0  PHQ-9 Score 10 0 6 3 6   Difficult doing work/chores Somewhat difficult Not difficult at all Somewhat difficult Not difficult at all Not difficult at all    phq 9 is positive  Fall Risk:    04/17/2024    9:40 AM 01/16/2024   10:23 AM 09/06/2023   10:58 AM 05/05/2023   11:48 AM 02/01/2023   10:02 AM  Fall Risk   Falls in the past year? 1 0 1 0   Number falls in past yr: 0 0 0 0   Injury with Fall? 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 Moderate mitral and tricuspid regurgitation. Orthopnea and peripheral edema responsive to diuretics. No echocardiogram since 2017. - Continue Jardiance, metoprolol, spironolactone, and torsemide. - Adjust losartan to 12.5 mg daily. - Monitor symptoms and adjust diuretics as needed. - Consider repeat echocardiogram to assess current cardiac function.  Non-ischemic cardiomyopathy Non-ischemic cardiomyopathy, likely secondary to chemotherapy. Symptoms consistent with heart failure. - Consider repeat echocardiogram to assess current cardiac function.  COPD with asthma COPD with asthma, complicated by intolerance to  corticosteroid inhalers due to thrush. Daily cough, more pronounced in the morning. Previously tried Advair and Trelegy but could not tolerate due to side effects. Discussed alternative treatment options, including Spiriva. - Start Spiriva Respimat daily for COPD management. - Monitor for improvement in cough and respiratory symptoms.  Depression Chronic recurrent depression with a family history of suicide. Increased depressive symptoms, likely exacerbated by financial stress and health concerns. Previously tried Lexapro  and Zoloft , with Zoloft  being effective in the past. Discussed restarting Zoloft . - Restart Zoloft  (sertraline ) for depression management. - Monitor mood and depressive symptoms. - Encourage adherence to medication regimen.  Hiatal hernia with esophagitis Hiatal hernia with esophagitis, managed with omeprazole . Reports good control of symptoms. - Continue omeprazole  for symptom management.  History of breast cancer Breast cancer diagnosed in 2017. Up to date with screenings. - Continue regular mammogram screenings annually.

## 2024-05-04 ENCOUNTER — Encounter: Payer: Self-pay | Admitting: Internal Medicine

## 2024-05-28 ENCOUNTER — Inpatient Hospital Stay: Payer: Managed Care, Other (non HMO) | Attending: Internal Medicine

## 2024-05-28 ENCOUNTER — Inpatient Hospital Stay: Payer: Managed Care, Other (non HMO) | Admitting: Internal Medicine

## 2024-05-28 ENCOUNTER — Encounter: Payer: Self-pay | Admitting: Internal Medicine

## 2024-05-28 DIAGNOSIS — Z79899 Other long term (current) drug therapy: Secondary | ICD-10-CM | POA: Insufficient documentation

## 2024-05-28 DIAGNOSIS — F419 Anxiety disorder, unspecified: Secondary | ICD-10-CM | POA: Insufficient documentation

## 2024-05-28 DIAGNOSIS — D509 Iron deficiency anemia, unspecified: Secondary | ICD-10-CM | POA: Insufficient documentation

## 2024-05-28 DIAGNOSIS — M8580 Other specified disorders of bone density and structure, unspecified site: Secondary | ICD-10-CM | POA: Insufficient documentation

## 2024-05-28 DIAGNOSIS — I890 Lymphedema, not elsewhere classified: Secondary | ICD-10-CM | POA: Insufficient documentation

## 2024-05-28 DIAGNOSIS — Z08 Encounter for follow-up examination after completed treatment for malignant neoplasm: Secondary | ICD-10-CM | POA: Insufficient documentation

## 2024-05-28 DIAGNOSIS — Z853 Personal history of malignant neoplasm of breast: Secondary | ICD-10-CM | POA: Insufficient documentation

## 2024-06-19 ENCOUNTER — Inpatient Hospital Stay (HOSPITAL_BASED_OUTPATIENT_CLINIC_OR_DEPARTMENT_OTHER): Admitting: Internal Medicine

## 2024-06-19 ENCOUNTER — Other Ambulatory Visit: Payer: Self-pay

## 2024-06-19 ENCOUNTER — Inpatient Hospital Stay

## 2024-06-19 VITALS — BP 126/77 | HR 70 | Temp 96.6°F | Resp 17 | Ht 61.5 in | Wt 172.0 lb

## 2024-06-19 DIAGNOSIS — I890 Lymphedema, not elsewhere classified: Secondary | ICD-10-CM | POA: Diagnosis not present

## 2024-06-19 DIAGNOSIS — Z79899 Other long term (current) drug therapy: Secondary | ICD-10-CM | POA: Diagnosis not present

## 2024-06-19 DIAGNOSIS — C50511 Malignant neoplasm of lower-outer quadrant of right female breast: Secondary | ICD-10-CM

## 2024-06-19 DIAGNOSIS — Z08 Encounter for follow-up examination after completed treatment for malignant neoplasm: Secondary | ICD-10-CM | POA: Diagnosis present

## 2024-06-19 DIAGNOSIS — Z853 Personal history of malignant neoplasm of breast: Secondary | ICD-10-CM | POA: Diagnosis present

## 2024-06-19 DIAGNOSIS — F419 Anxiety disorder, unspecified: Secondary | ICD-10-CM | POA: Diagnosis not present

## 2024-06-19 DIAGNOSIS — Z170 Estrogen receptor positive status [ER+]: Secondary | ICD-10-CM | POA: Diagnosis not present

## 2024-06-19 DIAGNOSIS — M8580 Other specified disorders of bone density and structure, unspecified site: Secondary | ICD-10-CM | POA: Diagnosis not present

## 2024-06-19 DIAGNOSIS — D509 Iron deficiency anemia, unspecified: Secondary | ICD-10-CM | POA: Diagnosis not present

## 2024-06-19 LAB — IRON AND TIBC
Iron: 145 ug/dL (ref 28–170)
Saturation Ratios: 38 % — ABNORMAL HIGH (ref 10.4–31.8)
TIBC: 382 ug/dL (ref 250–450)
UIBC: 237 ug/dL

## 2024-06-19 LAB — CMP (CANCER CENTER ONLY)
ALT: 16 U/L (ref 0–44)
AST: 16 U/L (ref 15–41)
Albumin: 4 g/dL (ref 3.5–5.0)
Alkaline Phosphatase: 98 U/L (ref 38–126)
Anion gap: 7 (ref 5–15)
BUN: 17 mg/dL (ref 6–20)
CO2: 29 mmol/L (ref 22–32)
Calcium: 9.1 mg/dL (ref 8.9–10.3)
Chloride: 101 mmol/L (ref 98–111)
Creatinine: 0.79 mg/dL (ref 0.44–1.00)
GFR, Estimated: >60 mL/min (ref >60)
Glucose, Bld: 99 mg/dL (ref 70–99)
Potassium: 4.5 mmol/L (ref 3.5–5.1)
Sodium: 137 mmol/L (ref 135–145)
Total Bilirubin: 0.6 mg/dL (ref 0.0–1.2)
Total Protein: 6.6 g/dL (ref 6.5–8.1)

## 2024-06-19 LAB — CBC WITH DIFFERENTIAL (CANCER CENTER ONLY)
Abs Immature Granulocytes: 0.01 K/uL (ref 0.00–0.07)
Basophils Absolute: 0.1 K/uL (ref 0.0–0.1)
Basophils Relative: 1 %
Eosinophils Absolute: 0.3 K/uL (ref 0.0–0.5)
Eosinophils Relative: 4 %
HCT: 44.6 % (ref 36.0–46.0)
Hemoglobin: 14.5 g/dL (ref 12.0–15.0)
Immature Granulocytes: 0 %
Lymphocytes Relative: 24 %
Lymphs Abs: 1.7 K/uL (ref 0.7–4.0)
MCH: 29.4 pg (ref 26.0–34.0)
MCHC: 32.5 g/dL (ref 30.0–36.0)
MCV: 90.3 fL (ref 80.0–100.0)
Monocytes Absolute: 0.5 K/uL (ref 0.1–1.0)
Monocytes Relative: 7 %
Neutro Abs: 4.6 K/uL (ref 1.7–7.7)
Neutrophils Relative %: 64 %
Platelet Count: 273 K/uL (ref 150–400)
RBC: 4.94 MIL/uL (ref 3.87–5.11)
RDW: 13.7 % (ref 11.5–15.5)
WBC Count: 7.1 K/uL (ref 4.0–10.5)
nRBC: 0 % (ref 0.0–0.2)

## 2024-06-19 LAB — FERRITIN: Ferritin: 142 ng/mL (ref 11–307)

## 2024-06-19 NOTE — Progress Notes (Signed)
 [facility] OFFICE PROGRESS NOTE  Patient Care Team: Glenard Mire, [name], [name], [name], [name], [name], [name]    Oncology History Overview Note  # DEC 2017- RIGHT BREAST Stage II [pT1cpN1sn; screening] ER/PR- Pos- 90%; her 2 NEU- NEG s/p Lumpect & SLNBx; Dr.Sankar ]; JAN 2018- ddAC -T x12. AUG 2nd [finished RT].  # AUG 21st 2018- Start TAM; Nov 2018- Stopped sec intol/mood swings; arthralgias];  DEC 2019- Arimidex  1 mg/day; STOPPED JAN 2020 sec to intolerarnce.   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 Patient ambulating-independently.    [facility] 58 y.o.  female pleasant patient above history of stage II breast cancer ER PR positive currently NOT on adjuvant endocrine therapy [sec to intolerance]  nonischemic cardiomyopathy [Dr. Jama [facility]]  Patient continues to complain of extreme fatigue attributed to her congestive heart failure.  Patient also stated that she received iron infusion at [facility].  Denies any blood in stools; or black colored stools.  Denies any worsening joint pains. No fever and chills.  Patient is upset overall-given expected complication of cardiomyopathy.  Review of Systems  Constitutional:  Positive for malaise/fatigue. Negative for chills, diaphoresis, fever and weight  loss.  HENT:  Negative for nosebleeds and sore throat.   Eyes:  Negative for double vision.  Respiratory:  Negative for hemoptysis and sputum production.   Cardiovascular:  Negative for chest pain, palpitations, orthopnea and leg swelling.  Gastrointestinal:  Positive for nausea and vomiting. Negative for blood in stool, constipation, diarrhea, heartburn and melena.  Genitourinary:  Negative for dysuria, frequency and urgency.  Musculoskeletal:  Negative for back pain.  Skin: Negative.  Negative for itching and rash.  Neurological:  Negative for dizziness, tingling, focal weakness, weakness and headaches.  Endo/Heme/Allergies:  Does not bruise/bleed easily.      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, estrogen receptor positive (HCC)    Cataract, bilateral    Chemotherapy induced nausea and vomiting    Cold    states she has had it for 2 months   Depression    GERD (gastroesophageal reflux disease)    uses baking soda for symptoms   Insomnia    Lumbago    Personal history of chemotherapy 11/22/2016   Personal history of radiation therapy 01/20/2017   Vitamin D  deficiency     PAST SURGICAL HISTORY :   Past Surgical History:  Procedure Laterality Date   ABDOMINAL HYSTERECTOMY  04/20/2014   BILATERAL SALPINGECTOMY     BIOPSY  08/10/2023   Procedure: BIOPSY;  Surgeon: Therisa Bi, MD;  Location: [facility];  Service: Gastroenterology;;   BREAST BIOPSY Right 09/27/2016   positive   BREAST LUMPECTOMY Right 10/12/2016   positive   BREAST LUMPECTOMY WITH SENTINEL LYMPH NODE BIOPSY Right 10/12/2016   Procedure: BREAST LUMPECTOMY WITH SENTINEL LYMPH NODE BX;  Surgeon: Louanne KANDICE Muse, MD;  Location: [facility];  Service: General;  Laterality: Right;   COLONOSCOPY  WITH PROPOFOL  N/A 05/20/2016   Procedure: COLONOSCOPY WITH PROPOFOL ;  Surgeon: Rogelia Copping, MD;  Location: [facility];  Service: Endoscopy;  Laterality: N/A;   COLONOSCOPY WITH PROPOFOL  N/A 08/10/2023   Procedure: COLONOSCOPY WITH PROPOFOL ;  Surgeon: Therisa Bi, MD;  Location: [facility];  Service: Gastroenterology;  Laterality: N/A;   ECTOPIC PREGNANCY SURGERY     ENDOMETRIAL ABLATION     ESOPHAGOGASTRODUODENOSCOPY (EGD) WITH PROPOFOL  N/A 08/10/2023   Procedure: ESOPHAGOGASTRODUODENOSCOPY (EGD) WITH PROPOFOL ;  Surgeon: Therisa Bi, MD;  Location[facility];  Service: Gastroenterology;  Laterality: N/A;   POLYPECTOMY  08/10/2023   Procedure: POLYPECTOMY;  Surgeon: Therisa Bi, MD;  Location: [facility];  Service: Gastroenterology;;   PORTACATH PLACEMENT Left 11/04/2016   Procedure: INSERTION PORT-A-CATH;  Surgeon: Louanne KANDICE Muse, MD;  Location: [facility];  Service: General;  Laterality: Left;  Left subclavian vein   TUBAL LIGATION      FAMILY HISTORY :   Family History  Problem Relation Age of Onset   Heart disease Mother    Depression Father    Obesity Father    Diabetes Father    Alcohol abuse Father    COPD Father    Alcohol abuse Brother    Seizures Brother    Breast cancer Sister 51    SOCIAL HISTORY:   Social History   Tobacco Use   Smoking status: Former    Current packs/day: 0.00    Average packs/day: 0.5 packs/day for 20.0 years (10.0 ttl pk-yrs)    Types: Cigarettes    Start date: 08/23/1996    Quit date: 08/23/2016    Years since quitting: 7.8   Smokeless tobacco: Never  Vaping Use   Vaping status: Never Used  Substance Use Topics   Alcohol use: Yes    Alcohol/week: 0.0 standard drinks of alcohol    Comment: occassional   Drug use: No    ALLERGIES:  is allergic to adhesive [tape] and nickel.  MEDICATIONS:  Current Outpatient Medications  Medication Sig Dispense Refill   acetaminophen  (TYLENOL ) 325 MG tablet Take 325 mg by mouth  every 6 (six) hours as needed for fever.     albuterol  (VENTOLIN  HFA) 108 (90 Base) MCG/ACT inhaler INHALE 1 TO 2 PUFFS BY MOUTH EVERY 6 HOURS AS NEEDED FOR WHEEZING FOR SHORTNESS OF BREATH 9 g 0   amphetamine -dextroamphetamine (ADDERALL XR) 20 MG 24 hr capsule Take 1 capsule (20 mg total) by mouth daily. (Patient taking differently: Take 20 mg by mouth daily. Takes twice a month because with her heart condition they don't want her taking it too much.) 30 capsule 0   busPIRone  (BUSPAR ) 5 MG tablet Take 1 tablet (5 mg total) by mouth 2 (two) times daily. 100 tablet 0   empagliflozin (JARDIANCE) 10 MG TABS tablet Take 10 mg by mouth daily.     loratadine  (CLARITIN ) 10 MG tablet Take 1 tablet (10 mg total) by mouth daily. 30 tablet 2   losartan (COZAAR) 25 MG tablet Take 12.5 mg by mouth daily.     magnesium oxide (MAG-OX) 400 (240 Mg) MG tablet Take 400 mg by mouth daily.  metoprolol succinate (TOPROL-XL) 25 MG 24 hr tablet Take 25 mg by mouth daily.     omeprazole  (PRILOSEC) 20 MG capsule Take 1 capsule (20 mg total) by mouth daily. 90 capsule 1   sertraline  (ZOLOFT ) 50 MG tablet Take 1 tablet (50 mg total) by mouth daily. 90 tablet 0   spironolactone (ALDACTONE) 25 MG tablet Take 0.5 tablets by mouth daily.     Tiotropium Bromide Monohydrate  (SPIRIVA  RESPIMAT) 2.5 MCG/ACT AERS Inhale 2 puffs into the lungs daily. 12 g 1   torsemide (DEMADEX) 20 MG tablet Take by mouth. (Patient taking differently: Take 20 mg by mouth once. Takes as needed)     zolpidem  (AMBIEN ) 5 MG tablet Take 1 tablet (5 mg total) by mouth at bedtime as needed. for sleep 30 tablet 2   amphetamine -dextroamphetamine (ADDERALL XR) 20 MG 24 hr capsule Take 1 capsule (20 mg total) by mouth daily. (Patient not taking: Reported on 06/19/2024) 30 capsule 0   amphetamine -dextroamphetamine (ADDERALL XR) 20 MG 24 hr capsule Take 1 capsule (20 mg total) by mouth daily. 30 capsule 0   torsemide (DEMADEX) 20 MG tablet Take 40 mg by mouth 2  (two) times daily.     No current facility-administered medications for this visit.    PHYSICAL EXAMINATION: ECOG PERFORMANCE STATUS: 0 - Asymptomatic  BP 126/77 (BP Location: Left Arm, Patient Position: Sitting, Cuff Size: Normal)   Pulse 70   Temp (!) 96.6 F (35.9 C) (Tympanic)   Resp 17   Ht 5' 1.5 (1.562 m)   Wt 172 lb (78 kg)   SpO2 97%   BMI 31.97 kg/m   Filed Weights   06/19/24 1313  Weight: 172 lb (78 kg)      Physical Exam HENT:     Head: Normocephalic and atraumatic.     Mouth/Throat:     Pharynx: No oropharyngeal exudate.  Eyes:     Pupils: Pupils are equal, round, and reactive to light.  Cardiovascular:     Rate and Rhythm: Normal rate and regular rhythm.  Pulmonary:     Effort: No respiratory distress.     Breath sounds: No wheezing.  Abdominal:     General: Bowel sounds are normal. There is no distension.     Palpations: Abdomen is soft. There is no mass.     Tenderness: There is no guarding or rebound.     Comments: Mild tenderness in the right upper quadrant.  Musculoskeletal:        General: No tenderness. Normal range of motion.     Cervical back: Normal range of motion and neck supple.  Skin:    General: Skin is warm.     Comments:    Neurological:     Mental Status: She is alert and oriented to person, place, and time.  Psychiatric:        Mood and Affect: Affect normal.     LABORATORY DATA:  I have reviewed the data as listed    Component Value Date/Time   NA 137 06/19/2024 1307   NA 141 07/08/2022 1118   K 4.5 06/19/2024 1307   CL 101 06/19/2024 1307   CO2 29 06/19/2024 1307   GLUCOSE 99 06/19/2024 1307   BUN 17 06/19/2024 1307   BUN 17 07/08/2022 1118   CREATININE 0.79 06/19/2024 1307   CREATININE 0.74 11/20/2021 1346   CALCIUM 9.1 06/19/2024 1307   PROT 6.6 06/19/2024 1307   PROT 6.2 07/08/2022 1118   ALBUMIN 4.0 06/19/2024 1307  ALBUMIN 4.5 07/08/2022 1118   AST 16 06/19/2024 1307   ALT 16 06/19/2024 1307    ALKPHOS 98 06/19/2024 1307   BILITOT 0.6 06/19/2024 1307   GFRNONAA >60 06/19/2024 1307   GFRAA 108 02/22/2020 1331    No results found for: SPEP, UPEP  Lab Results  Component Value Date   WBC 7.1 06/19/2024   NEUTROABS 4.6 06/19/2024   HGB 14.5 06/19/2024   HCT 44.6 06/19/2024   MCV 90.3 06/19/2024   PLT 273 06/19/2024      Chemistry      Component Value Date/Time   NA 137 06/19/2024 1307   NA 141 07/08/2022 1118   K 4.5 06/19/2024 1307   CL 101 06/19/2024 1307   CO2 29 06/19/2024 1307   BUN 17 06/19/2024 1307   BUN 17 07/08/2022 1118   CREATININE 0.79 06/19/2024 1307   CREATININE 0.74 11/20/2021 1346      Component Value Date/Time   CALCIUM 9.1 06/19/2024 1307   ALKPHOS 98 06/19/2024 1307   AST 16 06/19/2024 1307   ALT 16 06/19/2024 1307   BILITOT 0.6 06/19/2024 1307       RADIOGRAPHIC STUDIES: I have personally reviewed the radiological images as listed and agreed with the findings in the report. No results found.   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   No evidence of any recurrence.  BIL mammo- FEB 2025-  WNL. Stable.   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 Recommend close follow up with cardiology. Stable.   # Iron def Anemia- last colonoscopy- 2017 [Dr.Wohl]- no blood in stools. On PO iron-;-AUG 2024- with Dr.Wohl- re EGD/colo- NEGATIVE- stable.s/p IV ferrahem in [facility] July 2025- [per cards] - defer to PCP/  # Right UE lymphedema- G-1 stable.   #Osteopenia T score -2.2 [JAN 2025-]- stable; declines calcium plus vitamin-    # Anxiety- stable.   # DISPOSITION: # follow up as needed- Dr.B    No orders of the defined types were placed in this encounter.  All questions were answered. The patient knows to call the clinic with any problems, questions or concerns.      Cindy JONELLE Joe, MD 06/19/2024 2:00 PM

## 2024-06-19 NOTE — Progress Notes (Signed)
 Throwns up once daily, sometimes goes a day or two w/o. Doesn't take medicine. Says it's been the same for years. Leg pain 1/10 from bumping into things.

## 2024-06-19 NOTE — Assessment & Plan Note (Addendum)
#   Breast cancer-stage II ER/PR positive HER-2/neu negative; declines Enodcrine therapy sec to intolerance.   No evidence of any recurrence.  BIL mammo- FEB 2025-  WNL. Stable.   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 Recommend close follow up with cardiology. Stable.   # Iron def Anemia- last colonoscopy- 2017 [Dr.Wohl]- no blood in stools. On PO iron-;-AUG 2024- with Dr.Wohl- re EGD/colo- NEGATIVE- stable.s/p IV ferrahem in [facility] July 2025- [per cards] - defer to PCP/  # Right UE lymphedema- G-1 stable.   #Osteopenia T score -2.2 [JAN 2025-]- stable; declines calcium plus vitamin-   # Anxiety- stable.   # Patient was quite upset-tearful with her diagnosis of nonischemic cardiomyopathy-attributed to adriamycin .  She understands that cardiomyopathy is extremely rare-and no way to predict in her situation.   #Since patient is clinically stable I think is reasonable for the patient to follow-up with PCP/can follow-up with us  as needed.  Patient comfortable with the plan; to call us  if any questions or concerns in the interim.  She will need to get annual mammograms with PCP.   # DISPOSITION: # follow up as needed- Dr.B

## 2024-06-20 LAB — CANCER ANTIGEN 27.29: CA 27.29: 16.8 U/mL (ref 0.0–38.6)

## 2024-06-25 ENCOUNTER — Encounter: Payer: Self-pay | Admitting: Internal Medicine

## 2024-07-10 NOTE — Progress Notes (Signed)
 [facility] Note  Referring Provider: Jama Old, MD [address] [place],  [place] [address]  Primary Provider: Glenard Dorette Orem, MD [address] [place] [address]  Other Providers:  Reason for Visit: Shelby Barry is a 58 y.o. female being seen today 07/10/2024 for follow up in the [facility] HF clinic visit for evaluation/management of her heart failure  Assessment & Plan: 58 y.o. female with a PMH of NICM with HFrEF (LVEF 15-20%, LVIDd 6.4 in Nov 2024; no ICD), h/o Breast Cancer (s/p chemoradiation with Anthracycline in 2018), Anxiety d/o who is referred to [facility] for consultative services regarding heart failure evaluation.  # NICM with HFrEF: # Nonischemic cardiomyopathy, chemotherapy and/or stimulant-associated: -- NYHA III, ACC/AHA stage C, clinically euvolemic -- Etiology is nonischemic.  Received anthracycline in 2018 for breast cancer, most likely cause of her HF.  Also on Adderall for 3 to 4 years preceding HF diagnosis.  LHC in Nov 2024 with no CAD. -- Most recent TTE in Nov 2024: LVEF 15-20%, LVIDd 6.4, IVSd 0.9, RAP 15, RVSP 54, mod posterior MR, mild RV dysfunction, G2DD -- CMR in Feb 2025: LVEF 21%, subtle mid LV wall LGE, no ischemia or infarct -- in April 2025: 420 total meters = 1,377.6 total feet walked -- Optimize GDMT:  - Continue losartan 25mg  daily  - Continue Spironolactone 25 daily  - Continue Metoprolol Succinate 25mg  daily - Continue Jardiance 10 daily  -- Volume mgmt: Torsemide 20 daily prn  -- Labs 05/2024: Na 139, K 3.8, BUN 13, Cr 0.60, ProBNP 250 -- continue potassium 99mg  once daily  -- given progression of NYHA class, significant symptoms even with menial tasks and increasing diuretic requirement, plan for repeat echo and cardiopulmonary stress test  -- completed IV iron infusion 05/31/24 - improvement for 2 days though no longstanding improvement   -- Discussed stopping  Adderall, EtOH  # History of HER- Breast Cancer: Diagnosed late 2017, underwent chemotherapy-radiation therapy in early 2018 with ddAC (unclear dosing) and has been in remission, with last check in mid-2024. Follows with oncology at [facility].  Follow-up: RTC for echo and VO2 in 3 weeks   Attestation: I attest that I have reviewed the Physician Assistant Student's Oscar Hawk) note and that the components of the history of the present illness, the physical exam, and the assessment and plan documented were performed by me or were performed in my presence by the student where I verified the documentation and performed (or re-performed) the exam and medical decision making. Lauraine KATHEE Samples, ANP   History of Present Illness: History of Present Illness Shelby Barry is a 58 y.o. female with congestive heart failure who presents with worsening shortness of breath and medication management issues.   She experiences worsening shortness of breath, particularly during activities such as walking upstairs or carrying groceries. This symptom has been progressively worsening since late 2023, initially occurring occasionally but now more frequent and noticeable.  She was diagnosed with congestive heart failure in August of the previous year after experiencing chest pains, having brief ED presentation with suggestion of heart failure but declined admission.  Ultimately, she was hospitalized in November at [facility] for four days, having heart cath with no CAD and RHC with normal output and index 2.4 by Fick, started on some GDMT. Her heart condition was determined not to be directly related to her previous chemotherapy treatment for breast cancer.  She had not been consistent  with her heart medications due to cost and side effects. She also takes Adderall sporadically for concentration, only over the last several years  She has a history of breast cancer diagnosed in 2017, with treatment completed in  2018. She reports ongoing issues with feeling full easily and having an upset stomach, which she attributes to her cancer treatment.  She was seen here 03/28/24 for follow up after medications changes. She had been only been taking metoprolol 12.5 mg daily (instead of 25mg ) and she was taking Losartan, spiro and Jardiance. I increased her Metoprolol to 25mg  daily and scheduled close follow up. She was contacted by Marshia Jude 05/24/24 and her Entresto was decreased to prn only. She was then seen 06/04/24 for follow up and was doing okay. No medication changes made. She had not required any torsemide since it was changed to prn ~10 days prior to that visit.   Interval history: Patient states she typically does not experience lower extremity swelling with HF, though she did notice since her last visit in July that she began gaining weight. Initially she was unsure if this was diet related, however then she developed a nocturnal cough and continued to gain weight, which prompted her to restart her Torsemide. She has since continued her Torsemide, taking every 2-3 days for ongoing symptom management. Her dyspnea notably improves after Torsemide. She otherwise denies chest pain, palpitations, lower extremity edema and abdominal distension. She does however report ongoing, significant fatigue and shortness of breath even with menial tasks including sweeping/mopping and changing bed sheets. Her mood as such has been low, and she feels that she is, in limbo.   She continues to make dietary modifications where she is able. She has made adjustments to include cooking a vast majority of her foods at home to monitor her sodium intake and have better control over her diet. She still has difficulty with exercise secondary to her fatigue and shortness of breath, though tries to ambulate around the house as much as she is able.           Cardiovascular History & Procedures: 1.  HFrEF - NICM  Cath / PCI: LHC/RHC in Nov  2024: No significant coronary disease LVEDP 20 RA 4, RV 36/3, PA 37/20 (25), PCWP 18 Fick CO/CI 4.2/2.4, PA sat 63% PVR 1.67  CV Surgery:  None  EP Procedures and Devices: None  Non-Invasive Evaluation(s): Echo: TTE in Nov 2024: The left ventricle is moderately dilated in size with normal wall thickness. The left ventricular systolic function is severely decreased, LVEF is visually estimated at 15-20%. There is grade II diastolic dysfunction (elevated filling pressure). The mitral valve leaflets are mildly thickened with normal leaflet mobility. Mitral annular calcification is present (mild). There is moderate posteriorly directed mitral regurgitation. The aortic valve is poorly visualized with probably normal excursion with mildly thickened leaflets with normal excursion.  The left atrium is moderately dilated in size. The right ventricle is normal in size, with mildly reduced systolic function. There is moderate pulmonary hypertension. The right atrium is mildly dilated in size. IVC size and inspiratory change suggest elevated right atrial pressure. (10-20 mmHg). There is no evidence of an interatrial flow communication or intrapulmonary shunt by agitated saline study.  TTE in Dec 2017 [facility]): LVEF 55-60%, mild LVH, LVIDd 4.7  Cardiac CT/MRI/Nuclear Tests:  CMR in Feb 2025: Dilated left ventricle with severely reduced resting systolic function LVEF of 21%. Subtle mid wall linear left ventricular myocardial LGE can be suggestive of  dilated cardiomyopathy. No evidence to suggest ischemic or infarction. Mitral regurgitation jet with mildly dilated left atrium.  6 Minute Walk: 02/27/24: 420 total meters = 1,377.6 total feet walked.  Reported dyspnea, chest tightness.  Baseline heart rate 120 bpm, post walk heart rate 133 bpm  Cardiopulmonary Stress Tests:  None  Other Past Medical History: See below for the complete EPIC list of past medical and surgical history.     Current medications: Current Outpatient Medications on File Prior to Visit  Medication Sig  . albuterol  HFA 90 mcg/actuation inhaler Inhale 1-2 puffs.  . busPIRone  (BUSPAR ) 5 MG tablet TAKE 1 TO 2 TABLETS BY MOUTH ONCE DAILY AS NEEDED (Patient not taking: Reported on 06/04/2024)  . dextroamphetamine-amphetamine  (ADDERALL XR) 20 MG 24 hr capsule Take 1 capsule (20 mg total) by mouth daily. (Patient taking differently: Take 1 capsule (20 mg total) by mouth daily. prn)  . empagliflozin (JARDIANCE) 10 mg tablet Take 1 tablet (10 mg total) by mouth daily.  . loratadine  (CLARITIN ) 10 mg tablet Take 1 tablet (10 mg total) by mouth daily. (Patient taking differently: Take 1 tablet (10 mg total) by mouth in the morning. PRN.)  . losartan (COZAAR) 25 MG tablet Take 1 tablet (25 mg total) by mouth daily.  . magnesium oxide (MAG-OX) 400 mg (241.3 mg elemental magnesium) tablet Take 1 tablet (400 mg total) by mouth daily.  . metoPROLOL succinate (TOPROL-XL) 25 MG 24 hr tablet Take 1 tablet (25 mg total) by mouth daily.  . multivitamin (TAB-A-VITE/THERAGRAN) per tablet Take 1 tablet by mouth daily. (Patient not taking: Reported on 06/04/2024)  . omeprazole  (PRILOSEC) 20 MG capsule Take 1 capsule (20 mg total) by mouth daily.  . potassium gluconate 595 mg (99 mg) Tab Take 2 tablets (1,190 mg total) by mouth daily.  SABRA spironolactone (ALDACTONE) 25 MG tablet Take 1 tablet (25 mg total) by mouth in the morning.  . torsemide (DEMADEX) 20 MG tablet Take 1 tablet (20 mg total) by mouth daily as needed. if weight is > 2 lbs in 1 day or 5 lbs in 1 week  . zolpidem  (AMBIEN ) 5 MG tablet Take 1 tablet (5 mg total) by mouth nightly as needed.   No current facility-administered medications on file prior to visit.   Allergies: Adhesive tape-silicones and Nickel  Family History: The patient's family history includes Depression in her father; Diabetes in her father; Emphysema in her father; Hypertension in her father;  Suicidality in her father. Mother with CABG at 25 yo, smoked ~70 years, died at 73 Father with COPD Healthy kids - 26 and 43 yo  Social History: She  reports that she has quit smoking. Her smoking use included cigarettes. She has never used smokeless tobacco. She reports current alcohol use of about 8.0 standard drinks of alcohol per week. She reports that she does not use drugs. 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  Rest of the review of systems is negative or unremarkable except as stated above.  Physical Exam: VITAL SIGNS:  Vitals:   07/10/24 1512  BP: 112/79  Pulse: 90  SpO2: 93%    Wt Readings from Last 3 Encounters:  07/10/24 77.4 kg (170 lb 9.6 oz)  06/04/24 77.4 kg (170 lb 9.6 oz)  05/31/24 75.8  kg (167 lb)      Today's Body mass index is 31.71 kg/m.   Height: 156.2 cm (5' 1.5) CONSTITUTIONAL: Patient is well-appearing in no acute distress. Speaks with breathless voice. EYES: Conjunctivae and sclerae clear and anicteric. ENT: Benign.  CARDIOVASCULAR: JVP not seen above the clavicle with HOB at 90 degrees. Rate and rhythm are regular.  PMI is normal.  There is no lifts or heaves.  Normal S1, S2. There is no murmur, gallops or rubs.  Radial and pedal pulses are 2+, bilaterally. There is no pedal edema, bilaterally.  RESPIRATORY: Normal respiratory effort. Clear to auscultation bilaterally. There are wheezes best heard in the bilateral lower lobes. GASTROINTESTINAL: Soft, non-tender, with audible bowel sounds. Abdomen nondistended.  Liver is nonpalpable. SKIN: No rashes, ecchymosis or petechiae.  Warm, well perfused.  MUSCULOSKELETAL:  no joint swelling  NEURO/PSYCH: Appropriate mood and affect. Alert and oriented to person, place, and time. No gross motor or sensory deficits  evident.  Labs & Imaging: Reviewed in EPIC.   Lab Results  Component Value Date   PRO-BNP 250.0 06/04/2024   PRO-BNP 270.0 05/09/2024   PRO-BNP 801.0 (H) 03/28/2024   BNP 448.37 (H) 07/20/2023   Creatinine 0.60 06/04/2024   Creatinine 0.72 05/09/2024   Creatinine 0.63 03/28/2024   BUN 13 06/04/2024   BUN 13 05/09/2024   BUN 14 03/28/2024   Sodium 139 06/04/2024   Sodium 140 05/09/2024   Sodium 140 03/28/2024   Potassium 3.8 06/04/2024   Potassium 4.1 05/09/2024   Potassium 2.6 (LL) 03/28/2024   Magnesium 2.4 05/09/2024   Magnesium 2.0 10/20/2023   Magnesium 1.9 10/19/2023   Total Bilirubin 0.6 02/27/2024   Total Bilirubin 0.5 10/17/2023   INR 1.15 10/16/2023    Lab Results  Component Value Date   TSH 1.592 02/27/2024   Cholesterol, Total 212 (H) 02/27/2024   Triglycerides 173 (H) 02/27/2024   Cholesterol, HDL 80 (H) 02/27/2024   Cholesterol, Non-HDL, Calculated 132 (H) 02/27/2024   Cholesterol, LDL, Calculated 97 02/27/2024    Lab Results  Component Value Date   WBC 9.7 02/27/2024   HGB 15.5 (H) 02/27/2024   HCT 45.2 (H) 02/27/2024   Platelet 306 02/27/2024     Other pertinent records/tests were reviewed.  The following are further history from the patient's EPIC record for reference:  Past Medical History:  Diagnosis Date  . Asthma, well controlled (HHS-HCC) 02/01/2023  . Attention deficit hyperactivity disorder (ADHD), combined type 02/01/2023  . 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, estrogen receptor positive     10/26/2016  . COPD with bronchial hyperresponsiveness     02/01/2023  . Ectopic pregnancy (HHS-HCC)   . Gastroesophageal reflux disease without esophagitis 08/22/2015  . Heart failure with reduced ejection fraction     10/16/2023  . Nonischemic cardiomyopathy     03/28/2024   Past Surgical History:  Procedure Laterality Date  . HYSTERECTOMY    . PR CATH PLACE/CORON ANGIO, IMG SUPER/INTERP,R&L HRT CATH, L HRT VENTRIC  N/A 10/19/2023   Procedure: Left/Right Heart Catheterization;  Surgeon: Gil Franky Barter, MD;  Location: [facility] Cath;  Service: Cardiology

## 2024-07-12 NOTE — Progress Notes (Signed)
 Outpatient Social Work   Patient Information     Patient Name: Tifanie Gardiner  Clinic: Cardiology  Primary Coverage: Payor: [place] [employer] CAID [place] COMPLETE HEALTH / Plan: [place] [employer] CAID [place] COMPLETE HEALTH / Product Type: *No Product type* /   Secondary Coverage: None  Financial Assistance Status: Not required at this time  PCP: Glenard Dorette Orem, MD        Reason for Contact: Disability  Psychosocial Update and Case Management: Mylie Mccurley is a 58 y.o. female who is followed by [facility] Cardiology for heart failure. SW referred to assist with disability questions. SW contacted patient and discussed at length, provided basic information including length of disability process and what to expect if approved or denied. Pt verbalized understanding. SW available for further support ongoing. Treatment team notified of consult.   Follow-Up Plan: Continue support as needed Patient was provided with my contact information and instructed to call for any psychosocial needs.   Ozell Daring, MSW, LCSW Clinic Social Worker  Care Management Cardiology & Pulmonology [facility]

## 2024-07-18 ENCOUNTER — Ambulatory Visit (INDEPENDENT_AMBULATORY_CARE_PROVIDER_SITE_OTHER): Admitting: Family Medicine

## 2024-07-18 ENCOUNTER — Encounter: Payer: Self-pay | Admitting: Family Medicine

## 2024-07-18 VITALS — BP 114/76 | HR 98 | Resp 16 | Ht 61.5 in | Wt 170.4 lb

## 2024-07-18 DIAGNOSIS — J4489 Other specified chronic obstructive pulmonary disease: Secondary | ICD-10-CM | POA: Diagnosis not present

## 2024-07-18 DIAGNOSIS — R2242 Localized swelling, mass and lump, left lower limb: Secondary | ICD-10-CM

## 2024-07-18 DIAGNOSIS — I428 Other cardiomyopathies: Secondary | ICD-10-CM | POA: Diagnosis not present

## 2024-07-18 DIAGNOSIS — F5104 Psychophysiologic insomnia: Secondary | ICD-10-CM

## 2024-07-18 DIAGNOSIS — F902 Attention-deficit hyperactivity disorder, combined type: Secondary | ICD-10-CM

## 2024-07-18 DIAGNOSIS — I5022 Chronic systolic (congestive) heart failure: Secondary | ICD-10-CM | POA: Diagnosis not present

## 2024-07-18 DIAGNOSIS — F339 Major depressive disorder, recurrent, unspecified: Secondary | ICD-10-CM

## 2024-07-18 MED ORDER — MODAFINIL 100 MG PO TABS: 100.0000 mg | ORAL_TABLET | Freq: Every day | ORAL | 0 refills | Status: DC

## 2024-07-18 MED ORDER — TEMAZEPAM 7.5 MG PO CAPS: 7.5000 mg | ORAL_CAPSULE | Freq: Every evening | ORAL | 0 refills | Status: DC | PRN

## 2024-07-18 NOTE — Progress Notes (Signed)
 Name: Shelby Barry   MRN: [id number]    DOB: Dec 25, 1965   Date:07/18/2024       Progress Note  Subjective  Chief Complaint  Chief Complaint  Patient presents with   Medical Management of Chronic Issues   Discussed the use of AI scribe software for clinical note transcription with the patient, who gave verbal consent to proceed.  History of Present Illness Shelby Barry is a 58 year old female with heart failure with reduced ejection fraction who presents for a follow-up visit.  She has a subcutaneous nodule on her left lower leg that is red, hard, and not painful or growing. She describes it as 'sticking out even more' and 'red all the time'.  She is currently taking Adderall and Ambien . Adderall helps with focus and anxiety, but she does not take it daily. She also takes sertraline  for depression and feels 'fine'. She has difficulty sleeping, goes to bed at 2 AM, and feels her body is tolerant to Ambien .  She has a history of heart failure with reduced ejection fraction, with an ejection fraction of 15-20% as of 2024. She takes torsemide as needed, losartan 25 mg daily, spironolactone 25 mg daily, metoprolol succinate 25 mg daily, and Jardiance. She experiences some leg swelling and shortness of breath but no orthopnea.  She has a history of iron deficiency and has received an iron infusion.   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 She previously tried Spiriva  but it made her feel worse. Currently only using albuterol  prn   Socially, she is not working and is not on disability/pending. Her sister supports her financially. She is concerned about her son, who is a Clinical research associate but has been experiencing personal difficulties since an accident.    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, estrogen receptor positive (HCC) 10/26/2016   Major depression, recurrent, chronic (HCC) 08/22/2015   Bursitis of shoulder 08/22/2015   Bilateral cataracts 08/22/2015   Circadian rhythm sleep disorder, 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   Gastroesophageal reflux disease without esophagitis 08/22/2015   Vitamin D  deficiency 08/22/2015   Low back pain 08/22/2015   GAD (generalized anxiety disorder) 08/22/2015   History of anemia 08/17/2007   Insomnia 08/16/2007    Past Surgical History:  Procedure Laterality Date   ABDOMINAL HYSTERECTOMY  04/20/2014   BILATERAL SALPINGECTOMY     BIOPSY  08/10/2023   Procedure: BIOPSY;  Surgeon: Therisa Bi, MD;  Location: [facility] ENDOSCOPY;  Service: Gastroenterology;;   BREAST BIOPSY Right 09/27/2016   positive   BREAST LUMPECTOMY Right 10/12/2016   positive   BREAST LUMPECTOMY WITH SENTINEL LYMPH NODE BIOPSY Right 10/12/2016   Procedure: BREAST LUMPECTOMY WITH SENTINEL LYMPH NODE BX;  Surgeon: Louanne KANDICE Muse, MD;  Location: [facility] ORS;  Service: General;  Laterality: Right;   COLONOSCOPY WITH PROPOFOL  N/A 05/20/2016   Procedure: COLONOSCOPY WITH PROPOFOL ;  Surgeon:  Rogelia Copping, MD;  Location: [facility];  Service: Endoscopy;  Laterality: N/A;   COLONOSCOPY WITH PROPOFOL  N/A 08/10/2023   Procedure: COLONOSCOPY WITH PROPOFOL ;  Surgeon: Therisa Bi, MD;  Location: [facility] ENDOSCOPY;  Service: Gastroenterology;  Laterality: N/A;   ECTOPIC PREGNANCY SURGERY     ENDOMETRIAL ABLATION      ESOPHAGOGASTRODUODENOSCOPY (EGD) WITH PROPOFOL  N/A 08/10/2023   Procedure: ESOPHAGOGASTRODUODENOSCOPY (EGD) WITH PROPOFOL ;  Surgeon: Therisa Bi, MD;  Location: [facility] ENDOSCOPY;  Service: Gastroenterology;  Laterality: N/A;   POLYPECTOMY  08/10/2023   Procedure: POLYPECTOMY;  Surgeon: Therisa Bi, MD;  Location: [facility] ENDOSCOPY;  Service: Gastroenterology;;   PORTACATH PLACEMENT Left 11/04/2016   Procedure: INSERTION PORT-A-CATH;  Surgeon: Louanne KANDICE Muse, MD;  Location: [facility] ORS;  Service: General;  Laterality: Left;  Left subclavian vein   TUBAL LIGATION      Family History  Problem Relation Age of Onset   Heart disease Mother    Depression Father    Obesity Father    Diabetes Father    Alcohol abuse Father    COPD Father    Alcohol abuse Brother    Seizures Brother    Breast cancer Sister 82    Social History   Tobacco Use   Smoking status: Former    Current packs/day: 0.00    Average packs/day: 0.5 packs/day for 20.0 years (10.0 ttl pk-yrs)    Types: Cigarettes    Start date: 08/23/1996    Quit date: 08/23/2016    Years since quitting: 7.9   Smokeless tobacco: Never  Substance Use Topics   Alcohol use: Yes    Alcohol/week: 0.0 standard drinks of alcohol    Comment: occassional     Current Outpatient Medications:    acetaminophen  (TYLENOL ) 325 MG tablet, Take 325 mg by mouth every 6 (six) hours as needed for fever., Disp: , Rfl:    albuterol  (VENTOLIN  HFA) 108 (90 Base) MCG/ACT inhaler, INHALE 1 TO 2 PUFFS BY MOUTH EVERY 6 HOURS AS NEEDED FOR WHEEZING FOR SHORTNESS OF BREATH, Disp: 9 g, Rfl: 0   busPIRone  (BUSPAR ) 5 MG tablet, Take 1 tablet (5 mg total) by mouth 2 (two) times daily., Disp: 100 tablet, Rfl: 0   empagliflozin (JARDIANCE) 10 MG TABS tablet, Take 10 mg by mouth daily., Disp: , Rfl:    loratadine  (CLARITIN ) 10 MG tablet, Take 1 tablet (10 mg total) by mouth daily., Disp: 30 tablet, Rfl: 2   losartan (COZAAR) 25 MG tablet, Take 12.5 mg by mouth daily.,  Disp: , Rfl:    magnesium oxide (MAG-OX) 400 (240 Mg) MG tablet, Take 400 mg by mouth daily., Disp: , Rfl:    metoprolol succinate (TOPROL-XL) 25 MG 24 hr tablet, Take 25 mg by mouth daily., Disp: , Rfl:    modafinil  (PROVIGIL ) 100 MG tablet, Take 1 tablet (100 mg total) by mouth daily. In place of Adderal, Disp: 30 tablet, Rfl: 0   omeprazole  (PRILOSEC) 20 MG capsule, Take 1 capsule (20 mg total) by mouth daily., Disp: 90 capsule, Rfl: 1   potassium gluconate 595 (99 K) MG TABS tablet, Take 1,190 mg by mouth daily., Disp: , Rfl:    sertraline  (ZOLOFT ) 50 MG tablet, Take 1 tablet (50 mg total) by mouth daily., Disp: 90 tablet, Rfl: 0   spironolactone (ALDACTONE) 25 MG tablet, Take 0.5 tablets by mouth daily., Disp: , Rfl:    temazepam  (RESTORIL ) 7.5 MG capsule, Take 1 capsule (7.5 mg total) by mouth at bedtime as needed for sleep.,  Disp: 30 capsule, Rfl: 0   torsemide (DEMADEX) 20 MG tablet, Take by mouth. (Patient taking differently: Take 20 mg by mouth once. Takes as needed), Disp: , Rfl:    torsemide (DEMADEX) 20 MG tablet, Take 40 mg by mouth 2 (two) times daily., Disp: , Rfl:   Allergies  Allergen Reactions   Adhesive [Tape] Other (See Comments)    Blisters/rash  PLEASE USE PAPER TAPE ONLY   Nickel     PT CAN WEAR GOLD, STERLING SILVER  WITH NO PROBLEM-PT HAS NEVER HAD ANY PROBLEMS IN THE OR    I personally reviewed active problem list, medication list, allergies with the patient/caregiver today.   ROS  Ten systems reviewed and is negative except as mentioned in HPI    Objective Physical Exam CONSTITUTIONAL: Patient appears well-developed and well-nourished. No distress. HEENT: Head atraumatic, normocephalic, neck supple. CARDIOVASCULAR: Normal rate, regular rhythm and normal heart sounds. No murmur heard. No BLE edema. Subcutaneous nodule on left lower leg, non-tender, not growing. PULMONARY: Effort normal and breath sounds normal. No respiratory distress. Lungs clear to  auscultation bilaterally. MUSCULOSKELETAL: Normal gait. Without gross motor or sensory deficit. PSYCHIATRIC: Patient has a normal mood and affect. Behavior is normal. Judgment and thought content normal.  Vitals:   07/18/24 1042  BP: 114/76  Pulse: 98  Resp: 16  SpO2: 93%  Weight: 170 lb 6.4 oz (77.3 kg)  Height: 5' 1.5 (1.562 m)    Body mass index is 31.68 kg/m.  Recent Results (from the past 2160 hours)  Cancer antigen 27.29     Status: None   Collection Time: 06/19/24  1:07 PM  Result Value Ref Range   CA 27.29 16.8 0.0 - 38.6 U/mL    Comment: (NOTE) Siemens Centaur Immunochemiluminometric Methodology (ICMA) Values obtained with different assay methods or kits cannot be used interchangeably. Results cannot be interpreted as absolute evidence of the presence or absence of malignant disease. Performed At: [facility] [address] [place], [place] [id number] Jennette Shorter MD Ey:[id number]   Ferritin     Status: None   Collection Time: 06/19/24  1:07 PM  Result Value Ref Range   Ferritin 142 11 - 307 ng/mL    Comment: Performed at [facility], [address] [place], [place] [address]  Iron and TIBC     Status: Abnormal   Collection Time: 06/19/24  1:07 PM  Result Value Ref Range   Iron 145 28 - 170 ug/dL   TIBC 617 749 - 549 ug/dL   Saturation Ratios 38 (H) 10.4 - 31.8 %   UIBC 237 ug/dL    Comment: Performed at [facility], [address] [place], [place] [address]  CMP (Cancer Center only)     Status: None   Collection Time: 06/19/24  1:07 PM  Result Value Ref Range   Sodium 137 135 - 145 mmol/L   Potassium 4.5 3.5 - 5.1 mmol/L   Chloride 101 98 - 111 mmol/L   CO2 29 22 - 32 mmol/L   Glucose, Bld 99 70 - 99 mg/dL    Comment: Glucose reference range applies only to samples taken after fasting for at least 8 hours.   BUN 17 6 - 20 mg/dL   Creatinine 9.20 9.55 - 1.00 mg/dL   Calcium 9.1 8.9 - 89.6 mg/dL   Total Protein 6.6  6.5 - 8.1 g/dL   Albumin 4.0 3.5 - 5.0 g/dL   AST 16 15 - 41 U/L   ALT 16  0 - 44 U/L   Alkaline Phosphatase 98 38 - 126 U/L   Total Bilirubin 0.6 0.0 - 1.2 mg/dL   GFR, Estimated >39 >39 mL/min    Comment: (NOTE) Calculated using the CKD-EPI Creatinine Equation (2021)    Anion gap 7 5 - 15    Comment: Performed at [facility], [address] [place], [place] [address]  CBC with Differential (Cancer Center Only)     Status: None   Collection Time: 06/19/24  1:07 PM  Result Value Ref Range   WBC Count 7.1 4.0 - 10.5 K/uL   RBC 4.94 3.87 - 5.11 MIL/uL   Hemoglobin 14.5 12.0 - 15.0 g/dL   HCT 55.3 63.9 - 53.9 %   MCV 90.3 80.0 - 100.0 fL   MCH 29.4 26.0 - 34.0 pg   MCHC 32.5 30.0 - 36.0 g/dL   RDW 86.2 88.4 - 84.4 %   Platelet Count 273 150 - 400 K/uL   nRBC 0.0 0.0 - 0.2 %   Neutrophils Relative % 64 %   Neutro Abs 4.6 1.7 - 7.7 K/uL   Lymphocytes Relative 24 %   Lymphs Abs 1.7 0.7 - 4.0 K/uL   Monocytes Relative 7 %   Monocytes Absolute 0.5 0.1 - 1.0 K/uL   Eosinophils Relative 4 %   Eosinophils Absolute 0.3 0.0 - 0.5 K/uL   Basophils Relative 1 %   Basophils Absolute 0.1 0.0 - 0.1 K/uL   Immature Granulocytes 0 %   Abs Immature Granulocytes 0.01 0.00 - 0.07 K/uL    Comment: Performed at [facility], [address] [place], [place] [address]    Diabetic Foot Exam:     PHQ2/9:    07/18/2024   10:40 AM 06/19/2024    1:27 PM 04/17/2024    9:40 AM 01/16/2024   10:28 AM 09/06/2023   11:00 AM  Depression screen PHQ 2/9  Decreased Interest 0 0 3 0 0  Down, Depressed, Hopeless 0 0 3 0 1  PHQ - 2 Score 0 0 6 0 1  Altered sleeping 0  1 0 3  Tired, decreased energy 0  1 0 2  Change in appetite 0  1 0 0  Feeling bad or failure about yourself  0  0 0 0  Trouble concentrating 0  1 0 0  Moving slowly or fidgety/restless 0  0 0 0  Suicidal thoughts 0  0 0 0  PHQ-9 Score 0  10 0 6  Difficult doing work/chores Not difficult at all  Somewhat difficult Not  difficult at all Somewhat difficult    phq 9 is negative  Fall Risk:    07/18/2024   10:30 AM 04/17/2024    9:40 AM 01/16/2024   10:23 AM 09/06/2023   10:58 AM 05/05/2023   11:48 AM  Fall Risk   Falls in the past year? 0 1 0 1 0  Number falls in past yr: 0 0 0 0 0  Injury with Fall? 0 1 0 0 0  Risk for fall due to : No Fall Risks Impaired balance/gait No Fall Risks History of fall(s)   Follow up Falls evaluation completed Falls prevention discussed;Education provided;Falls evaluation completed Falls prevention discussed;Education provided;Falls evaluation completed Falls prevention discussed;Education provided;Falls evaluation completed      Assessment & Plan Chronic systolic heart failure due to non-ischemic cardiomyopathy Chronic systolic heart failure with reduced ejection fraction (15-20%) possibly related to chemotherapy or stimulant use. Cardiologist recommended stopping Adderall due  to potential contribution to cardiomyopathy. She agreed to try modafinil  as a milder alternative. - Continue losartan 25 mg daily, spironolactone 25 mg daily, metoprolol succinate 25 mg daily, Jardiance, and torsemide 20 mg as needed. - Discontinue Adderall and start modafinil  since it is a milder form of a stimulant - Follow up with cardiologist for echocardiogram and further evaluation.  Major depressive disorder and attention deficit disorder Depression symptoms include lack of energy and motivation. Adderall may contribute to cardiomyopathy. Modafinil  suggested as an alternative to help with focus and mood without significant cardiac effects. - Start modafinil  in place of Adderall. - Continue sertraline  50 mg daily. - Monitor response to modafinil  and adjust treatment as needed.  Psychophysiological insomnia Chronic insomnia with difficulty falling asleep, possibly due to tolerance to Ambien . Temazepam  suggested as an alternative for sleep. - Discontinue Ambien . - Start temazepam  7.5 mg at  night. - Monitor effectiveness and side effects of temazepam .  Subcutaneous nodule, left lower leg Subcutaneous nodule likely benign fatty tissue. No immediate intervention required unless changes occur. - Monitor the nodule for changes in size or symptoms. - Consider ultrasound if the nodule changes.  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 Currently using albuterol  as needed, primarily for stress-related symptoms. - Continue albuterol  as needed, but use cautiously due to potential cardiac effects. - Reassess need for inhaler therapy if symptoms change.  Follow-Up - Schedule follow-up appointment in one month to evaluate response to modafinil  and temazepam , and to discuss any changes in symptoms or treatment efficacy.

## 2024-07-25 ENCOUNTER — Telehealth: Payer: Self-pay | Admitting: Family Medicine

## 2024-07-25 ENCOUNTER — Telehealth: Payer: Self-pay | Admitting: Pharmacy Technician

## 2024-07-25 ENCOUNTER — Encounter: Payer: Self-pay | Admitting: Internal Medicine

## 2024-07-25 ENCOUNTER — Other Ambulatory Visit (HOSPITAL_COMMUNITY): Payer: Self-pay

## 2024-07-25 NOTE — Telephone Encounter (Signed)
 error

## 2024-07-25 NOTE — Telephone Encounter (Signed)
 Faxing notes to number provided.

## 2024-07-25 NOTE — Telephone Encounter (Signed)
 Pharmacy Patient Advocate Encounter  Received notification from [place] [employer]ICA[employer] that Prior Authorization for Temazepam  7.5MG  capsules  has been DENIED.  Full denial letter will be uploaded to the media tab. See denial reason below.     PA #/Case ID/Reference #: [id number]  Insurance preferred: eszopiclone, flurazepam, ramelton, temazepam  15 mg or 30 mg, zaleplon, and zolpidem 

## 2024-07-25 NOTE — Telephone Encounter (Unsigned)
 Copied from CRM [id number]. Topic: General - Other >> Jul 25, 2024  1:14 PM Zebedee SAUNDERS wrote: Reason for CRM: Received call from [place] per Precious with [place]  medicaid fax: [phone]  regarding temazepam  (RESTORIL ) 7.5 MG capsule, how long has pt had insomnia and depression? Please sent notes.

## 2024-07-25 NOTE — Telephone Encounter (Signed)
 Pharmacy Patient Advocate Encounter   Received notification from Onbase that prior authorization for Temazepam  7.5MG  capsules  is required/requested.   Insurance verification completed.   The patient is insured through [facility] [employer]PLETE HE[employer] MEDICAID .   Per test claim: PA required; PA submitted to above mentioned insurance via Latent Key/confirmation #/EOC [id number] Status is pending

## 2024-07-26 ENCOUNTER — Other Ambulatory Visit (HOSPITAL_COMMUNITY): Payer: Self-pay

## 2024-07-26 ENCOUNTER — Telehealth: Payer: Self-pay

## 2024-07-26 NOTE — Telephone Encounter (Signed)
 Copied from CRM [id number]. Topic: Clinical - Medication Question >> Jul 26, 2024  9:10 AM Myrick T wrote: Reason for CRM: [place] from [facility] called to verify if patient has chronic primary insomnia or 2ndry/comorbid insomnia. [place] will send in a faxed letter to be completed and faxed back

## 2024-07-26 NOTE — Telephone Encounter (Signed)
 Will wait on forms to be received, nothing yet.

## 2024-08-17 ENCOUNTER — Encounter: Payer: Self-pay | Admitting: Family Medicine

## 2024-08-17 ENCOUNTER — Telehealth: Payer: Self-pay | Admitting: Pharmacy Technician

## 2024-08-17 ENCOUNTER — Other Ambulatory Visit: Payer: Self-pay | Admitting: Family Medicine

## 2024-08-17 ENCOUNTER — Other Ambulatory Visit (HOSPITAL_COMMUNITY): Payer: Self-pay

## 2024-08-17 ENCOUNTER — Ambulatory Visit (INDEPENDENT_AMBULATORY_CARE_PROVIDER_SITE_OTHER): Admitting: Family Medicine

## 2024-08-17 VITALS — BP 108/74 | HR 93 | Resp 16 | Ht 61.5 in | Wt 173.5 lb

## 2024-08-17 DIAGNOSIS — J441 Chronic obstructive pulmonary disease with (acute) exacerbation: Secondary | ICD-10-CM | POA: Diagnosis not present

## 2024-08-17 DIAGNOSIS — F5104 Psychophysiologic insomnia: Secondary | ICD-10-CM

## 2024-08-17 DIAGNOSIS — F902 Attention-deficit hyperactivity disorder, combined type: Secondary | ICD-10-CM | POA: Diagnosis not present

## 2024-08-17 DIAGNOSIS — J069 Acute upper respiratory infection, unspecified: Secondary | ICD-10-CM

## 2024-08-17 MED ORDER — LEVALBUTEROL TARTRATE 45 MCG/ACT IN AERO: 2.0000 | INHALATION_SPRAY | Freq: Four times a day (QID) | RESPIRATORY_TRACT | 0 refills | Status: DC

## 2024-08-17 MED ORDER — TEMAZEPAM 7.5 MG PO CAPS: 7.5000 mg | ORAL_CAPSULE | Freq: Every evening | ORAL | 0 refills | Status: DC | PRN

## 2024-08-17 MED ORDER — METHYLPREDNISOLONE 4 MG PO TBPK: ORAL_TABLET | ORAL | 0 refills | Status: DC

## 2024-08-17 MED ORDER — TEMAZEPAM 15 MG PO CAPS: 15.0000 mg | ORAL_CAPSULE | Freq: Every evening | ORAL | 2 refills | Status: DC | PRN

## 2024-08-17 MED ORDER — BENZONATATE 100 MG PO CAPS: 100.0000 mg | ORAL_CAPSULE | Freq: Three times a day (TID) | ORAL | 0 refills | Status: DC | PRN

## 2024-08-17 MED ORDER — MODAFINIL 100 MG PO TABS: 100.0000 mg | ORAL_TABLET | Freq: Every day | ORAL | 0 refills | Status: DC

## 2024-08-17 NOTE — Telephone Encounter (Signed)
 Pharmacy Patient Advocate Encounter   Received notification from CoverMyMeds that prior authorization for Temazepam  7.5MG  capsules is required/requested.   Insurance verification completed.   The patient is insured through [place] [employer]PLE[employer]ICAID .   Per test claim:  See below list for what is preferred by the insurance.  If suggested medication is appropriate, Please send in a new RX and discontinue this one. If not, please advise as to why it's not appropriate so that we may request a Prior Authorization. Please note, some preferred medications may still require a PA.  If the suggested medications have not been trialed and there are no contraindications to their use, the PA will not be submitted, as it will not be approved.

## 2024-08-17 NOTE — Telephone Encounter (Signed)
 Pharmacy Patient Advocate Encounter   Received notification from Onbase that prior authorization for Levalbuterol  Tartrate 45MCG/ACT aerosol is required/requested.   Insurance verification completed.   The patient is insured through [place] COMPLETE MEDICAID .   Per test claim:  BRAND NAME XOPENEX  is preferred by the insurance.  If suggested medication is appropriate, Please send in a new RX and discontinue this one. If not, please advise as to why it's not appropriate so that we may request a Prior Authorization. Please note, some preferred medications may still require a PA.  If the suggested medications have not been trialed and there are no contraindications to their use, the PA will not be submitted, as it will not be approved.  Some medications medicaid prefer name brand because of rebates and this is 1 of those medications. Dr. Glenard will have to write for Xopenex  HFA and sign dispense as written on the prescription, do not sign substitution permitted

## 2024-08-17 NOTE — Telephone Encounter (Signed)
 Pharmacy Patient Advocate Encounter   Received notification from CoverMyMeds that prior authorization for Modafinil  100MG  tablets  is required/requested.   Insurance verification completed.   The patient is insured through [place] COMPLETE MEDICAID .   Per test claim: PA required; PA submitted to above mentioned insurance via Latent Key/confirmation #/EOC [id number] Status is pending

## 2024-08-17 NOTE — Progress Notes (Signed)
 Name: Shelby Barry   MRN: [id number]    DOB: 10/08/1966   Date:08/17/2024       Progress Note  Subjective  Chief Complaint  Chief Complaint  Patient presents with   Medical Management of Chronic Issues    Pt did not start Modafinil  and Temazpam   Discussed the use of AI scribe software for clinical note transcription with the patient, who gave verbal consent to proceed.  History of Present Illness Shelby Barry is a 58 year old female with chronic systolic heart failure and COPD who presents with a persistent cough and medication management issues.  She has a history of heart failure with reduced ejection fraction (15-20%) and non-ischemic cardiomyopathy. Her medication regimen includes torsemide, losartan, spironolactone, metoprolol, and Jardiance. Despite these medications, she experiences persistent shortness of breath and swelling. Adderall was discontinued after her cardiologist recommended stopping it because of non ischemic cardiomyopathy.  She has a history of COPD with asthma and has been experiencing a persistent dry cough for the past week, accompanied by nasal congestion and rhinorrhea. No fever is present, but her sister was recently coughing, which may have contributed to her symptoms. The cough is causing a sore throat, especially at night, and over-the-counter medications like Mucinex have not been effective.  She is facing issues with medication management, particularly with obtaining modafinil  and temazepam  due to insurance coverage problems. She uses albuterol  sulfate for acute symptoms but is concerned about its effects on her heart. She has not received her influenza vaccination yet and is not planning to get it at this time.    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, estrogen receptor positive (HCC) 10/26/2016   Major depression, recurrent, chronic 08/22/2015   Bursitis of shoulder 08/22/2015   Bilateral cataracts 08/22/2015   Circadian rhythm sleep disorder, 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   Gastroesophageal reflux disease without esophagitis 08/22/2015   Vitamin D  deficiency 08/22/2015   Low back pain 08/22/2015   GAD (generalized anxiety disorder) 08/22/2015   History of anemia 08/17/2007   Insomnia 08/16/2007    Past Surgical History:  Procedure Laterality Date   ABDOMINAL HYSTERECTOMY  04/20/2014   BILATERAL SALPINGECTOMY     BIOPSY  08/10/2023   Procedure: BIOPSY;  Surgeon: Therisa Bi, MD;  Location: [facility] ENDOSCOPY;  Service: Gastroenterology;;   BREAST BIOPSY Right 09/27/2016   positive   BREAST LUMPECTOMY Right 10/12/2016   positive   BREAST LUMPECTOMY WITH SENTINEL LYMPH NODE BIOPSY Right 10/12/2016   Procedure: BREAST LUMPECTOMY WITH SENTINEL LYMPH NODE BX;  Surgeon: Louanne KANDICE Muse, MD;  Location: [facility] ORS;  Service: General;  Laterality: Right;   COLONOSCOPY WITH PROPOFOL  N/A 05/20/2016   Procedure: COLONOSCOPY WITH PROPOFOL ;  Surgeon: Rogelia Copping, MD;  [facility];  Service: Endoscopy;  Laterality: N/A;   COLONOSCOPY WITH PROPOFOL  N/A 08/10/2023   Procedure: COLONOSCOPY WITH PROPOFOL ;  Surgeon: Therisa Bi, MD;  Location: [facility];  Service: Gastroenterology;  Laterality: N/A;  ECTOPIC PREGNANCY SURGERY     ENDOMETRIAL ABLATION     ESOPHAGOGASTRODUODENOSCOPY (EGD) WITH PROPOFOL  N/A 08/10/2023   Procedure: ESOPHAGOGASTRODUODENOSCOPY (EGD) WITH  PROPOFOL ;  Surgeon: Therisa Bi, MD;  Location: [facility] ENDOSCOPY;  Service: Gastroenterology;  Laterality: N/A;   POLYPECTOMY  08/10/2023   Procedure: POLYPECTOMY;  Surgeon: Therisa Bi, MD;  [facility] END[facility];  Service: Gastroenterology;;   PORTACATH PLACEMENT Left 11/04/2016   Procedure: INSERTION PORT-A-CATH;  Surgeon: Louanne KANDICE Muse, MD;  Location: [facility] ORS;  Service: General;  Laterality: Left;  Left subclavian vein   TUBAL LIGATION      Family History  Problem Relation Age of Onset   Heart disease Mother    Depression Father    Obesity Father    Diabetes Father    Alcohol abuse Father    COPD Father    Alcohol abuse Brother    Seizures Brother    Breast cancer Sister 75    Social History   Tobacco Use   Smoking status: Former    Current packs/day: 0.00    Average packs/day: 0.5 packs/day for 20.0 years (10.0 ttl pk-yrs)    Types: Cigarettes    Start date: 08/23/1996    Quit date: 08/23/2016    Years since quitting: 7.9   Smokeless tobacco: Never  Substance Use Topics   Alcohol use: Yes    Alcohol/week: 0.0 standard drinks of alcohol    Comment: occassional     Current Outpatient Medications:    acetaminophen  (TYLENOL ) 325 MG tablet, Take 325 mg by mouth every 6 (six) hours as needed for fever., Disp: , Rfl:    benzonatate  (TESSALON ) 100 MG capsule, Take 1-2 capsules (100-200 mg total) by mouth 3 (three) times daily as needed for cough., Disp: 40 capsule, Rfl: 0   busPIRone  (BUSPAR ) 5 MG tablet, Take 1 tablet (5 mg total) by mouth 2 (two) times daily., Disp: 100 tablet, Rfl: 0   empagliflozin (JARDIANCE) 10 MG TABS tablet, Take 10 mg by mouth daily., Disp: , Rfl:    levalbuterol  (XOPENEX  HFA) 45 MCG/ACT inhaler, Inhale 2 puffs into the lungs 4 (four) times daily., Disp: 1 each, Rfl: 0   loratadine  (CLARITIN ) 10 MG tablet, Take 1 tablet (10 mg total) by mouth daily., Disp: 30 tablet, Rfl: 2   losartan (COZAAR) 25 MG tablet, Take 12.5 mg by mouth daily.,  Disp: , Rfl:    magnesium oxide (MAG-OX) 400 (240 Mg) MG tablet, Take 400 mg by mouth daily., Disp: , Rfl:    methylPREDNISolone  (MEDROL  DOSEPAK) 4 MG TBPK tablet, Take by mouth as directed., Disp: 21 tablet, Rfl: 0   metoprolol succinate (TOPROL-XL) 25 MG 24 hr tablet, Take 25 mg by mouth daily., Disp: , Rfl:    omeprazole  (PRILOSEC) 20 MG capsule, Take 1 capsule (20 mg total) by mouth daily., Disp: 90 capsule, Rfl: 1   potassium gluconate 595 (99 K) MG TABS tablet, Take 1,190 mg by mouth daily., Disp: , Rfl:    sertraline  (ZOLOFT ) 50 MG tablet, Take 1 tablet (50 mg total) by mouth daily., Disp: 90 tablet, Rfl: 0   spironolactone (ALDACTONE) 25 MG tablet, Take 0.5 tablets by mouth daily., Disp: , Rfl:    torsemide (DEMADEX) 20 MG tablet, Take 40 mg by mouth 2 (two) times daily., Disp: , Rfl:    modafinil  (PROVIGIL ) 100 MG tablet, Take 1 tablet (100 mg total) by mouth daily. In place of Adderal, Disp: 30 tablet, Rfl: 0   temazepam  (RESTORIL ) 7.5 MG capsule, Take  1 capsule (7.5 mg total) by mouth at bedtime as needed for sleep., Disp: 30 capsule, Rfl: 0  Allergies  Allergen Reactions   Adhesive [Tape] Other (See Comments)    Blisters/rash  PLEASE USE PAPER TAPE ONLY   Nickel     PT CAN WEAR GOLD, STERLING SILVER  WITH NO PROBLEM-PT HAS NEVER HAD ANY PROBLEMS IN THE OR    I personally reviewed active problem list, medication list, allergies with the patient/caregiver today.   ROS  Ten systems reviewed and is negative except as mentioned in HPI    Objective Physical Exam CONSTITUTIONAL: Patient appears well-developed and well-nourished. No distress. HEENT: Head atraumatic, normocephalic, neck supple. CARDIOVASCULAR: Normal rate, regular rhythm and normal heart sounds. No murmur heard. No BLE edema. PULMONARY: Effort normal. Wheezing  and rhonchi present, no crackles.  MUSCULOSKELETAL: Normal gait. Without gross motor or sensory deficit. PSYCHIATRIC: Patient has a normal mood and  affect. Behavior is normal. Judgment and thought content normal.  Vitals:   08/17/24 1305  BP: 108/74  Pulse: 93  Resp: 16  SpO2: 94%  Weight: 173 lb 8 oz (78.7 kg)  Height: 5' 1.5 (1.562 m)    Body mass index is 32.25 kg/m.  Recent Results (from the past 2160 hours)  Cancer antigen 27.29     Status: None   Collection Time: 06/19/24  1:07 PM  Result Value Ref Range   CA 27.29 16.8 0.0 - 38.6 U/mL    Comment: (NOTE) Siemens Centaur Immunochemiluminometric Methodology (ICMA) Values obtained with different assay methods or kits cannot be used interchangeably. Results cannot be interpreted as absolute evidence of the presence or absence of malignant disease. Performed At: [facility] [address] [place], [place] [id number] Jennette Shorter MD Ey:[id number]   Ferritin     Status: None   Collection Time: 06/19/24  1:07 PM  Result Value Ref Range   Ferritin 142 11 - 307 ng/mL    Comment: Performed at [facility], [address] [place], [place] [address]  Iron and TIBC     Status: Abnormal   Collection Time: 06/19/24  1:07 PM  Result Value Ref Range   Iron 145 28 - 170 ug/dL   TIBC 617 749 - 549 ug/dL   Saturation Ratios 38 (H) 10.4 - 31.8 %   UIBC 237 ug/dL    Comment: Performed at [facility], [address] [place], [place] [address]  CMP (Cancer Center only)     Status: None   Collection Time: 06/19/24  1:07 PM  Result Value Ref Range   Sodium 137 135 - 145 mmol/L   Potassium 4.5 3.5 - 5.1 mmol/L   Chloride 101 98 - 111 mmol/L   CO2 29 22 - 32 mmol/L   Glucose, Bld 99 70 - 99 mg/dL    Comment: Glucose reference range applies only to samples taken after fasting for at least 8 hours.   BUN 17 6 - 20 mg/dL   Creatinine 9.20 9.55 - 1.00 mg/dL   Calcium 9.1 8.9 - 89.6 mg/dL   Total Protein 6.6 6.5 - 8.1 g/dL   Albumin 4.0 3.5 - 5.0 g/dL   AST 16 15 - 41 U/L   ALT 16 0 - 44 U/L   Alkaline Phosphatase 98 38 - 126 U/L   Total  Bilirubin 0.6 0.0 - 1.2 mg/dL   GFR, Estimated >39 >39 mL/min    Comment: (NOTE) Calculated using the CKD-EPI Creatinine Equation (2021)    Anion gap  7 5 - 15    Comment: Performed at [facility], [address] [place], [place] [address]  CBC with Differential (Cancer Center Only)     Status: None   Collection Time: 06/19/24  1:07 PM  Result Value Ref Range   WBC Count 7.1 4.0 - 10.5 K/uL   RBC 4.94 3.87 - 5.11 MIL/uL   Hemoglobin 14.5 12.0 - 15.0 g/dL   HCT 55.3 63.9 - 53.9 %   MCV 90.3 80.0 - 100.0 fL   MCH 29.4 26.0 - 34.0 pg   MCHC 32.5 30.0 - 36.0 g/dL   RDW 86.2 88.4 - 84.4 %   Platelet Count 273 150 - 400 K/uL   nRBC 0.0 0.0 - 0.2 %   Neutrophils Relative % 64 %   Neutro Abs 4.6 1.7 - 7.7 K/uL   Lymphocytes Relative 24 %   Lymphs Abs 1.7 0.7 - 4.0 K/uL   Monocytes Relative 7 %   Monocytes Absolute 0.5 0.1 - 1.0 K/uL   Eosinophils Relative 4 %   Eosinophils Absolute 0.3 0.0 - 0.5 K/uL   Basophils Relative 1 %   Basophils Absolute 0.1 0.0 - 0.1 K/uL   Immature Granulocytes 0 %   Abs Immature Granulocytes 0.01 0.00 - 0.07 K/uL    Comment: Performed at [facility], [address] [place], [place] [address]    Diabetic Foot Exam:     PHQ2/9:    08/17/2024    1:00 PM 07/18/2024   10:40 AM 06/19/2024    1:27 PM 04/17/2024    9:40 AM 01/16/2024   10:28 AM  Depression screen PHQ 2/9  Decreased Interest 0 0 0 3 0  Down, Depressed, Hopeless 0 0 0 3 0  PHQ - 2 Score 0 0 0 6 0  Altered sleeping 0 0  1 0  Tired, decreased energy 0 0  1 0  Change in appetite 0 0  1 0  Feeling bad or failure about yourself  0 0  0 0  Trouble concentrating 0 0  1 0  Moving slowly or fidgety/restless 0 0  0 0  Suicidal thoughts 0 0  0 0  PHQ-9 Score 0 0  10 0  Difficult doing work/chores Not difficult at all Not difficult at all  Somewhat difficult Not difficult at all    phq 9 is negative  Fall Risk:    08/17/2024    1:00 PM 07/18/2024   10:30 AM 04/17/2024    9:40  AM 01/16/2024   10:23 AM 09/06/2023   10:58 AM  Fall Risk   Falls in the past year? 0 0 1 0 1  Number falls in past yr: 0 0 0 0 0  Injury with Fall? 0 0 1 0 0  Risk for fall due to : No Fall Risks No Fall Risks Impaired balance/gait No Fall Risks History of fall(s)  Follow up Falls evaluation completed Falls evaluation completed Falls prevention discussed;Education provided;Falls evaluation completed Falls prevention discussed;Education provided;Falls evaluation completed Falls prevention discussed;Education provided;Falls evaluation completed     Assessment & Plan Chronic systolic heart failure due to non-ischemic cardiomyopathy Chronic systolic heart failure with reduced ejection fraction (15-20%) due to non-ischemic cardiomyopathy. Adderall discontinued to prevent exacerbation. Modafinil  prescribed but not filled due to insurance issues. - Resend prescription for modafinil  to [facility]. - Advise use of GoodRx if insurance denies coverage.  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 No  bacterial infection indicated. - Prescribe Tessalon  Perles for cough. - Prescribe Xopenex  as an alternative to albuterol  due to non ischemic cardiomyopathy  - Prescribe prednisone  (Medrol  dose pack).  Acute cough and upper respiratory symptoms, likely viral Acute cough and upper respiratory symptoms likely viral. No antibiotics needed. - Prescribe Tessalon  Perles for cough. - Prescribe prednisone  (Medrol  dose pack).  Insomnia Insomnia likely psychophysiological. Temazepam  not filled due to insurance. Ambien  ineffective due to coverage issues. - Advise paying cash for temazepam  using GoodRx. - Resend prescription for temazepam  to [facility].

## 2024-08-20 ENCOUNTER — Other Ambulatory Visit (HOSPITAL_COMMUNITY): Payer: Self-pay

## 2024-08-20 NOTE — Telephone Encounter (Signed)
 Pharmacy Patient Advocate Encounter  Received notification from [place] [employer] that Prior Authorization for Modafinil  100MG  tablets  has been DENIED.  Full denial letter will be uploaded to the media tab. See denial reason below.     PA #/Case ID/Reference #: [id number]

## 2024-09-06 ENCOUNTER — Ambulatory Visit: Payer: Self-pay

## 2024-09-06 ENCOUNTER — Ambulatory Visit: Admitting: Internal Medicine

## 2024-09-06 NOTE — Telephone Encounter (Signed)
 FYI Only or Action Required?: Action required by provider: request for appointment and clinical question for provider.  Patient was last seen in primary care on 08/17/2024 by Shelby Mire, MD.  Called Nurse Triage reporting Respiratory Distress.  Symptoms began several days ago.  Interventions attempted: Nothing.  Symptoms are: gradually worsening.  Triage Disposition: Go to ED Now (Notify PCP)  Patient/caregiver understands and will follow disposition?: Copied from CRM [id number]. Topic: Clinical - Red Word Triage >> Sep 06, 2024  9:41 AM Avram MATSU wrote: Red Word that prompted transfer to Nurse Triage: yellow to dark green mucus, having a heard time breathing. Reason for Disposition  [1] MODERATE difficulty breathing (e.g., speaks in phrases, SOB even at rest, pulse 100-120) AND [2] NEW-onset or WORSE than normal  Answer Assessment - Initial Assessment Questions Offered appt 1040, patient declines and states too far to drive to and too tired to drive, nurse offered to call 911, pt declines. Nurse advised ED. Patient reports will go to [facility]; nurse again advised nearest ED.  Patient has audible pausing while speaking.   Patient requests prednisone  and reports seen in clinic 3 weeks ago for bronchitis and symptoms are worse.  1. RESPIRATORY STATUS: Describe your breathing? (e.g., wheezing, shortness of breath, unable to speak, severe coughing)      3 weeks ago bronchitis, trouble breathing chest and nasal congestion, wheezing, Coughing green phlegm Denies fever, chills, n/v  2. ONSET: When did this breathing problem begin?      3 weeks ago, took steroids did not clear 3. PATTERN Does the difficult breathing come and go, or has it been constant since it started?     Comes and goes with exertion and talking or coughing 4. SEVERITY: How bad is your breathing? (e.g., mild, moderate, severe)      Moderate to severe 5. RECURRENT SYMPTOM: Have you had difficulty  breathing before? If Yes, ask: When was the last time? and What happened that time?      yes 6. CARDIAC HISTORY: Do you have any history of heart disease? (e.g., heart attack, angina, bypass surgery, angioplasty)      chf 7. LUNG HISTORY: Do you have any history of lung disease?  (e.g., pulmonary embolus, asthma, emphysema)     copd 8. CAUSE: What do you think is causing the breathing problem?      bronchitis 9. OTHER SYMPTOMS: Do you have any other symptoms? (e.g., chest pain, cough, dizziness, fever, runny nose)     Chest pain/tightness from breathing, denies dizziness/ weakness 10. O2 SATURATION MONITOR:  Do you use an oxygen saturation monitor (pulse oximeter) at home? If Yes, ask: What is your reading (oxygen level) today? What is your usual oxygen saturation reading? (e.g., 95%)       Not have L: Have you traveled out of the country in the last month? (e.g., travel history, exposures)       no  Protocols used: Breathing Difficulty-A-AH

## 2024-10-15 ENCOUNTER — Other Ambulatory Visit: Payer: Self-pay | Admitting: Family Medicine

## 2024-10-15 DIAGNOSIS — F5104 Psychophysiologic insomnia: Secondary | ICD-10-CM

## 2024-10-16 ENCOUNTER — Other Ambulatory Visit (HOSPITAL_BASED_OUTPATIENT_CLINIC_OR_DEPARTMENT_OTHER): Payer: Self-pay

## 2024-10-16 NOTE — Telephone Encounter (Signed)
 Requested medications are due for refill today.  no  Requested medications are on the active medications list.  no  Last refill. 04/17/2024   Future visit scheduled.   no  Notes to clinic. Refill/refusal not delegated. Medication d/c'd.      Requested Prescriptions  Pending Prescriptions Disp Refills   zolpidem  (AMBIEN ) 5 MG tablet [Pharmacy Med Name: Zolpidem  Tartrate 5 MG Oral Tablet] 15 tablet 0    Sig: TAKE 1 TABLET BY MOUTH AT BEDTIME AS NEEDED FOR SLEEP     Not Delegated - Psychiatry:  Anxiolytics/Hypnotics Failed - 10/16/2024  3:21 PM      Failed - This refill cannot be delegated      Failed - Urine Drug Screen completed in last 360 days      Passed - Valid encounter within last 6 months    Recent Outpatient Visits           2 months ago Asthma exacerbation with COPD (chronic obstructive pulmonary disease) (HCC)   [place] [facility] Glenard Mire, MD   3 months ago Non-ischemic [facility])   [place] [facility] Mire, MD   6 months ago COPD with asthma [facility])   [facility] Glen[facility] Mire, MD   9 months ago Non-ischemic cardiomyopathy [facility])   [place] [facility] Sowles, [name], MD

## 2024-10-24 ENCOUNTER — Other Ambulatory Visit: Payer: Self-pay | Admitting: Family Medicine

## 2024-10-24 ENCOUNTER — Telehealth: Payer: Self-pay | Admitting: Family Medicine

## 2024-10-24 DIAGNOSIS — J441 Chronic obstructive pulmonary disease with (acute) exacerbation: Secondary | ICD-10-CM

## 2024-10-24 DIAGNOSIS — F5104 Psychophysiologic insomnia: Secondary | ICD-10-CM

## 2024-10-24 NOTE — Telephone Encounter (Signed)
 Copied from CRM [id number]. Topic: Clinical - Medication Refill >> Oct 24, 2024  4:54 PM Delon T wrote: Medication:  Zolpidem  Tartrate 5 mg  temazepam  (RESTORIL ) 7.5 MG capsule levalbuterol  (XOPENEX  HFA) 45 MCG/ACT inhaler   Has the patient contacted their pharmacy? Yes (Agent: If no, request that the patient contact the pharmacy for the refill. If patient does not wish to contact the pharmacy document the reason why and proceed with request.) (Agent: If yes, when and what did the pharmacy advise?)  This is the patient's preferred pharmacy:  [facility] [address] [place] - [address] [place] [address] Phone: [phone] Fax: [phone]    Is this the correct pharmacy for this prescription? Yes If no, delete pharmacy and type the correct one.   Has the prescription been filled recently? Yes  Is the patient out of the medication? Yes  Has the patient been seen for an appointment in the last year OR does the patient have an upcoming appointment? Yes  Can we respond through MyChart? Yes  Agent: Please be advised that Rx refills may take up to 3 business days. We ask that you follow-up with your pharmacy.

## 2024-10-25 NOTE — Telephone Encounter (Signed)
 Unable to pend Ambien 

## 2024-10-27 NOTE — Telephone Encounter (Signed)
 Requested medication (s) are due for refill today: routing for review  Requested medication (s) are on the active medication list: no  Last refill:  07/18/24  Future visit scheduled: no  Notes to clinic:  Unable to refill per protocol, cannot delegate. Routing for review      Requested Prescriptions  Pending Prescriptions Disp Refills   zolpidem  (AMBIEN ) 5 MG tablet [Pharmacy Med Name: Zolpidem  Tartrate 5 MG Oral Tablet] 15 tablet 0    Sig: TAKE 1 TABLET BY MOUTH AT BEDTIME AS NEEDED FOR SLEEP     Not Delegated - Psychiatry:  Anxiolytics/Hypnotics Failed - 10/27/2024  8:37 AM      Failed - This refill cannot be delegated      Failed - Urine Drug Screen completed in last 360 days      Passed - Valid encounter within last 6 months    Recent Outpatient Visits           2 months ago Asthma exacerbation with COPD (chronic obstructive pulmonary disease) (HCC)   [place] [facility] Glenard Mire, MD   3 months ago Non-ischemic cardiomyopathy [facility])   [place] [facility] Glenard Mire, MD   6 months ago COPD with asthma [facility])   [facility] Glenard Mire, MD   9 months ago Non-ischemic cardiomyopathy [facility])   [place] [facility] Sowles, [name], MD

## 2024-10-27 NOTE — Telephone Encounter (Signed)
 Requested medication (s) are due for refill today: yes  Requested medication (s) are on the active medication list: yes  Last refill:  08/17/24  Future visit scheduled: no  Notes to clinic:  Unable to refill per protocol, cannot delegate.      Requested Prescriptions  Pending Prescriptions Disp Refills   temazepam  (RESTORIL ) 15 MG capsule 30 capsule 2    Sig: Take 1 capsule (15 mg total) by mouth at bedtime as needed for sleep.     Not Delegated - Psychiatry: Anxiolytics/Hypnotics 2 Failed - 10/27/2024  8:55 AM      Failed - This refill cannot be delegated      Failed - Urine Drug Screen completed in last 360 days      Passed - Patient is not pregnant      Passed - Valid encounter within last 6 months    Recent Outpatient Visits           2 months ago Asthma exacerbation with COPD (chronic obstructive pulmonary disease) (HCC)   [place] [facility] Glenard Mire, MD   3 months ago Non-ischemic cardiomyopathy [facility])   [place] [facility] Glen[facility] Mire, MD   6 months ago COPD with asthma [facility])   [place] [facility] Glen[facility] Mire, MD   9 months ago Non-ischemic cardiomyopathy [facility])   [place] [facility] [place], [name], MD               levalbuterol  (XOPENEX  HFA) 45 MCG/ACT inhaler 1 each 0    Sig: Inhale 2 puffs into the lungs 4 (four) times daily.     Pulmonology:  Beta Agonists 2 Passed - 10/27/2024  8:55 AM      Passed - Last BP in normal range    BP Readings from Last 1 Encounters:  08/17/24 108/74         Passed - Last Heart Rate in normal range    Pulse Readings from Last 1 Encounters:  08/17/24 93         Passed - Valid encounter within last 12 months    Recent Outpatient Visits           2 months ago Asthma exacerbation with COPD (chronic obstructive pulmonary disease) (HCC)   [place] [facility] Glen[facility] Mire, MD   3 months ago Non-ischemic  cardiomyopathy [facility])   [place] [facility] Glen[facility] Mire, MD   6 months ago COPD with asthma [facility])   [facility] Sowles, [name], MD   9 months ago Non-ischemic cardiomyopathy [facility])   [facility] Sowles, [name], MD

## 2024-10-29 NOTE — Telephone Encounter (Signed)
 Lvm asking pt to return call to schedule appt

## 2024-10-29 NOTE — Telephone Encounter (Signed)
Needs f/u for refills

## 2024-11-02 ENCOUNTER — Other Ambulatory Visit: Payer: Self-pay | Admitting: Family Medicine

## 2024-11-02 DIAGNOSIS — J441 Chronic obstructive pulmonary disease with (acute) exacerbation: Secondary | ICD-10-CM

## 2024-11-02 NOTE — Telephone Encounter (Unsigned)
 Copied from CRM #[id number]. Topic: Clinical - Medication Refill >> Nov 02, 2024  2:10 PM Fonda T wrote: Medication: levalbuterol  (XOPENEX  HFA) 45 MCG/ACT inhaler   Has the patient contacted their pharmacy? Yes, advised to contact office.   This is the patient's preferred pharmacy:  [facility] [address], [place] - [address] [place] [address] Phone: [phone] Fax: [phone]    Is this the correct pharmacy for this prescription? Yes If no, delete pharmacy and type the correct one.   Has the prescription been filled recently? Yes  Is the patient out of the medication? Yes  Has the patient been seen for an appointment in the last year OR does the patient have an upcoming appointment? Yes  Can we respond through MyChart? Yes  Agent: Please be advised that Rx refills may take up to 3 business days. We ask that you follow-up with your pharmacy.

## 2024-11-05 ENCOUNTER — Ambulatory Visit: Admitting: Family Medicine

## 2024-11-05 ENCOUNTER — Encounter: Payer: Self-pay | Admitting: Family Medicine

## 2024-11-05 VITALS — BP 118/74 | HR 94 | Resp 16 | Ht 61.5 in | Wt 177.2 lb

## 2024-11-05 DIAGNOSIS — I428 Other cardiomyopathies: Secondary | ICD-10-CM | POA: Diagnosis not present

## 2024-11-05 DIAGNOSIS — F902 Attention-deficit hyperactivity disorder, combined type: Secondary | ICD-10-CM

## 2024-11-05 DIAGNOSIS — F339 Major depressive disorder, recurrent, unspecified: Secondary | ICD-10-CM

## 2024-11-05 DIAGNOSIS — F5104 Psychophysiologic insomnia: Secondary | ICD-10-CM

## 2024-11-05 DIAGNOSIS — R490 Dysphonia: Secondary | ICD-10-CM

## 2024-11-05 DIAGNOSIS — J4489 Other specified chronic obstructive pulmonary disease: Secondary | ICD-10-CM | POA: Diagnosis not present

## 2024-11-05 DIAGNOSIS — K2100 Gastro-esophageal reflux disease with esophagitis, without bleeding: Secondary | ICD-10-CM | POA: Diagnosis not present

## 2024-11-05 DIAGNOSIS — I5022 Chronic systolic (congestive) heart failure: Secondary | ICD-10-CM

## 2024-11-05 MED ORDER — TEMAZEPAM 15 MG PO CAPS: 15.0000 mg | ORAL_CAPSULE | Freq: Every evening | ORAL | 0 refills | Status: AC | PRN

## 2024-11-05 MED ORDER — OMEPRAZOLE 20 MG PO CPDR: 20.0000 mg | DELAYED_RELEASE_CAPSULE | Freq: Every day | ORAL | 1 refills | Status: AC

## 2024-11-05 MED ORDER — MODAFINIL 100 MG PO TABS: 100.0000 mg | ORAL_TABLET | Freq: Every day | ORAL | 0 refills | Status: AC

## 2024-11-05 MED ORDER — BUSPIRONE HCL 5 MG PO TABS: 5.0000 mg | ORAL_TABLET | Freq: Two times a day (BID) | ORAL | 0 refills | Status: DC

## 2024-11-05 MED ORDER — SERTRALINE HCL 50 MG PO TABS: 50.0000 mg | ORAL_TABLET | Freq: Every day | ORAL | 1 refills | Status: AC

## 2024-11-05 MED ORDER — LEVALBUTEROL TARTRATE 45 MCG/ACT IN AERO: 2.0000 | INHALATION_SPRAY | Freq: Four times a day (QID) | RESPIRATORY_TRACT | 1 refills | Status: AC

## 2024-11-05 MED ORDER — MONTELUKAST SODIUM 10 MG PO TABS: 10.0000 mg | ORAL_TABLET | Freq: Every day | ORAL | 1 refills | Status: AC

## 2024-11-05 NOTE — Progress Notes (Signed)
 Name: Shelby Barry   MRN: [id number]    DOB: 1966-04-02   Date:11/05/2024       Progress Note  Subjective  Chief Complaint  Chief Complaint  Patient presents with   Medical Management of Chronic Issues   Discussed the use of AI scribe software for clinical note transcription with the patient, who gave verbal consent to proceed.  History of Present Illness Shelby Barry is a 58 year old female with chronic systolic congestive heart failure and COPD who presents with medication refill requests.  She has run out of her medications, including Ambien  and inhalers, and is unable to obtain a three-month supply due to Medicaid restrictions. She has not received temazepam  before and is considering trying it for sleep issues. She previously received zolpidem  (Ambien ) in October 2024, but only a limited supply of fifteen pills.  She is currently taking sertraline  50 mg for major depression and anxiety, but has been out of it for a while, she continues to feel overwhelmed and down due to stress.   She also takes omeprazole  20 mg daily for reflux and reports no heartburn or indigestion when taking it regularly.  She has chronic systolic congestive heart failure and is on multiple medications including spironolactone, torsemide, and metoprolol. She stated she is not sure if she is  currently taking losartan. She mentions a recent cardiology appointment and a stress test performed today. She states Entresto caused her to not be able to speak  For her COPD with asthma, she requires an inhaler and mentions a recent stress test after which a doctor in [place] discussed the possibility of exercise-induced asthma. She has used albuterol  in the past but prefers Xopenex  due to its lesser impact on heart rate. She experiences shortness of breath during fluid buildup or stress.  She has a history of ADD and previously took Adderall, but now uses modafinil  due to her cardiomyopathy. She has not received  modafinil  recently and is considering a 90-day supply through [facility].  She has Medicaid but notes that it does not cover all her medications, leading her to consider paying cash for some prescriptions. She is currently unemployed and is trying to sign up for employment.    Patient Active Problem List   Diagnosis Date Noted   Chronic systolic congestive heart failure (HCC) 07/18/2024   Non-ischemic cardiomyopathy (HCC) 03/28/2024   Heart failure with reduced ejection fraction (HCC) 10/16/2023   Absolute anemia 08/10/2023   Hyperplastic polyp of intestine 08/10/2023   COPD with asthma (HCC) 02/01/2023   Attention deficit hyperactivity disorder (ADHD), combined type 02/01/2023   Asthma, well controlled 02/01/2023   Obesity (BMI 30.0-34.9) 05/18/2018   Liver lesion 11/09/2016   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, estrogen receptor positive (HCC) 10/26/2016   Major depression, recurrent, chronic 08/22/2015   Bursitis of shoulder 08/22/2015   Bilateral cataracts 08/22/2015   Circadian rhythm sleep disorder, 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   Gastroesophageal reflux disease without esophagitis 08/22/2015   Vitamin D  deficiency 08/22/2015   Low back pain 08/22/2015   GAD (generalized anxiety disorder) 08/22/2015   History of anemia 08/17/2007   Insomnia 08/16/2007    Past Surgical History:  Procedure Laterality Date   ABDOMINAL HYSTERECTOMY  04/20/2014   BILATERAL SALPINGECTOMY     BIOPSY  08/10/2023   Procedure: BIOPSY;  Surgeon: Therisa Bi, MD;  Location: [facility] ENDOSCOPY;  Service: Gastroenterology;;   BREAST BIOPSY Right 09/27/2016   positive   BREAST LUMPECTOMY  Right 10/12/2016   positive   BREAST LUMPECTOMY WITH SENTINEL LYMPH NODE BIOPSY Right 10/12/2016   Procedure: BREAST LUMPECTOMY WITH SENTINEL LYMPH NODE BX;  Surgeon: Louanne KANDICE Muse, MD;  Location: [facility] ORS;  Service: General;  Laterality: Right;   COLONOSCOPY WITH PROPOFOL  N/A 05/20/2016    Procedure: COLONOSCOPY WITH PROPOFOL ;  Surgeon: Rogelia Copping, MD;  [facility];  Service: Endoscopy;  Laterality: N/A;   COLONOSCOPY WITH PROPOFOL  N/A 08/10/2023   Procedure: COLONOSCOPY WITH PROPOFOL ;  Surgeon: Therisa Bi, MD;  Location: [facility] ENDOSCOPY;  Service: Gastroenterology;  Laterality: N/A;   ECTOPIC PREGNANCY SURGERY     ENDOMETRIAL ABLATION     ESOPHAGOGASTRODUODENOSCOPY (EGD) WITH PROPOFOL  N/A 08/10/2023   Procedure: ESOPHAGOGASTRODUODENOSCOPY (EGD) WITH PROPOFOL ;  Surgeon: Therisa Bi, MD;  Location: [facility] ENDOSCOPY;  Service: Gastroenterology;  Laterality: N/A;   POLYPECTOMY  08/10/2023   Procedure: POLYPECTOMY;  Surgeon: Therisa Bi, MD;  Location: [facility];  Service: Gastroenterology;;   PORTACATH PLACEMENT Left 11/04/2016   Procedure: INSERTION PORT-A-CATH;  Surgeon: Louanne KANDICE Muse, MD;  Location: [facility] ORS;  Service: General;  Laterality: Left;  Left subclavian vein   TUBAL LIGATION      Family History  Problem Relation Age of Onset   Heart disease Mother    Depression Father    Obesity Father    Diabetes Father    Alcohol abuse Father    COPD Father    Alcohol abuse Brother    Seizures Brother    Breast cancer Sister 19    Social History   Tobacco Use   Smoking status: Former    Current packs/day: 0.00    Average packs/day: 0.5 packs/day for 20.0 years (10.0 ttl pk-yrs)    Types: Cigarettes    Start date: 08/23/1996    Quit date: 08/23/2016    Years since quitting: 8.2   Smokeless tobacco: Never  Substance Use Topics   Alcohol use: Yes    Alcohol/week: 0.0 standard drinks of alcohol    Comment: occassional    Current Medications[1]  Allergies[2]  I personally reviewed active problem list, medication list, allergies with the patient/caregiver today.   ROS  Ten systems reviewed and is negative except as mentioned in HPI    Objective Physical Exam CONSTITUTIONAL: Patient appears well-developed and well-nourished.  No  distress. HEENT: Head atraumatic, normocephalic, neck supple. CARDIOVASCULAR: Normal rate, regular rhythm and normal heart sounds.  No murmur heard. No BLE edema. PULMONARY: Effort normal and breath sounds normal. No respiratory distress. ABDOMINAL: There is no tenderness or distention. MUSCULOSKELETAL: Normal gait. Without gross motor or sensory deficit. PSYCHIATRIC: Patient is fidgety and appears anxious/baseline   Vitals:   11/05/24 1358  BP: 118/74  Pulse: 94  Resp: 16  SpO2: 97%  Weight: 177 lb 3.2 oz (80.4 kg)  Height: 5' 1.5 (1.562 m)    Body mass index is 32.94 kg/m.    PHQ2/9:    11/05/2024    1:53 PM 08/17/2024    1:00 PM 07/18/2024   10:40 AM 06/19/2024    1:27 PM 04/17/2024    9:40 AM  Depression screen PHQ 2/9  Decreased Interest 0 0 0 0 3  Down, Depressed, Hopeless 0 0 0 0 3  PHQ - 2 Score 0 0 0 0 6  Altered sleeping  0 0  1  Tired, decreased energy  0 0  1  Change in appetite  0 0  1  Feeling bad or failure about yourself   0 0  0  Trouble concentrating  0 0  1  Moving slowly or fidgety/restless  0 0  0  Suicidal thoughts  0 0  0  PHQ-9 Score  0  0   10   Difficult doing work/chores  Not difficult at all Not difficult at all  Somewhat difficult     Data saved with a previous flowsheet row definition    phq 9 is negative  Fall Risk:    11/05/2024    1:53 PM 08/17/2024    1:00 PM 07/18/2024   10:30 AM 04/17/2024    9:40 AM 01/16/2024   10:23 AM  Fall Risk   Falls in the past year? 0 0 0 1 0  Number falls in past yr: 0 0 0 0 0  Injury with Fall? 0 0  0  1  0   Risk for fall due to : No Fall Risks No Fall Risks No Fall Risks Impaired balance/gait No Fall Risks  Follow up Falls evaluation completed Falls evaluation completed Falls evaluation completed Falls prevention discussed;Education provided;Falls evaluation completed Falls prevention discussed;Education provided;Falls evaluation completed     Data saved with a previous flowsheet row definition       Assessment & Plan Psychophysiologic insomnia Chronic insomnia managed with temazepam  due to cost and insurance coverage issues with zolpidem . - Prescribed temazepam  90-day supply with [facility] for cost management.  Major depressive disorder, recurrent Recurrent major depressive disorder managed with sertraline  50 mg. Current dose effective. - Prescribed sertraline  50 mg 90-day supply with [facility] for cost management.  Attention-deficit hyperactivity disorder, combined type Switched from Adderall to modafinil  due to cardiomyopathy concerns. Not currently taking modafinil  due to prescription issues. - Prescribed modafinil  90-day supply with [facility] for cost management.  Chronic systolic heart failure with non-ischemic cardiomyopathy Managed with spironolactone, torsemide, metoprolol, and Entresto. Recent cardiology follow-up and stress test conducted. - Continue current heart failure medications as per cardiologist's recommendations.  COPD with asthma Managed with Xopenex  inhaler. Reports exercise-induced asthma symptoms. No current use of maintenance inhalers with corticosteroids. Montelukast  prescribed for asthma management. - Prescribed Xopenex  inhaler for acute symptoms. - Prescribed montelukast  for asthma management.  Gastroesophageal reflux disease with esophagitis GERD with esophagitis managed with omeprazole . Reports no current heartburn or indigestion. - Prescribed omeprazole  20 mg daily.  General health maintenance Mammogram is due in January. - Schedule mammogram for January.        [1]  Current Outpatient Medications:    acetaminophen  (TYLENOL ) 325 MG tablet, Take 325 mg by mouth every 6 (six) hours as needed for fever., Disp: , Rfl:    benzonatate  (TESSALON ) 100 MG capsule, Take 1-2 capsules (100-200 mg total) by mouth 3 (three) times daily as needed for cough., Disp: 40 capsule, Rfl: 0   busPIRone  (BUSPAR ) 5 MG tablet, Take 1 tablet (5 mg total) by mouth 2  (two) times daily., Disp: 100 tablet, Rfl: 0   empagliflozin (JARDIANCE) 10 MG TABS tablet, Take 10 mg by mouth daily., Disp: , Rfl:    levalbuterol  (XOPENEX  HFA) 45 MCG/ACT inhaler, Inhale 2 puffs into the lungs 4 (four) times daily., Disp: 1 each, Rfl: 0   loratadine  (CLARITIN ) 10 MG tablet, Take 1 tablet (10 mg total) by mouth daily., Disp: 30 tablet, Rfl: 2   losartan (COZAAR) 25 MG tablet, Take 12.5 mg by mouth daily., Disp: , Rfl:    magnesium oxide (MAG-OX) 400 (240 Mg) MG tablet, Take 400 mg by mouth daily., Disp: , Rfl:    methylPREDNISolone  (  MEDROL  DOSEPAK) 4 MG TBPK tablet, Take by mouth as directed., Disp: 21 tablet, Rfl: 0   metoprolol succinate (TOPROL-XL) 25 MG 24 hr tablet, Take 25 mg by mouth daily., Disp: , Rfl:    modafinil  (PROVIGIL ) 100 MG tablet, Take 1 tablet (100 mg total) by mouth daily. In place of Adderal, Disp: 30 tablet, Rfl: 0   omeprazole  (PRILOSEC) 20 MG capsule, Take 1 capsule (20 mg total) by mouth daily., Disp: 90 capsule, Rfl: 1   potassium gluconate 595 (99 K) MG TABS tablet, Take 1,190 mg by mouth daily., Disp: , Rfl:    sertraline  (ZOLOFT ) 50 MG tablet, Take 1 tablet (50 mg total) by mouth daily., Disp: 90 tablet, Rfl: 0   spironolactone (ALDACTONE) 25 MG tablet, Take 0.5 tablets by mouth daily., Disp: , Rfl:    temazepam  (RESTORIL ) 15 MG capsule, Take 1 capsule (15 mg total) by mouth at bedtime as needed for sleep., Disp: 30 capsule, Rfl: 2   torsemide (DEMADEX) 20 MG tablet, Take 40 mg by mouth 2 (two) times daily., Disp: , Rfl:    zolpidem  (AMBIEN ) 5 MG tablet, Take 1 tablet by mouth., Disp: , Rfl:  [2]  Allergies Allergen Reactions   Adhesive [Tape] Other (See Comments)    Blisters/rash  PLEASE USE PAPER TAPE ONLY   Nickel     PT CAN WEAR GOLD, STERLING SILVER  WITH NO PROBLEM-PT HAS NEVER HAD ANY PROBLEMS IN THE OR

## 2024-11-07 ENCOUNTER — Other Ambulatory Visit (HOSPITAL_COMMUNITY): Payer: Self-pay

## 2024-11-16 ENCOUNTER — Other Ambulatory Visit: Payer: Self-pay | Admitting: Family Medicine

## 2024-11-16 DIAGNOSIS — Z1231 Encounter for screening mammogram for malignant neoplasm of breast: Secondary | ICD-10-CM

## 2024-12-01 ENCOUNTER — Other Ambulatory Visit: Payer: Self-pay | Admitting: Family Medicine

## 2024-12-01 DIAGNOSIS — F339 Major depressive disorder, recurrent, unspecified: Secondary | ICD-10-CM

## 2024-12-03 NOTE — Telephone Encounter (Signed)
 Requested Prescriptions  Pending Prescriptions Disp Refills   busPIRone  (BUSPAR ) 5 MG tablet [Pharmacy Med Name: busPIRone  HCl 5 MG Oral Tablet] 200 tablet 1    Sig: Take 1 tablet by mouth twice daily     Psychiatry: Anxiolytics/Hypnotics - Non-controlled Passed - 12/03/2024  3:09 PM      Passed - Valid encounter within last 12 months    Recent Outpatient Visits           4 weeks ago Chronic systolic congestive heart failure [facility])   [facility] Glenard Mire, MD   3 months ago Asthma exacerbation with COPD (chronic obstructive pulmonary disease) [facility])   [place] Glenard Mire, MD   4 months ago Non-ischemic cardiomyopathy [facility])   [facility] Sowles, [name], MD   7 months ago COPD with asthma [facility])   [facility] Glenard Mire, MD   10 months ago Non-ischemic cardiomyopathy [facility])   [facility] Sowles, [name], MD

## 2024-12-05 ENCOUNTER — Telehealth: Payer: Self-pay

## 2024-12-05 NOTE — Telephone Encounter (Signed)
 Copied from CRM [id number]. Topic: Clinical - Medication Question >> Dec 05, 2024 12:09 PM Burnard DEL wrote: Reason for CRM: Patient was prescribed a different sleeping medicationtemazepam (RESTORIL ) 15 MG capsule when she was at her OV on 11/05/2024. Patient called in today stating that it doesn't help her and would like to know if she could be placed back on Ambien . She stated that the medication helps her sleep but not rest.She stated that she's dreaming all night which causes her to feel exhausted when she wakes up.

## 2024-12-05 NOTE — Telephone Encounter (Signed)
 Teed up

## 2024-12-24 ENCOUNTER — Ambulatory Visit

## 2024-12-28 ENCOUNTER — Ambulatory Visit: Admission: RE | Admit: 2024-12-28

## 2024-12-28 DIAGNOSIS — Z1231 Encounter for screening mammogram for malignant neoplasm of breast: Secondary | ICD-10-CM

## 2025-02-13 ENCOUNTER — Encounter: Admitting: Nurse Practitioner
